# Patient Record
Sex: Female | Born: 1970 | Race: White | Hispanic: No | Smoking: Current every day smoker
Health system: Southern US, Community
[De-identification: ages and names within clinical notes are randomized; demographics above are authoritative.]

## PROBLEM LIST (undated history)

## (undated) ENCOUNTER — Inpatient Hospital Stay (HOSPITAL_COMMUNITY): Payer: Self-pay

## (undated) DIAGNOSIS — E119 Type 2 diabetes mellitus without complications: Secondary | ICD-10-CM

## (undated) DIAGNOSIS — B009 Herpesviral infection, unspecified: Secondary | ICD-10-CM

## (undated) DIAGNOSIS — I10 Essential (primary) hypertension: Secondary | ICD-10-CM

## (undated) DIAGNOSIS — E7800 Pure hypercholesterolemia, unspecified: Secondary | ICD-10-CM

## (undated) DIAGNOSIS — I219 Acute myocardial infarction, unspecified: Secondary | ICD-10-CM

## (undated) DIAGNOSIS — R768 Other specified abnormal immunological findings in serum: Secondary | ICD-10-CM

## (undated) DIAGNOSIS — F419 Anxiety disorder, unspecified: Secondary | ICD-10-CM

## (undated) DIAGNOSIS — F17200 Nicotine dependence, unspecified, uncomplicated: Secondary | ICD-10-CM

## (undated) DIAGNOSIS — F329 Major depressive disorder, single episode, unspecified: Secondary | ICD-10-CM

## (undated) HISTORY — DX: Acute myocardial infarction, unspecified: I21.9

## (undated) HISTORY — DX: Pure hypercholesterolemia, unspecified: E78.00

## (undated) HISTORY — PX: FOOT SURGERY: SHX648

## (undated) HISTORY — DX: Nicotine dependence, unspecified, uncomplicated: F17.200

## (undated) HISTORY — DX: Anxiety disorder, unspecified: F41.9

## (undated) HISTORY — PX: BLADDER SUSPENSION: SHX72

## (undated) HISTORY — DX: Major depressive disorder, single episode, unspecified: F32.9

## (undated) HISTORY — PX: CORONARY ANGIOPLASTY WITH STENT PLACEMENT: SHX49

## (undated) HISTORY — DX: Essential (primary) hypertension: I10

## (undated) HISTORY — DX: Herpesviral infection, unspecified: B00.9

## (undated) HISTORY — DX: Other specified abnormal immunological findings in serum: R76.8

## (undated) HISTORY — DX: Type 2 diabetes mellitus without complications: E11.9

## (undated) HISTORY — PX: CERVICAL FUSION: SHX112

---
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
## 2004-08-30 HISTORY — PX: CHOLECYSTECTOMY: SHX55

## 2009-12-12 ENCOUNTER — Ambulatory Visit: Payer: Self-pay | Admitting: Family Medicine

## 2009-12-12 DIAGNOSIS — I10 Essential (primary) hypertension: Secondary | ICD-10-CM

## 2009-12-12 DIAGNOSIS — F3289 Other specified depressive episodes: Secondary | ICD-10-CM

## 2009-12-12 DIAGNOSIS — F329 Major depressive disorder, single episode, unspecified: Secondary | ICD-10-CM

## 2009-12-12 DIAGNOSIS — E1159 Type 2 diabetes mellitus with other circulatory complications: Secondary | ICD-10-CM | POA: Insufficient documentation

## 2009-12-12 DIAGNOSIS — S93409A Sprain of unspecified ligament of unspecified ankle, initial encounter: Secondary | ICD-10-CM | POA: Insufficient documentation

## 2009-12-12 DIAGNOSIS — F339 Major depressive disorder, recurrent, unspecified: Secondary | ICD-10-CM | POA: Insufficient documentation

## 2009-12-12 DIAGNOSIS — F17200 Nicotine dependence, unspecified, uncomplicated: Secondary | ICD-10-CM | POA: Insufficient documentation

## 2009-12-12 HISTORY — DX: Other specified depressive episodes: F32.89

## 2009-12-12 HISTORY — DX: Nicotine dependence, unspecified, uncomplicated: F17.200

## 2009-12-12 HISTORY — DX: Major depressive disorder, single episode, unspecified: F32.9

## 2009-12-12 HISTORY — DX: Essential (primary) hypertension: I10

## 2009-12-15 ENCOUNTER — Telehealth: Payer: Self-pay | Admitting: Family Medicine

## 2010-01-12 ENCOUNTER — Ambulatory Visit: Payer: Self-pay | Admitting: Family Medicine

## 2010-01-16 ENCOUNTER — Ambulatory Visit: Payer: Self-pay | Admitting: Family Medicine

## 2010-01-16 LAB — CONVERTED CEMR LAB
ALT: 18 units/L (ref 0–35)
Albumin: 3.3 g/dL — ABNORMAL LOW (ref 3.5–5.2)
Alkaline Phosphatase: 85 units/L (ref 39–117)
BUN: 12 mg/dL (ref 6–23)
Bilirubin Urine: NEGATIVE
CO2: 27 meq/L (ref 19–32)
Direct LDL: 140.6 mg/dL
Eosinophils Absolute: 0.2 10*3/uL (ref 0.0–0.7)
Eosinophils Relative: 2.8 % (ref 0.0–5.0)
GFR calc non Af Amer: 103.29 mL/min (ref >60)
Glucose, Urine, Semiquant: NEGATIVE
HDL: 21.5 mg/dL — ABNORMAL LOW (ref >39.00)
Lymphocytes Relative: 28.6 % (ref 12.0–46.0)
Lymphs Abs: 2.1 10*3/uL (ref 0.7–4.0)
Monocytes Absolute: 0.5 10*3/uL (ref 0.1–1.0)
Neutro Abs: 4.4 10*3/uL (ref 1.4–7.7)
Neutrophils Relative %: 60.2 % (ref 43.0–77.0)
Nitrite: NEGATIVE
Potassium: 4.1 meq/L (ref 3.5–5.1)
Protein, U semiquant: NEGATIVE
RBC: 4.46 M/uL (ref 3.87–5.11)
RDW: 13.8 % (ref 11.5–14.6)
Sodium: 140 meq/L (ref 135–145)
TSH: 1.7 microintl units/mL (ref 0.35–5.50)
WBC Urine, dipstick: NEGATIVE

## 2010-02-11 ENCOUNTER — Other Ambulatory Visit: Admission: RE | Admit: 2010-02-11 | Discharge: 2010-02-11 | Payer: Self-pay | Admitting: Family Medicine

## 2010-02-11 ENCOUNTER — Ambulatory Visit: Payer: Self-pay | Admitting: Family Medicine

## 2010-02-11 DIAGNOSIS — R7309 Other abnormal glucose: Secondary | ICD-10-CM | POA: Insufficient documentation

## 2010-02-18 LAB — CONVERTED CEMR LAB: Pap Smear: NEGATIVE

## 2010-02-20 ENCOUNTER — Telehealth: Payer: Self-pay | Admitting: Family Medicine

## 2010-03-06 ENCOUNTER — Telehealth: Payer: Self-pay | Admitting: Family Medicine

## 2010-03-30 ENCOUNTER — Ambulatory Visit: Payer: Self-pay | Admitting: Family Medicine

## 2010-03-30 DIAGNOSIS — N920 Excessive and frequent menstruation with regular cycle: Secondary | ICD-10-CM | POA: Insufficient documentation

## 2010-03-30 LAB — CONVERTED CEMR LAB
Blood Glucose, Fingerstick: 121
Hemoglobin: 15.4 g/dL

## 2010-04-03 ENCOUNTER — Encounter: Admission: RE | Admit: 2010-04-03 | Discharge: 2010-04-03 | Payer: Self-pay | Admitting: Family Medicine

## 2010-04-07 ENCOUNTER — Telehealth: Payer: Self-pay | Admitting: Family Medicine

## 2010-04-08 ENCOUNTER — Ambulatory Visit: Payer: Self-pay | Admitting: Family Medicine

## 2010-04-08 DIAGNOSIS — R1084 Generalized abdominal pain: Secondary | ICD-10-CM | POA: Insufficient documentation

## 2010-04-08 LAB — CONVERTED CEMR LAB
Bilirubin Urine: NEGATIVE
Specific Gravity, Urine: 1.025
Urobilinogen, UA: 0.2

## 2010-04-09 LAB — CONVERTED CEMR LAB
Basophils Absolute: 0 10*3/uL (ref 0.0–0.1)
Lymphocytes Relative: 32 % (ref 12.0–46.0)
Lymphs Abs: 2.2 10*3/uL (ref 0.7–4.0)
MCHC: 34.4 g/dL (ref 30.0–36.0)
Monocytes Absolute: 0.5 10*3/uL (ref 0.1–1.0)
Neutro Abs: 4 10*3/uL (ref 1.4–7.7)
Neutrophils Relative %: 58.1 % (ref 43.0–77.0)
Platelets: 205 10*3/uL (ref 150.0–400.0)
RDW: 13.7 % (ref 11.5–14.6)

## 2010-04-13 ENCOUNTER — Telehealth: Payer: Self-pay | Admitting: Family Medicine

## 2010-04-13 ENCOUNTER — Ambulatory Visit: Payer: Self-pay | Admitting: Family Medicine

## 2010-05-13 ENCOUNTER — Ambulatory Visit: Payer: Self-pay | Admitting: Family Medicine

## 2010-05-15 ENCOUNTER — Ambulatory Visit: Payer: Self-pay | Admitting: Family Medicine

## 2010-05-15 DIAGNOSIS — B009 Herpesviral infection, unspecified: Secondary | ICD-10-CM

## 2010-05-15 HISTORY — DX: Herpesviral infection, unspecified: B00.9

## 2010-05-15 LAB — CONVERTED CEMR LAB: Blood Glucose, Fingerstick: 129

## 2010-08-17 ENCOUNTER — Ambulatory Visit: Payer: Self-pay | Admitting: Family Medicine

## 2010-09-29 NOTE — Progress Notes (Signed)
Summary: abd cramps  Phone Note Call from Patient   Summary of Call: Abdominal cramps continue & are worse today.  No bleeding.  Feels like sever menstrual cramps hip to hip & pelvic area.  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  Use OTC pain med & ice pack as needed.  ER or UC this pm as needed worsening symptoms.   Initial call taken by: Rudy Jew, RN,  April 07, 2010 3:08 PM

## 2010-09-29 NOTE — Progress Notes (Signed)
Summary: xray results.  Phone Note Call from Patient   Caller: Patient Call For: Evelena Peat MD Summary of Call: Pt is calling for xray results. [phone] Initial call taken by: Lynann Beaver CMA,  December 15, 2009 10:48 AM  Follow-up for Phone Call        see comments after x-ray report. Follow-up by: Evelena Peat MD,  December 15, 2009 1:13 PM  Additional Follow-up for Phone Call Additional follow up Details #1::        Pt given results and Dr. Lucie Leather recommendations. Additional Follow-up by: Lynann Beaver CMA,  December 15, 2009 1:22 PM

## 2010-09-29 NOTE — Progress Notes (Signed)
Summary: concerned about BS  Phone Note Call from Patient   Caller: Patient Call For: Lisa Peat MD Summary of Call: Discussed with pt ways to get BS under control  with diet, exercise and life style changes.  BS has run as high as 179. Initial call taken by: Lynann Beaver CMA,  March 06, 2010 10:03 AM  Follow-up for Phone Call        have pt return for repeat fasting CBG within 2-3 months. Follow-up by: Lisa Peat MD,  March 06, 2010 10:30 AM  Additional Follow-up for Phone Call Additional follow up Details #1::        Pt has appt 04/2010. Additional Follow-up by: Lynann Beaver CMA,  March 06, 2010 11:40 AM

## 2010-09-29 NOTE — Assessment & Plan Note (Signed)
Summary: FLU SHOT//ALP  Nurse Visit   Allergies: No Known Drug Allergies  Orders Added: 1)  Admin 1st Vaccine [90471] 2)  Flu Vaccine 34yrs + [45409]        Flu Vaccine Consent Questions     Do you have a history of severe allergic reactions to this vaccine? no    Any prior history of allergic reactions to egg and/or gelatin? no    Do you have a sensitivity to the preservative Thimersol? no    Do you have a past history of Guillan-Barre Syndrome? no    Do you currently have an acute febrile illness? no    Have you ever had a severe reaction to latex? no    Vaccine information given and explained to patient? yes    Are you currently pregnant? no    Lot Number:[id number]   Exp Date:02/27/2011   Site Given  Left Deltoid IM

## 2010-09-29 NOTE — Progress Notes (Signed)
Summary: LMTCB for appt.  Phone Note Call from Patient Call back at [facility] Phone [phone]   Caller: Patient Call For: Evelena Peat MD Summary of Call: Pt is calling to let Dr.Burchette know she is having abdomial pain.  Called back to schedule an appt but no answer.  Left message. [phone] Initial call taken by: Lynann Beaver CMA,  April 13, 2010 8:42 AM  Follow-up for Phone Call        Pt called back and I sch her an ov to see Dr. Caryl Never, for today 04/13/10 at 4:00pm as noted above. That was the earliest the pt could come in for appt.   Follow-up by: Lucy Antigua,  April 13, 2010 9:38 AM

## 2010-09-29 NOTE — Assessment & Plan Note (Signed)
Summary: 1 month follow up/cjr   Vital Signs:  Patient profile:   40 year old female Menstrual status:  irregular Weight:      242 pounds Temp:     98.4 degrees F oral BP sitting:   110 / 68  (left arm) Cuff size:   large  Vitals Entered By: Sid Falcon LPN (Jan 12, 2010 10:16 AM) CC: one month follow-up   History of Present Illness: Patient seen for followup. History of hypertension and we stopped her lisinopril last visit. Blood pressures have maintained good control.  Other issues recent right ankle sprain. This is improving slowly. X-rays unremarkable. Still some mild edema. No limp. Using ace wrap for stability.  Patient has not had complete physical examination in several years. She has questions today regarding lap band surgery.  Allergies (verified): No Known Drug Allergies  Past History:  Past Medical History: Last updated: 12/12/2009 Depression Hypertension  Physical Exam  General:  Well-developed,well-nourished,in no acute distress; alert,appropriate and cooperative throughout examination Mouth:  Oral mucosa and oropharynx without lesions or exudates.  Teeth in good repair. Neck:  No deformities, masses, or tenderness noted. Lungs:  Normal respiratory effort, chest expands symmetrically. Lungs are clear to auscultation, no crackles or wheezes. Heart:  Normal rate and regular rhythm. S1 and S2 normal without gallop, murmur, click, rub or other extra sounds. Extremities:  R ankle full ROM and nontender.  No edema.   Impression & Recommendations:  Problem # 1:  HYPERTENSION (ICD-401.9) stable off medication. Continue to observe.  Work on weight loss. The following medications were removed from the medication list:    Lisinopril 5 Mg Tabs (Lisinopril) ..... Once daily  Problem # 2:  ANKLE SPRAIN (ICD-845.00) Assessment: Improved she had some slow gradual improvement. This should continue to heal without complication.  Problem # 3:  Preventive Health Care  (ICD-V70.0) needs CPE and will schedule.  Complete Medication List: 1)  Chantix Starting Month Pak 0.5 Mg X 11 & 1 Mg X 42 Tabs (Varenicline tartrate) .... Use as directed. 2)  Chantix Continuing Month Pak 1 Mg Tabs (Varenicline tartrate) .... Use as directed  Patient Instructions: 1)  scheduled complete physical examination.

## 2010-09-29 NOTE — Assessment & Plan Note (Signed)
Summary: ABD CRAMPS/PS   Vital Signs:  Patient profile:   40 year old female Menstrual status:  irregular LMP:     03/09/2010 Weight:      233 pounds Temp:     98.1 degrees F oral Pulse rate:   72 / minute BP sitting:   110 / 72  (left arm) Cuff size:   large  Vitals Entered By: Romualdo Bolk, CMA (AAMA) (April 08, 2010 9:54 AM) CC: Abdominal cramps from hip to hip. Hurts to breathe. Pt has been on cycle 7/11 and stopped on 8/6. Pt slept on a heating pad last night and has red spots where the heating pad was., Abdominal Pain LMP (date): 03/09/2010     Enter LMP: 03/09/2010 Last PAP Result NEGATIVE FOR INTRAEPITHELIAL LESIONS OR MALIGNANCY.   History of Present Illness:       This is a 40 year old woman who presents with Abdominal Pain.  The patient reports anorexia, but denies nausea, vomiting, diarrhea, constipation, melena, and hematochezia.  The location of the pain is right lower quadrant, left lower quadrant, and suprapubic.  The pain is described as constant and sharp.  The patient denies the following symptoms: fever, weight loss, dysuria, dark urine, and missed menstrual period.  The pain is worse with lying down and jarring of the abdomen.    Onset Saturday.  worse with deep breathing and direct pressure.  Tylenol without relief.  No urinary symptoms.  Pain 10/10 severity.  Recent menometrorrhagia resolved at this time.  Ultrasound was basically normal.  Prob sec to anovulatory cycles.  Preventive Screening-Counseling & Management  Alcohol-Tobacco     Alcohol drinks/day: 0     Smoking Status: current     Packs/Day: 0.5     Year Started: 1986  Caffeine-Diet-Exercise     Caffeine use/day: less than 1 a day     Does Patient Exercise: yes  Current Medications (verified): 1)  Chantix Starting Month Pak 0.5 Mg X 11 & 1 Mg X 42 Tabs (Varenicline Tartrate) .... Use As Directed. 2)  Chantix Continuing Month Pak 1 Mg Tabs (Varenicline Tartrate) .... 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  Teeth in good repair. Lungs:  Normal respiratory effort, chest expands symmetrically. Lungs are clear to auscultation, no crackles or wheezes. Heart:  normal rate and regular rhythm.   Abdomen:  soft, normal bowel sounds, no distention, no masses, no rigidity, no rebound tenderness, no hepatomegaly, and no splenomegaly.  mild to moderate tender R and L lower quadrant. Skin:  no rashes.     Impression & Recommendations:  Problem # 1:  ABDOMINAL PAIN, GENERALIZED (ICD-789.07) Assessment New check labs as below.  No real localizing pain at this time.  she will watch for fever or localizing pain. Orders: UA Dipstick w/o Micro (manual) (47829) Specimen Handling (99000) Venipuncture (56213) TLB-CBC Platelet - w/Differential (85025-CBCD)  Complete Medication List: 1)  Chantix Starting Month Pak 0.5 Mg X 11 & 1 Mg X 42  Tabs (Varenicline tartrate) .... Use as directed. 2)  Chantix Continuing Month Pak 1 Mg Tabs (Varenicline tartrate) .... Use as directed  Patient Instructions: 1)  Call or follow up immediately for fever, vomiting, localizing pain.  Laboratory Results   Urine Tests    Routine Urinalysis   Color: yellow Appearance: Clear Glucose: negative   (Normal Range: Negative) Bilirubin: negative   (Normal Range: Negative) Ketone: negative   (Normal Range: Negative) Spec. Gravity: 1.025   (Normal Range: 1.003-1.035) Blood: 1+   (Normal Range: Negative) pH: 5.0   (Normal Range: 5.0-8.0) Protein: negative   (Normal Range: Negative) Urobilinogen: 0.2   (Normal Range: 0-1) Nitrite: negative   (Normal Range: Negative) Leukocyte Esterace: negative   (Normal Range: Negative)    Comments: Rita Ohara  April 08, 2010 1:44 PM

## 2010-09-29 NOTE — Assessment & Plan Note (Signed)
Summary: BRAND NEW PT/TO EST/?FRACTURE OF FOOT/PER DR Rayann Jolley/CJR   Vital Signs:  Patient profile:   40 year old female Menstrual status:  irregular LMP:     06/30/2009 Height:      62.50 inches Weight:      239 pounds BMI:     43.17 Temp:     98.2 degrees F oral Pulse rate:   80 / minute Pulse rhythm:   regular Resp:     12 per minute BP sitting:   110 / 70  (left arm) Cuff size:   large  Vitals Entered By: Sid Falcon LPN (December 12, 2009 1:20 PM)  Nutrition Counseling: Patient's BMI is greater than 25 and therefore counseled on weight management options. CC: New to establish, right anlke inj 1 month ago, Hypertension Management LMP (date): 06/30/2009     Menstrual Status irregular Enter LMP: 06/30/2009   History of Present Illness: New patient to establish care.  Patient has history of hypertension had been treated with low-dose lisinopril 5 mg daily but ran out some time ago blood pressures been stable. Denies any recent dizziness palpitation or chest pain. Occasional headaches.  Prior history of depression. Currently off medication. Depression symptoms stable.  New problem of right ankle injury approximately one and 1/2 months ago. Coming off a step with an inversion injury to ankle. Initially had some swelling and bruising. Bruising has resolved but has some tenderness mostly medial aspect of ankle now. Still has some mild leg edema. No significant instability. No prior history of ankle surgery.  Past medical history otherwise significant for cholecystectomy 2006 and adenoidectomy 1988. No known drug allergies.  Family history significant for parents with hyperlipidemia. Grandparent with stroke.  Smokes one half pack cigarettes per day. Desires to quit. Has questions regarding Chantix. Tried Wellbutrin in the past without much success. No alcohol use. 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  She notes no problems with any antihypertensive medication side effects.        Positive major cardiovascular risk factors include hypertension and current tobacco user.  Negative major cardiovascular risk factors include female age less than 30 years old.     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 Alcohol use-no Regular exercise-yes one pregnancy, ond live birth  Risk Factors: Exercise: yes (12/12/2009)  Risk Factors: Smoking Status: current (12/12/2009) Packs/Day: 1.5 (12/12/2009)  Past Medical History: Depression Hypertension  Past Surgical History: Cholecystectomy  2006 Tonsillectomy  1988  Family History: Family History High cholesterol Stroke  Social History: Occupation:  Psychologist, [employer] Married Current Smoker Alcohol use-no Regular exercise-yes one pregnancy, ond live birth Occupation:  employed Smoking Status:  current Does Patient Exercise:  yes Packs/Day:  1.5  Review of Systems  The patient denies anorexia, fever, weight loss, weight gain, chest pain, syncope, dyspnea on exertion, peripheral edema, prolonged cough, headaches, hemoptysis, abdominal pain, melena, hematochezia, severe indigestion/heartburn, incontinence, muscle weakness, and difficulty walking.    Physical  Exam  General:  Well-developed,well-nourished,in no acute distress; alert,appropriate and cooperative throughout examination Head:  Normocephalic and atraumatic without  obvious abnormalities. No apparent alopecia or balding. Eyes:  pupils equal, pupils round, and pupils reactive to light.   Ears:  External ear exam shows no significant lesions or deformities.  Otoscopic examination reveals clear canals, tympanic membranes are intact bilaterally without bulging, retraction, inflammation or discharge. Hearing is grossly normal bilaterally. Mouth:  Oral mucosa and oropharynx without lesions or exudates.  Teeth in good repair. Neck:  No deformities, masses, or tenderness noted. Lungs:  Normal respiratory effort, chest expands symmetrically. Lungs are clear to auscultation, no crackles or wheezes. Heart:  Normal rate and regular rhythm. S1 and S2 normal without gallop, murmur, click, rub or other extra sounds. Pulses:  R dorsalis pedis normal and L dorsalis pedis normal.   Extremities:  no peripheral edema noted. Right ankle and foot examined. She has some tenderness near the attachment of the posterior tibial tendon but no distal tibia or fibula tenderness. Good range of motion ankle. No other bony tenderness. No Achilles tenderness. Achilles Tendon fully intact Neurologic:  alert & oriented X3 and strength normal in all extremities.   Skin:  Intact without suspicious lesions or rashes Cervical Nodes:  No lymphadenopathy noted Psych:  normally interactive, good eye contact, not anxious appearing, and not depressed appearing.     Impression & Recommendations:  Problem # 1:  HYPERTENSION (ICD-401.9) Assessment Unchanged  Her updated medication list for this problem includes:    Lisinopril 5 Mg Tabs (Lisinopril) ..... Once daily  Problem # 2:  ANKLE SPRAIN (ICD-845.00) Assessment: New ?post tibial tendon strain.  No signs of instability.  Obtain plain films. Orders: Ace Wraps 3-5 in/yard  (Z6109) T-Ankle Comp Right (73610TC)  Problem # 3:  TOBACCO ABUSE (ICD-305.1)  discussed smoking cessation strategies. Patient interested in trial of Chantix and this is  prescribed pt knows to f/u immediately if any depressive symptoms.  Her updated medication list for this problem includes:    Chantix Starting Month Pak 0.5 Mg X 11 & 1 Mg X 42 Tabs (Varenicline tartrate) ..... Use as directed.    Chantix Continuing Month Pak 1 Mg Tabs (Varenicline tartrate) ..... Use as directed  Problem # 4:  DEPRESSION (ICD-311) Assessment: Unchanged hx of.  Currently stable.  Complete Medication List: 1)  Lisinopril 5 Mg Tabs (Lisinopril) .... Once daily 2)  Chantix Starting Month Pak 0.5 Mg X 11 & 1 Mg X 42 Tabs (Varenicline tartrate) .... Use as directed. 3)  Chantix Continuing Month Pak 1 Mg Tabs (Varenicline tartrate) .... Use as directed  Hypertension Assessment/Plan:      The patient's hypertensive risk group is category B: At least one risk factor (excluding diabetes) with no target organ damage.  Today's blood pressure is 110/70.    Patient Instructions: 1)  Please schedule a follow-up appointment in 1 month.  2)  Stop smoking tips: Choose a quit date. Cut down before the quit date. Decide what you will do as a substitute when you feel the urge to smoke(gum, toothpick, exercise).  Prescriptions: CHANTIX CONTINUING MONTH PAK 1 MG TABS (VARENICLINE TARTRATE) use as directed  #1 x 1   Entered and Authorized by:   Evelena Peat MD   Signed by:   Evelena Peat MD on 12/12/2009   Method used:   Electronically to        [facility] [phone]* (retail)       [address] [place], [place]  [address]       Ph: [phone] or [phone]       Fax: [phone]   RxID:   [id number] CHANTIX STARTING MONTH PAK 0.5 MG X 11 & 1 MG X 42 TABS (VARENICLINE TARTRATE) use as directed.  #1 x 0   Entered and Authorized by:   Evelena Peat MD   Signed by:   Evelena Peat MD on 12/12/2009   Method used:   Electronically to        [facility] [phone]* (retail)       [address] [place], [place]  [address]       Ph: [phone] or [phone]       Fax: [phone]   RxID:   [id number]

## 2010-09-29 NOTE — Assessment & Plan Note (Signed)
Summary: 3 month rov/pt will come in fasting/njr/PT RESCD//CCM   Vital Signs:  Patient profile:   40 year old female Menstrual status:  irregular Weight:      234 pounds Temp:     98.2 degrees F oral BP sitting:   140 / 80  (left arm) Cuff size:   large  Vitals Entered By: Sid Falcon LPN (May 15, 2010 10:11 AM) CBG Result 129   History of Present Illness: Here for follow up.  Abd pain resolved from last visit.  Never took Bentyl. BP borderline control at times.  Has been trying to lose weight.  Glucose in prediabetes range recently. Had been losing weight but compliance has fallen off recently. No symptoms of hyperglycemia.  Menses regular at this time.  She has hx of amenorrhea. We had discussed possible use of Provera if recurrent heavy and prolonged bleed. Recent ultrasound no signif abnormality.  Recent cold sores on lips. Would like to consider Valtrex for early treatment.  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    Physical  Exam  General:  Well-developed,well-nourished,in no acute distress; alert,appropriate and cooperative throughout examination Mouth:  Oral mucosa and oropharynx without lesions or exudates.  Teeth in good repair. Neck:  No deformities, masses, or tenderness noted. Lungs:  Normal respiratory effort, chest expands symmetrically. Lungs are clear to auscultation, no crackles or wheezes. Heart:  Normal rate and regular rhythm. S1 and S2 normal without gallop, murmur, click, rub or other extra sounds. Abdomen:  soft and non-tender.   Extremities:  no edema.   Impression & Recommendations:  Problem # 1:  PREDIABETES (ICD-790.29) pt needs to cont to lose some weight.  CBG 129.  Will plan repeat in 3 months and A1C then. Start metformin at that time if no further improvement.  Home glucose monitor is given with instructions for use.  Problem # 2:  HYPERTENSION (ICD-401.9)  Problem # 3:  COLD SORE (ICD-054.9) Valtrex for use on as needed basis.  Problem # 4:  ABDOMINAL PAIN, GENERALIZED (ICD-789.07) Assessment: Improved  Problem # 5:  MENORRHAGIA (ICD-626.2) Assessment: Improved  Complete Medication List: 1)  Chantix Starting Month Pak 0.5 Mg X 11 & 1 Mg X 42 Tabs (Varenicline tartrate) .... Use as directed. 2)  Chantix Continuing Month Pak 1 Mg Tabs (Varenicline tartrate) .... Use as directed 3)  Bentyl 20 Mg Tabs (Dicyclomine hcl) .... One by mouth two times a day as needed abdominal pain and cramping 4)  Valtrex 1 Gm Tabs (Valacyclovir hcl) .... Use as directed as needed cold sores. 5)  Onetouch Ultra System W/device Kit (Blood glucose monitoring suppl) .... Dispensed 05/15/2010 use daily as directed 6)  Onetouch Ultrasoft Lancets Misc (Lancets) .... Use daily + as needed 7)  Onetouch Ultra Blue  Strp (Glucose blood) .... Use daily + as needed  Other Orders: Capillary Blood Glucose/CBG (66440)  Patient Instructions: 1)  It is important that you exercise reguarly at least 20 minutes 5  times a week. If you develop chest pain, have severe difficulty breathing, or feel very tired, stop exercising immediately and seek medical attention.  2)  You need to lose weight. Consider a lower calorie diet and regular exercise.  3)  Take Valtrex 1gm 2 at onset of cold sore and repeat 2 in 12 hours. 4)  Please schedule a follow-up appointment in 3 months .  Prescriptions: ONETOUCH ULTRA BLUE  STRP (GLUCOSE BLOOD) use daily + as needed  #50 x 1   Entered by:   Sid Falcon LPN   Authorized by:   Evelena Peat MD   Signed by:   Sid Falcon LPN on 34/74/2595   Method used:   Electronically to        [facility] [phone]* (retail)       [address] [place], [place]  [address]       Ph: [phone] or [phone]       Fax: [phone]   RxID:   [id number] [employer] LANCETS  MISC (LANCETS) use daily as directed  #50 x 1   Entered by:   Sid Falcon LPN   Authorized by:   Evelena Peat MD   Signed by:   Sid Falcon LPN on 17/61/6073   Method used:   Electronically to        [facility] [phone]* (retail)       [address] [place], [place]  [address]       Ph: [phone] or [phone]       Fax: [phone]   RxID:   [id number] VALTREX 1 GM TABS (VALACYCLOVIR HCL) use as directed as needed cold sores.  #30 x 1   Entered and Authorized by:   Evelena Peat MD   Signed by:   Evelena Peat MD on 05/15/2010   Method used:   Print then Give to Patient   RxID:   [id number]

## 2010-09-29 NOTE — Assessment & Plan Note (Signed)
Summary: abdominal pain/cjr   Vital Signs:  Patient profile:   40 year old female Menstrual status:  irregular Temp:     98.0 degrees F oral BP sitting:   118 / 72  (left arm) Cuff size:   large  Vitals Entered By: Sid Falcon LPN (April 13, 2010 4:25 PM) CC: abd pain ongoing   History of Present Illness: Persistent lower abdominal pain which is diffuse and intermittent. Refer to previous note.  Pain is dull ache to sharp and severe at times. Recent CBC normal.  Had recent pelvic ultrasound basically normal.  No urinary symptoms and no fever.  Does have some loose stools but no bloody stools and no  diarrhea.  Pain is worse with standing. No radiation to back.  No appetite change. Losing some weight due to her efforts.  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  Teeth in good repair. Lungs:  Normal respiratory effort, chest expands symmetrically. Lungs are clear to auscultation, no crackles or wheezes. Heart:  normal rate and regular rhythm.   Abdomen:  minimal tender R and  L lower quadrants. No guarding or rebound.  no hepatomegaly.soft, normal bowel sounds, no distention, no masses, and no rigidity.   Skin:  no rashes.     Impression & Recommendations:  Problem # 1:  ABDOMINAL PAIN, GENERALIZED (ICD-789.07) no acute abdomen.  Recent CBC unremarkable.  Trial of Bentyl and consider CT of abdomen if no better in next couple of days.  Complete Medication List: 1)  Chantix Starting Month Pak 0.5 Mg X 11 & 1 Mg X 42 Tabs (Varenicline tartrate) .... Use as directed. 2)  Chantix Continuing Month Pak 1 Mg Tabs (Varenicline tartrate) .... Use as directed 3)  Bentyl 20 Mg Tabs (Dicyclomine hcl) .... One by mouth two times a day as needed abdominal pain and cramping  Patient Instructions: 1)  Follow up promptly if you have any fever, vomiting, or worsening pain Prescriptions: BENTYL 20 MG TABS (DICYCLOMINE HCL) one by mouth two times a day as needed abdominal pain and cramping  #20 x 0   Entered and Authorized by:   Evelena Peat MD   Signed by:   Evelena Peat MD on 04/13/2010   Method used:   Electronically to        [facility] [phone]* (retail)       [address] [place], [place]  [address]       Ph: [phone] or [phone]       Fax: [phone]   RxID:   [id number]

## 2010-09-29 NOTE — Assessment & Plan Note (Signed)
Summary: cpx/pap/breast exam/njr----PT Lisa Norton // RS pt rsc/njr   Vital Signs:  Patient profile:   40 year old female Menstrual status:  irregular Height:      62.5 inches Weight:      238 pounds Temp:     98.0 degrees F oral Pulse rate:   71 / minute Pulse rhythm:   regular Resp:     12 per minute BP sitting:   120 / 74  (left arm) Cuff size:   large  Vitals Entered By: Sid Falcon LPN (February 11, 2010 9:14 AM) CC: OPX   History of Present Illness: Here for CPE.  Overweight most of her life.  Interested in gastric bypass.  Has tried multiple weight loss programs without success.  Has lost 4 pounds over the past month by eliminating colas in her diet.  Last tetanus unknown.  No pap in 3 or more years.  FH signif for mother CVA in her 41s.  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 Alcohol use-no Regular exercise-yes one pregnancy, ond live birth  Risk Factors: Exercise: yes (12/12/2009)  Risk Factors: Smoking Status: current (12/12/2009) Packs/Day: 1.5 (12/12/2009) PMH-FH-SH reviewed for relevance  Family History: Family History High cholesterol Stroke  Mother 84s Biological Father unknown  Review of Systems  The patient denies anorexia, fever, weight loss, weight gain, vision loss, decreased hearing, hoarseness, chest pain, syncope, dyspnea on exertion, peripheral edema, prolonged cough, headaches, hemoptysis, abdominal pain, melena, hematochezia, severe indigestion/heartburn, hematuria, incontinence, genital sores, muscle weakness, suspicious skin lesions,  transient blindness, difficulty walking, depression, unusual weight change, abnormal bleeding, enlarged lymph nodes, and breast masses.    Physical Exam  General:  Well-developed,well-nourished,in no acute distress; alert,appropriate and cooperative throughout examination Head:  Normocephalic and atraumatic without obvious abnormalities. No apparent alopecia or balding. Eyes:  No corneal or conjunctival inflammation noted. EOMI. Perrla. Funduscopic exam benign, without hemorrhages, exudates or papilledema. Vision grossly normal. Ears:  External ear exam shows no significant lesions or deformities.  Otoscopic examination reveals clear canals, tympanic membranes are intact bilaterally without bulging, retraction, inflammation or discharge. Hearing is grossly normal bilaterally. Mouth:  Oral mucosa and oropharynx without lesions or exudates.  Teeth in good repair. Neck:  No deformities, masses, or tenderness noted. Breasts:  No mass, nodules, thickening, tenderness, bulging, retraction, inflamation, nipple discharge or skin changes noted.   Lungs:  Normal respiratory effort, chest expands symmetrically. Lungs are clear to auscultation, no crackles or wheezes. Heart:  Normal rate and regular rhythm. S1 and S2 normal without gallop, murmur, click, rub or other extra sounds. Abdomen:  Bowel sounds positive,abdomen soft and non-tender without masses, organomegaly or hernias noted. Genitalia:  Normal introitus for age, no external lesions, no vaginal discharge, mucosa pink and moist, no vaginal or cervical lesions, no vaginal atrophy, no friaility or hemorrhage, normal uterus size and position, no adnexal masses or tenderness Msk:  No deformity or scoliosis noted of thoracic or lumbar spine.   Extremities:  No clubbing, cyanosis, edema, or deformity noted with normal full range of motion of all joints.   Neurologic:  alert & oriented X3, cranial nerves II-XII intact, and strength normal in all extremities.  Skin:  no rashes and no suspicious lesions.   Cervical Nodes:  No lymphadenopathy noted Psych:  Cognition and judgment appear intact. Alert and cooperative with normal attention span and concentration. No apparent delusions, illusions, hallucinations   Impression & Recommendations:  Problem # 1:  Preventive Health Care (ICD-V70.0) Obesity, Prob metabolic syndrome (elev glucose, elev trig, low HDL,  increase waist).  Ongoing nicotine. work on weight loss and stop smoking.  May need to consider Niacin at some point.  Pap smear obtained.  Consider omega 3 supplement.  Tdap booster.  Complete Medication List: 1)  Chantix Starting Month Pak 0.5 Mg X 11 & 1 Mg X 42 Tabs (Varenicline tartrate) .... Use as directed. 2)  Chantix Continuing Month Pak 1 Mg Tabs (Varenicline tartrate) .... Use as directed  Other Orders: Pap Smear, Thin Prep ( Collection of) (W1191) Tdap => 59yrs IM (47829) Admin 1st Vaccine (56213)  Patient Instructions: 1)  Stop smoking tips: Choose a quit date. Cut down before the quit date. Decide what you will do as a substitute when you feel the urge to smoke(gum, toothpick, exercise).  2)  It is important that you exercise reguarly at least 20 minutes 5 times a week. If you develop chest pain, have severe difficulty breathing, or feel very tired, stop exercising immediately and seek medical attention.  3)  You need to lose weight. Consider a lower calorie diet and regular exercise.  4)  Reduce sugars and starches 5)  Consider Omega 3 supplement such as fish oil 2 to 3 grams/day. 6)  Please schedule a follow-up appointment in 3 months .  We will check your glucose again then so be fasting for this appt.    Immunizations Administered:  Tetanus Vaccine:    Vaccine Type: Tdap    Site: left deltoid    Mfr: GlaxoSmithKline    Dose: 0.5 ml    Route: IM    Given by: Sid Falcon LPN    Exp. Date: 11/21/2011    Lot #: [id number]

## 2010-09-29 NOTE — Letter (Signed)
Summary: Out of Work  Adult nurse at [employer]  [address]   [place], [place] [address]   Phone: [phone]  Fax: [phone]    April 13, 2010   Employee:  Raphaela AHRENS    To Whom It May Concern:   For Medical reasons, please excuse the above named employee from work for the following dates:  Start:   04/13/2010  End:   04/13/2010  If you need additional information, please feel free to contact our office.         Sincerely,       Evelena Peat, MD

## 2010-09-29 NOTE — Progress Notes (Signed)
Summary: Pt req pap results. Pls call at new number.  Phone Note Call from Patient Call back at [facility] Phone [phone]   Summary of Call: Pt called and is req pap results. Pt called and gave correct phone number.   [phone]    Initial call taken by: Lucy Antigua,  February 20, 2010 8:05 AM  Follow-up for Phone Call        Pt informed on new VM Follow-up by: Sid Falcon LPN,  February 20, 2010 10:34 AM

## 2010-09-29 NOTE — Assessment & Plan Note (Signed)
Summary: menorrhagia/dm   Vital Signs:  Patient profile:   40 year old female Menstrual status:  irregular Weight:      233 pounds Temp:     98.7 degrees F BP sitting:   130 / 80  (left arm) Cuff size:   large  Vitals Entered By: Sid Falcon LPN (March 30, 2010 9:19 AM)  History of Present Illness: Patient is seen with some prolonged menstrual spotting since 11 July. She relates that most of her adult life she has had irregular menstrual cycles. She has history of obesity. Recent thyroid functions normal. She had not had menstrual cycle for several months until 11 July. She has been using about 5-6 pads per day. She has some occasional lower abdominal cramping. Is attempting to lose weight without much success.  No aspirin use.  Recent Pap smear and pelvic exam unremarkable. She continues to smoke. Denies dizziness or orthostatic symptoms.  No hx of fibroids.  Does not use birth control.  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    Physical Exam  General:  patient is pleasant obese in no distress Mouth:  Oral mucosa and oropharynx without lesions or exudates.  Teeth in good repair. Neck:   No deformities, masses, or tenderness noted. Lungs:  Normal respiratory effort, chest expands symmetrically. Lungs are clear to auscultation, no crackles or wheezes. Heart:  Normal rate and regular rhythm. S1 and S2 normal without gallop, murmur, click, rub or other extra sounds. Abdomen:  soft and non-tender.   Genitalia:  recent pelvic with CPE. Extremities:  no edema.   Impression & Recommendations:  Problem # 1:  MENORRHAGIA (ICD-626.2) Assessment New probably related to anovulatory cycles related to obesity. Check pregnancy test. Check hemoglobin.  Order pelvic ultrasound. Patient not a candidate for oral contraceptive pills given her smoking status and age over 60.  May need to consider Gyn referral. Orders: Hgb (91478) Urine Pregnancy Test  (29562) Fingerstick (13086) Radiology Referral (Radiology)  Problem # 2:  PREDIABETES (ICD-790.29) recheck glucose.  Discussed weight loss strategies. Orders: Glucose, (CBG) (57846) Fingerstick (96295)  Complete Medication List: 1)  Chantix Starting Month Pak 0.5 Mg X 11 & 1 Mg X 42 Tabs (Varenicline tartrate) .... Use as directed. 2)  Chantix Continuing Month Pak 1 Mg Tabs (Varenicline tartrate) .... Use as directed  Patient Instructions: 1)  It is important that you exercise reguarly at least 20 minutes 5 times a week. If you develop chest pain, have severe difficulty breathing, or feel very tired, stop exercising immediately and seek medical attention.  2)  You need to lose weight. Consider a lower calorie diet and regular exercise.   Laboratory Results   Urine Tests  Date/Time Recieved: March 30, 2010 11:10 AM  Date/Time Reported: March 30, 2010 11:10 AM     Urine HCG: negative Comments: Wynona Canes, CMA  March 30, 2010 11:10 AM   Blood Tests   Date/Time Recieved: March 30, 2010 10:16 AM  Date/Time Reported: March 30, 2010 10:16 AM   CBG Random: 121  CBC HGB:  15.4 g/dL   (Normal Range: 16.1-09.6 in  Males, 12.0-15.0 in Females) Comments: Wynona Canes, CMA  March 30, 2010 10:16 AM

## 2010-10-06 ENCOUNTER — Encounter: Payer: Self-pay | Admitting: Family Medicine

## 2010-10-06 ENCOUNTER — Telehealth: Payer: Self-pay | Admitting: *Deleted

## 2010-10-06 ENCOUNTER — Ambulatory Visit (INDEPENDENT_AMBULATORY_CARE_PROVIDER_SITE_OTHER): Payer: BC Managed Care – PPO | Admitting: Family Medicine

## 2010-10-06 ENCOUNTER — Telehealth: Payer: Self-pay | Admitting: Family Medicine

## 2010-10-06 ENCOUNTER — Other Ambulatory Visit: Payer: Self-pay | Admitting: Family Medicine

## 2010-10-06 VITALS — BP 120/78 | Temp 98.3°F | Ht 63.0 in | Wt 240.0 lb

## 2010-10-06 DIAGNOSIS — E119 Type 2 diabetes mellitus without complications: Secondary | ICD-10-CM

## 2010-10-06 LAB — GLUCOSE, POCT (MANUAL RESULT ENTRY): POC Glucose: 115

## 2010-10-06 LAB — HEMOGLOBIN A1C: Hgb A1c MFr Bld: 6.9 % — ABNORMAL HIGH (ref 4.6–6.5)

## 2010-10-06 MED ORDER — METFORMIN HCL 500 MG PO TABS: 500.0000 mg | ORAL_TABLET | Freq: Two times a day (BID) | ORAL | Status: DC

## 2010-10-06 NOTE — Telephone Encounter (Signed)
Pharmacist called needing clarification on test strips. Pls call back asap.

## 2010-10-06 NOTE — Telephone Encounter (Signed)
Pt testing once a day, and additional testing as needed, pharmacy informed

## 2010-10-06 NOTE — Progress Notes (Signed)
  Subjective:    Patient ID: Lisa Norton, female    DOB: [birth year], 40 y.o.   MRN: [id number]  HPI Patient is seen in followup of elevated blood sugar. Refer to prior note. Fasting blood sugar 129. Unfortunately, no weight loss since then she's had some weight gain. Does have some symptoms of increased thirst and minimal urine  frequency. Poor dietary compliance. Drinks sometimes several sodas per day. No regular exercise. Recent fasting blood sugar yesterday 218 with several recent fastings around 135.   Review of Systems    as above. Denies any rashes. No fever, chills, nasal congestion, cough, chest pains, dizziness Objective:   Physical Exam Patient is alert in no distress Oropharynx clear Eardrums normal Neck no mass Chest clear to auscultation Heart regular rhythm and rate with no murmur Extremity no edema no lesions       Assessment & Plan:  Type 2 diabetes, new onset  Start metformin 500 mg twice a day. Check hemoglobin A1c today. Diet and exercise discussed routine followup 3 months

## 2010-10-06 NOTE — Telephone Encounter (Signed)
Message copied by Sid Falcon on Tue Oct 06, 2010  4:44 PM ------      Message from: Trinna Post      Created: Tue Oct 06, 2010  1:41 PM       Mildly elevated A1C.  Pt started on metformin      Repeat in 3 months.

## 2010-10-06 NOTE — Telephone Encounter (Signed)
Pt informed on personally identified VM 

## 2010-10-12 ENCOUNTER — Ambulatory Visit: Payer: Self-pay | Admitting: Family Medicine

## 2010-10-23 ENCOUNTER — Telehealth: Payer: Self-pay | Admitting: Family Medicine

## 2010-10-23 NOTE — Telephone Encounter (Signed)
Pt. Is having issues with dryness of skin, hair & feet. Wants the nurse or Dr. To call her.

## 2010-10-23 NOTE — Telephone Encounter (Signed)
Called pt - talked about hydrating well, lotion after bathing and at least 1 other time during the day.  Reccommended lubriderm - also discusses head/scalp - encoraged to try selsun but need to leave on for 2-3 mins before rinsing. If no improvement in 7-10 days - need to see doctor. KIK

## 2010-11-10 ENCOUNTER — Other Ambulatory Visit: Payer: Self-pay | Admitting: Family Medicine

## 2011-01-04 ENCOUNTER — Ambulatory Visit (INDEPENDENT_AMBULATORY_CARE_PROVIDER_SITE_OTHER): Payer: BC Managed Care – PPO | Admitting: Family Medicine

## 2011-01-04 ENCOUNTER — Encounter: Payer: Self-pay | Admitting: Family Medicine

## 2011-01-04 DIAGNOSIS — E119 Type 2 diabetes mellitus without complications: Secondary | ICD-10-CM | POA: Insufficient documentation

## 2011-01-04 DIAGNOSIS — E785 Hyperlipidemia, unspecified: Secondary | ICD-10-CM

## 2011-01-04 DIAGNOSIS — F17200 Nicotine dependence, unspecified, uncomplicated: Secondary | ICD-10-CM

## 2011-01-04 DIAGNOSIS — R7309 Other abnormal glucose: Secondary | ICD-10-CM

## 2011-01-04 DIAGNOSIS — E1169 Type 2 diabetes mellitus with other specified complication: Secondary | ICD-10-CM | POA: Insufficient documentation

## 2011-01-04 LAB — MICROALBUMIN / CREATININE URINE RATIO
Microalb Creat Ratio: 1.1 mg/g (ref 0.0–30.0)
Microalb, Ur: 2.2 mg/dL — ABNORMAL HIGH (ref 0.0–1.9)

## 2011-01-04 LAB — LIPID PANEL: Total CHOL/HDL Ratio: 8

## 2011-01-04 LAB — LDL CHOLESTEROL, DIRECT: Direct LDL: 97 mg/dL

## 2011-01-04 NOTE — Progress Notes (Signed)
  Subjective:    Patient ID: Lisa Norton, female    DOB: Sep 28, 1970, 40 y.o.   MRN: [id number]  HPI Followup medical problems. She has history of obesity, low HDL, elevated triglycerides, type 2 diabetes, and borderline elevated blood pressure. Has lost weight since last visit about 10 pounds. She attributes this to metformin. Fasting blood sugars run 130. No symptoms of hyperglycemia. Last lipids LDL 140 and needs repeat. She is unfortunately still smoking about half a pack of cigarettes per day. No recent chest pains. No dyspnea. No consistent exercise. No hx of CAD.  Recent eye exam about 3 months ago normal  Review of Systems  Constitutional: Positive for fatigue. Negative for fever, activity change, appetite change and unexpected weight change.  Respiratory: Negative for cough and shortness of breath.   Cardiovascular: Negative for chest pain, palpitations and leg swelling.  Gastrointestinal: Negative for abdominal pain.  Genitourinary: Negative for dysuria.  Neurological: Negative for dizziness and headaches.       Objective:   Physical Exam  Constitutional: She is oriented to person, place, and time. She appears well-developed and well-nourished. No distress.  HENT:  Right Ear: External ear normal.  Left Ear: External ear normal.  Mouth/Throat: Oropharynx is clear and moist. No oropharyngeal exudate.  Neck: Neck supple. No thyromegaly present.  Cardiovascular: Normal rate, regular rhythm and normal heart sounds.   No murmur heard. Pulmonary/Chest: She has no wheezes. She has no rales.  Musculoskeletal: She exhibits no edema.       Feet no lesions. Normal sensory function  Lymphadenopathy:    She has no cervical adenopathy.  Neurological: She is alert and oriented to person, place, and time.  Psychiatric: She has a normal mood and affect.          Assessment & Plan:  #1 type 2 diabetes. Reassess A1c today. Continue to work on weight loss efforts. #2 dyslipidemia.  Recheck lipids. If LDL greater than 100 consider statin medication #3 ongoing nicotine use. Discussed strategies for stopping.

## 2011-01-05 NOTE — Progress Notes (Signed)
Quick Note:  Pt informed and she voiced her understanding ______ 

## 2011-01-20 ENCOUNTER — Emergency Department (HOSPITAL_COMMUNITY)
Admission: EM | Admit: 2011-01-20 | Discharge: 2011-01-20 | Disposition: A | Discharge status: Home / Self Care | Payer: Self-pay | Attending: Emergency Medicine | Admitting: Emergency Medicine

## 2011-01-20 DIAGNOSIS — Z79899 Other long term (current) drug therapy: Secondary | ICD-10-CM | POA: Insufficient documentation

## 2011-01-20 DIAGNOSIS — R197 Diarrhea, unspecified: Secondary | ICD-10-CM | POA: Insufficient documentation

## 2011-01-20 DIAGNOSIS — R079 Chest pain, unspecified: Secondary | ICD-10-CM | POA: Insufficient documentation

## 2011-01-20 DIAGNOSIS — E119 Type 2 diabetes mellitus without complications: Secondary | ICD-10-CM | POA: Insufficient documentation

## 2011-01-20 DIAGNOSIS — R112 Nausea with vomiting, unspecified: Secondary | ICD-10-CM | POA: Insufficient documentation

## 2011-01-20 LAB — CBC
HCT: 41.9 % (ref 36.0–46.0)
MCV: 94.4 fL (ref 78.0–100.0)
RBC: 4.44 MIL/uL (ref 3.87–5.11)
WBC: 7.2 10*3/uL (ref 4.0–10.5)

## 2011-01-20 LAB — DIFFERENTIAL
Lymphocytes Relative: 29 % (ref 12–46)
Lymphs Abs: 2.1 10*3/uL (ref 0.7–4.0)
Neutrophils Relative %: 61 % (ref 43–77)

## 2011-01-20 LAB — COMPREHENSIVE METABOLIC PANEL
Albumin: 3 g/dL — ABNORMAL LOW (ref 3.5–5.2)
Alkaline Phosphatase: 97 U/L (ref 39–117)
BUN: 13 mg/dL (ref 6–23)
CO2: 26 mEq/L (ref 19–32)
Chloride: 101 mEq/L (ref 96–112)
Creatinine, Ser: 0.53 mg/dL (ref 0.4–1.2)
GFR calc non Af Amer: >60 mL/min (ref >60)
Glucose, Bld: 185 mg/dL — ABNORMAL HIGH (ref 70–99)
Potassium: 3.4 mEq/L — ABNORMAL LOW (ref 3.5–5.1)
Total Bilirubin: 0.3 mg/dL (ref 0.3–1.2)

## 2011-01-20 LAB — POCT CARDIAC MARKERS
CKMB, poc: <1 ng/mL — ABNORMAL LOW (ref 1.0–8.0)
Myoglobin, poc: 38.2 ng/mL (ref 12–200)

## 2011-04-15 ENCOUNTER — Ambulatory Visit (INDEPENDENT_AMBULATORY_CARE_PROVIDER_SITE_OTHER): Payer: Self-pay | Admitting: Family Medicine

## 2011-04-15 ENCOUNTER — Encounter: Payer: Self-pay | Admitting: Family Medicine

## 2011-04-15 DIAGNOSIS — E119 Type 2 diabetes mellitus without complications: Secondary | ICD-10-CM

## 2011-04-15 DIAGNOSIS — J209 Acute bronchitis, unspecified: Secondary | ICD-10-CM

## 2011-04-15 MED ORDER — HYDROCODONE-HOMATROPINE 5-1.5 MG/5ML PO SYRP: 5.0000 mL | ORAL_SOLUTION | Freq: Four times a day (QID) | ORAL | Status: AC | PRN

## 2011-04-15 NOTE — Progress Notes (Signed)
  Subjective:    Patient ID: Lisa Norton, female    DOB: 07-09-1971, 40 y.o.   MRN: [id number]  HPI Acute illness. Onset about 4 days ago. Nasal congestion and productive cough with green-yellow sputum. Increased facial pressure. Intermittent chills but no definite fever. Has tried over-the-counter medications such as NyQuil without relief.  Long history of smoking. Denies any nausea, vomiting, or diarrhea.  Type 2 diabetes. She has lost about 20 pounds this year due to her efforts. Not monitoring blood sugars regularly at home   Review of Systems  Constitutional: Positive for chills. Negative for fever and fatigue.  HENT: Positive for congestion and sinus pressure. Negative for sore throat and voice change.   Respiratory: Positive for cough. Negative for wheezing.   Cardiovascular: Negative for chest pain.       Objective:   Physical Exam  Constitutional: She appears well-developed and well-nourished. No distress.  HENT:  Right Ear: External ear normal.  Left Ear: External ear normal.  Mouth/Throat: Oropharynx is clear and moist. No oropharyngeal exudate.  Neck: Neck supple.  Cardiovascular: Normal rate, regular rhythm and normal heart sounds.   Pulmonary/Chest: Effort normal and breath sounds normal. No respiratory distress. She has no wheezes. She has no rales.  Musculoskeletal: She exhibits no edema.  Lymphadenopathy:    She has no cervical adenopathy.          Assessment & Plan:  Cough probably secondary to acute bronchitis. Hycodan for cough suppression at night. If productive cough persist Avelox 400 mg daily for 6 days. Patient is again encouraged to stop smoking Type 2 diabetes. Continue weight loss efforts. Routine followup in the next couple of months to reassess A1c

## 2011-04-15 NOTE — Patient Instructions (Signed)
Avelox 400 mg one daily. Drink lots of fluids.

## 2011-06-02 ENCOUNTER — Telehealth: Payer: Self-pay | Admitting: Family Medicine

## 2011-06-02 NOTE — Telephone Encounter (Signed)
bactroban ointment twice daily, disp 22 gm and clean ulcer daily with soap and water and needs follow up ASAP.

## 2011-06-02 NOTE — Telephone Encounter (Signed)
Pt called and said that she is diabetic and she found a blister like sore,that has come up in between her toes.The sore is white in color and has been there almost 2 wks. Pt has tried treating with antibiotic ointment and atheletes foot cream. Nothing has helped. Area is sore to touch, has not gotten any larger. Sometimes itches. Pts insurance does not become effective until 06/08/11.

## 2011-06-03 MED ORDER — MUPIROCIN 2 % EX OINT: TOPICAL_OINTMENT | CUTANEOUS | Status: AC

## 2011-06-03 NOTE — Telephone Encounter (Signed)
Gave pt Dr. Lucie Leather recommendations.  She will try to call in the am to make appt for tomorrow afternoon.

## 2011-06-03 NOTE — Telephone Encounter (Signed)
LMTCB again

## 2011-06-03 NOTE — Telephone Encounter (Signed)
LMTCB

## 2011-06-04 ENCOUNTER — Ambulatory Visit (INDEPENDENT_AMBULATORY_CARE_PROVIDER_SITE_OTHER): Payer: Self-pay | Admitting: Family Medicine

## 2011-06-04 ENCOUNTER — Encounter: Payer: Self-pay | Admitting: Family Medicine

## 2011-06-04 DIAGNOSIS — T148XXA Other injury of unspecified body region, initial encounter: Secondary | ICD-10-CM

## 2011-06-04 DIAGNOSIS — E119 Type 2 diabetes mellitus without complications: Secondary | ICD-10-CM

## 2011-06-04 NOTE — Progress Notes (Signed)
  Subjective:    Patient ID: Lisa Norton, female    DOB: 12/06/1970, 40 y.o.   MRN: [id number]  HPI Patient seen with a skin lesion between the web space fourth and fifth toes of left foot. She has type 2 diabetes which has been well controlled. Onset about 2 weeks ago. Initially she felt this was athlete's foot and tried topical anti fungal without improvement. Subsequently tried hydrocortisone cream and hydrogen peroxide without improvement. No drainage. No redness. Minimal soreness. 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 Also walks with supination which may be applying additional pressure to lateral part of foot.  She has continued to do excellent job with weight loss. Blood sugar is well controlled.   Review of Systems  Constitutional: Negative for fever and chills.  Respiratory: Negative for shortness of breath.   Cardiovascular: Negative for chest pain.       Objective:   Physical Exam  Constitutional: She appears well-developed and well-nourished.  Cardiovascular: Normal rate and regular rhythm.   Pulmonary/Chest: Effort normal and breath sounds normal. No respiratory distress. She has no wheezes. She has no rales.  Skin:       Patient has peeling skin the web space between the left fourth and fifth digit. This appears to been a vesicle that has ruptured. No purulence. No erythema. No warmth. No scaly skin and no pustules          Assessment & Plan:  Vesicle left foot between fourth and fifth digits. Doubt tinea pedis. Keep clean with soap and water. Follow up promptly if signs of cellulitis. Bactroban topically. Avoid excessive pressure on toes and foot.  Foot supination probably contributed to increased pressure in this region

## 2011-06-04 NOTE — Telephone Encounter (Signed)
Pt sch for 06-04-11  330pm

## 2011-06-04 NOTE — Patient Instructions (Signed)
Keep foot clean with soap and water. Consider bactroban antibiotic ointment twice day Follow up promptly for any redness or signs of infection.

## 2011-06-18 ENCOUNTER — Encounter: Payer: Self-pay | Admitting: Family Medicine

## 2011-06-18 ENCOUNTER — Ambulatory Visit (INDEPENDENT_AMBULATORY_CARE_PROVIDER_SITE_OTHER): Payer: 59 | Admitting: Family Medicine

## 2011-06-18 VITALS — BP 120/80 | Temp 98.3°F | Wt 209.0 lb

## 2011-06-18 DIAGNOSIS — H109 Unspecified conjunctivitis: Secondary | ICD-10-CM

## 2011-06-18 DIAGNOSIS — J209 Acute bronchitis, unspecified: Secondary | ICD-10-CM

## 2011-06-18 DIAGNOSIS — H10029 Other mucopurulent conjunctivitis, unspecified eye: Secondary | ICD-10-CM

## 2011-06-18 MED ORDER — AZITHROMYCIN 250 MG PO TABS: ORAL_TABLET | ORAL | Status: AC

## 2011-06-18 MED ORDER — POLYMYXIN B-TRIMETHOPRIM 10000-0.1 UNIT/ML-% OP SOLN: 2.0000 [drp] | OPHTHALMIC | Status: AC

## 2011-06-18 NOTE — Progress Notes (Signed)
  Subjective:    Patient ID: Lisa Norton, female    DOB: 1971-06-30, 40 y.o.   MRN: [id number]  HPI  Acute visit. 3 four-day history of cough and nasal congestion. Facial pain. Intermittent headaches. No fever. Cough productive of green sputum. Ongoing nicotine use. No significant dyspnea or wheezing. Some right eye redness and mild swelling with some yellowish drainage earlier today. No eye pain and no blurred vision. Has not taken any over-the-counter medications other than Nyquil which has not helped her symptoms.   Review of Systems  Constitutional: Positive for fatigue. Negative for fever and chills.  HENT: Positive for sinus pressure. Negative for ear pain and sore throat.   Respiratory: Positive for cough. Negative for shortness of breath.   Cardiovascular: Negative for chest pain.  Neurological: Positive for headaches.       Objective:   Physical Exam  Constitutional: She appears well-developed and well-nourished.  HENT:       Eardrums appear normal. Oropharynx minimal erythema. No exudate  Eyes:       Right conjunctiva erythematous compared to the left. No purulent secretions at this time  Neck: Neck supple.  Cardiovascular: Normal rate and regular rhythm.   Pulmonary/Chest: Effort normal and breath sounds normal. No respiratory distress. She has no wheezes. She has no rales.  Lymphadenopathy:    She has no cervical adenopathy.          Assessment & Plan:  #1 right conjunctivitis. Suspect bacterial. Polytrim ophthalmic drops as instructed #2 acute bronchitis. Probably viral. Observe for now. Start Zithromax if she has any fever or worsening cough

## 2011-07-07 ENCOUNTER — Encounter: Payer: Self-pay | Admitting: Family Medicine

## 2011-07-07 ENCOUNTER — Ambulatory Visit (INDEPENDENT_AMBULATORY_CARE_PROVIDER_SITE_OTHER): Payer: 59 | Admitting: Family Medicine

## 2011-07-07 DIAGNOSIS — R05 Cough: Secondary | ICD-10-CM

## 2011-07-07 DIAGNOSIS — E785 Hyperlipidemia, unspecified: Secondary | ICD-10-CM

## 2011-07-07 DIAGNOSIS — I10 Essential (primary) hypertension: Secondary | ICD-10-CM

## 2011-07-07 DIAGNOSIS — R059 Cough, unspecified: Secondary | ICD-10-CM

## 2011-07-07 DIAGNOSIS — E119 Type 2 diabetes mellitus without complications: Secondary | ICD-10-CM

## 2011-07-07 LAB — LIPID PANEL
LDL Cholesterol: 134 mg/dL — ABNORMAL HIGH (ref 0–99)
VLDL: 30.8 mg/dL (ref 0.0–40.0)

## 2011-07-07 LAB — HEMOGLOBIN A1C: Hgb A1c MFr Bld: 6.1 % (ref 4.6–6.5)

## 2011-07-07 MED ORDER — HYDROCODONE-HOMATROPINE 5-1.5 MG/5ML PO SYRP: 5.0000 mL | ORAL_SOLUTION | Freq: Four times a day (QID) | ORAL | Status: AC | PRN

## 2011-07-07 NOTE — Progress Notes (Signed)
  Subjective:    Patient ID: Lisa Norton, female    DOB: [birth year], 40 y.o.   MRN: [id number]  HPI  Patient seen for medical followup.  Type 2 DM. Last A1c 6.5%. Excellent job with steady weight loss. She does some walking for exercise and does make some dietary choices. Has lost about 38 pounds overall. Blood sugars fairly well controlled. Fasting blood sugars mostly run 120. Post prandials usually 130-140. No symptoms of hyperglycemia.  No side effects from metformin.  Chest dry cough intermittently past couple of weeks. No hemoptysis. No appetite change. No dyspnea. Still smoking. Delsym cough syrup without relief.  History of dyslipidemia. High triglycerides and low HDL. She has been intolerant of omega-3 supplements.   Review of Systems  Constitutional: Negative for fever, chills and unexpected weight change.  Respiratory: Positive for cough. Negative for choking, shortness of breath and wheezing.   Cardiovascular: Negative for chest pain, palpitations and leg swelling.  Neurological: Negative for dizziness.       Objective:   Physical Exam  Constitutional: She appears well-developed and well-nourished. No distress.  HENT:  Right Ear: External ear normal.  Left Ear: External ear normal.  Mouth/Throat: Oropharynx is clear and moist.  Neck: Neck supple.  Cardiovascular: Normal rate and regular rhythm.   Pulmonary/Chest: Effort normal and breath sounds normal. No respiratory distress. She has no wheezes. She has no rales.  Musculoskeletal: She exhibits no edema.          Assessment & Plan:  #1 type 2 diabetes. History of good control. Should continue to do well with weight loss. Recheck A1c #2 dry cough. Suspect related to viral bronchitis. Hycodan for nighttime cough. #3 dyslipidemia. Recheck lipids. Intolerant of omega-3 in past.

## 2011-07-08 ENCOUNTER — Other Ambulatory Visit: Payer: Self-pay | Admitting: Family Medicine

## 2011-07-08 DIAGNOSIS — E785 Hyperlipidemia, unspecified: Secondary | ICD-10-CM

## 2011-07-08 MED ORDER — ATORVASTATIN CALCIUM 10 MG PO TABS: 10.0000 mg | ORAL_TABLET | Freq: Every day | ORAL | Status: DC

## 2011-07-08 NOTE — Progress Notes (Signed)
Quick Note:  Message left for pt on VM, labs mailed to pt home with instructions highlighted, labs and meds ordered ______

## 2011-09-13 ENCOUNTER — Other Ambulatory Visit: Payer: Self-pay | Admitting: Family Medicine

## 2011-09-13 ENCOUNTER — Other Ambulatory Visit (INDEPENDENT_AMBULATORY_CARE_PROVIDER_SITE_OTHER): Payer: 59

## 2011-09-13 DIAGNOSIS — E785 Hyperlipidemia, unspecified: Secondary | ICD-10-CM

## 2011-09-14 LAB — LIPID PANEL
Cholesterol: 210 mg/dL — ABNORMAL HIGH (ref 0–200)
Triglycerides: 122 mg/dL (ref 0.0–149.0)

## 2011-09-14 LAB — HEPATIC FUNCTION PANEL
ALT: 15 U/L (ref 0–35)
AST: 17 U/L (ref 0–37)
Albumin: 3.5 g/dL (ref 3.5–5.2)
Total Protein: 6.6 g/dL (ref 6.0–8.3)

## 2011-09-14 LAB — LDL CHOLESTEROL, DIRECT: Direct LDL: 159.8 mg/dL

## 2011-09-16 ENCOUNTER — Other Ambulatory Visit: Payer: Self-pay | Admitting: Family Medicine

## 2011-09-16 DIAGNOSIS — E119 Type 2 diabetes mellitus without complications: Secondary | ICD-10-CM

## 2011-09-16 DIAGNOSIS — E785 Hyperlipidemia, unspecified: Secondary | ICD-10-CM

## 2011-09-16 MED ORDER — ATORVASTATIN CALCIUM 20 MG PO TABS: 20.0000 mg | ORAL_TABLET | Freq: Every day | ORAL | Status: DC

## 2011-09-16 NOTE — Progress Notes (Signed)
Quick Note:  Pt informed on VM labs will be mailed to pt home with instructions highlighted, question, feel free to call. Future labs ordered ______

## 2011-09-20 ENCOUNTER — Encounter: Payer: Self-pay | Admitting: Family Medicine

## 2011-09-20 ENCOUNTER — Ambulatory Visit (INDEPENDENT_AMBULATORY_CARE_PROVIDER_SITE_OTHER): Payer: 59 | Admitting: Family Medicine

## 2011-09-20 VITALS — BP 110/62 | Temp 98.6°F | Wt 202.0 lb

## 2011-09-20 DIAGNOSIS — R197 Diarrhea, unspecified: Secondary | ICD-10-CM

## 2011-09-20 MED ORDER — DIPHENOXYLATE-ATROPINE 2.5-0.025 MG PO TABS: 1.0000 | ORAL_TABLET | Freq: Four times a day (QID) | ORAL | Status: AC | PRN

## 2011-09-20 NOTE — Patient Instructions (Signed)
Diarrhea Infections caused by germs (bacterial) or a virus commonly cause diarrhea. Your caregiver has determined that with time, rest and fluids, the diarrhea should improve. In general, eat normally while drinking more water than usual. Although water may prevent dehydration, it does not contain salt and minerals (electrolytes). Broths, weak tea without caffeine and oral rehydration solutions (ORS) replace fluids and electrolytes. Small amounts of fluids should be taken frequently. Large amounts at one time may not be tolerated. Plain water may be harmful in infants and the elderly. Oral rehydrating solutions (ORS) are available at pharmacies and grocery stores. ORS replace water and important electrolytes in proper proportions. Sports drinks are not as effective as ORS and may be harmful due to sugars worsening diarrhea.  ORS is especially recommended for use in children with diarrhea. As a general guideline for children, replace any new fluid losses from diarrhea and/or vomiting with ORS as follows:   If your child weighs 22 pounds or under (10 kg or less), give 60-120 mL ( -  cup or 2 - 4 ounces) of ORS for each episode of diarrheal stool or vomiting episode.   If your child weighs more than 22 pounds (more than 10 kgs), give 120-240 mL ( - 1 cup or 4 - 8 ounces) of ORS for each diarrheal stool or episode of vomiting.   While correcting for dehydration, children should eat normally. However, foods high in sugar should be avoided because this may worsen diarrhea. Large amounts of carbonated soft drinks, juice, gelatin desserts and other highly sugared drinks should be avoided.   After correction of dehydration, other liquids that are appealing to the child may be added. Children should drink small amounts of fluids frequently and fluids should be increased as tolerated. Children should drink enough fluids to keep urine clear or pale yellow.   Adults should eat normally while drinking more fluids  than usual. Drink small amounts of fluids frequently and increase as tolerated. Drink enough fluids to keep urine clear or pale yellow. Broths, weak decaffeinated tea, lemon lime soft drinks (allowed to go flat) and ORS replace fluids and electrolytes.   Avoid:   Carbonated drinks.   Juice.   Extremely hot or cold fluids.   Caffeine drinks.   Fatty, greasy foods.   Alcohol.   Tobacco.   Too much intake of anything at one time.   Gelatin desserts.   Probiotics are active cultures of beneficial bacteria. They may lessen the amount and number of diarrheal stools in adults. Probiotics can be found in yogurt with active cultures and in supplements.   Wash hands well to avoid spreading bacteria and virus.   Anti-diarrheal medications are not recommended for infants and children.   Only take over-the-counter or prescription medicines for pain, discomfort or fever as directed by your caregiver. Do not give aspirin to children because it may cause Reye's Syndrome.   For adults, ask your caregiver if you should continue all prescribed and over-the-counter medicines.   If your caregiver has given you a follow-up appointment, it is very important to keep that appointment. Not keeping the appointment could result in a chronic or permanent injury, and disability. If there is any problem keeping the appointment, you must call back to this facility for assistance.  SEEK IMMEDIATE MEDICAL CARE IF:   You or your child is unable to keep fluids down or other symptoms or problems become worse in spite of treatment.   Vomiting or diarrhea develops and becomes persistent.     There is vomiting of blood or bile (green material).   There is blood in the stool or the stools are black and tarry.   There is no urine output in 6-8 hours or there is only a small amount of very dark urine.   Abdominal pain develops, increases or localizes.   You have a fever.   Your baby is older than 3 months with a  rectal temperature of 102 F (38.9 C) or higher.   Your baby is 11 months old or younger with a rectal temperature of 100.4 F (38 C) or higher.   You or your child develops excessive weakness, dizziness, fainting or extreme thirst.   You or your child develops a rash, stiff neck, severe headache or become irritable or sleepy and difficult to awaken.  MAKE SURE YOU:   Understand these instructions.   Will watch your condition.   Will get help right away if you are not doing well or get worse.  Document Released: 08/06/2002 Document Revised: 04/28/2011 Document Reviewed: 06/23/2009 [facility] Patient Information 2012 [place], [place].  Try over the counter Imodium as needed for diarrhea.

## 2011-09-20 NOTE — Progress Notes (Signed)
  Subjective:    Patient ID: Lisa Norton, female    DOB: 10/28/1970, 41 y.o.   MRN: [id number]  HPI  Acute visit. Patient had onset fever up to 101 last Thursday. She developed nausea, vomiting, and nonbloody diarrhea then. Her nausea and vomiting improved after one day and fever also resolved after one day. She's had some persistent diarrhea with about 3 stools per day nonbloody. Some diffuse abdominal cramps.  She is able to keep down fluids at this time. No localizing abdominal pain.   Review of Systems  Constitutional: Negative for fever and chills.  Respiratory: Negative for shortness of breath.   Gastrointestinal: Positive for abdominal pain and diarrhea. Negative for nausea, vomiting and blood in stool.       Objective:   Physical Exam  Constitutional: She appears well-developed and well-nourished.  HENT:  Mouth/Throat: Oropharynx is clear and moist.  Cardiovascular: Normal rate and regular rhythm.   Pulmonary/Chest: Breath sounds normal. No respiratory distress. She has no wheezes. She has no rales.  Abdominal: Soft. Bowel sounds are normal. She exhibits no distension and no mass. There is no tenderness. There is no rebound and no guarding.          Assessment & Plan:  Diarrhea. Likely related to recent viral gastroenteritis. Try over-the-counter Imodium. Appropriate diet discussed. Followup promptly for fever or persistent symptoms

## 2011-10-08 ENCOUNTER — Other Ambulatory Visit: Payer: Self-pay | Admitting: Family Medicine

## 2011-10-08 DIAGNOSIS — E119 Type 2 diabetes mellitus without complications: Secondary | ICD-10-CM

## 2011-10-08 MED ORDER — GLUCOSE BLOOD VI STRP: ORAL_STRIP | Status: DC

## 2011-10-08 MED ORDER — ONETOUCH DELICA LANCETS MISC: Status: DC

## 2011-10-08 MED ORDER — METFORMIN HCL 500 MG PO TABS: 500.0000 mg | ORAL_TABLET | Freq: Two times a day (BID) | ORAL | Status: DC

## 2011-10-08 NOTE — Telephone Encounter (Signed)
Pt need metformin 500mg  twice a day call into [facility] [place] [phone] and one touch test  Strip and lancets call into [facility] [phone]. Pt is out of med and supplies. Pt has 2 pharmacies.

## 2011-10-19 ENCOUNTER — Ambulatory Visit (INDEPENDENT_AMBULATORY_CARE_PROVIDER_SITE_OTHER): Payer: 59 | Admitting: Family Medicine

## 2011-10-19 ENCOUNTER — Encounter: Payer: Self-pay | Admitting: Family Medicine

## 2011-10-19 VITALS — BP 110/62 | Temp 98.6°F | Wt 202.0 lb

## 2011-10-19 DIAGNOSIS — J069 Acute upper respiratory infection, unspecified: Secondary | ICD-10-CM

## 2011-10-19 NOTE — Progress Notes (Signed)
  Subjective:    Patient ID: Lisa Norton, female    DOB: September 16, 1970, 41 y.o.   MRN: [id number]  HPI  Acute visit. One day history of malaise, nasal congestion, mild headache, mild sore throat. She had some left upper gum edema couple days ago. No purulent secretions. Chills off and on but no definite fever. Denies any cough. Daughter with similar symptoms last week.  Review of Systems  Constitutional: Positive for chills and fatigue. Negative for fever.  HENT: Positive for congestion and sore throat.   Respiratory: Negative for cough.   Neurological: Positive for headaches.       Objective:   Physical Exam  Constitutional: She appears well-developed and well-nourished.  HENT:  Right Ear: External ear normal.  Left Ear: External ear normal.  Mouth/Throat: Oropharynx is clear and moist.  Neck: Neck supple.  Cardiovascular: Normal rate and regular rhythm.   Pulmonary/Chest: Effort normal and breath sounds normal. No respiratory distress. She has no wheezes. She has no rales.  Lymphadenopathy:    She has no cervical adenopathy.          Assessment & Plan:  Viral URI. Reassurance given. No indication for antibiotics at this time. Work note written for today.

## 2011-10-19 NOTE — Patient Instructions (Signed)

## 2011-10-20 ENCOUNTER — Telehealth: Payer: Self-pay | Admitting: Family Medicine

## 2011-10-20 NOTE — Telephone Encounter (Signed)
Pt need work note from 10-19-2010 and returning to work 10-20-2010.

## 2011-10-20 NOTE — Telephone Encounter (Signed)
Note written, pt will pick up

## 2011-10-22 ENCOUNTER — Telehealth: Payer: Self-pay | Admitting: Family Medicine

## 2011-10-22 MED ORDER — AMOXICILLIN 875 MG PO TABS: 875.0000 mg | ORAL_TABLET | Freq: Two times a day (BID) | ORAL | Status: AC

## 2011-10-22 NOTE — Telephone Encounter (Signed)
Pt called req abx for URI. Pls call in to [facility] in [place].

## 2011-10-22 NOTE — Telephone Encounter (Signed)
Amoxicillin 875 mg pobid for 10 days 

## 2011-10-22 NOTE — Telephone Encounter (Signed)
Please advise 

## 2011-10-22 NOTE — Telephone Encounter (Signed)
Pt informed Rx sent. 

## 2011-10-27 ENCOUNTER — Other Ambulatory Visit: Payer: Self-pay | Admitting: *Deleted

## 2011-10-27 MED ORDER — VARENICLINE TARTRATE 1 MG PO TABS: 1.0000 mg | ORAL_TABLET | Freq: Two times a day (BID) | ORAL | Status: DC

## 2012-01-05 ENCOUNTER — Ambulatory Visit: Payer: 59 | Admitting: Family Medicine

## 2012-01-05 ENCOUNTER — Telehealth: Payer: Self-pay | Admitting: Family Medicine

## 2012-01-05 ENCOUNTER — Encounter: Payer: Self-pay | Admitting: Family Medicine

## 2012-01-05 ENCOUNTER — Ambulatory Visit (INDEPENDENT_AMBULATORY_CARE_PROVIDER_SITE_OTHER): Payer: 59 | Admitting: Family Medicine

## 2012-01-05 VITALS — BP 120/72 | Temp 98.2°F | Wt 203.0 lb

## 2012-01-05 DIAGNOSIS — E119 Type 2 diabetes mellitus without complications: Secondary | ICD-10-CM

## 2012-01-05 DIAGNOSIS — F17200 Nicotine dependence, unspecified, uncomplicated: Secondary | ICD-10-CM

## 2012-01-05 DIAGNOSIS — E785 Hyperlipidemia, unspecified: Secondary | ICD-10-CM

## 2012-01-05 LAB — HEMOGLOBIN A1C: Hgb A1c MFr Bld: 6.1 % (ref 4.6–6.5)

## 2012-01-05 LAB — HEPATIC FUNCTION PANEL
ALT: 11 U/L (ref 0–35)
AST: 17 U/L (ref 0–37)
Bilirubin, Direct: 0 mg/dL (ref 0.0–0.3)
Total Protein: 6.6 g/dL (ref 6.0–8.3)

## 2012-01-05 LAB — LIPID PANEL
Cholesterol: 139 mg/dL (ref 0–200)
HDL: 27.7 mg/dL — ABNORMAL LOW (ref >39.00)
VLDL: 14.6 mg/dL (ref 0.0–40.0)

## 2012-01-05 MED ORDER — VARENICLINE TARTRATE 0.5 MG X 11 & 1 MG X 42 PO MISC: ORAL | Status: DC

## 2012-01-05 MED ORDER — VARENICLINE TARTRATE 1 MG PO TABS: 1.0000 mg | ORAL_TABLET | Freq: Two times a day (BID) | ORAL | Status: DC

## 2012-01-05 NOTE — Progress Notes (Signed)
  Subjective:    Patient ID: Lisa Norton, female    DOB: 1970-10-27, 41 y.o.   MRN: [id number]  HPI  Patient here for medical followup. She has history of obesity, type 2 diabetes, dyslipidemia, and ongoing nicotine use. She tried Chantix only briefly but did not continue because of expense. She'll like to consider retrial. Currently smokes one pack cigarettes per day. She denies any side effects from Chantix.  Patient is not monitoring blood sugars regularly. Fasting blood sugars recently around 140. Weight is stable. Walks for exercise about 3 days per week. She's lost about 40 pounds during past year.  She has some problems chronically with insomnia. Early morning awakening. No depression. Denies stress issues. Has tried Benadryl but had side effects.  Past Medical History  Diagnosis Date  . COLD SORE 05/15/2010  . TOBACCO ABUSE 12/12/2009  . DEPRESSION 12/12/2009  . HYPERTENSION 12/12/2009  . PREDIABETES 02/11/2010   Past Surgical History  Procedure Date  . Cholecystectomy 2006  . Tonsillectomy 1988    reports that she has been smoking Cigarettes.  She has a 30 pack-year smoking history. She does not have any smokeless tobacco history on file. Her alcohol and drug histories not on file. family history is not on file. No Known Allergies    Review of Systems  Constitutional: Negative for appetite change, fatigue and unexpected weight change.  Eyes: Negative for visual disturbance.  Respiratory: Negative for cough, chest tightness, shortness of breath and wheezing.   Cardiovascular: Negative for chest pain, palpitations and leg swelling.  Neurological: Negative for dizziness, seizures, syncope, weakness, light-headedness and headaches.       Objective:   Physical Exam  Constitutional: She is oriented to person, place, and time. She appears well-developed and well-nourished.  HENT:  Mouth/Throat: Oropharynx is clear and moist.  Neck: Neck supple. No thyromegaly present.    Cardiovascular: Normal rate and regular rhythm.   Pulmonary/Chest: Effort normal and breath sounds normal. No respiratory distress. She has no wheezes. She has no rales.  Musculoskeletal: She exhibits no edema.  Lymphadenopathy:    She has no cervical adenopathy.  Neurological: She is alert and oriented to person, place, and time. No cranial nerve deficit.          Assessment & Plan:  #1 type 2 diabetes. Recheck hemoglobin A1c. Continue monitor for yearly eye exam. #2 dyslipidemia. Currently Lipitor 20 mg daily. Recheck lipid and hepatic panel  #3 nicotine use. Spent 5 minutes counseling patient. She'll try Chantix again. No previous adverse side effects. #4 chronic insomnia.  Sleep hygiene discussed.

## 2012-01-05 NOTE — Telephone Encounter (Signed)
Patient called stating that upon picking up her rx for chantix she was informed her insurance would not cover it. Patient requests to have something else called in that her insurance will cover. Please advise.

## 2012-01-05 NOTE — Patient Instructions (Signed)

## 2012-01-05 NOTE — Telephone Encounter (Signed)
I told her insurance companies are not covering Chantix at OV today?

## 2012-01-05 NOTE — Telephone Encounter (Signed)
Have her find out what they cover-?wellbutrin. I doubt they will cover that either.

## 2012-01-06 ENCOUNTER — Telehealth: Payer: Self-pay | Admitting: *Deleted

## 2012-01-06 MED ORDER — BUPROPION HCL ER (XL) 300 MG PO TB24: 300.0000 mg | ORAL_TABLET | Freq: Every day | ORAL | Status: DC

## 2012-01-06 NOTE — Telephone Encounter (Signed)
Pt informed Rx sent to [facility]

## 2012-01-06 NOTE — Telephone Encounter (Signed)
VM left on home phone asking her to check into Wellbutrin

## 2012-01-06 NOTE — Telephone Encounter (Signed)
Pt requesting Wellbutrin be sent to her pharmacy and then the pharmacist will be able to determine if her insurance will cover.

## 2012-01-06 NOTE — Progress Notes (Signed)
Quick Note:  Pt informed on cell VM ______ 

## 2012-01-06 NOTE — Telephone Encounter (Signed)
Wellbutrin xl 300 mg po q am #30 with 2 refills.

## 2012-01-07 ENCOUNTER — Telehealth: Payer: Self-pay | Admitting: Family Medicine

## 2012-01-07 NOTE — Telephone Encounter (Signed)
Pt would like blood work results °

## 2012-01-10 NOTE — Telephone Encounter (Signed)
Cell phone no longer in service, message left on home phone VM labs will be mailed to her home

## 2012-01-12 ENCOUNTER — Telehealth: Payer: Self-pay | Admitting: Family Medicine

## 2012-01-12 NOTE — Telephone Encounter (Signed)
Pt requesting results of labs. Please contact °

## 2012-01-14 NOTE — Telephone Encounter (Signed)
Tried to call back several times, will send a copy to her home

## 2012-02-07 ENCOUNTER — Telehealth: Payer: Self-pay | Admitting: Family Medicine

## 2012-02-07 NOTE — Telephone Encounter (Signed)
Please put pt on schedule tomorrow 11:45.  Pt aware of time.  Thank you

## 2012-02-07 NOTE — Telephone Encounter (Signed)
Return PC made, I had to leave a message, no answer.

## 2012-02-07 NOTE — Telephone Encounter (Signed)
Pt would like to be seen today she states her breasts have been sore X2days, sore to the point she does not want anything touching them. Please contact pt

## 2012-02-08 ENCOUNTER — Ambulatory Visit: Payer: Self-pay | Admitting: Family Medicine

## 2012-04-04 ENCOUNTER — Telehealth: Payer: Self-pay | Admitting: Family Medicine

## 2012-04-04 NOTE — Telephone Encounter (Signed)
We will sacrifice some lipid control, but let's try Pravastatin 40 mg once daily.

## 2012-04-04 NOTE — Telephone Encounter (Signed)
Pt called and no longer has health insurance. Req a cheaper med than Atorvastatin (Lipitor). Pt is out of med. Pls call in cheaper med to [facility] in [place], unless Jordan Hawks has med on $4 plan.

## 2012-04-05 MED ORDER — PRAVASTATIN SODIUM 40 MG PO TABS: 40.0000 mg | ORAL_TABLET | Freq: Every day | ORAL | Status: DC

## 2012-04-05 NOTE — Telephone Encounter (Signed)
Pt informed

## 2012-06-26 ENCOUNTER — Telehealth: Payer: Self-pay | Admitting: Family Medicine

## 2012-06-26 MED ORDER — VALACYCLOVIR HCL 1 G PO TABS: ORAL_TABLET | ORAL | Status: DC

## 2012-06-26 NOTE — Telephone Encounter (Signed)
Pt called and is need a refill of med for fever blisters on mouth. valACYclovir (VALTREX) 1000 MG tablet to [facility] in [place].  Pt req that this be called in today.

## 2012-07-07 ENCOUNTER — Telehealth: Payer: Self-pay | Admitting: *Deleted

## 2012-07-07 ENCOUNTER — Ambulatory Visit: Payer: 59 | Admitting: Family Medicine

## 2012-07-07 NOTE — Telephone Encounter (Signed)
I called pt, she forgot her 6 month follow-up.

## 2012-07-07 NOTE — Telephone Encounter (Signed)
Do not charge no show.  

## 2012-09-25 ENCOUNTER — Ambulatory Visit (INDEPENDENT_AMBULATORY_CARE_PROVIDER_SITE_OTHER): Payer: Self-pay | Admitting: Family Medicine

## 2012-09-25 ENCOUNTER — Encounter: Payer: Self-pay | Admitting: Family Medicine

## 2012-09-25 VITALS — BP 110/70 | Temp 98.5°F | Wt 215.0 lb

## 2012-09-25 DIAGNOSIS — J209 Acute bronchitis, unspecified: Secondary | ICD-10-CM

## 2012-09-25 MED ORDER — HYDROCODONE-HOMATROPINE 5-1.5 MG/5ML PO SYRP: 5.0000 mL | ORAL_SOLUTION | Freq: Four times a day (QID) | ORAL | Status: AC | PRN

## 2012-09-25 NOTE — Progress Notes (Signed)
  Subjective:    Patient ID: Lisa Norton, female    DOB: 02-18-1971, 42 y.o.   MRN: [id number]  HPI  Acute visit. Patient is a smoker seen with 2 to three-day history of cough which is mostly dry. No increased dyspnea. She's had some mild nasal congestion. Intermittent chills but no documented fever. Increased malaise and body aches. No sick contacts. Denies sore throat  Patient type 2 diabetes. History of good control. Recently ran out of insurance and not monitoring sugars  Review of Systems  Constitutional: Positive for chills. Negative for fever.  HENT: Positive for congestion and sinus pressure.   Respiratory: Positive for cough. Negative for wheezing.        Objective:   Physical Exam  Constitutional: She appears well-developed and well-nourished. No distress.  HENT:  Right Ear: External ear normal.  Left Ear: External ear normal.  Mouth/Throat: Oropharynx is clear and moist.  Neck: Neck supple.  Cardiovascular: Normal rate and regular rhythm.   Pulmonary/Chest: Effort normal and breath sounds normal. No respiratory distress. She has no wheezes. She has no rales.          Assessment & Plan:  Acute bronchitis. Suspect viral. Hycodan cough syrup for nighttime use as needed. Patient will try to schedule diabetes follow up.   She has poor compliance secondary to lack of insurance

## 2012-09-25 NOTE — Patient Instructions (Addendum)

## 2012-10-04 ENCOUNTER — Other Ambulatory Visit: Payer: Self-pay

## 2012-10-16 ENCOUNTER — Other Ambulatory Visit: Payer: Self-pay | Admitting: Family Medicine

## 2012-10-16 ENCOUNTER — Telehealth: Payer: Self-pay | Admitting: Family Medicine

## 2012-10-16 NOTE — Telephone Encounter (Signed)
Please call patient when previous request is done °

## 2012-10-16 NOTE — Telephone Encounter (Signed)
Metformin filled, pt will find out from pharmacy what meds are less costly to replace Pravastatin

## 2012-10-16 NOTE — Telephone Encounter (Signed)
Patient called stating that she is out of her metformin 500 mg 1 po bid with meal and need it sent to [facility] in [place]. Patient also states the price of her pravastatin has increased and she will need something else cheaper. Please assist.

## 2012-10-18 ENCOUNTER — Other Ambulatory Visit: Payer: Self-pay

## 2012-10-18 ENCOUNTER — Encounter: Payer: Self-pay | Admitting: Family Medicine

## 2012-10-18 ENCOUNTER — Ambulatory Visit (INDEPENDENT_AMBULATORY_CARE_PROVIDER_SITE_OTHER): Payer: Self-pay | Admitting: Family Medicine

## 2012-10-18 VITALS — BP 120/78 | Temp 98.4°F | Wt 215.0 lb

## 2012-10-18 DIAGNOSIS — S80811A Abrasion, right lower leg, initial encounter: Secondary | ICD-10-CM

## 2012-10-18 DIAGNOSIS — E785 Hyperlipidemia, unspecified: Secondary | ICD-10-CM

## 2012-10-18 DIAGNOSIS — IMO0002 Reserved for concepts with insufficient information to code with codable children: Secondary | ICD-10-CM

## 2012-10-18 DIAGNOSIS — E119 Type 2 diabetes mellitus without complications: Secondary | ICD-10-CM

## 2012-10-18 LAB — HEMOGLOBIN A1C: Hgb A1c MFr Bld: 6.4 % (ref 4.6–6.5)

## 2012-10-18 MED ORDER — LOVASTATIN 20 MG PO TABS: 20.0000 mg | ORAL_TABLET | Freq: Every day | ORAL | Status: DC

## 2012-10-18 NOTE — Patient Instructions (Addendum)
Leg Cramps Leg cramps that occur during exercise can be caused by poor circulation or dehydration. However, muscle cramps that occur at rest or during the night are usually not due to any serious medical problem. Heat cramps may cause muscle spasms during hot weather.  CAUSES There is no clear cause for muscle cramps. However, dehydration may be a factor for those who do not drink enough fluids and those who exercise in the heat. Imbalances in the level of sodium, potassium, calcium or magnesium in the muscle tissue may also be a factor. Some medications, such as water pills (diuretics), may cause loss of chemicals that the body needs (like sodium and potassium) and cause muscle cramps. TREATMENT   Make sure your diet has enough fluids and essential minerals for the muscle to work normally.  Avoid strenuous exercise for several days if you have been having frequent leg cramps.  Stretch and massage the cramped muscle for several minutes.  Some medicines may be helpful in some patients with night cramps. Only take over-the-counter or prescription medicines as directed by your caregiver. SEEK IMMEDIATE MEDICAL CARE IF:   Your leg cramps become worse.  Your foot becomes cold, numb, or blue. Document Released: 09/23/2004 Document Revised: 11/08/2011 Document Reviewed: 09/10/2008 [facility] Patient Information 2013 [place], [place].  Clean leg wounds daily with soap and water.   Follow up promptly for any redness, increased warmth, or drainage.

## 2012-10-18 NOTE — Progress Notes (Signed)
  Subjective:    Patient ID: Lisa Norton, female    DOB: February 02, 1971, 42 y.o.   MRN: [id number]  HPI  Patient seen for the following issues   Seen with questionable "bite" right lower leg. She does not recall actual bite. She had small erythematous papule few days ago which is asked reducing in size. Possibly had some mild pus draining from this 3 days ago. No cellulitis changes. No fevers or chills. No history of MRSA.  Type 2 diabetes. Needs repeat A1c. Recent loss of insurance. Hyperlipidemia. Currently takes pravastatin and requesting change to lovastatin because of cost issues with her [facility] prescriptions as pravastatin has been taken off four dollar list  Past Medical History  Diagnosis Date  . COLD SORE 05/15/2010  . TOBACCO ABUSE 12/12/2009  . DEPRESSION 12/12/2009  . HYPERTENSION 12/12/2009  . PREDIABETES 02/11/2010   Past Surgical History  Procedure Laterality Date  . Cholecystectomy  2006  . Tonsillectomy  1988    reports that she has been smoking Cigarettes.  She has a 30 pack-year smoking history. She does not have any smokeless tobacco history on file. Her alcohol and drug histories are not on file. family history is not on file. No Known Allergies    Review of Systems  Constitutional: Negative for fever and chills.  Respiratory: Negative for cough and shortness of breath.   Cardiovascular: Negative for chest pain, palpitations and leg swelling.  Skin: Positive for rash.       Objective:   Physical Exam  Constitutional: She appears well-developed and well-nourished.  Neck: Neck supple. No thyromegaly present.  Cardiovascular: Normal rate and regular rhythm.   Pulmonary/Chest: Effort normal and breath sounds normal. No respiratory distress. She has no wheezes. She has no rales.  Musculoskeletal: She exhibits no edema.  Patient has very small approximately 1-2 mm eschar right lower leg. No fluctuance. No surrounding erythema. No warmth. Nontender. No  pustules          Assessment & Plan:  #1 type 2 diabetes. History of excellent control. Recheck A1c #2 very small right leg wound. Question from shaving. No infection issues. Follow closely for signs of infection. Keep clean with soap and water #3 hyperlipidemia. Change to lovastatin 20 mg daily because of cost issues

## 2012-10-18 NOTE — Telephone Encounter (Signed)
Pt was in for OV today and this was addressed

## 2012-10-19 NOTE — Progress Notes (Signed)
Quick Note:  Pt informed on VM ______ 

## 2012-11-27 ENCOUNTER — Encounter: Payer: Self-pay | Admitting: Family Medicine

## 2012-11-27 ENCOUNTER — Ambulatory Visit (INDEPENDENT_AMBULATORY_CARE_PROVIDER_SITE_OTHER): Payer: Self-pay | Admitting: Family Medicine

## 2012-11-27 ENCOUNTER — Telehealth: Payer: Self-pay | Admitting: Family Medicine

## 2012-11-27 VITALS — BP 120/68 | Temp 98.4°F

## 2012-11-27 DIAGNOSIS — J209 Acute bronchitis, unspecified: Secondary | ICD-10-CM

## 2012-11-27 NOTE — Telephone Encounter (Signed)
Patient Information:  Caller Name: Malini  Phone: [phone]  Patient: Lisa Norton, Lisa Norton  Gender: Female  DOB: [birth year]  Age: 42 Years  PCP: Evelena Peat [facility])  Pregnant: No  Office Follow Up:  Does the office need to follow up with this patient?: No  Instructions For The Office: N/A  RN Note:  Exposed to strep by daughter. Coughing up greenish mucos. Will be caring for daughter after her surgery on 12/05/12.  Symptoms  Reason For Call & Symptoms: Chest Congestion has loose cough and yesterday productive for greenish mucos. Hx DM- takes Metformin. BG wnl. Afebrile.  Reviewed Health History In EMR: Yes  Reviewed Medications In EMR: Yes  Reviewed Allergies In EMR: Yes  Reviewed Surgeries / Procedures: Yes  Date of Onset of Symptoms: 11/25/2012  Treatments Tried: Advil Cold and Congestion  Treatments Tried Worked: Yes OB / GYN:  LMP: 11/11/2012  Guideline(s) Used:  Cough/Diabetes Respiratory Problems- Advised to see within 4 hours for "productive cough with colored sputum"  Disposition Per Guideline:   See Today or Tomorrow in Office  Reason For Disposition Reached:   Patient wants to be seen  Advice Given:  Coughing Spasms:  Drink warm fluids. Inhale warm mist (Reason: both relax the airway and loosen up the phlegm).  Suck on cough drops or hard candy to coat the irritated throat.  Reassurance  You can get a dry hacking cough after a chest cold. Sometimes this type of cough can last 1-3 weeks, and be worse at night.  Here is some care advice that should help.  Avoid Tobacco Smoke:  Smoking or being exposed to smoke makes coughs much worse.  Call Back If:  Difficulty breathing  Cough lasts more than 3 weeks  Fever lasts > 3 days  You become worse.  Patient Will Follow Care Advice:  YES  Appointment Scheduled:  11/27/2012 09:30:00 Appointment Scheduled Provider:  Evelena Peat [facility])

## 2012-11-27 NOTE — Progress Notes (Signed)
  Subjective:    Patient ID: Lisa Norton, female    DOB: 07-14-1971, 42 y.o.   MRN: [id number]  HPI Acute visit Nasal congestion and productive cough No fever.  No wheezing. No body aches.  No nausea or vomiting. No hemoptysis.    Review of Systems  Constitutional: Negative for fever and chills.  HENT: Positive for congestion, sore throat and sinus pressure.   Respiratory: Positive for cough. Negative for shortness of breath and wheezing.        Objective:   Physical Exam  Constitutional: She appears well-developed and well-nourished.  HENT:  Right Ear: External ear normal.  Left Ear: External ear normal.  Mouth/Throat: Oropharynx is clear and moist.  Neck: Neck supple.  Cardiovascular: Normal rate and regular rhythm.   Pulmonary/Chest: Effort normal and breath sounds normal. No respiratory distress. She has no wheezes. She has no rales.  Lymphadenopathy:    She has no cervical adenopathy.          Assessment & Plan:  Acute viral bronchitis. Reassurance. Continue over-the-counter medications as needed. Followup promptly for fever or worsening symptoms

## 2012-11-27 NOTE — Patient Instructions (Addendum)

## 2012-11-27 NOTE — Telephone Encounter (Signed)
Noted  

## 2013-01-01 ENCOUNTER — Telehealth: Payer: Self-pay | Admitting: Family Medicine

## 2013-01-01 NOTE — Telephone Encounter (Signed)
Patient Information:  Caller Name: Tamberly  Phone: [phone]  Patient: Lisa Norton, Lisa Norton  Gender: Female  DOB: Nov 12, 1970  Age: 42 Years  PCP: Evelena Peat [facility])  Pregnant: No  Office Follow Up:  Does the office need to follow up with this patient?: Yes  Instructions For The Office: Patient cannot afford Albuterol inhaler and medication.  Obtained ER order for medication change.  Does office have samples for inhaler to assist patient?  RN Note:  She has not contacted the ER at [facility] for medication assistance. Called ER [phone] and spoke with charge nurse Hope.  Explained patient has issues with medication prescribed by ER physician . Spoke with ER physician Dr. Kathi Simpers Md.  He substitued Zpack 250mg  two tabs today and 1 every day for 4 days, NR. He advised there is no substitiution for inhaler.  Contacted the Pharmacy [place] ,[place] [phone]. [employer] and provided order.  DOES THE OFFICE HAVE ANY ALBUTEROL INHALER SAMPLES FOR THE PATEINT.  PLEASE CONTACT FOR ASSISTANCE  Symptoms  Reason For Call & Symptoms: Patient is Emergent call.  She was in the ER [facility]) on Saturday night . She was diagnosed  with sinuitis, Bronchitis and vertigo. Given Albuterol HFA, Levaquin, meclzaine.  She was able to fill the meclazine but cannot the Albuterol and Levaquin. She is requesting medication subsitiution that she can afford- Cost $130.00  Reviewed Health History In EMR: Yes  Reviewed Medications In EMR: Yes  Reviewed Allergies In EMR: Yes  Reviewed Surgeries / Procedures: Yes  Date of Onset of Symptoms: 12/30/2012 OB / GYN:  LMP: 12/27/2012  Guideline(s) Used:  No Protocol Available - Sick Adult  Disposition Per Guideline:   Discuss with PCP and Callback by Nurse Today  Reason For Disposition Reached:   Nursing judgment  Advice Given:  Call Back If:  New symptoms develop  You become worse.  Patient Will Follow Care Advice:  YES

## 2013-01-01 NOTE — Telephone Encounter (Signed)
ProAir, Ventolin, and Proventil are all forms of albuterol and suitable if samples available.

## 2013-01-01 NOTE — Telephone Encounter (Signed)
I informed pt we do not have any samples of Albuterol.  Pt asked if I would check with Dr Caryl Never for any samples of a similar inhaler, or another Rx to recommend

## 2013-01-01 NOTE — Telephone Encounter (Signed)
We have ProAir sample, pt informed and her father Karie Schwalbe will pick-up for her.

## 2013-01-29 ENCOUNTER — Ambulatory Visit: Payer: Self-pay | Admitting: Family Medicine

## 2013-01-31 ENCOUNTER — Ambulatory Visit (INDEPENDENT_AMBULATORY_CARE_PROVIDER_SITE_OTHER): Payer: Self-pay | Admitting: Family Medicine

## 2013-01-31 ENCOUNTER — Encounter: Payer: Self-pay | Admitting: Family Medicine

## 2013-01-31 VITALS — BP 120/80 | HR 79 | Temp 98.8°F | Resp 20 | Wt 214.0 lb

## 2013-01-31 DIAGNOSIS — R5381 Other malaise: Secondary | ICD-10-CM

## 2013-01-31 DIAGNOSIS — R609 Edema, unspecified: Secondary | ICD-10-CM

## 2013-01-31 DIAGNOSIS — R5383 Other fatigue: Secondary | ICD-10-CM

## 2013-01-31 DIAGNOSIS — E119 Type 2 diabetes mellitus without complications: Secondary | ICD-10-CM

## 2013-01-31 DIAGNOSIS — R600 Localized edema: Secondary | ICD-10-CM

## 2013-01-31 LAB — BASIC METABOLIC PANEL
BUN: 16 mg/dL (ref 6–23)
CO2: 24 mEq/L (ref 19–32)
Calcium: 9.2 mg/dL (ref 8.4–10.5)
Creatinine, Ser: 0.9 mg/dL (ref 0.4–1.2)
GFR: 75.5 mL/min (ref >60.00)
Glucose, Bld: 79 mg/dL (ref 70–99)
Sodium: 139 mEq/L (ref 135–145)

## 2013-01-31 MED ORDER — TRAMADOL HCL 50 MG PO TABS: 50.0000 mg | ORAL_TABLET | Freq: Four times a day (QID) | ORAL | Status: DC | PRN

## 2013-01-31 NOTE — Progress Notes (Signed)
  Subjective:    Patient ID: Lisa Norton, female    DOB: 03/22/1971, 42 y.o.   MRN: [id number]  HPI 2 weeks hx of bilateral leg/feet edema No diet changes.  No dyspnea. No NSAIDS. Generally tries to follow low sodium diet More fatigue than usual.  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 Having frequent bilateral foot pain. No alleviating factors. Denies any significant cough. No wheezing. Still smoking  Type 2 diabetes hyperlipidemia treated with metformin and Mevacor. Blood sugars have been well controlled. Fasting blood sugar this morning 110.  Past Medical History  Diagnosis Date  . COLD SORE 05/15/2010  . TOBACCO ABUSE 12/12/2009  . DEPRESSION 12/12/2009  . HYPERTENSION 12/12/2009  . PREDIABETES 02/11/2010   Past Surgical History  Procedure Laterality Date  . Cholecystectomy  2006  . Tonsillectomy  1988    reports that she has been smoking Cigarettes.  She has a 30 pack-year smoking history. She does not have any smokeless tobacco history on file. Her alcohol and drug histories are not on file. family history is not on file. No Known Allergies    Review of Systems  Constitutional: Positive for fatigue. Negative for appetite change and unexpected weight change.  Respiratory: Negative for cough, shortness of breath and wheezing.   Cardiovascular: Positive for leg swelling. Negative for chest pain and palpitations.  Endocrine: Negative for polydipsia and polyuria.       Objective:   Physical Exam  Constitutional: She appears well-developed and well-nourished.  Neck: Neck supple. No thyromegaly present.  Cardiovascular: Normal rate and regular rhythm.   Pulmonary/Chest: Effort normal and breath sounds normal. No respiratory distress. She has no wheezes. She has no rales.  Musculoskeletal:  No pitting edema this time. 2 plus dorsalis pedis pulses bilaterally. Good capillary refill throughout          Assessment & Plan:   Peripheral edema. By exam today, no significant edema noted. Suspect related to venous stasis predominantly. Given her fatigue issues, check TSH. Also check basic metabolic panel and repeat A1c. Avoid nonsteroidals. Tramadol 50 mg every 6 hours as needed for foot pain. Elevate legs frequently. Discussed compression hose but she's not interested

## 2013-01-31 NOTE — Patient Instructions (Addendum)
Elevate legs frequently Low sodium diet.

## 2013-02-01 ENCOUNTER — Telehealth: Payer: Self-pay | Admitting: Family Medicine

## 2013-02-01 NOTE — Telephone Encounter (Signed)
Pt notified see lab result message.

## 2013-02-01 NOTE — Telephone Encounter (Signed)
Pt would like blood work results °

## 2013-03-13 ENCOUNTER — Telehealth: Payer: Self-pay | Admitting: Family Medicine

## 2013-03-13 NOTE — Telephone Encounter (Signed)
Patient Information:  Caller Name: Meila  Phone: [phone]  Patient: Lisa, Norton  Gender: Female  DOB: 10/22/1970  Age: 42 Years  PCP: Evelena Peat (Family Practice)  Pregnant: No  Office Follow Up:  Does the office need to follow up with this patient?: No  Instructions For The Office: N/A   Symptoms  Reason For Call & Symptoms: Pt is calling to say that her muscles and joints - shoulders/wrist/elbows/left hip are painful - x 3-4weeks. Pts left leg from her knee to the end of her ankle will get pins and needles. Onset several months ago.  Reviewed Health History In EMR: Yes  Reviewed Medications In EMR: Yes  Reviewed Allergies In EMR: Yes  Reviewed Surgeries / Procedures: Yes  Date of Onset of Symptoms: 01/21/2013 OB / GYN:  LMP: 02/27/2013  Guideline(s) Used:  Leg Pain  Disposition Per Guideline:   See Today or Tomorrow in Office  Reason For Disposition Reached:   Numbness in a leg or foot (i.e., loss of sensation)  Advice Given:  N/A  Patient Will Follow Care Advice:  YES  Appointment Scheduled:  03/14/2013 09:45:00 Appointment Scheduled Provider:  Evelena Peat (Family Practice)

## 2013-03-14 ENCOUNTER — Ambulatory Visit (INDEPENDENT_AMBULATORY_CARE_PROVIDER_SITE_OTHER): Payer: Self-pay | Admitting: Family Medicine

## 2013-03-14 ENCOUNTER — Encounter: Payer: Self-pay | Admitting: Family Medicine

## 2013-03-14 VITALS — BP 124/76 | HR 87 | Temp 98.3°F | Wt 217.0 lb

## 2013-03-14 DIAGNOSIS — M25519 Pain in unspecified shoulder: Secondary | ICD-10-CM

## 2013-03-14 DIAGNOSIS — M25512 Pain in left shoulder: Secondary | ICD-10-CM

## 2013-03-14 DIAGNOSIS — M25511 Pain in right shoulder: Secondary | ICD-10-CM

## 2013-03-14 MED ORDER — MELOXICAM 15 MG PO TABS: 15.0000 mg | ORAL_TABLET | Freq: Every day | ORAL | Status: DC

## 2013-03-14 NOTE — Patient Instructions (Addendum)
Touch base in 2 weeks if no better. 

## 2013-03-14 NOTE — Progress Notes (Signed)
  Subjective:    Patient ID: Lisa Norton, female    DOB: 06-27-1971, 42 y.o.   MRN: [id number]  HPI Patient seen with mostly shoulder pain left greater than right. Her job requires frequent reaching overhead. Symptoms worse especially the past few weeks. She's also had some cervical neck pain and occasional hip pains. No erythema. No visible swelling. No recent tick bites. No rash. She has not tried any medications. No cervical radiculopathy symptoms.  Also complain of occasional left thigh numbness. No weakness. No back pain. No muscle atrophy  Type 2 diabetes which has been well controlled. She stopped taking Mevacor because of diarrhea issues. She cannot afford other statins at this time.  Past Medical History  Diagnosis Date  . COLD SORE 05/15/2010  . TOBACCO ABUSE 12/12/2009  . DEPRESSION 12/12/2009  . HYPERTENSION 12/12/2009  . PREDIABETES 02/11/2010   Past Surgical History  Procedure Laterality Date  . Cholecystectomy  2006  . Tonsillectomy  1988    reports that she has been smoking Cigarettes.  She has a 30 pack-year smoking history. She does not have any smokeless tobacco history on file. Her alcohol and drug histories are not on file. family history is not on file. No Known Allergies    Review of Systems  Constitutional: Negative for fever, chills and appetite change.  Respiratory: Negative for cough.   Musculoskeletal: Positive for arthralgias. Negative for myalgias, back pain and joint swelling.  Skin: Negative for rash.       Objective:   Physical Exam  Constitutional: She appears well-developed and well-nourished.  Cardiovascular: Normal rate and regular rhythm.   Pulmonary/Chest: Effort normal and breath sounds normal. No respiratory distress. She has no wheezes. She has no rales.  Musculoskeletal:  Patient has pain with abduction of right and left shoulder greater than 90. Mild pain with internal rotation.  Neurological:  No focal weakness of upper  extremities. Symmetric reflexes          Assessment & Plan:  Bilateral shoulder pain. Suspect related to overuse with rotator cuff tendinitis. Trial of meloxicam short-term 50 mg once daily. Avoid long-term use with diabetes status.

## 2013-05-29 ENCOUNTER — Ambulatory Visit: Payer: Self-pay | Admitting: Family Medicine

## 2013-05-30 ENCOUNTER — Telehealth: Payer: Self-pay | Admitting: Family Medicine

## 2013-05-30 NOTE — Telephone Encounter (Signed)
Pt went to ed and was dx w/ planter fascitis. Needs 30 min appt in the am next week. Pt works in the afternoon. No appts e/c SD. pls avise if ok to schedule.

## 2013-05-30 NOTE — Telephone Encounter (Signed)
done

## 2013-05-30 NOTE — Telephone Encounter (Signed)
That's fine if you have to do two 15 min appts

## 2013-06-07 ENCOUNTER — Ambulatory Visit (INDEPENDENT_AMBULATORY_CARE_PROVIDER_SITE_OTHER): Payer: Self-pay | Admitting: Family Medicine

## 2013-06-07 ENCOUNTER — Encounter: Payer: Self-pay | Admitting: Family Medicine

## 2013-06-07 VITALS — BP 110/70 | HR 75 | Temp 97.6°F | Wt 213.0 lb

## 2013-06-07 DIAGNOSIS — M722 Plantar fascial fibromatosis: Secondary | ICD-10-CM

## 2013-06-07 DIAGNOSIS — Z23 Encounter for immunization: Secondary | ICD-10-CM

## 2013-06-07 MED ORDER — MELOXICAM 15 MG PO TABS: 15.0000 mg | ORAL_TABLET | Freq: Every day | ORAL | Status: DC

## 2013-06-07 NOTE — Progress Notes (Signed)
  Subjective:    Patient ID: Lisa Norton, female    DOB: August 10, 1971, 42 y.o.   MRN: [id number]  HPI Bilateral plantar fascia pain Onset about 6-8 weeks ago. She works on hard floors. 2 weeks ago she went to emergency department. Was diagnosed with plantar fasciitis. She's taken some Aleve with minimal improvement. Occasional stretches. No icing. No recent change of shoe wear. She has occasional Achilles pain but mostly plantar fascia pain. Pain worse first thing in the morning and after prolonged periods of sitting.  Past Medical History  Diagnosis Date  . COLD SORE 05/15/2010  . TOBACCO ABUSE 12/12/2009  . DEPRESSION 12/12/2009  . HYPERTENSION 12/12/2009  . PREDIABETES 02/11/2010   Past Surgical History  Procedure Laterality Date  . Cholecystectomy  2006  . Tonsillectomy  1988    reports that she has been smoking Cigarettes.  She has a 30 pack-year smoking history. She does not have any smokeless tobacco history on file. Her alcohol and drug histories are not on file. family history is not on file. No Known Allergies    Review of Systems  Constitutional: Negative for fever and chills.  Musculoskeletal: Negative for gait problem and joint swelling.  Skin: Negative for rash.       Objective:   Physical Exam  Constitutional: She appears well-developed and well-nourished.  Cardiovascular: Normal rate and regular rhythm.   No murmur heard. Musculoskeletal:  Patient has tenderness plantar fascia near attachment to calcaneus. No Achilles tenderness. Full range of motion ankle. No foot edema. No skin rash. Full range of motion with plantar flexion dorsiflexion          Assessment & Plan:  Bilateral plantar fasciitis. Refill meloxicam 15 mg once daily. Recommend regular stretches with instructions given. Recommend icing after activity and arch supports.  Discussed other things like possible corticosteroid injection at this point she wishes to observe

## 2013-06-07 NOTE — Patient Instructions (Addendum)
Plantar Fasciitis Plantar fasciitis is a common condition that causes foot pain. It is soreness (inflammation) of the band of tough fibrous tissue on the bottom of the foot that runs from the heel bone (calcaneus) to the ball of the foot. The cause of this soreness may be from excessive standing, poor fitting shoes, running on hard surfaces, being overweight, having an abnormal walk, or overuse (this is common in runners) of the painful foot or feet. It is also common in aerobic exercise dancers and ballet dancers. SYMPTOMS  Most people with plantar fasciitis complain of:  Severe pain in the morning on the bottom of their foot especially when taking the first steps out of bed. This pain recedes after a few minutes of walking.  Severe pain is experienced also during walking following a long period of inactivity.  Pain is worse when walking barefoot or up stairs DIAGNOSIS   Your caregiver will diagnose this condition by examining and feeling your foot.  Special tests such as X-rays of your foot, are usually not needed. PREVENTION   Consult a sports medicine professional before beginning a new exercise program.  Walking programs offer a good workout. With walking there is a lower chance of overuse injuries common to runners. There is less impact and less jarring of the joints.  Begin all new exercise programs slowly. If problems or pain develop, decrease the amount of time or distance until you are at a comfortable level.  Wear good shoes and replace them regularly.  Stretch your foot and the heel cords at the back of the ankle (Achilles tendon) both before and after exercise.  Run or exercise on even surfaces that are not hard. For example, asphalt is better than pavement.  Do not run barefoot on hard surfaces.  If using a treadmill, vary the incline.  Do not continue to workout if you have foot or joint problems. Seek professional help if they do not improve. HOME CARE INSTRUCTIONS     Avoid activities that cause you pain until you recover.  Use ice or cold packs on the problem or painful areas after working out.  Only take over-the-counter or prescription medicines for pain, discomfort, or fever as directed by your caregiver.  Soft shoe inserts or athletic shoes with air or gel sole cushions may be helpful.  If problems continue or become more severe, consult a sports medicine caregiver or your own health care provider. Cortisone is a potent anti-inflammatory medication that may be injected into the painful area. You can discuss this treatment with your caregiver. MAKE SURE YOU:   Understand these instructions.  Will watch your condition.  Will get help right away if you are not doing well or get worse. Document Released: 05/11/2001 Document Revised: 11/08/2011 Document Reviewed: 07/10/2008 [facility] Patient Information 2014 [place], [place].  Stretch your foot several times daily Consider heel cup or arch support.

## 2013-09-11 ENCOUNTER — Ambulatory Visit (INDEPENDENT_AMBULATORY_CARE_PROVIDER_SITE_OTHER): Payer: Self-pay | Admitting: Family Medicine

## 2013-09-11 ENCOUNTER — Encounter: Payer: Self-pay | Admitting: Family Medicine

## 2013-09-11 VITALS — BP 140/80 | Temp 98.3°F | Wt 215.0 lb

## 2013-09-11 DIAGNOSIS — S61218A Laceration without foreign body of other finger without damage to nail, initial encounter: Secondary | ICD-10-CM | POA: Insufficient documentation

## 2013-09-11 DIAGNOSIS — S61209A Unspecified open wound of unspecified finger without damage to nail, initial encounter: Secondary | ICD-10-CM

## 2013-09-11 NOTE — Progress Notes (Signed)
Pre visit review using our clinic review tool, if applicable. No additional management support is needed unless otherwise documented below in the visit note. 

## 2013-09-11 NOTE — Patient Instructions (Signed)
Keep the dressing clean and dry until Thursday night. Thursday night remove in and leave it open to the air  Return when necessary

## 2013-09-11 NOTE — Progress Notes (Signed)
   Subjective:    Patient ID: KMYA PLACIDE, female    DOB: 11/12/1970, 43 y.o.   MRN: [id number]  HPI Lisa Norton is a 43 year old female who has underlying diabetes hypertension and tobacco abuse who comes in today for a superficial laceration of her right index finger  She states this morning and it was sticking out and she cut her right index finger.  Tetanus booster 2011   Review of Systems    view of systems negative Objective:   Physical Exam  Well-developed well-nourished female no acute distress examination Kyung Rudd Your shows a superficial laceration on the palmar surface.      Assessment & Plan:  Superficial laceration right index finger,,,,, cleaned with alcohol and splinted

## 2013-09-29 ENCOUNTER — Telehealth: Payer: Self-pay | Admitting: Family Medicine

## 2013-09-29 NOTE — Telephone Encounter (Signed)
Relevant patient education mailed to patient.  

## 2013-10-04 ENCOUNTER — Telehealth: Payer: Self-pay | Admitting: Family Medicine

## 2013-10-04 DIAGNOSIS — E119 Type 2 diabetes mellitus without complications: Secondary | ICD-10-CM

## 2013-10-04 NOTE — Telephone Encounter (Signed)
Do you want pt to have any other labs?

## 2013-10-04 NOTE — Addendum Note (Signed)
Addended by: Marcina Millard on: 10/04/2013 02:41 PM   Modules accepted: Orders

## 2013-10-04 NOTE — Telephone Encounter (Signed)
Orders are placed. Can you please the pt.

## 2013-10-04 NOTE — Telephone Encounter (Signed)
I only see that he wanted her A1c to be done. Order is placed.

## 2013-10-04 NOTE — Telephone Encounter (Signed)
Pt was suppose to come back in aug 2014 for a1c. Can I sch? Also does md want and additional labs

## 2013-10-04 NOTE — Telephone Encounter (Signed)
Needs A1C, urine micoralbumin/cr ration, and lipid

## 2013-10-05 ENCOUNTER — Other Ambulatory Visit: Payer: Self-pay

## 2013-10-08 ENCOUNTER — Other Ambulatory Visit (INDEPENDENT_AMBULATORY_CARE_PROVIDER_SITE_OTHER): Payer: Self-pay

## 2013-10-08 DIAGNOSIS — E119 Type 2 diabetes mellitus without complications: Secondary | ICD-10-CM

## 2013-10-08 LAB — LDL CHOLESTEROL, DIRECT: Direct LDL: 166.1 mg/dL

## 2013-10-08 LAB — LIPID PANEL
Cholesterol: 207 mg/dL — ABNORMAL HIGH (ref 0–200)
HDL: 22.7 mg/dL — ABNORMAL LOW (ref >39.00)
Total CHOL/HDL Ratio: 9
Triglycerides: 151 mg/dL — ABNORMAL HIGH (ref 0.0–149.0)
VLDL: 30.2 mg/dL (ref 0.0–40.0)

## 2013-10-08 LAB — MICROALBUMIN / CREATININE URINE RATIO
CREATININE, U: 67.9 mg/dL
Microalb Creat Ratio: 0.3 mg/g (ref 0.0–30.0)
Microalb, Ur: 0.2 mg/dL (ref 0.0–1.9)

## 2013-10-08 LAB — HEMOGLOBIN A1C: HEMOGLOBIN A1C: 6.4 % (ref 4.6–6.5)

## 2013-10-09 ENCOUNTER — Other Ambulatory Visit: Payer: Self-pay | Admitting: Family Medicine

## 2013-10-09 ENCOUNTER — Telehealth: Payer: Self-pay

## 2013-10-09 DIAGNOSIS — E785 Hyperlipidemia, unspecified: Secondary | ICD-10-CM

## 2013-10-09 NOTE — Telephone Encounter (Signed)
I dont know based on her insurance what would cost less. Let's try Pravastatin 40 mg once daily. OK to refill chantix-starter pack and maintenance pack with one refill.

## 2013-10-09 NOTE — Telephone Encounter (Signed)
Pt stated that the Lipitor cost to much is there anything else she can take that would cost less. And she also wants a refill on her Chantix, I dont see that she is currently taking the Chantix.

## 2013-10-10 MED ORDER — VARENICLINE TARTRATE 1 MG PO TABS: 1.0000 mg | ORAL_TABLET | Freq: Two times a day (BID) | ORAL | Status: DC

## 2013-10-10 MED ORDER — VARENICLINE TARTRATE 0.5 MG X 11 & 1 MG X 42 PO MISC: ORAL | Status: DC

## 2013-10-10 MED ORDER — PRAVASTATIN SODIUM 40 MG PO TABS: 40.0000 mg | ORAL_TABLET | Freq: Every day | ORAL | Status: DC

## 2013-10-10 NOTE — Telephone Encounter (Signed)
Pt aware that RX is sent to [facility]

## 2013-10-10 NOTE — Telephone Encounter (Signed)
Left message for patient to return call.

## 2013-10-30 ENCOUNTER — Encounter: Payer: Self-pay | Admitting: Family Medicine

## 2013-10-30 ENCOUNTER — Ambulatory Visit (INDEPENDENT_AMBULATORY_CARE_PROVIDER_SITE_OTHER): Payer: Self-pay | Admitting: Family Medicine

## 2013-10-30 ENCOUNTER — Telehealth: Payer: Self-pay | Admitting: Family Medicine

## 2013-10-30 VITALS — BP 112/66 | HR 85 | Temp 97.8°F | Wt 222.0 lb

## 2013-10-30 DIAGNOSIS — R002 Palpitations: Secondary | ICD-10-CM

## 2013-10-30 MED ORDER — BUPROPION HCL ER (SR) 150 MG PO TB12: 150.0000 mg | ORAL_TABLET | Freq: Two times a day (BID) | ORAL | Status: DC

## 2013-10-30 NOTE — Patient Instructions (Addendum)
Palpitations  A palpitation is the feeling that your heartbeat is irregular or is faster than normal. It may feel like your heart is fluttering or skipping a beat. Palpitations are usually not a serious problem. However, in some cases, you may need further medical evaluation. CAUSES  Palpitations can be caused by:  Smoking.  Caffeine or other stimulants, such as diet pills or energy drinks.  Alcohol.  Stress and anxiety.  Strenuous physical activity.  Fatigue.  Certain medicines.  Heart disease, especially if you have a history of arrhythmias. This includes atrial fibrillation, atrial flutter, or supraventricular tachycardia.  An improperly working pacemaker or defibrillator. DIAGNOSIS  To find the cause of your palpitations, your caregiver will take your history and perform a physical exam. Tests may also be done, including:  Electrocardiography (ECG). This test records the heart's electrical activity.  Cardiac monitoring. This allows your caregiver to monitor your heart rate and rhythm in real time.  Holter monitor. This is a portable device that records your heartbeat and can help diagnose heart arrhythmias. It allows your caregiver to track your heart activity for several days, if needed.  Stress tests by exercise or by giving medicine that makes the heart beat faster. TREATMENT  Treatment of palpitations depends on the cause of your symptoms and can vary greatly. Most cases of palpitations do not require any treatment other than time, relaxation, and monitoring your symptoms. Other causes, such as atrial fibrillation, atrial flutter, or supraventricular tachycardia, usually require further treatment. HOME CARE INSTRUCTIONS   Avoid:  Caffeinated coffee, tea, soft drinks, diet pills, and energy drinks.  Chocolate.  Alcohol.  Stop smoking if you smoke.  Reduce your stress and anxiety. Things that can help you relax include:  A method that measures bodily functions so  you can learn to control them (biofeedback).  Yoga.  Meditation.  Physical activity such as swimming, jogging, or walking.  Get plenty of rest and sleep. SEEK MEDICAL CARE IF:   You continue to have a fast or irregular heartbeat beyond 24 hours.  Your palpitations occur more often. SEEK IMMEDIATE MEDICAL CARE IF:  You develop chest pain or shortness of breath.  You have a severe headache.  You feel dizzy, or you faint. MAKE SURE YOU:  Understand these instructions.  Will watch your condition.  Will get help right away if you are not doing well or get worse. Document Released: 08/13/2000 Document Revised: 12/11/2012 Document Reviewed: 10/15/2011 [facility] Patient Information 2014 [place]. Insomnia Insomnia is frequent trouble falling and/or staying asleep. Insomnia can be a long term problem or a short term problem. Both are common. Insomnia can be a short term problem when the wakefulness is related to a certain stress or worry. Long term insomnia is often related to ongoing stress during waking hours and/or poor sleeping habits. Overtime, sleep deprivation itself can make the problem worse. Every little thing feels more severe because you are overtired and your ability to cope is decreased. CAUSES   Stress, anxiety, and depression.  Poor sleeping habits.  Distractions such as TV in the bedroom.  Naps close to bedtime.  Engaging in emotionally charged conversations before bed.  Technical reading before sleep.  Alcohol and other sedatives. They may make the problem worse. They can hurt normal sleep patterns and normal dream activity.  Stimulants such as caffeine for several hours prior to bedtime.  Pain syndromes and shortness of breath can cause insomnia.  Exercise late at night.  Changing time zones may cause  sleeping problems (jet lag). It is sometimes helpful to have someone observe your sleeping patterns. They should look for periods of not breathing  during the night (sleep apnea). They should also look to see how long those periods last. If you live alone or observers are uncertain, you can also be observed at a sleep clinic where your sleep patterns will be professionally monitored. Sleep apnea requires a checkup and treatment. Give your caregivers your medical history. Give your caregivers observations your family has made about your sleep.  SYMPTOMS   Not feeling rested in the morning.  Anxiety and restlessness at bedtime.  Difficulty falling and staying asleep. TREATMENT   Your caregiver may prescribe treatment for an underlying medical disorders. Your caregiver can give advice or help if you are using alcohol or other drugs for self-medication. Treatment of underlying problems will usually eliminate insomnia problems.  Medications can be prescribed for short time use. They are generally not recommended for lengthy use.  Over-the-counter sleep medicines are not recommended for lengthy use. They can be habit forming.  You can promote easier sleeping by making lifestyle changes such as:  Using relaxation techniques that help with breathing and reduce muscle tension.  Exercising earlier in the day.  Changing your diet and the time of your last meal. No night time snacks.  Establish a regular time to go to bed.  Counseling can help with stressful problems and worry.  Soothing music and white noise may be helpful if there are background noises you cannot remove.  Stop tedious detailed work at least one hour before bedtime. HOME CARE INSTRUCTIONS   Keep a diary. Inform your caregiver about your progress. This includes any medication side effects. See your caregiver regularly. Take note of:  Times when you are asleep.  Times when you are awake during the night.  The quality of your sleep.  How you feel the next day. This information will help your caregiver care for you.  Get out of bed if you are still awake after 15  minutes. Read or do some quiet activity. Keep the lights down. Wait until you feel sleepy and go back to bed.  Keep regular sleeping and waking hours. Avoid naps.  Exercise regularly.  Avoid distractions at bedtime. Distractions include watching television or engaging in any intense or detailed activity like attempting to balance the household checkbook.  Develop a bedtime ritual. Keep a familiar routine of bathing, brushing your teeth, climbing into bed at the same time each night, listening to soothing music. Routines increase the success of falling to sleep faster.  Use relaxation techniques. This can be using breathing and muscle tension release routines. It can also include visualizing peaceful scenes. You can also help control troubling or intruding thoughts by keeping your mind occupied with boring or repetitive thoughts like the old concept of counting sheep. You can make it more creative like imagining planting one beautiful flower after another in your backyard garden.  During your day, work to eliminate stress. When this is not possible use some of the previous suggestions to help reduce the anxiety that accompanies stressful situations. MAKE SURE YOU:   Understand these instructions.  Will watch your condition.  Will get help right away if you are not doing well or get worse. Document Released: 08/13/2000 Document Revised: 11/08/2011 Document Reviewed: 09/13/2007 [facility] Patient Information 2014 [place].

## 2013-10-30 NOTE — Telephone Encounter (Signed)
Relevant patient education mailed to patient.  

## 2013-10-30 NOTE — Progress Notes (Signed)
   Subjective:    Patient ID: Lisa Norton, female    DOB: 1971-05-31, 43 y.o.   MRN: [id number]  Palpitations  Pertinent negatives include no chest pain, coughing, dizziness, shortness of breath or weakness.   Patient seen with chief complaint of palpitations. Visiting occurring for several months. Possibly increased in frequency over past couple of weeks. She has several episodes per day where she has sensation of possible skipped beats along with palpitation and irregular beats. She sometimes has associated fleeting sharp chest pain which only last a few seconds. She's never had any syncope. No significant dizziness. No clear triggers. She does drink caffeine in the form of colas. No exertional symptoms. She does not have a history of CAD. Her major risk factors are type 2 diabetes, hyperlipidemia, and ongoing nicotine use.  Past Medical History  Diagnosis Date  . COLD SORE 05/15/2010  . TOBACCO ABUSE 12/12/2009  . DEPRESSION 12/12/2009  . HYPERTENSION 12/12/2009  . PREDIABETES 02/11/2010   Past Surgical History  Procedure Laterality Date  . Cholecystectomy  2006  . Tonsillectomy  1988    reports that she has been smoking Cigarettes.  She has a 30 pack-year smoking history. She does not have any smokeless tobacco history on file. Her alcohol and drug histories are not on file. family history is not on file. No Known Allergies    Review of Systems  Constitutional: Negative for fatigue.  Eyes: Negative for visual disturbance.  Respiratory: Negative for cough, chest tightness, shortness of breath and wheezing.   Cardiovascular: Positive for palpitations. Negative for chest pain and leg swelling.  Neurological: Negative for dizziness, seizures, syncope, weakness, light-headedness and headaches.       Objective:   Physical Exam  Constitutional: She appears well-developed and well-nourished.  Neck: Neck supple. No thyromegaly present.  Cardiovascular: Normal rate and regular  rhythm.   Pulmonary/Chest: Effort normal and breath sounds normal. No respiratory distress. She has no wheezes. She has no rales.  Musculoskeletal: She exhibits no edema.          Assessment & Plan:  Palpitations. Patient has normal exam at this time. Check EKG. Consider event monitor. Reduce caffeine use. We discussed possible use of low-dose beta blocker but would prefer that she reduce caffeine intake first  EKG reveals normal sinus rhythm with no acute findings. No visible PVCs or PACs

## 2013-10-30 NOTE — Progress Notes (Signed)
Pre visit review using our clinic review tool, if applicable. No additional management support is needed unless otherwise documented below in the visit note. 

## 2013-11-19 ENCOUNTER — Telehealth: Payer: Self-pay | Admitting: Family Medicine

## 2013-11-19 MED ORDER — DIPHENHYDRAMINE HCL 50 MG PO TABS: 50.0000 mg | ORAL_TABLET | Freq: Every evening | ORAL | Status: DC | PRN

## 2013-11-19 NOTE — Telephone Encounter (Signed)
Pt would like to talk with md .Pt decline to elaborate the reason

## 2013-11-19 NOTE — Telephone Encounter (Signed)
Pt stated that she cannot sleep or eat. Her husband Gypsy Balsam just up and left and she hasnt been able to get in touch with him. She does know that he is in a neighborhood that is known for jobs. Pt wants to know is there anything that you can prescribe for sleep.

## 2013-11-19 NOTE — Telephone Encounter (Signed)
If she has not tried, I would start with benadryl 50 mg po qhs prn.

## 2013-11-19 NOTE — Telephone Encounter (Signed)
Pt informed and RX sent to pharmacy  

## 2013-11-22 ENCOUNTER — Telehealth: Payer: Self-pay

## 2013-11-22 NOTE — Telephone Encounter (Signed)
Pt states that she "end my life." She says that she and her boyfriend are both pt's of Dr. Elease Hashimoto and that she wanted them to come into the office together to talk to him but the boyfriend up and left last Thursday. She says that she has been crying all day and night since he left and she can't eat or sleep. Pt is currently crying on the phone and says she now has a cold because she has been crying so much. I advised pt the she needs to go to the ER for a psych evaluation immediately. 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 She says she has to be to work at 2 but really doesn't want to go and doesn't care because all she does is cry. Pt says that she will go in the morning. I strongly advised pt to go to the ER right now instead of work. She says she will go in the morning follow up with, "If I didn't want help, I wouldn't have called." I advised pt that I would let Dr. Elease Hashimoto know that I spoke with her.   Pt's PCP is aware

## 2013-11-26 ENCOUNTER — Telehealth: Payer: Self-pay | Admitting: Family Medicine

## 2013-11-26 NOTE — Telephone Encounter (Signed)
Pt is requesting to speak directly with dr. Elease Hashimoto asap, states its in regards to her depression. Pt declined triage/appt.

## 2013-11-26 NOTE — Telephone Encounter (Signed)
Pt wants to talk to you. Pt want really say exactly but she does get emotionally when talking.

## 2013-11-26 NOTE — Telephone Encounter (Signed)
Long talk with patient.  Her boyfriend of 16 years just left.  She is very upset.  No suicidal ideation.  I have recommended that she be assessed and she will consider.

## 2014-01-14 ENCOUNTER — Emergency Department (HOSPITAL_COMMUNITY)
Admission: EM | Admit: 2014-01-14 | Discharge: 2014-01-14 | Disposition: A | Discharge status: Home / Self Care | Payer: Self-pay | Attending: Emergency Medicine | Admitting: Emergency Medicine

## 2014-01-14 ENCOUNTER — Telehealth: Payer: Self-pay | Admitting: Family Medicine

## 2014-01-14 ENCOUNTER — Encounter (HOSPITAL_COMMUNITY): Payer: Self-pay | Admitting: Emergency Medicine

## 2014-01-14 ENCOUNTER — Emergency Department (HOSPITAL_COMMUNITY): Payer: Self-pay

## 2014-01-14 DIAGNOSIS — R079 Chest pain, unspecified: Secondary | ICD-10-CM | POA: Insufficient documentation

## 2014-01-14 DIAGNOSIS — F329 Major depressive disorder, single episode, unspecified: Secondary | ICD-10-CM | POA: Insufficient documentation

## 2014-01-14 DIAGNOSIS — Z79899 Other long term (current) drug therapy: Secondary | ICD-10-CM | POA: Insufficient documentation

## 2014-01-14 DIAGNOSIS — Z8619 Personal history of other infectious and parasitic diseases: Secondary | ICD-10-CM | POA: Insufficient documentation

## 2014-01-14 DIAGNOSIS — R110 Nausea: Secondary | ICD-10-CM | POA: Insufficient documentation

## 2014-01-14 DIAGNOSIS — F3289 Other specified depressive episodes: Secondary | ICD-10-CM | POA: Insufficient documentation

## 2014-01-14 DIAGNOSIS — F17200 Nicotine dependence, unspecified, uncomplicated: Secondary | ICD-10-CM | POA: Insufficient documentation

## 2014-01-14 DIAGNOSIS — N390 Urinary tract infection, site not specified: Secondary | ICD-10-CM | POA: Insufficient documentation

## 2014-01-14 DIAGNOSIS — I10 Essential (primary) hypertension: Secondary | ICD-10-CM | POA: Insufficient documentation

## 2014-01-14 DIAGNOSIS — R002 Palpitations: Secondary | ICD-10-CM | POA: Insufficient documentation

## 2014-01-14 LAB — BASIC METABOLIC PANEL
BUN: 14 mg/dL (ref 6–23)
CO2: 27 meq/L (ref 19–32)
Calcium: 9.1 mg/dL (ref 8.4–10.5)
Chloride: 101 mEq/L (ref 96–112)
Creatinine, Ser: 0.76 mg/dL (ref 0.50–1.10)
GFR calc Af Amer: >90 mL/min (ref >90)
Glucose, Bld: 118 mg/dL — ABNORMAL HIGH (ref 70–99)
POTASSIUM: 4 meq/L (ref 3.7–5.3)
Sodium: 138 mEq/L (ref 137–147)

## 2014-01-14 LAB — CBC WITH DIFFERENTIAL/PLATELET
Basophils Absolute: 0 10*3/uL (ref 0.0–0.1)
Basophils Relative: 0 % (ref 0–1)
EOS ABS: 0.1 10*3/uL (ref 0.0–0.7)
Eosinophils Relative: 2 % (ref 0–5)
HCT: 44.1 % (ref 36.0–46.0)
Hemoglobin: 14.8 g/dL (ref 12.0–15.0)
LYMPHS PCT: 21 % (ref 12–46)
Lymphs Abs: 1.9 10*3/uL (ref 0.7–4.0)
MCH: 31.8 pg (ref 26.0–34.0)
MCHC: 33.6 g/dL (ref 30.0–36.0)
MCV: 94.8 fL (ref 78.0–100.0)
MONOS PCT: 6 % (ref 3–12)
Monocytes Absolute: 0.6 10*3/uL (ref 0.1–1.0)
Neutro Abs: 6.4 10*3/uL (ref 1.7–7.7)
Neutrophils Relative %: 71 % (ref 43–77)
Platelets: 206 10*3/uL (ref 150–400)
RBC: 4.65 MIL/uL (ref 3.87–5.11)
RDW: 14.4 % (ref 11.5–15.5)
WBC: 9 10*3/uL (ref 4.0–10.5)

## 2014-01-14 LAB — URINALYSIS, ROUTINE W REFLEX MICROSCOPIC
BILIRUBIN URINE: NEGATIVE
Glucose, UA: NEGATIVE mg/dL
HGB URINE DIPSTICK: NEGATIVE
Ketones, ur: NEGATIVE mg/dL
Nitrite: NEGATIVE
PH: 7 (ref 5.0–8.0)
Protein, ur: NEGATIVE mg/dL
SPECIFIC GRAVITY, URINE: 1.015 (ref 1.005–1.030)
Urobilinogen, UA: 1 mg/dL (ref 0.0–1.0)

## 2014-01-14 LAB — URINE MICROSCOPIC-ADD ON

## 2014-01-14 LAB — LIPASE, BLOOD: Lipase: 33 U/L (ref 11–59)

## 2014-01-14 LAB — TROPONIN I: Troponin I: <0.3 ng/mL (ref <0.30)

## 2014-01-14 MED ORDER — CEPHALEXIN 500 MG PO CAPS: 500.0000 mg | ORAL_CAPSULE | Freq: Four times a day (QID) | ORAL | Status: DC

## 2014-01-14 MED ORDER — ASPIRIN 81 MG PO CHEW
324.0000 mg | CHEWABLE_TABLET | Freq: Once | ORAL | Status: AC
  Administered 2014-01-14: 324 mg via ORAL
  Filled 2014-01-14: qty 4

## 2014-01-14 MED ORDER — HYDROCODONE-ACETAMINOPHEN 5-325 MG PO TABS: 1.0000 | ORAL_TABLET | ORAL | Status: DC | PRN

## 2014-01-14 MED ORDER — PROMETHAZINE HCL 12.5 MG PO TABS: 25.0000 mg | ORAL_TABLET | Freq: Four times a day (QID) | ORAL | Status: DC | PRN

## 2014-01-14 MED ORDER — PROMETHAZINE HCL 12.5 MG PO TABS
12.5000 mg | ORAL_TABLET | Freq: Once | ORAL | Status: AC
  Administered 2014-01-14: 12.5 mg via ORAL
  Filled 2014-01-14: qty 1

## 2014-01-14 MED ORDER — HYDROCODONE-ACETAMINOPHEN 5-325 MG PO TABS
2.0000 | ORAL_TABLET | Freq: Once | ORAL | Status: AC
  Administered 2014-01-14: 2 via ORAL
  Filled 2014-01-14: qty 2

## 2014-01-14 NOTE — ED Notes (Signed)
MD at bedside. 

## 2014-01-14 NOTE — Discharge Instructions (Signed)
Your electrocardiogram and cardiac enzymes are negative for acute event. Your lab work shows that you have a urinary tract infection. Please use Keflex 4 times daily until all taken. Please have your urine rechecked in 7-10 days. Please see your primary physician for additional cardiac workup. Please return to the emergency department if any emergent changes or concerns. Chest Pain (Nonspecific) Chest pain has many causes. Your pain could be caused by something serious, such as a heart attack or a blood clot in the lungs. It could also be caused by something less serious, such as a chest bruise or a virus. Follow up with your doctor. More lab tests or other studies may be needed to find the cause of your pain. Most of the time, nonspecific chest pain will improve within 2 to 3 days of rest and mild pain medicine. HOME CARE  For chest bruises, you may put ice on the sore area for 15-20 minutes, 03-04 times a day. Do this only if it makes you feel better.  Put ice in a plastic bag.  Place a towel between the skin and the bag.  Rest for the next 2 to 3 days.  Go back to work if the pain improves.  See your doctor if the pain lasts longer than 1 to 2 weeks.  Only take medicine as told by your doctor.  Quit smoking if you smoke. GET HELP RIGHT AWAY IF:   There is more pain or pain that spreads to the arm, neck, jaw, back, or belly (abdomen).  You have shortness of breath.  You cough more than usual or cough up blood.  You have very bad back or belly pain, feel sick to your stomach (nauseous), or throw up (vomit).  You have very bad weakness.  You pass out (faint).  You have a fever. Any of these problems may be serious and may be an emergency. Do not wait to see if the problems will go away. Get medical help right away. Call your local emergency services 911 in [place] Do not drive yourself to the hospital. MAKE SURE YOU:   Understand these instructions.  Will watch this  condition.  Will get help right away if you or your child is not doing well or gets worse. Document Released: 02/02/2008 Document Revised: 11/08/2011 Document Reviewed: 02/02/2008 [facility] Patient Information 2014 [place], [place].

## 2014-01-14 NOTE — Telephone Encounter (Signed)
[facility] [place] - [address] is requesting re-fill on metFORMIN (GLUCOPHAGE) 500 MG tablet °

## 2014-01-14 NOTE — ED Provider Notes (Signed)
CSN: [id number]     Arrival date & time 01/14/14  0902 History   First MD Initiated Contact with Patient 01/14/14 1014     Chief Complaint  Patient presents with  . Chest Pain     (Consider location/radiation/quality/duration/timing/severity/associated sxs/prior Treatment) Patient is a 43 y.o. female presenting with chest pain. The history is provided by the patient.  Chest Pain Pain quality: sharp   Pain radiates to:  L shoulder Pain radiates to the back: yes   Pain severity:  Moderate Duration:  2 months Timing:  Intermittent Progression:  Worsening Chronicity:  Recurrent Context: movement and stress   Relieved by:  Nothing Worsened by:  Nothing tried Associated symptoms: anxiety, nausea and palpitations   Associated symptoms: no numbness, no orthopnea, no shortness of breath and no syncope   Associated symptoms comment:  Not sleeping well Risk factors: diabetes mellitus, hypertension and smoking   Risk factors: no birth control     Past Medical History  Diagnosis Date  . COLD SORE 05/15/2010  . TOBACCO ABUSE 12/12/2009  . DEPRESSION 12/12/2009  . HYPERTENSION 12/12/2009  . PREDIABETES 02/11/2010   Past Surgical History  Procedure Laterality Date  . Cholecystectomy  2006  . Tonsillectomy  1988   No family history on file. History  Substance Use Topics  . Smoking status: Current Every Day Smoker -- 1.50 packs/day for 20 years    Types: Cigarettes  . Smokeless tobacco: Not on file  . Alcohol Use: Yes   OB History   Grav Para Term Preterm Abortions TAB SAB Ect Mult Living                 Review of Systems  Respiratory: Negative for shortness of breath.   Cardiovascular: Positive for chest pain and palpitations. Negative for orthopnea and syncope.  Gastrointestinal: Positive for nausea.  Neurological: Negative for numbness.      Allergies  Review of patient's allergies indicates no known allergies.  Home Medications   Prior to Admission medications    Medication Sig Start Date End Date Taking? Authorizing Provider  buPROPion (WELLBUTRIN SR) 150 MG 12 hr tablet Take 1 tablet (150 mg total) by mouth 2 (two) times daily. 10/30/13   Eulas Post, MD  diphenhydrAMINE (BENADRYL) 50 MG tablet Take 1 tablet (50 mg total) by mouth at bedtime as needed. 11/19/13   Eulas Post, MD  glucose blood (ONE TOUCH ULTRA TEST) test strip Use as instructed daily 10/08/11   Eulas Post, MD  metFORMIN (GLUCOPHAGE) 500 MG tablet TAKE ONE TABLET BY MOUTH TWICE DAILY WITH  A  MEAL 10/16/12   Eulas Post, MD  [facility] DELICA LANCETS MISC Use as instructed daily 10/08/11   Eulas Post, MD  pravastatin (PRAVACHOL) 40 MG tablet Take 1 tablet (40 mg total) by mouth daily. 10/10/13   Eulas Post, MD  valACYclovir (VALTREX) 1000 MG tablet Use as directed for cold sores 06/26/12   Eulas Post, MD  varenicline (CHANTIX PAK) 0.5 MG X 11 & 1 MG X 42 tablet Take one 0.5 mg tablet by mouth once daily for 3 days, then increase to one 0.5 mg tablet twice daily for 4 days, then increase to one 1 mg tablet twice daily. 10/10/13   Eulas Post, MD  varenicline (CHANTIX) 1 MG tablet Take 1 tablet (1 mg total) by mouth 2 (two) times daily. 10/10/13   Eulas Post, MD   BP 145/81  Pulse 80  Temp(Src)  98.1 F (36.7 C)  Resp 20  Ht 5\' 2"  (1.575 m)  Wt 214 lb (97.07 kg)  BMI 39.13 kg/m2  SpO2 98%  LMP 12/17/2013 Physical Exam  Nursing note and vitals reviewed. Constitutional: She is oriented to person, place, and time. She appears well-developed and well-nourished.  Non-toxic appearance.  HENT:  Head: Normocephalic.  Right Ear: Tympanic membrane and external ear normal.  Left Ear: Tympanic membrane and external ear normal.  Eyes: EOM and lids are normal. Pupils are equal, round, and reactive to light.  Neck: Normal range of motion. Neck supple. Carotid bruit is not present.  Cardiovascular: Normal rate, regular rhythm, normal heart sounds,  intact distal pulses and normal pulses.   Pulmonary/Chest: Breath sounds normal. No respiratory distress.  Chaperone present during examination. No chest wall or sternal area tenderness.  Abdominal: Soft. Bowel sounds are normal. There is no tenderness. There is no guarding.  Musculoskeletal: Normal range of motion.  Lymphadenopathy:       Head (right side): No submandibular adenopathy present.       Head (left side): No submandibular adenopathy present.    She has no cervical adenopathy.  Neurological: She is alert and oriented to person, place, and time. She has normal strength. No cranial nerve deficit or sensory deficit.  Skin: Skin is warm and dry.  Psychiatric: Her speech is normal. Her mood appears anxious.    ED Course  Procedures (including critical care time) Labs Review Labs Reviewed - No data to display  Imaging Review No results found.   EKG Interpretation None      MDM Electrocardiogram shows a normal sinus rhythm. Cardiac monitor shows an occasional premature beat. Troponin was normal at less than 0.30. Complete blood count was normal. Basic metabolic panel was well within normal limits. Lipase was normal at 33. Urinalysis shows a hazy yellow specimen with a specific gravity 1.015, there were moderate leukocytes present 21-50 white blood cells present and a few bacteria. A culture of the urine was sent to the lab. Chest x-ray reveal no edema or consolidation present. This problem has been going on for more than a month. Patient states that it seemed to get worse recently. She's been seen by her primary physician and no emergent event was noted. Additional testing was suggested, but the patient declined at this time for financial reasons. The examination and testing today is also negative for acute event. Patient very anxious, and states that she is under a great deal of pressure distress. Examination of labs is consistent with urinary tract infection. Patient will be  treated with Keflex, Norco, and promethazine. Patient is to see her primary physician or return to the emergency department immediately if any changes, shortness of breath, chest pain, uncontrolled nausea vomiting, or deterioration in condition.     Final diagnoses:  None    *I have reviewed nursing notes, vital signs, and all appropriate lab and imaging results for this patient.Lenox Ahr, PA-C 01/14/14 1925

## 2014-01-14 NOTE — ED Notes (Signed)
Pt states she has had chest pain for two weeks. States she has been to her doctor but he didn't find anything wrong with her. States he wanted her to wear a machine that cost  $ 600.00 and she can't afford that

## 2014-01-15 LAB — URINE CULTURE

## 2014-01-15 MED ORDER — METFORMIN HCL 500 MG PO TABS: ORAL_TABLET | ORAL | Status: DC

## 2014-01-15 NOTE — Telephone Encounter (Signed)
Rx sent to pharmacy   

## 2014-01-16 NOTE — ED Provider Notes (Signed)
Medical screening examination/treatment/procedure(s) were performed by non-physician practitioner and as supervising physician I was immediately available for consultation/collaboration.   EKG Interpretation None        Kitiara Hintze L Durelle Zepeda, MD 01/16/14 1310 

## 2014-01-20 ENCOUNTER — Encounter (HOSPITAL_BASED_OUTPATIENT_CLINIC_OR_DEPARTMENT_OTHER): Payer: Self-pay | Admitting: Emergency Medicine

## 2014-01-20 ENCOUNTER — Emergency Department (HOSPITAL_BASED_OUTPATIENT_CLINIC_OR_DEPARTMENT_OTHER)
Admission: EM | Admit: 2014-01-20 | Discharge: 2014-01-20 | Disposition: A | Discharge status: Home / Self Care | Payer: Self-pay | Attending: Emergency Medicine | Admitting: Emergency Medicine

## 2014-01-20 DIAGNOSIS — I10 Essential (primary) hypertension: Secondary | ICD-10-CM | POA: Insufficient documentation

## 2014-01-20 DIAGNOSIS — R209 Unspecified disturbances of skin sensation: Secondary | ICD-10-CM | POA: Insufficient documentation

## 2014-01-20 DIAGNOSIS — M79609 Pain in unspecified limb: Secondary | ICD-10-CM | POA: Insufficient documentation

## 2014-01-20 DIAGNOSIS — Z79899 Other long term (current) drug therapy: Secondary | ICD-10-CM | POA: Insufficient documentation

## 2014-01-20 DIAGNOSIS — M79602 Pain in left arm: Secondary | ICD-10-CM

## 2014-01-20 DIAGNOSIS — Z791 Long term (current) use of non-steroidal anti-inflammatories (NSAID): Secondary | ICD-10-CM | POA: Insufficient documentation

## 2014-01-20 DIAGNOSIS — F3289 Other specified depressive episodes: Secondary | ICD-10-CM | POA: Insufficient documentation

## 2014-01-20 DIAGNOSIS — Z8619 Personal history of other infectious and parasitic diseases: Secondary | ICD-10-CM | POA: Insufficient documentation

## 2014-01-20 DIAGNOSIS — F17200 Nicotine dependence, unspecified, uncomplicated: Secondary | ICD-10-CM | POA: Insufficient documentation

## 2014-01-20 DIAGNOSIS — F329 Major depressive disorder, single episode, unspecified: Secondary | ICD-10-CM | POA: Insufficient documentation

## 2014-01-20 DIAGNOSIS — IMO0001 Reserved for inherently not codable concepts without codable children: Secondary | ICD-10-CM | POA: Insufficient documentation

## 2014-01-20 DIAGNOSIS — Z792 Long term (current) use of antibiotics: Secondary | ICD-10-CM | POA: Insufficient documentation

## 2014-01-20 MED ORDER — HYDROCODONE-ACETAMINOPHEN 5-325 MG PO TABS: 1.0000 | ORAL_TABLET | ORAL | Status: DC | PRN

## 2014-01-20 MED ORDER — HYDROCODONE-ACETAMINOPHEN 5-325 MG PO TABS
2.0000 | ORAL_TABLET | Freq: Once | ORAL | Status: AC
Start: 2014-01-20 — End: 2014-01-20
  Administered 2014-01-20: 2 via ORAL
  Filled 2014-01-20: qty 2

## 2014-01-20 NOTE — Discharge Instructions (Signed)
Take Tylenol for mild pain or the pain medicine prescribed for bad pain. Keep your scheduled appointment with Dr.Burchette in 2 days

## 2014-01-20 NOTE — ED Notes (Signed)
Patient reports that she was seen at [facility] on Monday for CP and "kept telling them the BP cuff was too tight". Now complains that she thinks she has an injury from same. Concerned that her fingers are numb as well. No obvious trauma noted, no swelling or bruising, extremity pink and warm, positive distal pulses

## 2014-01-20 NOTE — ED Provider Notes (Signed)
CSN: [id number]     Arrival date & time 01/20/14  8469 History   First MD Initiated Contact with Patient 01/20/14 1043     Chief Complaint  Patient presents with  . Arm Pain     (Consider location/radiation/quality/duration/timing/severity/associated sxs/prior Treatment) HPI Complains of left arm pain at mid bicep area with tingling in left thumb, left index finger, and left middle finger onset 01/14/2014 after she was evaluated at [facility] emergency department for chest pain. Chest pain has since resolved. Pain in left arm is worse with movement or change of position her arm. Improved with remaining still Pain began after she fell blood pressure cuff was "way too tight". No other associated symptoms. No fever. No shortness of breath. No trauma Past Medical History  Diagnosis Date  . COLD SORE 05/15/2010  . TOBACCO ABUSE 12/12/2009  . DEPRESSION 12/12/2009  . HYPERTENSION 12/12/2009  . PREDIABETES 02/11/2010   Past Surgical History  Procedure Laterality Date  . Cholecystectomy  2006  . Tonsillectomy  1988   No family history on file. History  Substance Use Topics  . Smoking status: Current Every Day Smoker -- 1.50 packs/day for 20 years    Types: Cigarettes  . Smokeless tobacco: Not on file  . Alcohol Use: Yes   OB History   Grav Para Term Preterm Abortions TAB SAB Ect Mult Living                 Review of Systems  Musculoskeletal: Positive for myalgias.       Left arm pain  Neurological: Positive for numbness.       Tingling of fingers of left hand      Allergies  Review of patient's allergies indicates no known allergies.  Home Medications   Prior to Admission medications   Medication Sig Start Date End Date Taking? Authorizing Provider  buPROPion (WELLBUTRIN SR) 150 MG 12 hr tablet Take 1 tablet (150 mg total) by mouth 2 (two) times daily. 10/30/13   Eulas Post, MD  cephALEXin (KEFLEX) 500 MG capsule Take 1 capsule (500 mg total) by mouth 4 (four) times  daily. 01/14/14   Lenox Ahr, PA-C  glucose blood (ONE TOUCH ULTRA TEST) test strip Use as instructed daily 10/08/11   Eulas Post, MD  HYDROcodone-acetaminophen (NORCO/VICODIN) 5-325 MG per tablet Take 1 tablet by mouth every 4 (four) hours as needed for moderate pain. 01/14/14   Lenox Ahr, PA-C  ibuprofen (ADVIL,MOTRIN) 200 MG tablet Take 400 mg by mouth daily as needed for headache.    Historical Provider, MD  metFORMIN (GLUCOPHAGE) 500 MG tablet TAKE ONE TABLET BY MOUTH TWICE DAILY WITH  A  MEAL 01/15/14   Eulas Post, MD  [facility] LANCETS MISC Use as instructed daily 10/08/11   Eulas Post, MD  promethazine (PHENERGAN) 12.5 MG tablet Take 2 tablets (25 mg total) by mouth every 6 (six) hours as needed for nausea or vomiting. 01/14/14   Lenox Ahr, PA-C   BP 150/80  Pulse 81  Temp(Src) 98.4 F (36.9 C) (Oral)  Resp 16  Ht 5\' 2"  (1.575 m)  Wt 195 lb (88.451 kg)  BMI 35.66 kg/m2  SpO2 98%  LMP 12/17/2013 Physical Exam  Nursing note and vitals reviewed. Constitutional: She appears well-developed and well-nourished.  HENT:  Head: Normocephalic and atraumatic.  Eyes: Conjunctivae are normal. Pupils are equal, round, and reactive to light.  Neck: Neck supple. No tracheal deviation present. No thyromegaly present.  Cardiovascular: Normal rate and regular rhythm.   No murmur heard. Pulmonary/Chest: Effort normal and breath sounds normal.  Abdominal: Soft. Bowel sounds are normal. She exhibits no distension. There is no tenderness.  OBESE  Musculoskeletal: Normal range of motion. She exhibits no edema and no tenderness.  Left upper extremity no redness no swelling no deformity radial pulse 2+ all digits with good capillary refill full range of motion motor strength 5 over 5  Neurological: She is alert. Coordination normal.  Skin: Skin is warm and dry. No rash noted.  Psychiatric: She has a normal mood and affect.    ED Course  Procedures (including  critical care time) Labs Review Labs Reviewed - No data to display  Imaging Review No results found.   EKG Interpretation None      MDM  Patient reports she has run out of hydrocodone prescribed her on 01/14/2014. We will prescribe Norco. Final diagnoses:  None   no signs of clot, infection or vascular compromise or compartment syndrome. She's instructed to keep her scheduled appointment with [facility] 2 DAYS  Diagnosis left arm pain     Orlie Dakin, MD 01/20/14 1126

## 2014-01-22 ENCOUNTER — Ambulatory Visit (INDEPENDENT_AMBULATORY_CARE_PROVIDER_SITE_OTHER): Payer: Self-pay | Admitting: Family Medicine

## 2014-01-22 ENCOUNTER — Encounter: Payer: Self-pay | Admitting: Family Medicine

## 2014-01-22 VITALS — BP 128/82 | HR 82 | Temp 97.9°F | Wt 210.0 lb

## 2014-01-22 DIAGNOSIS — M79609 Pain in unspecified limb: Secondary | ICD-10-CM

## 2014-01-22 DIAGNOSIS — M79602 Pain in left arm: Secondary | ICD-10-CM

## 2014-01-22 DIAGNOSIS — F5102 Adjustment insomnia: Secondary | ICD-10-CM

## 2014-01-22 MED ORDER — PREDNISONE 10 MG PO TABS: ORAL_TABLET | ORAL | Status: DC

## 2014-01-22 MED ORDER — CLONAZEPAM 0.5 MG PO TABS: 0.5000 mg | ORAL_TABLET | Freq: Two times a day (BID) | ORAL | Status: DC | PRN

## 2014-01-22 NOTE — Progress Notes (Signed)
   Subjective:    Patient ID: Lisa Norton, female    DOB: October 16, 1970, 43 y.o.   MRN: [id number]  HPI Patient seen for ER followup. She's had 2 visits recently. First visit was on 5-18 for chest pain. EKG was unremarkable. Cardiac enzymes negative. Patient was diagnosed with UTI and prescription for antibiotics which she never filled. Those symptoms have resolved at this time. Week ago she developed some left upper extremity pain. No chest pain. She describes sharp constant left upper extremity pain. Went to ER on 5-24. She was prescribed hydrocodone. She does thinks she's had some diffuse weakness. No cervical neck pain. She describes tingling sensation first through third digits. No history of carpal tunnel syndrome. She is right-hand dominant. Pain radiates from her hand all went to the shoulder. No exacerbating features. No alleviating features.  She has type 2 diabetes and has not been taking her metformin in the past week. Not monitoring blood sugars recently. Boyfriend of 16 years recently left and she's had tremendous difficulties coping with that. Poor sleep. Depressed mood.  No suicidal ideation. Past Medical History  Diagnosis Date  . COLD SORE 05/15/2010  . TOBACCO ABUSE 12/12/2009  . DEPRESSION 12/12/2009  . HYPERTENSION 12/12/2009  . PREDIABETES 02/11/2010   Past Surgical History  Procedure Laterality Date  . Cholecystectomy  2006  . Tonsillectomy  1988    reports that she has been smoking Cigarettes.  She has a 30 pack-year smoking history. She does not have any smokeless tobacco history on file. She reports that she drinks alcohol. She reports that she does not use illicit drugs. family history is not on file. No Known Allergies    Review of Systems  Constitutional: Negative for fever and chills.  Respiratory: Negative for cough and shortness of breath.   Cardiovascular: Negative for chest pain.  Neurological: Positive for weakness and numbness. Negative for dizziness.    Psychiatric/Behavioral: Positive for dysphoric mood.       Objective:   Physical Exam  Constitutional: She is oriented to person, place, and time. She appears well-developed and well-nourished.  Neck: Neck supple. No thyromegaly present.  Cardiovascular: Normal rate and regular rhythm.   Pulmonary/Chest: Effort normal and breath sounds normal. No respiratory distress. She has no wheezes. She has no rales.  Musculoskeletal: She exhibits no edema.  Good distal pulses.  Neurological: She is alert and oriented to person, place, and time. She has normal reflexes. No cranial nerve deficit.  Full-strength upper extremity on the right.  left reveals diffuse weakness versus decreased effort. No muscle atrophy. Deep tendon reflexes are 2+ left upper extremity and right upper extremity. Normal sensory function to touch          Assessment & Plan:  #1 left upper extremity pain. Suspect cervical radiculopathy but she's not having neck pain. Doubt carpal tunnel syndrome. No evidence for vascular etiology. Cautious trial of prednisone and watch blood sugars closely. Continue hydrocodone as needed for pain relief. May need MRI to further assess. #2 insomnia. Transient. We have suggested counseling but at this point she has no insurance coverage is not willing to go.. Short-term use only clonazepam 0.5 mg each bedtime. Sleep hygiene discussed.

## 2014-01-22 NOTE — Patient Instructions (Signed)
Follow up for any progressive pain or weakness.

## 2014-01-22 NOTE — Progress Notes (Signed)
Pre visit review using our clinic review tool, if applicable. No additional management support is needed unless otherwise documented below in the visit note. 

## 2014-01-28 ENCOUNTER — Telehealth: Payer: Self-pay | Admitting: Family Medicine

## 2014-01-28 NOTE — Telephone Encounter (Signed)
Pt is aware that Dr. Elease Hashimoto is out of the office today 01/28/14

## 2014-01-28 NOTE — Telephone Encounter (Signed)
May do limited Vicodin 5/325 mg 1-2 po q 6 hours prn severe pain #30 with no refill.  This is a short term use only and if pain persists will have to look at possible MRI.

## 2014-01-28 NOTE — Telephone Encounter (Signed)
Patient Information:  Caller Name: [place]  Phone: [phone]  Patient: Lisa Norton, Lisa Norton  Gender: Female  DOB: [birth year]  Age: 43 Years  PCP: Carolann Littler [facility])  Pregnant: No  Office Follow Up:  Does the office need to follow up with this patient?: Yes  Instructions For The Office: Requesting pain medication  RN Note:  Left arm pain chronic.  Diagnosed with pinched nerve and currently taking steroids.  No relief.  Requesting consideration of medication to treat pain.  Symptoms  Reason For Call & Symptoms: follow up left arm pain  Reviewed Health History In EMR: Yes  Reviewed Medications In EMR: Yes  Reviewed Allergies In EMR: Yes  Reviewed Surgeries / Procedures: Yes  Date of Onset of Symptoms: 01/14/2014 OB / GYN:  LMP: 02/20/2014  Guideline(s) Used:  Arm Pain  Disposition Per Guideline:   See Within 3 Days in Office  Reason For Disposition Reached:   Moderate pain (e.g. interferes with normal activities) and present > 3 days  Advice Given:  Call Back If:  You become worse.  Patient Refused Recommendation:  Patient Requests Prescription  Requesting pain medication

## 2014-01-29 ENCOUNTER — Telehealth: Payer: Self-pay | Admitting: Family Medicine

## 2014-01-29 DIAGNOSIS — M79602 Pain in left arm: Secondary | ICD-10-CM

## 2014-01-29 MED ORDER — HYDROCODONE-ACETAMINOPHEN 5-325 MG PO TABS: 1.0000 | ORAL_TABLET | Freq: Four times a day (QID) | ORAL | Status: DC | PRN

## 2014-01-29 NOTE — Telephone Encounter (Signed)
Pt is aware that Rx is ready for pickup  

## 2014-01-30 NOTE — Telephone Encounter (Signed)
Pt would to proceed with mri on arm. Pt is still having arm pain and tingling in fingers. Pt requesting morning MRI. Pt was seen on 01-22-14

## 2014-01-30 NOTE — Telephone Encounter (Signed)
Pt following up on request for mri. Pt states pain is consistent. Pt would like to proceed asap.

## 2014-02-01 NOTE — Telephone Encounter (Signed)
Pt informed

## 2014-02-01 NOTE — Telephone Encounter (Signed)
I will set up MRI cervical spine.  Let pt know.

## 2014-02-08 ENCOUNTER — Other Ambulatory Visit: Payer: Self-pay | Admitting: Family Medicine

## 2014-02-08 ENCOUNTER — Telehealth: Payer: Self-pay

## 2014-02-08 ENCOUNTER — Ambulatory Visit
Admission: RE | Admit: 2014-02-08 | Discharge: 2014-02-08 | Disposition: A | Discharge status: Home / Self Care | Payer: No Typology Code available for payment source | Source: Ambulatory Visit | Attending: Family Medicine | Admitting: Family Medicine

## 2014-02-08 DIAGNOSIS — M79602 Pain in left arm: Secondary | ICD-10-CM

## 2014-02-08 NOTE — Telephone Encounter (Signed)
Pt is wanting to know is there anything that can help the numbness in her hand.

## 2014-02-10 NOTE — Telephone Encounter (Signed)
No..  She needs to see neurosurgeon.

## 2014-02-11 ENCOUNTER — Encounter: Payer: Self-pay | Admitting: Internal Medicine

## 2014-02-11 ENCOUNTER — Telehealth: Payer: Self-pay | Admitting: Family Medicine

## 2014-02-11 ENCOUNTER — Ambulatory Visit (INDEPENDENT_AMBULATORY_CARE_PROVIDER_SITE_OTHER): Payer: Self-pay | Admitting: Internal Medicine

## 2014-02-11 VITALS — BP 134/80 | Temp 98.2°F | Ht 62.0 in | Wt 212.0 lb

## 2014-02-11 DIAGNOSIS — I10 Essential (primary) hypertension: Secondary | ICD-10-CM

## 2014-02-11 DIAGNOSIS — M5412 Radiculopathy, cervical region: Secondary | ICD-10-CM

## 2014-02-11 DIAGNOSIS — R609 Edema, unspecified: Secondary | ICD-10-CM

## 2014-02-11 DIAGNOSIS — R600 Localized edema: Secondary | ICD-10-CM

## 2014-02-11 DIAGNOSIS — E119 Type 2 diabetes mellitus without complications: Secondary | ICD-10-CM

## 2014-02-11 NOTE — Patient Instructions (Signed)
Limit your sodium (Salt) intake  Keep her legs elevated as much as possible  Call or return to clinic prn if these symptoms worsen or fail to improve as anticipated.

## 2014-02-11 NOTE — Telephone Encounter (Signed)
Pt would like a referral to see another neurosurgery. [facility] is requesting fee of 200.00 upfront and pt does not have ins and they will not make payment arrangement for 200.00. Pt would like to go to [place]

## 2014-02-11 NOTE — Telephone Encounter (Signed)
Pt will have to have a $150.00 up front fee and addition fee after the visit informed pt of this she states that she can not do this doesn't have the money and she decline appt  With  [facility] [address] # [place], [place] [phone] Also called other Neurosurgery office they require a fee as well

## 2014-02-11 NOTE — Progress Notes (Signed)
Subjective:    Patient ID: Lisa Norton, female    DOB: Jan 13, 1971, 43 y.o.   MRN: [id number]  HPI  43 year old patient who is seen today with a chief complaint of lower extremity swelling.  This has affected the her feet and ankles, but over the past 24 hours much improved on the right.  She was seen approximately 2 weeks ago for a left C7 radiculopathy, which was confirmed with cervical MRI.  She was treated with a prednisone taper.  Denies any cardiopulmonary complaints She continues to have paresthesias along her left thumb, second and third fingers  Past Medical History  Diagnosis Date  . COLD SORE 05/15/2010  . TOBACCO ABUSE 12/12/2009  . DEPRESSION 12/12/2009  . HYPERTENSION 12/12/2009  . PREDIABETES 02/11/2010    History   Social History  . Marital Status: Married    Spouse Name: N/A    Number of Children: N/A  . Years of Education: N/A   Occupational History  . Not on file.   Social History Main Topics  . Smoking status: Current Every Day Smoker -- 1.50 packs/day for 20 years    Types: Cigarettes  . Smokeless tobacco: Not on file  . Alcohol Use: Yes  . Drug Use: No  . Sexual Activity: Not on file   Other Topics Concern  . Not on file   Social History Narrative  . No narrative on file    Past Surgical History  Procedure Laterality Date  . Cholecystectomy  2006  . Tonsillectomy  1988    No family history on file.  No Known Allergies  Current Outpatient Prescriptions on File Prior to Visit  Medication Sig Dispense Refill  . clonazePAM (KLONOPIN) 0.5 MG tablet Take 1 tablet (0.5 mg total) by mouth 2 (two) times daily as needed for anxiety.  20 tablet  1  . glucose blood (ONE TOUCH ULTRA TEST) test strip Use as instructed daily  100 each  3  . metFORMIN (GLUCOPHAGE) 500 MG tablet TAKE ONE TABLET BY MOUTH TWICE DAILY WITH  A  MEAL  60 tablet  5  . ONETOUCH DELICA LANCETS MISC Use as instructed daily  100 each  3  . buPROPion (WELLBUTRIN SR) 150 MG 12 hr  tablet Take 1 tablet (150 mg total) by mouth 2 (two) times daily.  60 tablet  2  . HYDROcodone-acetaminophen (NORCO/VICODIN) 5-325 MG per tablet Take 1-2 tablets by mouth every 6 (six) hours as needed for severe pain.  30 tablet  0  . ibuprofen (ADVIL,MOTRIN) 200 MG tablet Take 400 mg by mouth daily as needed for headache.       No current facility-administered medications on file prior to visit.    BP 134/80  Temp(Src) 98.2 F (36.8 C) (Oral)  Ht 5\' 2"  (1.575 m)  Wt 212 lb (96.163 kg)  BMI 38.77 kg/m2  LMP 12/17/2013       Review of Systems  Cardiovascular: Positive for leg swelling.  Neurological: Positive for numbness.       Objective:   Physical Exam  Constitutional: She appears well-developed and well-nourished. No distress.  Neck: Normal range of motion. No JVD present.  Cardiovascular: Normal rate, regular rhythm and normal heart sounds.   Pulmonary/Chest: Effort normal and breath sounds normal. No respiratory distress. She has no wheezes. She has no rales.  Musculoskeletal: She exhibits edema.  Plus 1 left pedal and ankle edema Trace edema right foot          Assessment &  Plan:   Mild pedal edema.  Secondary to recent steroid use.  Low salt diet elevation discussed.  Will report any clinical worsening Left C7 radiculopathy.  Paresthesias, unchanged.  Neurosurgical referral in progress.

## 2014-02-11 NOTE — Telephone Encounter (Signed)
Pt informed

## 2014-02-11 NOTE — Progress Notes (Signed)
Pre visit review using our clinic review tool, if applicable. No additional management support is needed unless otherwise documented below in the visit note. 

## 2014-02-13 NOTE — Telephone Encounter (Signed)
Pt stated dr Elease Hashimoto mention for her to go to [facility] and they have a program that helps patients with out ins. Pt can not remember info

## 2014-02-13 NOTE — Telephone Encounter (Signed)
I do not know that [facility] has program for indigent patients.  She will need to check.

## 2014-02-14 NOTE — Telephone Encounter (Signed)
Pt called back and I informed her of the information that dr. Elease Hashimoto provided. Pt verbalized understanding.

## 2014-02-21 ENCOUNTER — Telehealth: Payer: Self-pay

## 2014-02-21 NOTE — Telephone Encounter (Signed)
Pt walked into clinic today and states she has a cut on her left ankle.  This happened on Tuesday while pt was moving items into her new place.  Pt has a concern about the area because she is a diabetic.  The cut on her ankle is red, swollen and tender to touch.  Pt has been keeping the area clean and using neosporin on it.  Pt was seen last week by Dr. Elease Hashimoto for swelling of her ankles. Advised pt that due to the redness of her ankle she should be seen.  Offered pt an appointment today, Friday (6/26) and Saturday (6/27) but pt states she cannot be seen due to her work schedule. Advised pt that she needed to watch the area for increased redness, swelling, and fever.  Advised if any of these symptoms arise, pt needed to be seen immediately.  Pt verabalized understanding.  Pt states she will make an appointment with Dr. Elease Hashimoto on 02/25/14.

## 2014-02-25 ENCOUNTER — Ambulatory Visit: Payer: Self-pay | Admitting: Family Medicine

## 2014-03-04 ENCOUNTER — Telehealth: Payer: Self-pay | Admitting: Family Medicine

## 2014-03-04 NOTE — Telephone Encounter (Addendum)
Pt would like something stronger than hydrocodone for numbness/tingling in hand/arms . Pt is ready to proceed with going to [facility]. The referral has been sent to deb. Please advise

## 2014-03-04 NOTE — Telephone Encounter (Addendum)
Pt would like to go ahead and get an appt with [facility] [phone]. Pt will bring 150.00 upfront cost. Deb please sch dx numbness/tingling  In arms/ hands

## 2014-03-04 NOTE — Telephone Encounter (Signed)
I refaxed  Referral  And ov notes to  [facility]-  I informed their office that the patient  will have to have a $150.00 up front fee as well .Their office will review and contact pt to schedule / and I  inform the patient of this as well . Once their office review they will inform the patient of her schedule appointment and our office regarding pt appointment  Sent to  [facility] [address] # [place], [place] [phone]

## 2014-03-05 NOTE — Telephone Encounter (Signed)
Pt informed

## 2014-03-05 NOTE — Telephone Encounter (Signed)
NO pain med will help with the numbness and tingling. Proceed with neurosurgical referral.  Would only use pain med for severe pain.

## 2014-03-22 ENCOUNTER — Other Ambulatory Visit: Payer: Self-pay | Admitting: Family Medicine

## 2014-03-22 ENCOUNTER — Telehealth: Payer: Self-pay | Admitting: Family Medicine

## 2014-03-22 NOTE — Telephone Encounter (Signed)
Last visit 02/11/14 Last refill 01/22/14 #20 1 refill

## 2014-03-22 NOTE — Telephone Encounter (Signed)
Last visit 02/11/14 Last refill 01/22/14  #20 1 refill

## 2014-03-22 NOTE — Telephone Encounter (Signed)
RX called to pharmacy.

## 2014-03-22 NOTE — Telephone Encounter (Signed)
Pt is out of clonazepam 0.5 mg # ? 20 sent to [facility]

## 2014-03-22 NOTE — Telephone Encounter (Signed)
Refill once.  Avoid regular use. 

## 2014-03-22 NOTE — Telephone Encounter (Signed)
Refill once 

## 2014-04-09 ENCOUNTER — Telehealth: Payer: Self-pay | Admitting: Family Medicine

## 2014-04-09 NOTE — Telephone Encounter (Signed)
[id number] - [place], [place] - [phone] is requesting re-fill on clonazePAM (KLONOPIN) 0.5 MG tablet

## 2014-04-09 NOTE — Telephone Encounter (Signed)
Last visit 02/11/14 Last refill 03/22/14 #20 0 refill

## 2014-04-10 MED ORDER — CLONAZEPAM 0.5 MG PO TABS: ORAL_TABLET | ORAL | Status: DC

## 2014-04-10 NOTE — Telephone Encounter (Signed)
Faxed Rx to pharmacy  

## 2014-04-10 NOTE — Telephone Encounter (Signed)
Recommend against regular use.  Try to use only at night PRN for severe insomnia.  Refill once.

## 2014-04-22 ENCOUNTER — Telehealth: Payer: Self-pay | Admitting: Family Medicine

## 2014-04-22 ENCOUNTER — Other Ambulatory Visit: Payer: Self-pay | Admitting: Family Medicine

## 2014-04-22 DIAGNOSIS — E119 Type 2 diabetes mellitus without complications: Secondary | ICD-10-CM

## 2014-04-22 NOTE — Telephone Encounter (Signed)
yes

## 2014-04-22 NOTE — Telephone Encounter (Signed)
Lab is ordered.  

## 2014-04-22 NOTE — Telephone Encounter (Signed)
Pt stated its time for her A1C  Can I sch?

## 2014-04-22 NOTE — Telephone Encounter (Signed)
Is it okay to order? 

## 2014-04-23 NOTE — Telephone Encounter (Signed)
Pt has been sch

## 2014-04-25 ENCOUNTER — Other Ambulatory Visit (INDEPENDENT_AMBULATORY_CARE_PROVIDER_SITE_OTHER): Payer: Self-pay

## 2014-04-25 DIAGNOSIS — E119 Type 2 diabetes mellitus without complications: Secondary | ICD-10-CM

## 2014-04-25 DIAGNOSIS — E785 Hyperlipidemia, unspecified: Secondary | ICD-10-CM

## 2014-04-25 DIAGNOSIS — R7989 Other specified abnormal findings of blood chemistry: Secondary | ICD-10-CM

## 2014-04-25 LAB — LIPID PANEL
Cholesterol: 220 mg/dL — ABNORMAL HIGH (ref 0–200)
HDL: 20.9 mg/dL — ABNORMAL LOW
NonHDL: 199.1
Total CHOL/HDL Ratio: 11
Triglycerides: 220 mg/dL — ABNORMAL HIGH (ref 0.0–149.0)
VLDL: 44 mg/dL — ABNORMAL HIGH (ref 0.0–40.0)

## 2014-04-25 LAB — HEPATIC FUNCTION PANEL
ALT: 12 U/L (ref 0–35)
AST: 26 U/L (ref 0–37)
Albumin: 3.2 g/dL — ABNORMAL LOW (ref 3.5–5.2)
Alkaline Phosphatase: 83 U/L (ref 39–117)
Bilirubin, Direct: 0.1 mg/dL (ref 0.0–0.3)
Total Bilirubin: 0.7 mg/dL (ref 0.2–1.2)
Total Protein: 6.5 g/dL (ref 6.0–8.3)

## 2014-04-25 LAB — LDL CHOLESTEROL, DIRECT: Direct LDL: 184.2 mg/dL

## 2014-04-25 LAB — HEMOGLOBIN A1C: Hgb A1c MFr Bld: 6.5 % (ref 4.6–6.5)

## 2014-05-09 ENCOUNTER — Telehealth: Payer: Self-pay | Admitting: Family Medicine

## 2014-05-09 MED ORDER — CLONAZEPAM 0.5 MG PO TABS: ORAL_TABLET | ORAL | Status: DC

## 2014-05-09 NOTE — Telephone Encounter (Signed)
Refill once 

## 2014-05-09 NOTE — Telephone Encounter (Signed)
Pt has new doctor that will except her medicaid and her appt is 05/24/14 . She would like to know if Dr Elease Hashimoto will refill her rx until she see the new doctor.    clonazePAM (KLONOPIN) 0.5 MG tablet    Pharmacy [facility]

## 2014-05-09 NOTE — Telephone Encounter (Signed)
Called in RX 

## 2014-05-09 NOTE — Telephone Encounter (Signed)
Last visit 02/11/14 Last refill 04/10/14 #20 0 refill

## 2014-07-30 LAB — POCT URINE PREGNANCY: Preg Test, Ur: POSITIVE

## 2014-08-08 ENCOUNTER — Other Ambulatory Visit: Payer: Self-pay | Admitting: Obstetrics and Gynecology

## 2014-08-08 DIAGNOSIS — O09529 Supervision of elderly multigravida, unspecified trimester: Secondary | ICD-10-CM

## 2014-08-09 ENCOUNTER — Ambulatory Visit (INDEPENDENT_AMBULATORY_CARE_PROVIDER_SITE_OTHER): Payer: Medicaid Other

## 2014-08-09 ENCOUNTER — Other Ambulatory Visit: Payer: Self-pay | Admitting: Obstetrics and Gynecology

## 2014-08-09 DIAGNOSIS — O24911 Unspecified diabetes mellitus in pregnancy, first trimester: Secondary | ICD-10-CM

## 2014-08-09 DIAGNOSIS — O09529 Supervision of elderly multigravida, unspecified trimester: Secondary | ICD-10-CM

## 2014-08-09 NOTE — Progress Notes (Signed)
U/S-single IUP with +FCA noted, FHR-161 bpm, CRL c/w 11+4wks EDD 02/24/2015, cx appears closed, bilateral adnexa appears WNL pt desires NT/IT will be scheduled with New OB appt

## 2014-08-12 ENCOUNTER — Encounter: Payer: Self-pay | Admitting: Women's Health

## 2014-08-12 ENCOUNTER — Ambulatory Visit (INDEPENDENT_AMBULATORY_CARE_PROVIDER_SITE_OTHER): Payer: Medicaid Other | Admitting: Women's Health

## 2014-08-12 VITALS — BP 112/60 | Wt 207.0 lb

## 2014-08-12 DIAGNOSIS — Z0283 Encounter for blood-alcohol and blood-drug test: Secondary | ICD-10-CM

## 2014-08-12 DIAGNOSIS — O24319 Unspecified pre-existing diabetes mellitus in pregnancy, unspecified trimester: Secondary | ICD-10-CM

## 2014-08-12 DIAGNOSIS — O09891 Supervision of other high risk pregnancies, first trimester: Secondary | ICD-10-CM

## 2014-08-12 DIAGNOSIS — Z3401 Encounter for supervision of normal first pregnancy, first trimester: Secondary | ICD-10-CM

## 2014-08-12 DIAGNOSIS — Z331 Pregnant state, incidental: Secondary | ICD-10-CM

## 2014-08-12 DIAGNOSIS — Z1159 Encounter for screening for other viral diseases: Secondary | ICD-10-CM

## 2014-08-12 DIAGNOSIS — O09899 Supervision of other high risk pregnancies, unspecified trimester: Secondary | ICD-10-CM | POA: Insufficient documentation

## 2014-08-12 DIAGNOSIS — Z8679 Personal history of other diseases of the circulatory system: Secondary | ICD-10-CM

## 2014-08-12 DIAGNOSIS — Z1389 Encounter for screening for other disorder: Secondary | ICD-10-CM

## 2014-08-12 DIAGNOSIS — Z114 Encounter for screening for human immunodeficiency virus [HIV]: Secondary | ICD-10-CM

## 2014-08-12 DIAGNOSIS — Z720 Tobacco use: Secondary | ICD-10-CM

## 2014-08-12 DIAGNOSIS — Z0184 Encounter for antibody response examination: Secondary | ICD-10-CM

## 2014-08-12 DIAGNOSIS — Z118 Encounter for screening for other infectious and parasitic diseases: Secondary | ICD-10-CM

## 2014-08-12 DIAGNOSIS — F17200 Nicotine dependence, unspecified, uncomplicated: Secondary | ICD-10-CM

## 2014-08-12 DIAGNOSIS — O09521 Supervision of elderly multigravida, first trimester: Secondary | ICD-10-CM

## 2014-08-12 DIAGNOSIS — Z113 Encounter for screening for infections with a predominantly sexual mode of transmission: Secondary | ICD-10-CM

## 2014-08-12 DIAGNOSIS — E669 Obesity, unspecified: Secondary | ICD-10-CM

## 2014-08-12 DIAGNOSIS — Z3682 Encounter for antenatal screening for nuchal translucency: Secondary | ICD-10-CM

## 2014-08-12 DIAGNOSIS — O09529 Supervision of elderly multigravida, unspecified trimester: Secondary | ICD-10-CM | POA: Insufficient documentation

## 2014-08-12 MED ORDER — CONCEPT DHA 53.5-38-1 MG PO CAPS: 1.0000 | ORAL_CAPSULE | Freq: Every day | ORAL | Status: DC

## 2014-08-12 NOTE — Patient Instructions (Addendum)
Nausea & Vomiting  Have saltine crackers or pretzels by your bed and eat a few bites before you raise your head out of bed in the morning  Eat small frequent meals throughout the day instead of large meals  Drink plenty of fluids throughout the day to stay hydrated, just don't drink a lot of fluids with your meals.  This can make your stomach fill up faster making you feel sick  Do not brush your teeth right after you eat  Products with real ginger are good for nausea, like ginger ale and ginger hard candy Make sure it says made with real ginger!  Sucking on sour candy like lemon heads is also good for nausea  If your prenatal vitamins make you nauseated, take them at night so you will sleep through the nausea  Sea Bands  If you feel like you need medicine for the nausea & vomiting please let us know  If you are unable to keep any fluids or food down please let us know  Check blood sugars 4 times a day: in the morning before eating/drinking anything (<90) and 2 hours after eating breakfast, lunch, and supper (<120).    Tips to help lower cholesterol:  . Weight loss, exercise, no concentrated sugars (sweets, candy, cake) . Mediterranean style diet:  o High in fruits, vegs, whole grains, beans, nuts, seeds, olive oil o Low-Mod fish, poultry, dairy o Little red meat   First Trimester of Pregnancy The first trimester of pregnancy is from week 1 until the end of week 12 (months 1 through 3). A week after a sperm fertilizes an egg, the egg will implant on the wall of the uterus. This embryo will begin to develop into a baby. Genes from you and your partner are forming the baby. The female genes determine whether the baby is a boy or a girl. At 6-8 weeks, the eyes and face are formed, and the heartbeat can be seen on ultrasound. At the end of 12 weeks, all the baby's organs are formed.  Now that you are pregnant, you will want to do everything you can to have a healthy baby. Two of the most  important things are to get good prenatal care and to follow your health care provider's instructions. Prenatal care is all the medical care you receive before the baby's birth. This care will help prevent, find, and treat any problems during the pregnancy and childbirth. BODY CHANGES Your body goes through many changes during pregnancy. The changes vary from woman to woman.   You may gain or lose a couple of pounds at first.  You may feel sick to your stomach (nauseous) and throw up (vomit). If the vomiting is uncontrollable, call your health care provider.  You may tire easily.  You may develop headaches that can be relieved by medicines approved by your health care provider.  You may urinate more often. Painful urination may mean you have a bladder infection.  You may develop heartburn as a result of your pregnancy.  You may develop constipation because certain hormones are causing the muscles that push waste through your intestines to slow down.  You may develop hemorrhoids or swollen, bulging veins (varicose veins).  Your breasts may begin to grow larger and become tender. Your nipples may stick out more, and the tissue that surrounds them (areola) may become darker.  Your gums may bleed and may be sensitive to brushing and flossing.  Dark spots or blotches (chloasma, mask of pregnancy)  may develop on your face. This will likely fade after the baby is born.  Your menstrual periods will stop.  You may have a loss of appetite.  You may develop cravings for certain kinds of food.  You may have changes in your emotions from day to day, such as being excited to be pregnant or being concerned that something may go wrong with the pregnancy and baby.  You may have more vivid and strange dreams.  You may have changes in your hair. These can include thickening of your hair, rapid growth, and changes in texture. Some women also have hair loss during or after pregnancy, or hair that  feels dry or thin. Your hair will most likely return to normal after your baby is born. WHAT TO EXPECT AT YOUR PRENATAL VISITS During a routine prenatal visit:  You will be weighed to make sure you and the baby are growing normally.  Your blood pressure will be taken.  Your abdomen will be measured to track your baby's growth.  The fetal heartbeat will be listened to starting around week 10 or 12 of your pregnancy.  Test results from any previous visits will be discussed. Your health care provider may ask you:  How you are feeling.  If you are feeling the baby move.  If you have had any abnormal symptoms, such as leaking fluid, bleeding, severe headaches, or abdominal cramping.  If you have any questions. Other tests that may be performed during your first trimester include:  Blood tests to find your blood type and to check for the presence of any previous infections. They will also be used to check for low iron levels (anemia) and Rh antibodies. Later in the pregnancy, blood tests for diabetes will be done along with other tests if problems develop.  Urine tests to check for infections, diabetes, or protein in the urine.  An ultrasound to confirm the proper growth and development of the baby.  An amniocentesis to check for possible genetic problems.  Fetal screens for spina bifida and Down syndrome.  You may need other tests to make sure you and the baby are doing well. HOME CARE INSTRUCTIONS  Medicines  Follow your health care provider's instructions regarding medicine use. Specific medicines may be either safe or unsafe to take during pregnancy.  Take your prenatal vitamins as directed.  If you develop constipation, try taking a stool softener if your health care provider approves. Diet  Eat regular, well-balanced meals. Choose a variety of foods, such as meat or vegetable-based protein, fish, milk and low-fat dairy products, vegetables, fruits, and whole grain breads  and cereals. Your health care provider will help you determine the amount of weight gain that is right for you.  Avoid raw meat and uncooked cheese. These carry germs that can cause birth defects in the baby.  Eating four or five small meals rather than three large meals a day may help relieve nausea and vomiting. If you start to feel nauseous, eating a few soda crackers can be helpful. Drinking liquids between meals instead of during meals also seems to help nausea and vomiting.  If you develop constipation, eat more high-fiber foods, such as fresh vegetables or fruit and whole grains. Drink enough fluids to keep your urine clear or pale yellow. Activity and Exercise  Exercise only as directed by your health care provider. Exercising will help you:  Control your weight.  Stay in shape.  Be prepared for labor and delivery.  Experiencing pain  or cramping in the lower abdomen or low back is a good sign that you should stop exercising. Check with your health care provider before continuing normal exercises.  Try to avoid standing for long periods of time. Move your legs often if you must stand in one place for a long time.  Avoid heavy lifting.  Wear low-heeled shoes, and practice good posture.  You may continue to have sex unless your health care provider directs you otherwise. Relief of Pain or Discomfort  Wear a good support bra for breast tenderness.   Take warm sitz baths to soothe any pain or discomfort caused by hemorrhoids. Use hemorrhoid cream if your health care provider approves.   Rest with your legs elevated if you have leg cramps or low back pain.  If you develop varicose veins in your legs, wear support hose. Elevate your feet for 15 minutes, 3-4 times a day. Limit salt in your diet. Prenatal Care  Schedule your prenatal visits by the twelfth week of pregnancy. They are usually scheduled monthly at first, then more often in the last 2 months before  delivery.  Write down your questions. Take them to your prenatal visits.  Keep all your prenatal visits as directed by your health care provider. Safety  Wear your seat belt at all times when driving.  Make a list of emergency phone numbers, including numbers for family, friends, the hospital, and police and fire departments. General Tips  Ask your health care provider for a referral to a local prenatal education class. Begin classes no later than at the beginning of month 6 of your pregnancy.  Ask for help if you have counseling or nutritional needs during pregnancy. Your health care provider can offer advice or refer you to specialists for help with various needs.  Do not use hot tubs, steam rooms, or saunas.  Do not douche or use tampons or scented sanitary pads.  Do not cross your legs for long periods of time.  Avoid cat litter boxes and soil used by cats. These carry germs that can cause birth defects in the baby and possibly loss of the fetus by miscarriage or stillbirth.  Avoid all smoking, herbs, alcohol, and medicines not prescribed by your health care provider. Chemicals in these affect the formation and growth of the baby.  Schedule a dentist appointment. At home, brush your teeth with a soft toothbrush and be gentle when you floss. SEEK MEDICAL CARE IF:   You have dizziness.  You have mild pelvic cramps, pelvic pressure, or nagging pain in the abdominal area.  You have persistent nausea, vomiting, or diarrhea.  You have a bad smelling vaginal discharge.  You have pain with urination.  You notice increased swelling in your face, hands, legs, or ankles. SEEK IMMEDIATE MEDICAL CARE IF:   You have a fever.  You are leaking fluid from your vagina.  You have spotting or bleeding from your vagina.  You have severe abdominal cramping or pain.  You have rapid weight gain or loss.  You vomit blood or material that looks like coffee grounds.  You are exposed to  [place] measles and have never had them.  You are exposed to fifth disease or chickenpox.  You develop a severe headache.  You have shortness of breath.  You have any kind of trauma, such as from a fall or a car accident. Document Released: 08/10/2001 Document Revised: 12/31/2013 Document Reviewed: 06/26/2013 [facility] Patient Information 2015 [place], [place]. This information is not intended  to replace advice given to you by your health care provider. Make sure you discuss any questions you have with your health care provider.  
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
## 2014-08-12 NOTE — Addendum Note (Signed)
Addended by: Roma Schanz on: 08/12/2014 03:29 PM   Modules accepted: Orders

## 2014-08-13 DIAGNOSIS — E669 Obesity, unspecified: Secondary | ICD-10-CM

## 2014-08-13 LAB — POCT URINALYSIS DIPSTICK
Glucose, UA: NEGATIVE
Protein, UA: NEGATIVE

## 2014-08-13 LAB — URINALYSIS, ROUTINE W REFLEX MICROSCOPIC
Bilirubin Urine: NEGATIVE
Glucose, UA: NEGATIVE mg/dL
Hgb urine dipstick: NEGATIVE
KETONES UR: NEGATIVE mg/dL
Nitrite: NEGATIVE
PH: 6 (ref 5.0–8.0)
Protein, ur: NEGATIVE mg/dL
SPECIFIC GRAVITY, URINE: 1.012 (ref 1.005–1.030)
UROBILINOGEN UA: 0.2 mg/dL (ref 0.0–1.0)

## 2014-08-13 LAB — URINALYSIS, MICROSCOPIC ONLY
CRYSTALS: NONE SEEN
Casts: NONE SEEN

## 2014-08-13 LAB — CBC
HCT: 39.9 % (ref 36.0–46.0)
Hemoglobin: 13.9 g/dL (ref 12.0–15.0)
MCH: 32.2 pg (ref 26.0–34.0)
MCHC: 34.8 g/dL (ref 30.0–36.0)
MCV: 92.4 fL (ref 78.0–100.0)
MPV: 11.4 fL (ref 9.4–12.4)
PLATELETS: 215 10*3/uL (ref 150–400)
RBC: 4.32 MIL/uL (ref 3.87–5.11)
RDW: 13.6 % (ref 11.5–15.5)
WBC: 8.7 10*3/uL (ref 4.0–10.5)

## 2014-08-13 LAB — ABO AND RH: Rh Type: POSITIVE

## 2014-08-13 LAB — GC/CHLAMYDIA PROBE AMP
CT PROBE, AMP APTIMA: NEGATIVE
GC Probe RNA: NEGATIVE

## 2014-08-13 LAB — RUBELLA SCREEN: Rubella: 2.53 Index — ABNORMAL HIGH (ref <0.90)

## 2014-08-13 LAB — HEMOGLOBIN A1C
HEMOGLOBIN A1C: 5.7 % — AB (ref <5.7)
Mean Plasma Glucose: 117 mg/dL — ABNORMAL HIGH (ref <117)

## 2014-08-13 LAB — OXYCODONE SCREEN, UA, RFLX CONFIRM: Oxycodone Screen, Ur: NEGATIVE ng/mL

## 2014-08-13 LAB — DRUG SCREEN, URINE, NO CONFIRMATION
AMPHETAMINE SCRN UR: NEGATIVE
BARBITURATE QUANT UR: NEGATIVE
Benzodiazepines.: NEGATIVE
COCAINE METABOLITES: NEGATIVE
Creatinine,U: 113.3 mg/dL
Marijuana Metabolite: NEGATIVE
Methadone: NEGATIVE
OPIATE SCREEN, URINE: NEGATIVE
PHENCYCLIDINE (PCP): NEGATIVE
Propoxyphene: NEGATIVE

## 2014-08-13 LAB — ANTIBODY SCREEN: Antibody Screen: NEGATIVE

## 2014-08-13 LAB — HEPATITIS B SURFACE ANTIGEN: Hepatitis B Surface Ag: NEGATIVE

## 2014-08-13 LAB — VARICELLA ZOSTER ANTIBODY, IGG: Varicella IgG: 3650 Index — ABNORMAL HIGH (ref <135.00)

## 2014-08-13 LAB — RPR

## 2014-08-13 LAB — HIV ANTIBODY (ROUTINE TESTING W REFLEX): HIV: NONREACTIVE

## 2014-08-14 LAB — URINE CULTURE
COLONY COUNT: NO GROWTH
ORGANISM ID, BACTERIA: NO GROWTH

## 2014-08-20 ENCOUNTER — Ambulatory Visit (INDEPENDENT_AMBULATORY_CARE_PROVIDER_SITE_OTHER): Payer: Medicaid Other

## 2014-08-20 ENCOUNTER — Ambulatory Visit (INDEPENDENT_AMBULATORY_CARE_PROVIDER_SITE_OTHER): Payer: Medicaid Other | Admitting: Obstetrics & Gynecology

## 2014-08-20 ENCOUNTER — Other Ambulatory Visit: Payer: Self-pay | Admitting: Women's Health

## 2014-08-20 ENCOUNTER — Encounter: Payer: Self-pay | Admitting: Obstetrics & Gynecology

## 2014-08-20 VITALS — BP 100/60 | Wt 211.0 lb

## 2014-08-20 DIAGNOSIS — Z3682 Encounter for antenatal screening for nuchal translucency: Secondary | ICD-10-CM

## 2014-08-20 DIAGNOSIS — O09522 Supervision of elderly multigravida, second trimester: Secondary | ICD-10-CM

## 2014-08-20 DIAGNOSIS — O24319 Unspecified pre-existing diabetes mellitus in pregnancy, unspecified trimester: Secondary | ICD-10-CM

## 2014-08-20 DIAGNOSIS — O09521 Supervision of elderly multigravida, first trimester: Secondary | ICD-10-CM

## 2014-08-20 DIAGNOSIS — Z1389 Encounter for screening for other disorder: Secondary | ICD-10-CM

## 2014-08-20 DIAGNOSIS — Z36 Encounter for antenatal screening of mother: Secondary | ICD-10-CM

## 2014-08-20 DIAGNOSIS — Z331 Pregnant state, incidental: Secondary | ICD-10-CM

## 2014-08-20 DIAGNOSIS — Z3481 Encounter for supervision of other normal pregnancy, first trimester: Secondary | ICD-10-CM

## 2014-08-20 DIAGNOSIS — O0993 Supervision of high risk pregnancy, unspecified, third trimester: Secondary | ICD-10-CM

## 2014-08-20 LAB — POCT URINALYSIS DIPSTICK
Blood, UA: NEGATIVE
Glucose, UA: NEGATIVE
Ketones, UA: NEGATIVE
LEUKOCYTES UA: NEGATIVE
NITRITE UA: NEGATIVE
Protein, UA: NEGATIVE

## 2014-08-20 NOTE — Progress Notes (Signed)
High Risk Pregnancy Diagnosis(es):   Class B DM, chronic hypertension  G2P1001 [redacted]w[redacted]d Estimated Date of Delivery: 02/24/15  Blood pressure 100/60, weight 211 lb (95.709 kg).  Urinalysis: Negative   HPI: Pt states her blood sugars are good, she did not bring them  And she is informed of my demand of bringing them every time   BP weight and urine results all reviewed and noted. Patient reports good fetal movement, denies any bleeding and no rupture of membranes symptoms or regular contractions.  Fundal Height:  na Fetal Heart rate:  159 Edema:  none  Patient is without complaints. All questions were answered.  Assessment:  [redacted]w[redacted]d,   Class B DM chronic hypertension  Medication(s) Plans:  No changes  Treatment Plan:  No changes  Follow up in 4 weeks for OB appt, 2nd IT

## 2014-08-20 NOTE — Progress Notes (Signed)
U/S(13+1wks)-active fetus, CRL c/w dates, NB present,NT-1.1mm, anterior Gr 0 placenta, cx appears closed, bilateral adnexa appears WNL, FHR- 159 bpm

## 2014-08-21 ENCOUNTER — Other Ambulatory Visit: Payer: Self-pay | Admitting: *Deleted

## 2014-08-21 MED ORDER — ACCU-CHEK SOFTCLIX LANCETS MISC: Status: DC

## 2014-08-21 MED ORDER — GLUCOSE BLOOD VI STRP: ORAL_STRIP | Status: DC

## 2014-08-27 LAB — MATERNAL SCREEN, INTEGRATED #1

## 2014-09-17 ENCOUNTER — Encounter: Payer: Self-pay | Admitting: *Deleted

## 2014-09-17 ENCOUNTER — Encounter: Payer: Medicaid Other | Admitting: Obstetrics & Gynecology

## 2014-09-18 ENCOUNTER — Ambulatory Visit (INDEPENDENT_AMBULATORY_CARE_PROVIDER_SITE_OTHER): Payer: Medicaid Other | Admitting: Advanced Practice Midwife

## 2014-09-18 VITALS — BP 120/60 | Wt 208.0 lb

## 2014-09-18 DIAGNOSIS — Z3682 Encounter for antenatal screening for nuchal translucency: Secondary | ICD-10-CM

## 2014-09-18 DIAGNOSIS — O09521 Supervision of elderly multigravida, first trimester: Secondary | ICD-10-CM

## 2014-09-18 DIAGNOSIS — Z363 Encounter for antenatal screening for malformations: Secondary | ICD-10-CM

## 2014-09-18 DIAGNOSIS — Z331 Pregnant state, incidental: Secondary | ICD-10-CM

## 2014-09-18 DIAGNOSIS — O24319 Unspecified pre-existing diabetes mellitus in pregnancy, unspecified trimester: Secondary | ICD-10-CM

## 2014-09-18 DIAGNOSIS — Z1389 Encounter for screening for other disorder: Secondary | ICD-10-CM

## 2014-09-18 DIAGNOSIS — O09892 Supervision of other high risk pregnancies, second trimester: Secondary | ICD-10-CM

## 2014-09-18 LAB — POCT URINALYSIS DIPSTICK
Blood, UA: NEGATIVE
Glucose, UA: NEGATIVE
KETONES UA: NEGATIVE
Leukocytes, UA: NEGATIVE
NITRITE UA: NEGATIVE

## 2014-09-18 MED ORDER — GLYBURIDE 2.5 MG PO TABS: 2.5000 mg | ORAL_TABLET | Freq: Every day | ORAL | Status: DC

## 2014-09-18 NOTE — Progress Notes (Signed)
High Risk Pregnancy Diagnosis(es):   Class BDM, AM   G2P1001 [redacted]w[redacted]d Estimated Date of Delivery: 02/24/15  Blood pressure 120/60, weight 208 lb (94.348 kg).  Urinalysis: trace protein, negative glucose   HPI: Not on any blood sugar meds.  Hates metformin d/t GI SE.  All PP within range, very few FBS within range.  Most in the 100-110 range      BP weight and urine results all reviewed and noted. Patient reports good fetal movement, denies any bleeding and no rupture of membranes symptoms or regular contractions.  Fetal Heart rate:  145 Edema:  no  Patient is without complaints. All questions were answered.  Assessment:  [redacted]w[redacted]d,   Class B DM, high FBS  Medication(s) Plans:  Start Glyburide 2.5mg  q hs  Treatment Plan:  Anatomy scan 2 weeks, then weekly growth [place].  Plan echo ~ 28 weeks  Follow up in 2 weeks for OB appt, anatomy scan

## 2014-09-23 LAB — MATERNAL SCREEN, INTEGRATED #2
AFP MoM: 1.04
AFP, SERUM MAT SCREEN: 33.2 ng/mL
Age risk Down Syndrome: 1:43 {titer}
CROWN RUMP LENGTH MAT SCREEN 2: 73.6 mm
Calculated Gestational Age: 17.4
Estriol Mom: 0.78
Estriol, Free: 0.82 ng/mL
Inhibin A Dimeric: 355 pg/mL
Inhibin A MoM: 2.5
MSS Down Syndrome: 1:130 {titer}
MSS Trisomy 18 Risk: <1:5000 {titer}
NT MOM MAT SCREEN: 1.22
NUCHAL TRANSLUCENCY MAT SCREEN 2: 1.97 mm
Number of fetuses: 1
PAPP-A MAT SCREEN: 769 ng/mL
PAPP-A MoM: 1.38
Rish for ONTD: 1:630 {titer}
hCG MoM: 1.21
hCG, Serum: 26.9 IU/mL

## 2014-09-24 ENCOUNTER — Encounter: Payer: Self-pay | Admitting: Advanced Practice Midwife

## 2014-09-24 ENCOUNTER — Telehealth: Payer: Self-pay | Admitting: *Deleted

## 2014-09-24 DIAGNOSIS — O280 Abnormal hematological finding on antenatal screening of mother: Secondary | ICD-10-CM | POA: Insufficient documentation

## 2014-09-24 NOTE — Telephone Encounter (Signed)
Kim from [facility] called stating pt Maternal Screen positive for Down Syndrome, Negative open NTD, Negative for Trisomy 18, NT MOM 1.22. Results are in [place].

## 2014-09-24 NOTE — Telephone Encounter (Signed)
Discussed NT/IT results with incr DSR. Pt plans to come tomorrow for CFDNA (use whatever test [facility] offers per [facility]).

## 2014-09-24 NOTE — Telephone Encounter (Signed)
10:28 AM Left message to call FT to go over test results.  CRESENZO-DISHMAN,Bernard Slayden

## 2014-09-25 ENCOUNTER — Other Ambulatory Visit: Payer: Medicaid Other

## 2014-09-25 DIAGNOSIS — O289 Unspecified abnormal findings on antenatal screening of mother: Secondary | ICD-10-CM

## 2014-10-02 ENCOUNTER — Encounter: Payer: Self-pay | Admitting: Women's Health

## 2014-10-02 ENCOUNTER — Ambulatory Visit (INDEPENDENT_AMBULATORY_CARE_PROVIDER_SITE_OTHER): Payer: Medicaid Other | Admitting: Women's Health

## 2014-10-02 ENCOUNTER — Other Ambulatory Visit: Payer: Medicaid Other

## 2014-10-02 VITALS — BP 102/58 | Wt 210.0 lb

## 2014-10-02 DIAGNOSIS — O09522 Supervision of elderly multigravida, second trimester: Secondary | ICD-10-CM

## 2014-10-02 DIAGNOSIS — Z331 Pregnant state, incidental: Secondary | ICD-10-CM

## 2014-10-02 DIAGNOSIS — O09892 Supervision of other high risk pregnancies, second trimester: Secondary | ICD-10-CM

## 2014-10-02 DIAGNOSIS — Z1389 Encounter for screening for other disorder: Secondary | ICD-10-CM

## 2014-10-02 DIAGNOSIS — O24319 Unspecified pre-existing diabetes mellitus in pregnancy, unspecified trimester: Secondary | ICD-10-CM

## 2014-10-02 LAB — POCT URINALYSIS DIPSTICK
Blood, UA: NEGATIVE
GLUCOSE UA: NEGATIVE
Ketones, UA: NEGATIVE
Leukocytes, UA: NEGATIVE
NITRITE UA: NEGATIVE
Protein, UA: NEGATIVE

## 2014-10-02 NOTE — Addendum Note (Signed)
Addended by: Wells Guiles R on: 10/02/2014 11:24 AM   Modules accepted: Level of Service

## 2014-10-02 NOTE — Patient Instructions (Signed)
Increase Glyburide to 5mg  every night  Second Trimester of Pregnancy The second trimester is from week 13 through week 28, months 4 through 6. The second trimester is often a time when you feel your best. Your body has also adjusted to being pregnant, and you begin to feel better physically. Usually, morning sickness has lessened or quit completely, you may have more energy, and you may have an increase in appetite. The second trimester is also a time when the fetus is growing rapidly. At the end of the sixth month, the fetus is about 9 inches long and weighs about 1 pounds. You will likely begin to feel the baby move (quickening) between 18 and 20 weeks of the pregnancy. BODY CHANGES Your body goes through many changes during pregnancy. The changes vary from woman to woman.   Your weight will continue to increase. You will notice your lower abdomen bulging out.  You may begin to get stretch marks on your hips, abdomen, and breasts.  You may develop headaches that can be relieved by medicines approved by your health care provider.  You may urinate more often because the fetus is pressing on your bladder.  You may develop or continue to have heartburn as a result of your pregnancy.  You may develop constipation because certain hormones are causing the muscles that push waste through your intestines to slow down.  You may develop hemorrhoids or swollen, bulging veins (varicose veins).  You may have back pain because of the weight gain and pregnancy hormones relaxing your joints between the bones in your pelvis and as a result of a shift in weight and the muscles that support your balance.  Your breasts will continue to grow and be tender.  Your gums may bleed and may be sensitive to brushing and flossing.  Dark spots or blotches (chloasma, mask of pregnancy) may develop on your face. This will likely fade after the baby is born.  A dark line from your belly button to the pubic area (linea  nigra) may appear. This will likely fade after the baby is born.  You may have changes in your hair. These can include thickening of your hair, rapid growth, and changes in texture. Some women also have hair loss during or after pregnancy, or hair that feels dry or thin. Your hair will most likely return to normal after your baby is born. WHAT TO EXPECT AT YOUR PRENATAL VISITS During a routine prenatal visit:  You will be weighed to make sure you and the fetus are growing normally.  Your blood pressure will be taken.  Your abdomen will be measured to track your baby's growth.  The fetal heartbeat will be listened to.  Any test results from the previous visit will be discussed. Your health care provider may ask you:  How you are feeling.  If you are feeling the baby move.  If you have had any abnormal symptoms, such as leaking fluid, bleeding, severe headaches, or abdominal cramping.  If you have any questions. Other tests that may be performed during your second trimester include:  Blood tests that check for:  Low iron levels (anemia).  Gestational diabetes (between 24 and 28 weeks).  Rh antibodies.  Urine tests to check for infections, diabetes, or protein in the urine.  An ultrasound to confirm the proper growth and development of the baby.  An amniocentesis to check for possible genetic problems.  Fetal screens for spina bifida and Down syndrome. HOME CARE INSTRUCTIONS  Avoid all smoking, herbs, alcohol, and unprescribed drugs. These chemicals affect the formation and growth of the baby.  Follow your health care provider's instructions regarding medicine use. There are medicines that are either safe or unsafe to take during pregnancy.  Exercise only as directed by your health care provider. Experiencing uterine cramps is a good sign to stop exercising.  Continue to eat regular, healthy meals.  Wear a good support bra for breast tenderness.  Do not use hot  tubs, steam rooms, or saunas.  Wear your seat belt at all times when driving.  Avoid raw meat, uncooked cheese, cat litter boxes, and soil used by cats. These carry germs that can cause birth defects in the baby.  Take your prenatal vitamins.  Try taking a stool softener (if your health care provider approves) if you develop constipation. Eat more high-fiber foods, such as fresh vegetables or fruit and whole grains. Drink plenty of fluids to keep your urine clear or pale yellow.  Take warm sitz baths to soothe any pain or discomfort caused by hemorrhoids. Use hemorrhoid cream if your health care provider approves.  If you develop varicose veins, wear support hose. Elevate your feet for 15 minutes, 3-4 times a day. Limit salt in your diet.  Avoid heavy lifting, wear low heel shoes, and practice good posture.  Rest with your legs elevated if you have leg cramps or low back pain.  Visit your dentist if you have not gone yet during your pregnancy. Use a soft toothbrush to brush your teeth and be gentle when you floss.  A sexual relationship may be continued unless your health care provider directs you otherwise.  Continue to go to all your prenatal visits as directed by your health care provider. SEEK MEDICAL CARE IF:   You have dizziness.  You have mild pelvic cramps, pelvic pressure, or nagging pain in the abdominal area.  You have persistent nausea, vomiting, or diarrhea.  You have a bad smelling vaginal discharge.  You have pain with urination. SEEK IMMEDIATE MEDICAL CARE IF:   You have a fever.  You are leaking fluid from your vagina.  You have spotting or bleeding from your vagina.  You have severe abdominal cramping or pain.  You have rapid weight gain or loss.  You have shortness of breath with chest pain.  You notice sudden or extreme swelling of your face, hands, ankles, feet, or legs.  You have not felt your baby move in over an hour.  You have severe  headaches that do not go away with medicine.  You have vision changes. Document Released: 08/10/2001 Document Revised: 08/21/2013 Document Reviewed: 10/17/2012 [facility] Patient Information 2015 [place], [place]. This information is not intended to replace advice given to you by your health care provider. Make sure you discuss any questions you have with your health care provider.

## 2014-10-02 NOTE — Progress Notes (Signed)
High Risk Pregnancy Diagnosis(es): Class B DM, AMA, Increased r/f DS on IT, cfDNA pending G2P1001 [redacted]w[redacted]d Estimated Date of Delivery: 02/24/15 BP 102/58 mmHg  Wt 210 lb (95.255 kg)  LMP  (LMP Unknown)  Urinalysis: Negative HPI:  Doing good checking sugars, all fastings >90 (in 90s-110s), nearly all 2hr pp normal BP, weight, and urine reviewed.  Reports good fm. Denies regular uc's, lof, vb, uti s/s. No complaints. Is down to 1/2ppd smoking. Was scheduled for anatomy u/s today, but tasha had to leave, rescheduled for next week.   Fundal Height:  19wks Fetal Heart rate:  132 Edema:  none  Reviewed ptl s/s, fm.  All questions were answered Assessment: [redacted]w[redacted]d Class B DM, AMA, Increased r/f DS, cfDNA pending Medication(s) Plans:  Discussed w/ JVF, will increase glyburide to 5mg  qhs Treatment Plan:  Continue current plan Follow up in 2wks for high-risk OB appt to see how fasting sugars are doing, will call her w/ cfDNA results

## 2014-10-03 ENCOUNTER — Telehealth: Payer: Self-pay | Admitting: Advanced Practice Midwife

## 2014-10-03 ENCOUNTER — Telehealth: Payer: Self-pay | Admitting: *Deleted

## 2014-10-03 DIAGNOSIS — O09892 Supervision of other high risk pregnancies, second trimester: Secondary | ICD-10-CM

## 2014-10-03 DIAGNOSIS — O280 Abnormal hematological finding on antenatal screening of mother: Secondary | ICD-10-CM

## 2014-10-03 DIAGNOSIS — O24319 Unspecified pre-existing diabetes mellitus in pregnancy, unspecified trimester: Secondary | ICD-10-CM

## 2014-10-03 DIAGNOSIS — O09522 Supervision of elderly multigravida, second trimester: Secondary | ICD-10-CM

## 2014-10-03 LAB — INFORMASEQ(SM) WITH XY ANALYSIS
FETAL NUMBER: 1
Fetal Fraction (%):: 10.1
Gestational Age at Collection: 18.3 weeks
Weight: 207 [lb_av]

## 2014-10-03 NOTE — Telephone Encounter (Signed)
Pt states that Lisa Norton saw her yesterday and increased glyburide to 5 mg every night and this morning her sugar was 75 and she is concerned. Dr. Glo Herring spoke to pt about above and explained everything to the pt. Pt verbalized understanding.

## 2014-10-03 NOTE — Telephone Encounter (Signed)
Normal risk CFDNA

## 2014-10-08 ENCOUNTER — Ambulatory Visit (INDEPENDENT_AMBULATORY_CARE_PROVIDER_SITE_OTHER): Payer: Medicaid Other

## 2014-10-08 ENCOUNTER — Other Ambulatory Visit: Payer: Self-pay | Admitting: Advanced Practice Midwife

## 2014-10-08 DIAGNOSIS — O289 Unspecified abnormal findings on antenatal screening of mother: Secondary | ICD-10-CM

## 2014-10-08 DIAGNOSIS — Z363 Encounter for antenatal screening for malformations: Secondary | ICD-10-CM

## 2014-10-08 DIAGNOSIS — Z36 Encounter for antenatal screening of mother: Secondary | ICD-10-CM

## 2014-10-08 DIAGNOSIS — Z3689 Encounter for other specified antenatal screening: Secondary | ICD-10-CM

## 2014-10-08 DIAGNOSIS — O09522 Supervision of elderly multigravida, second trimester: Secondary | ICD-10-CM

## 2014-10-08 DIAGNOSIS — O24912 Unspecified diabetes mellitus in pregnancy, second trimester: Secondary | ICD-10-CM

## 2014-10-08 NOTE — Progress Notes (Signed)
U/S(20+1wks)-vtx active fetus, meas c/w dates, fluid WNL, anterior gr 0 placenta, cx appears closed (3.1cm), bilateral adnexa appears WNl, FHR- 136 bpm, female fetus, no obvious abnl noted although unable to view cardiac OFT's on today's exam, would like to reck anatomy ~28 weeks

## 2014-10-08 NOTE — Progress Notes (Signed)
U/S(20+1wks)-

## 2014-10-15 ENCOUNTER — Ambulatory Visit (INDEPENDENT_AMBULATORY_CARE_PROVIDER_SITE_OTHER): Payer: Medicaid Other | Admitting: Obstetrics & Gynecology

## 2014-10-15 ENCOUNTER — Encounter: Payer: Self-pay | Admitting: Obstetrics & Gynecology

## 2014-10-15 VITALS — BP 120/60 | Wt 214.0 lb

## 2014-10-15 DIAGNOSIS — Z331 Pregnant state, incidental: Secondary | ICD-10-CM

## 2014-10-15 DIAGNOSIS — O24319 Unspecified pre-existing diabetes mellitus in pregnancy, unspecified trimester: Secondary | ICD-10-CM

## 2014-10-15 DIAGNOSIS — Z1389 Encounter for screening for other disorder: Secondary | ICD-10-CM

## 2014-10-15 DIAGNOSIS — O09521 Supervision of elderly multigravida, first trimester: Secondary | ICD-10-CM

## 2014-10-15 DIAGNOSIS — O09892 Supervision of other high risk pregnancies, second trimester: Secondary | ICD-10-CM

## 2014-10-15 LAB — POCT URINALYSIS DIPSTICK
Glucose, UA: 2
Ketones, UA: NEGATIVE
Leukocytes, UA: NEGATIVE
Nitrite, UA: NEGATIVE
Protein, UA: NEGATIVE
RBC UA: NEGATIVE

## 2014-10-15 MED ORDER — GLYBURIDE 5 MG PO TABS: 2.5000 mg | ORAL_TABLET | Freq: Every day | ORAL | Status: DC

## 2014-10-15 NOTE — Progress Notes (Signed)
High Risk Pregnancy Diagnosis(es):   Class B DM, chronic hypertension  G2P1001 [redacted]w[redacted]d Estimated Date of Delivery: 02/24/15  Blood pressure 120/60, weight 214 lb (97.07 kg).  Urinalysis: Negative   HPI: Blood sugars look good, continue 5 mg glyburide daily     BP weight and urine results all reviewed and noted. Patient reports good fetal movement, denies any bleeding and no rupture of membranes symptoms or regular contractions.  Fundal Height:  22 Fetal Heart rate:  145 Edema:  npone  Patient is without complaints. All questions were answered.  Assessment:  [redacted]w[redacted]d,   Class B DM, history remote of chronic hypertension  Medication(s) Plans:  Glyburide 5 mg nightly  Treatment Plan:  As above  Follow up in 2 weeks for appointment for high risk OB care,

## 2014-10-28 ENCOUNTER — Telehealth: Payer: Self-pay | Admitting: *Deleted

## 2014-10-28 NOTE — Telephone Encounter (Signed)
Pt states having a rash with itching off and on for 2 weeks, no change in soap or detergent. Pt informed to take Benadryl 25 mg OTC if no improvement to call our office back to be seen Pt verbalized understanding.

## 2014-10-31 ENCOUNTER — Ambulatory Visit (INDEPENDENT_AMBULATORY_CARE_PROVIDER_SITE_OTHER): Payer: Medicaid Other | Admitting: Obstetrics & Gynecology

## 2014-10-31 ENCOUNTER — Encounter: Payer: Self-pay | Admitting: Obstetrics & Gynecology

## 2014-10-31 VITALS — BP 110/60 | HR 72 | Wt 215.0 lb

## 2014-10-31 DIAGNOSIS — O24419 Gestational diabetes mellitus in pregnancy, unspecified control: Secondary | ICD-10-CM | POA: Diagnosis not present

## 2014-10-31 DIAGNOSIS — Z331 Pregnant state, incidental: Secondary | ICD-10-CM

## 2014-10-31 DIAGNOSIS — Z1389 Encounter for screening for other disorder: Secondary | ICD-10-CM | POA: Diagnosis not present

## 2014-10-31 DIAGNOSIS — IMO0001 Reserved for inherently not codable concepts without codable children: Secondary | ICD-10-CM

## 2014-10-31 DIAGNOSIS — O09892 Supervision of other high risk pregnancies, second trimester: Secondary | ICD-10-CM

## 2014-10-31 LAB — POCT URINALYSIS DIPSTICK
Glucose, UA: 2
Ketones, UA: NEGATIVE
Leukocytes, UA: NEGATIVE
Nitrite, UA: NEGATIVE
PROTEIN UA: NEGATIVE
RBC UA: NEGATIVE

## 2014-10-31 MED ORDER — RANITIDINE HCL 150 MG PO TABS: 150.0000 mg | ORAL_TABLET | Freq: Two times a day (BID) | ORAL | Status: DC

## 2014-10-31 NOTE — Addendum Note (Signed)
Addended by: Doyne Keel on: 10/31/2014 12:18 PM   Modules accepted: Orders

## 2014-10-31 NOTE — Progress Notes (Signed)
Fetal Surveillance Testing today:  None today   High Risk Pregnancy Diagnosis(es):   Class B diabetes   G2P1001 [redacted]w[redacted]d Estimated Date of Delivery: 02/24/15  Blood pressure 110/60, pulse 72, weight 215 lb (97.523 kg).  Urinalysis: Positive for 2+ glucose   HPI: The patient is being seen today for ongoing management of Class B DM. Today she reports CBG are pretty good.   BP weight and urine results all reviewed and noted. Patient reports good fetal movement, denies any bleeding and no rupture of membranes symptoms or regular contractions.  Fundal Height:  24 Fetal Heart rate:  145 Edema:  none  Patient is without complaints other than noted in her HPI. All questions were answered.  All lab and sonogram results have been reviewed. Comments: normal   Assessment:  1.  Pregnancy at [redacted]w[redacted]d,  Estimated Date of Delivery: 02/24/15 :                          2.  Class B diabetes: CBG ok continue glyburide 5 mg qhs                        3.  C HTN: BP good now  Medication(s) Plans:  Continue glyburide 5 mg qhs  Treatment Plan:  2 weeks  Follow up in 2 weeks for appointment for high risk OB care,

## 2014-11-05 ENCOUNTER — Telehealth: Payer: Self-pay | Admitting: Obstetrics & Gynecology

## 2014-11-05 NOTE — Telephone Encounter (Signed)
Pt informed Glyburide 5 mg po hs clarified with pharmacy.

## 2014-11-12 ENCOUNTER — Telehealth: Payer: Self-pay | Admitting: *Deleted

## 2014-11-12 NOTE — Telephone Encounter (Signed)
Pt c/o a lot of pressure, + FM, swelling lower extremities, no gush of fluids or vaginal bleeding. Pt states she had a fall in the shower x 1 day ago and hit head first went to [facility] and u/s was done and FHR WNL. Pt informed informed can take tylenol for pressure, push fluids, rest, elevate extremities, decrease salt intake. Pt informed to go to [facility] if pain or pressure increase, change in FM, gush of fluids, vaginal bleeding. Pt verbalized understanding. Call transferred to front staff for an appt to be scheduled for tomorrow am. Dr. Glo Herring informed and agreed with recommendations.

## 2014-11-13 ENCOUNTER — Ambulatory Visit (INDEPENDENT_AMBULATORY_CARE_PROVIDER_SITE_OTHER): Payer: Medicaid Other | Admitting: Advanced Practice Midwife

## 2014-11-13 VITALS — BP 114/58 | HR 76 | Wt 218.0 lb

## 2014-11-13 DIAGNOSIS — O09892 Supervision of other high risk pregnancies, second trimester: Secondary | ICD-10-CM

## 2014-11-13 DIAGNOSIS — Z1389 Encounter for screening for other disorder: Secondary | ICD-10-CM

## 2014-11-13 DIAGNOSIS — Z331 Pregnant state, incidental: Secondary | ICD-10-CM

## 2014-11-13 DIAGNOSIS — O24319 Unspecified pre-existing diabetes mellitus in pregnancy, unspecified trimester: Secondary | ICD-10-CM

## 2014-11-13 LAB — POCT URINALYSIS DIPSTICK
Glucose, UA: NEGATIVE
Ketones, UA: NEGATIVE
NITRITE UA: NEGATIVE
PROTEIN UA: NEGATIVE

## 2014-11-13 NOTE — Patient Instructions (Addendum)
Take 2.5mg  Glyburide in the morning and 5mg  in the evening  Echo scheduled for 12/05/14 @ [place] ([facility]) Suite [phone]  .

## 2014-11-13 NOTE — Progress Notes (Signed)
Fetal Surveillance Testing today:  none   High Risk Pregnancy Diagnosis(es):   Class B DM  G2P1001 [redacted]w[redacted]d Estimated Date of Delivery: 02/24/15  Blood pressure 114/58, pulse 76, weight 218 lb (98.884 kg).  Urinalysis: Negative   HPI: The patient is being seen today for ongoing management of Class B DM.  Marland Kitchen Today she reports having fallen in the bathtub on Friday.  She went to [facility] and had an [place]; everything checked out OK. No bleeding.  C/O less FM than usual, but still feels movement many times throughout the day.  Has been feeling pelvic pressure off and on for over a week.    BP weight and urine results all reviewed and noted. Did not bring BS log.  Reports FBS all <95, has been having "most" pp >140 Patient denies any bleeding and no rupture of membranes symptoms or regular contractions.  Fundal Height:  25 Fetal Heart rate:  139 Edema:  trace  Patient is without complaints other than noted in her HPI. All questions were answered.  All lab and sonogram results have been reviewed. Comments: CX firm, LTC, no presenting part felt in pelvis  Assessment:  1.  Pregnancy at [redacted]w[redacted]d,  Estimated Date of Delivery: 02/24/15 :  Class B DM, suboptimal control pp                        2.  5 days S/P fall, no apparent sequelae                        3.  Pelvic pressure, no cx change, not concerning for PTL  Medication(s) Plans:  Add am dose of Glyburide 2.5mg ; continue 5mg  qhs  Treatment Plan:  Growth [place] 3 weeks  Echo cardiogram scheduled for 4/7 @ 11am  Follow up in 3 weeks for appointment for high risk OB care, Growth [place]

## 2014-11-14 ENCOUNTER — Encounter: Payer: Medicaid Other | Admitting: Obstetrics & Gynecology

## 2014-11-21 ENCOUNTER — Telehealth: Payer: Self-pay | Admitting: Advanced Practice Midwife

## 2014-11-21 NOTE — Telephone Encounter (Signed)
Pt states had an episode of diarrhea last night and a couple of times today with cramping, no fever. Pt "thinks she has a stomach bug." Pt informed try imodium OTC, push fluids. If no improvement call our office back. Pt verbalized understanding.

## 2014-12-04 ENCOUNTER — Encounter: Payer: Self-pay | Admitting: Advanced Practice Midwife

## 2014-12-04 ENCOUNTER — Ambulatory Visit (INDEPENDENT_AMBULATORY_CARE_PROVIDER_SITE_OTHER): Payer: Medicaid Other

## 2014-12-04 ENCOUNTER — Other Ambulatory Visit: Payer: Self-pay | Admitting: Advanced Practice Midwife

## 2014-12-04 ENCOUNTER — Telehealth: Payer: Self-pay | Admitting: Advanced Practice Midwife

## 2014-12-04 ENCOUNTER — Ambulatory Visit (INDEPENDENT_AMBULATORY_CARE_PROVIDER_SITE_OTHER): Payer: Medicaid Other | Admitting: Advanced Practice Midwife

## 2014-12-04 VITALS — BP 118/60 | HR 72 | Wt 220.0 lb

## 2014-12-04 DIAGNOSIS — O24319 Unspecified pre-existing diabetes mellitus in pregnancy, unspecified trimester: Secondary | ICD-10-CM

## 2014-12-04 DIAGNOSIS — Z1389 Encounter for screening for other disorder: Secondary | ICD-10-CM | POA: Diagnosis not present

## 2014-12-04 DIAGNOSIS — Z114 Encounter for screening for human immunodeficiency virus [HIV]: Secondary | ICD-10-CM

## 2014-12-04 DIAGNOSIS — Z331 Pregnant state, incidental: Secondary | ICD-10-CM

## 2014-12-04 DIAGNOSIS — O09523 Supervision of elderly multigravida, third trimester: Secondary | ICD-10-CM

## 2014-12-04 DIAGNOSIS — O09893 Supervision of other high risk pregnancies, third trimester: Secondary | ICD-10-CM

## 2014-12-04 DIAGNOSIS — O403XX0 Polyhydramnios, third trimester, not applicable or unspecified: Secondary | ICD-10-CM

## 2014-12-04 DIAGNOSIS — O403XX1 Polyhydramnios, third trimester, fetus 1: Secondary | ICD-10-CM

## 2014-12-04 DIAGNOSIS — Z0184 Encounter for antibody response examination: Secondary | ICD-10-CM

## 2014-12-04 DIAGNOSIS — R768 Other specified abnormal immunological findings in serum: Secondary | ICD-10-CM

## 2014-12-04 DIAGNOSIS — O289 Unspecified abnormal findings on antenatal screening of mother: Secondary | ICD-10-CM | POA: Diagnosis not present

## 2014-12-04 DIAGNOSIS — O280 Abnormal hematological finding on antenatal screening of mother: Secondary | ICD-10-CM

## 2014-12-04 DIAGNOSIS — Z113 Encounter for screening for infections with a predominantly sexual mode of transmission: Secondary | ICD-10-CM

## 2014-12-04 HISTORY — DX: Other specified abnormal immunological findings in serum: R76.8

## 2014-12-04 LAB — POCT URINALYSIS DIPSTICK
Blood, UA: NEGATIVE
Glucose, UA: NEGATIVE
Ketones, UA: NEGATIVE
Leukocytes, UA: NEGATIVE
Nitrite, UA: NEGATIVE
Protein, UA: NEGATIVE

## 2014-12-04 NOTE — Patient Instructions (Signed)
[facility] [place], [place]

## 2014-12-04 NOTE — Telephone Encounter (Signed)
Pt states she needs refill for the Glyburide 2.5 mg and the directions changed due to she is now taking Glyburide 2.5 mg am and 5 mg hs.

## 2014-12-04 NOTE — Progress Notes (Addendum)
Fetal Surveillance Testing today: Follow up US today for growth and recheck anatomy at 28+[redacted] weeks GA. Single, active female fetus in a cephalic presentation. Anterior Gr 1 placenta. Bilateral ovaries not well visualized. AFI: 28 cm with a SVP of 8.6cm (polyhydramnios). FHR 164 bpm. Outflow tracts and arches visualized today and appear normal. Patient is for Fetal ECHO next week. EFW today of 1698 g (86% overall with head and abdominal measurements > 97%. BPP completed due to polyhydramnios, 8/8.    High Risk Pregnancy Diagnosis(es):   Class B DM  G2P1001 [redacted]w[redacted]d Estimated Date of Delivery: 02/24/15  There were no vitals taken for this visit.  Urinalysis: Normal   HPI: The patient is being seen today for ongoing management of pregnancy and Class B DM. Today she reports no complaints other than some swelling, varicose veins Blood Sugars:  Fasting: <99   2 hours PP:  Only a few >120, highest 152   BP weight and urine results all reviewed and noted. Patient reports good fetal movement, denies any bleeding and no rupture of membranes symptoms or regular contractions.  Fundal Height:  34, but has a panus that makes measuring difficult and probably inaccurate Fetal Heart rate:  164 Edema:  trace  Patient is without complaints other than noted in her HPI. All questions were answered.  All lab and sonogram results have been reviewed. Comments: New dx of polyhydramnios  Assessment:  1.  Pregnancy at [redacted]w[redacted]d,  Estimated Date of Delivery: 02/24/15 :                          2.  Class B DM:                          3.  polyhydramnios  Medication(s) Plans:  No change  Treatment Plan:  Has fetal echo scheduled 4/14.  Weekly BPP/AFI (polyhydramnios) with growth [place] 32/35/38 weeks; Add NSt weekly >32 weeks.  PN2 labs today  Follow up in 1week for appointment for high risk OB care, [place]: AFI/BPP

## 2014-12-04 NOTE — Progress Notes (Signed)
Follow up US today for growth and recheck anatomy at 28+[redacted] weeks GA.  Single, active female fetus in a cephalic presentation.  Anterior Gr 1 placenta.  Bilateral ovaries not well visualized. AFI: 28 cm with a SVP of 8.6cm (polyhydramnios).  FHR 164 bpm. Outflow tracts and arches visualized today and appear normal.  Patient is for Fetal ECHO next week.  EFW today of 1698 g (86% overall with head and abdominal measurements > 97%. BPP completed due to polyhydramnios, 8/8.

## 2014-12-05 ENCOUNTER — Encounter: Payer: Self-pay | Admitting: Advanced Practice Midwife

## 2014-12-05 DIAGNOSIS — R768 Other specified abnormal immunological findings in serum: Secondary | ICD-10-CM | POA: Insufficient documentation

## 2014-12-05 LAB — HSV 2 ANTIBODY, IGG: HSV 2 GLYCOPROTEIN G AB, IGG: 6.07 {index} — AB (ref 0.00–0.90)

## 2014-12-05 LAB — CBC
HCT: 32.7 % — ABNORMAL LOW (ref 34.0–46.6)
Hemoglobin: 11.3 g/dL (ref 11.1–15.9)
MCH: 33.5 pg — ABNORMAL HIGH (ref 26.6–33.0)
MCHC: 34.6 g/dL (ref 31.5–35.7)
MCV: 97 fL (ref 79–97)
Platelets: 251 10*3/uL (ref 150–379)
RBC: 3.37 x10E6/uL — ABNORMAL LOW (ref 3.77–5.28)
RDW: 13.4 % (ref 12.3–15.4)
WBC: 13.4 10*3/uL — ABNORMAL HIGH (ref 3.4–10.8)

## 2014-12-05 LAB — ANTIBODY SCREEN: Antibody Screen: NEGATIVE

## 2014-12-05 LAB — HIV ANTIBODY (ROUTINE TESTING W REFLEX): HIV SCREEN 4TH GENERATION: NONREACTIVE

## 2014-12-05 LAB — RPR: RPR Ser Ql: NONREACTIVE

## 2014-12-05 MED ORDER — GLYBURIDE 2.5 MG PO TABS: 2.5000 mg | ORAL_TABLET | Freq: Every morning | ORAL | Status: DC

## 2014-12-05 NOTE — Telephone Encounter (Signed)
Reordered glyburide

## 2014-12-11 ENCOUNTER — Encounter: Payer: Self-pay | Admitting: Obstetrics & Gynecology

## 2014-12-11 ENCOUNTER — Ambulatory Visit (INDEPENDENT_AMBULATORY_CARE_PROVIDER_SITE_OTHER): Payer: Medicaid Other | Admitting: Obstetrics & Gynecology

## 2014-12-11 ENCOUNTER — Other Ambulatory Visit: Payer: Medicaid Other

## 2014-12-11 ENCOUNTER — Ambulatory Visit (INDEPENDENT_AMBULATORY_CARE_PROVIDER_SITE_OTHER): Payer: Medicaid Other

## 2014-12-11 VITALS — BP 118/60 | HR 72 | Wt 227.4 lb

## 2014-12-11 DIAGNOSIS — O403XX0 Polyhydramnios, third trimester, not applicable or unspecified: Secondary | ICD-10-CM

## 2014-12-11 DIAGNOSIS — O24419 Gestational diabetes mellitus in pregnancy, unspecified control: Secondary | ICD-10-CM | POA: Diagnosis not present

## 2014-12-11 DIAGNOSIS — O403XX1 Polyhydramnios, third trimester, fetus 1: Secondary | ICD-10-CM | POA: Diagnosis not present

## 2014-12-11 DIAGNOSIS — IMO0001 Reserved for inherently not codable concepts without codable children: Secondary | ICD-10-CM

## 2014-12-11 DIAGNOSIS — O09893 Supervision of other high risk pregnancies, third trimester: Secondary | ICD-10-CM | POA: Diagnosis not present

## 2014-12-11 DIAGNOSIS — O280 Abnormal hematological finding on antenatal screening of mother: Secondary | ICD-10-CM

## 2014-12-11 DIAGNOSIS — Z331 Pregnant state, incidental: Secondary | ICD-10-CM | POA: Diagnosis not present

## 2014-12-11 DIAGNOSIS — Z1389 Encounter for screening for other disorder: Secondary | ICD-10-CM

## 2014-12-11 DIAGNOSIS — O24319 Unspecified pre-existing diabetes mellitus in pregnancy, unspecified trimester: Secondary | ICD-10-CM

## 2014-12-11 DIAGNOSIS — O09523 Supervision of elderly multigravida, third trimester: Secondary | ICD-10-CM

## 2014-12-11 DIAGNOSIS — R768 Other specified abnormal immunological findings in serum: Secondary | ICD-10-CM

## 2014-12-11 LAB — POCT URINALYSIS DIPSTICK
Blood, UA: NEGATIVE
GLUCOSE UA: NEGATIVE
Ketones, UA: NEGATIVE
Leukocytes, UA: NEGATIVE
NITRITE UA: NEGATIVE

## 2014-12-11 NOTE — Progress Notes (Signed)
[place] BPP 0/8,YEM 33KP poly,cephalic,ant pl grade 1,normal ov's,limited view of cx,rt renal pelvis .39mm n/c

## 2014-12-11 NOTE — Progress Notes (Signed)
Fetal Surveillance Testing today:  Sonogram is performed and reveals polyhydramnios(Class B Diabetes)   High Risk Pregnancy Diagnosis(es):   Class B DM with polyhydramnios  G2P1001 [redacted]w[redacted]d Estimated Date of Delivery: 02/24/15  Blood pressure 118/60, pulse 72, weight 227 lb 6.4 oz (103.148 kg).  Urinalysis: Negative   HPI: The patient is being seen today for ongoing management of Class B DM. Today she reports CBG are very good   BP weight and urine results all reviewed and noted. Patient reports good fetal movement, denies any bleeding and no rupture of membranes symptoms or regular contractions.  Fundal Height:  36 Fetal Heart rate:  145 Edema:  1+  Patient is without complaints other than noted in her HPI. All questions were answered.  All lab and sonogram results have been reviewed. Comments: abnormal: polyhydramnios   Assessment:  1.  Pregnancy at [redacted]w[redacted]d,  Estimated Date of Delivery: 02/24/15 :                          2.  Class B DM                        3.  polyhydramnios  Medication(s) Plans:  Glyburide 5 mg hs and 2.5 mg qAM  Treatment Plan:  Twice weekly at 32 weeks  Follow up in 2 weeks for appointment for high risk OB care,

## 2014-12-11 NOTE — Addendum Note (Signed)
Addended by: Doyne Keel on: 12/11/2014 04:45 PM   Modules accepted: Orders

## 2014-12-18 ENCOUNTER — Encounter (HOSPITAL_COMMUNITY): Payer: Self-pay

## 2014-12-18 ENCOUNTER — Inpatient Hospital Stay (HOSPITAL_COMMUNITY)
Admission: AD | Admit: 2014-12-18 | Discharge: 2014-12-18 | Disposition: A | Discharge status: Home / Self Care | Payer: Medicaid Other | Source: Ambulatory Visit | Attending: Obstetrics & Gynecology | Admitting: Obstetrics & Gynecology

## 2014-12-18 DIAGNOSIS — O4703 False labor before 37 completed weeks of gestation, third trimester: Secondary | ICD-10-CM

## 2014-12-18 DIAGNOSIS — Z3A30 30 weeks gestation of pregnancy: Secondary | ICD-10-CM | POA: Diagnosis not present

## 2014-12-18 DIAGNOSIS — O09523 Supervision of elderly multigravida, third trimester: Secondary | ICD-10-CM | POA: Diagnosis not present

## 2014-12-18 LAB — URINALYSIS, ROUTINE W REFLEX MICROSCOPIC
BILIRUBIN URINE: NEGATIVE
Glucose, UA: NEGATIVE mg/dL
HGB URINE DIPSTICK: NEGATIVE
KETONES UR: NEGATIVE mg/dL
NITRITE: NEGATIVE
PH: 6 (ref 5.0–8.0)
Protein, ur: NEGATIVE mg/dL
Specific Gravity, Urine: 1.025 (ref 1.005–1.030)
Urobilinogen, UA: 1 mg/dL (ref 0.0–1.0)

## 2014-12-18 LAB — URINE MICROSCOPIC-ADD ON

## 2014-12-18 NOTE — Discharge Instructions (Signed)
Braxton Hicks Contractions °Contractions of the uterus can occur throughout pregnancy. Contractions are not always a sign that you are in labor.  °WHAT ARE BRAXTON HICKS CONTRACTIONS?  °Contractions that occur before labor are called Braxton Hicks contractions, or false labor. Toward the end of pregnancy (32-34 weeks), these contractions can develop more often and may become more forceful. This is not true labor because these contractions do not result in opening (dilatation) and thinning of the cervix. They are sometimes difficult to tell apart from true labor because these contractions can be forceful and people have different pain tolerances. You should not feel embarrassed if you go to the hospital with false labor. Sometimes, the only way to tell if you are in true labor is for your health care provider to look for changes in the cervix. °If there are no prenatal problems or other health problems associated with the pregnancy, it is completely safe to be sent home with false labor and await the onset of true labor. °HOW CAN YOU TELL THE DIFFERENCE BETWEEN TRUE AND FALSE LABOR? °False Labor °· The contractions of false labor are usually shorter and not as hard as those of true labor.   °· The contractions are usually irregular.   °· The contractions are often felt in the front of the lower abdomen and in the groin.   °· The contractions may go away when you walk around or change positions while lying down.   °· The contractions get weaker and are shorter lasting as time goes on.   °· The contractions do not usually become progressively stronger, regular, and closer together as with true labor.   °True Labor °· Contractions in true labor last 30-70 seconds, become very regular, usually become more intense, and increase in frequency.   °· The contractions do not go away with walking.   °· The discomfort is usually felt in the top of the uterus and spreads to the lower abdomen and low back.   °· True labor can be  determined by your health care provider with an exam. This will show that the cervix is dilating and getting thinner.   °WHAT TO REMEMBER °· Keep up with your usual exercises and follow other instructions given by your health care provider.   °· Take medicines as directed by your health care provider.   °· Keep your regular prenatal appointments.   °· Eat and drink lightly if you think you are going into labor.   °· If Braxton Hicks contractions are making you uncomfortable:   °¨ Change your position from lying down or resting to walking, or from walking to resting.   °¨ Sit and rest in a tub of warm water.   °¨ Drink 2-3 glasses of water. Dehydration may cause these contractions.   °¨ Do slow and deep breathing several times an hour.   °WHEN SHOULD I SEEK IMMEDIATE MEDICAL CARE? °Seek immediate medical care if: °· Your contractions become stronger, more regular, and closer together.   °· You have fluid leaking or gushing from your vagina.   °· You have a fever.   °· You pass blood-tinged mucus.   °· You have vaginal bleeding.   °· You have continuous abdominal pain.   °· You have low back pain that you never had before.   °· You feel your baby's head pushing down and causing pelvic pressure.   °· Your baby is not moving as much as it used to.   °Document Released: 08/16/2005 Document Revised: 08/21/2013 Document Reviewed: 05/28/2013 °Exit[employer]® Patient Information ©2015 [employer]. This information is not intended to replace advice given to you by your health care   provider. Make sure you discuss any questions you have with your health care provider. ° °

## 2014-12-18 NOTE — MAU Note (Signed)
Contractions since about 5:00 am today, denies vaginal bleeding, leaking or discharge, positive FM, patient denies any prior problems during pregnancy.

## 2014-12-18 NOTE — MAU Provider Note (Signed)
History     CSN: [id number]  Arrival date and time: 12/18/14 7209   First Provider Initiated Contact with Patient 12/18/14 [phone]      Chief Complaint  Patient presents with  . Contractions   HPI   Lisa Norton is a 44 y.o. female G2P1001 at [redacted]w[redacted]d who presents with contractions that started this morning around 0500. She was feeling the contractions every 10 minuet and they were bad enough that she had to leave work. Currently she is not feeling contractions, she feels that they have "gone away".  She rates her pain currently 0/10. She denies vaginal bleeding, or leaking of fluid. She is feeling good fetal movements. She has not had problems with preterm labor or preterm contractions with this pregnancy. She is currently receiving her care at [facility].   OB history High Risk Pregnancy Diagnosis(es):Class B DM with polyhydramnios  OB History    Gravida Para Term Preterm AB TAB SAB Ectopic Multiple Living   2 1 1  0 0 0 0 0 0 1      Past Medical History  Diagnosis Date  . COLD SORE 05/15/2010  . TOBACCO ABUSE 12/12/2009  . DEPRESSION 12/12/2009  . HYPERTENSION 12/12/2009  . PREDIABETES 02/11/2010  . Diabetes mellitus without complication   . 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 Cholecystectomy  2006  . Tonsillectomy  1988  . Cervical fusion      Family History  Problem Relation Age of Onset  . Heart disease Mother   . Narcolepsy Mother     History  Substance Use Topics  . Smoking status: Current Every Day Smoker -- 0.50 packs/day for 20 years    Types: Cigarettes  . Smokeless tobacco: Not on file  . Alcohol Use: No    Allergies: No Known Allergies  Prescriptions prior to admission  Medication Sig Dispense Refill Last Dose  . clonazePAM (KLONOPIN) 0.5 MG tablet TAKE ONE TABLET BY MOUTH TWICE DAILY AS NEEDED FOR ANXIETY 20 tablet 0 12/17/2014 at Unknown time  . FLUoxetine (PROZAC) 40 MG capsule Take 40 mg by  mouth daily.   12/18/2014 at Unknown time  . glyBURIDE (DIABETA) 2.5 MG tablet Take 1 tablet (2.5 mg total) by mouth every morning. And take 2 tablets (5mg ) qhs 90 tablet 3 12/18/2014 at Unknown time  . Prenat-FeFum-FePo-FA-Omega 3 (CONCEPT DHA) 53.5-38-1 MG CAPS Take 1 capsule by mouth daily. 30 capsule 11 12/18/2014 at Unknown time  . ranitidine (ZANTAC) 150 MG tablet Take 1 tablet (150 mg total) by mouth 2 (two) times daily. 60 tablet 2 12/18/2014 at Unknown time  . ACCU-CHEK SOFTCLIX LANCETS lancets Use as instructed 100 each 12 Taking  . buPROPion (WELLBUTRIN SR) 150 MG 12 hr tablet Take 1 tablet (150 mg total) by mouth 2 (two) times daily. (Patient not taking: Reported on 12/11/2014) 60 tablet 2 Not Taking  . glucose blood (ONE TOUCH ULTRA TEST) test strip Use as instructed daily 100 each 3 Taking  . glucose blood test strip Use as instructed 100 each 12 Taking  . HYDROcodone-acetaminophen (NORCO/VICODIN) 5-325 MG per tablet Take 1-2 tablets by mouth every 6 (six) hours as needed for severe pain. (Patient not taking: Reported on 08/12/2014) 30 tablet 0 Not Taking  . metFORMIN (GLUCOPHAGE) 500 MG tablet TAKE ONE TABLET BY MOUTH TWICE DAILY WITH  A  MEAL (Patient not taking: Reported on 08/12/2014) 60 tablet 5 Not Taking  . ONETOUCH DELICA LANCETS  MISC Use as instructed daily 100 each 3 Taking   Results for orders placed or performed during the hospital encounter of 12/18/14 (from the past 48 hour(s))  Urinalysis, Routine w reflex microscopic     Status: Abnormal   Collection Time: 12/18/14  7:48 AM  Result Value Ref Range   Color, Urine YELLOW YELLOW   APPearance HAZY (A) CLEAR   Specific Gravity, Urine 1.025 1.005 - 1.030   pH 6.0 5.0 - 8.0   Glucose, UA NEGATIVE NEGATIVE mg/dL   Hgb urine dipstick NEGATIVE NEGATIVE   Bilirubin Urine NEGATIVE NEGATIVE   Ketones, ur NEGATIVE NEGATIVE mg/dL   Protein, ur NEGATIVE NEGATIVE mg/dL   Urobilinogen, UA 1.0 0.0 - 1.0 mg/dL   Nitrite NEGATIVE  NEGATIVE   Leukocytes, UA TRACE (A) NEGATIVE  Urine microscopic-add on     Status: Abnormal   Collection Time: 12/18/14  7:48 AM  Result Value Ref Range   Squamous Epithelial / LPF FEW (A) RARE   WBC, UA 0-2 <3 WBC/hpf   Bacteria, UA RARE RARE    Review of Systems  Constitutional: Negative for fever and chills.  Gastrointestinal: Negative for nausea and vomiting.   Physical Exam   Blood pressure 118/64, pulse 80, temperature 98.3 F (36.8 C), temperature source Oral, resp. rate 16, height 5' 2.5" (1.588 m), weight 101.334 kg (223 lb 6.4 oz).  Physical Exam  Constitutional: She is oriented to person, place, and time. She appears well-developed and well-nourished. No distress.  GI: Soft.  Genitourinary:  Dilation: Closed Effacement (%): Thick Cervical Position: Posterior Exam by:: Rebekah Chesterfield NP  Musculoskeletal: Normal range of motion.  Neurological: She is alert and oriented to person, place, and time.  Skin: Skin is warm. She is not diaphoretic.  Psychiatric: Her behavior is normal.   Fetal Tracing: Baseline: 115 bpm  Variability: moderate  Accelerations: 15x15 Decelerations: none Toco: one contraction noted.   MAU Course  Procedures  None  MDM   Assessment and Plan   A:  1. Preterm contractions, third trimester     P:  Discharge home in stable condition Preterm contractions Return to MAU if symptoms worsen [facility]

## 2014-12-25 ENCOUNTER — Encounter: Payer: Self-pay | Admitting: Obstetrics & Gynecology

## 2014-12-25 ENCOUNTER — Ambulatory Visit (INDEPENDENT_AMBULATORY_CARE_PROVIDER_SITE_OTHER): Payer: Medicaid Other | Admitting: Obstetrics & Gynecology

## 2014-12-25 VITALS — BP 112/50 | HR 92 | Wt 225.0 lb

## 2014-12-25 DIAGNOSIS — IMO0001 Reserved for inherently not codable concepts without codable children: Secondary | ICD-10-CM

## 2014-12-25 DIAGNOSIS — Z331 Pregnant state, incidental: Secondary | ICD-10-CM | POA: Diagnosis not present

## 2014-12-25 DIAGNOSIS — O24419 Gestational diabetes mellitus in pregnancy, unspecified control: Secondary | ICD-10-CM

## 2014-12-25 DIAGNOSIS — Z1389 Encounter for screening for other disorder: Secondary | ICD-10-CM | POA: Diagnosis not present

## 2014-12-25 DIAGNOSIS — O09893 Supervision of other high risk pregnancies, third trimester: Secondary | ICD-10-CM | POA: Diagnosis not present

## 2014-12-25 LAB — POCT URINALYSIS DIPSTICK
Blood, UA: NEGATIVE
Glucose, UA: NEGATIVE
KETONES UA: NEGATIVE
Leukocytes, UA: NEGATIVE
NITRITE UA: NEGATIVE
Protein, UA: NEGATIVE

## 2014-12-25 NOTE — Progress Notes (Signed)
Fetal Surveillance Testing today:  none   High Risk Pregnancy Diagnosis(es):   Class B DM, polyhydramnios  G2P1001 [redacted]w[redacted]d Estimated Date of Delivery: 02/24/15  Blood pressure 112/50, pulse 92, weight 225 lb (102.059 kg).  Urinalysis: Negative   HPI: The patient is being seen today for ongoing management of Class B Dm. Today she reports CBG are good, did not bring them with her(had an excuse which I did not accept)   BP weight and urine results all reviewed and noted. Patient reports good fetal movement, denies any bleeding and no rupture of membranes symptoms or regular contractions.  Fundal Height:  U+22 Fetal Heart rate:  135 Edema:    Patient is without complaints other than noted in her HPI. All questions were answered.  All lab and sonogram results have been reviewed. Comments: abnormal polyhydramnios  Assessment:  1.  Pregnancy at [redacted]w[redacted]d,  Estimated Date of Delivery: 02/24/15 :                          2.  Class B DM                        3.  Polyhydramnios  Medication(s) Plans:  2.5 mg am, 5 mg pm   Treatment Plan:  No changes  Follow up in 1 weeks for appointment for high risk OB care, NST

## 2015-01-08 ENCOUNTER — Other Ambulatory Visit: Payer: Medicaid Other | Admitting: Women's Health

## 2015-01-08 ENCOUNTER — Other Ambulatory Visit: Payer: Medicaid Other | Admitting: Obstetrics & Gynecology

## 2015-01-09 ENCOUNTER — Other Ambulatory Visit: Payer: Medicaid Other | Admitting: Obstetrics & Gynecology

## 2015-01-09 ENCOUNTER — Other Ambulatory Visit: Payer: Medicaid Other

## 2015-01-11 LAB — US OB FOLLOW UP

## 2015-01-13 ENCOUNTER — Encounter: Payer: Self-pay | Admitting: Obstetrics & Gynecology

## 2015-01-13 ENCOUNTER — Ambulatory Visit (INDEPENDENT_AMBULATORY_CARE_PROVIDER_SITE_OTHER): Payer: Medicaid Other | Admitting: Obstetrics & Gynecology

## 2015-01-13 VITALS — BP 130/60 | HR 80 | Wt 227.0 lb

## 2015-01-13 DIAGNOSIS — O24419 Gestational diabetes mellitus in pregnancy, unspecified control: Secondary | ICD-10-CM | POA: Diagnosis not present

## 2015-01-13 DIAGNOSIS — Z1389 Encounter for screening for other disorder: Secondary | ICD-10-CM

## 2015-01-13 DIAGNOSIS — Z331 Pregnant state, incidental: Secondary | ICD-10-CM

## 2015-01-13 DIAGNOSIS — O403XX9 Polyhydramnios, third trimester, other fetus: Secondary | ICD-10-CM | POA: Diagnosis not present

## 2015-01-13 DIAGNOSIS — O403XX0 Polyhydramnios, third trimester, not applicable or unspecified: Secondary | ICD-10-CM | POA: Insufficient documentation

## 2015-01-13 DIAGNOSIS — O09893 Supervision of other high risk pregnancies, third trimester: Secondary | ICD-10-CM

## 2015-01-13 DIAGNOSIS — IMO0001 Reserved for inherently not codable concepts without codable children: Secondary | ICD-10-CM | POA: Insufficient documentation

## 2015-01-13 LAB — POCT URINALYSIS DIPSTICK
Blood, UA: NEGATIVE
GLUCOSE UA: NEGATIVE
KETONES UA: NEGATIVE
LEUKOCYTES UA: NEGATIVE
NITRITE UA: NEGATIVE
Protein, UA: 1

## 2015-01-13 MED ORDER — GLYBURIDE 5 MG PO TABS: ORAL_TABLET | ORAL | Status: DC

## 2015-01-13 NOTE — Progress Notes (Signed)
Fetal Surveillance Testing today:  Reactive NST   High Risk Pregnancy Diagnosis(es):   Class B Diabetes, polyhydramnios and macrosomia  G2P1001 [redacted]w[redacted]d Estimated Date of Delivery: 02/24/15  Blood pressure 130/60, pulse 80, weight 227 lb (102.967 kg).  Urinalysis: Positive for 1+ protein   HPI: The patient is being seen today for ongoing management of Class B DM. Today she reports CBG are sub optimal, will increase glyburide to 5 mg AM and 10 mg PM   BP weight and urine results all reviewed and noted. Patient reports good fetal movement, denies any bleeding and no rupture of membranes symptoms or regular contractions.  Fundal Height:  38 Fetal Heart rate:  140 Edema:  none  Patient is without complaints other than noted in her HPI. All questions were answered.  All lab and sonogram results have been reviewed. Comments:    Assessment:  1.  Pregnancy at [redacted]w[redacted]d,  Estimated Date of Delivery: 02/24/15 :                          2.  Class B DM                        3.  Macrosomia/polyhydramnios as a result of #2  Medication(s) Plans:  Glyburide 5 AM/ 10 PM  Treatment Plan:  As above, twice weekly assessments  Follow up in Thursday weeks for appointment for high risk OB care, sonogram

## 2015-01-16 ENCOUNTER — Ambulatory Visit (INDEPENDENT_AMBULATORY_CARE_PROVIDER_SITE_OTHER): Payer: Medicaid Other

## 2015-01-16 ENCOUNTER — Ambulatory Visit (INDEPENDENT_AMBULATORY_CARE_PROVIDER_SITE_OTHER): Payer: Medicaid Other | Admitting: Obstetrics & Gynecology

## 2015-01-16 ENCOUNTER — Encounter: Payer: Self-pay | Admitting: Obstetrics & Gynecology

## 2015-01-16 VITALS — BP 130/80 | HR 72 | Wt 228.0 lb

## 2015-01-16 DIAGNOSIS — Z1389 Encounter for screening for other disorder: Secondary | ICD-10-CM

## 2015-01-16 DIAGNOSIS — O24419 Gestational diabetes mellitus in pregnancy, unspecified control: Secondary | ICD-10-CM | POA: Diagnosis not present

## 2015-01-16 DIAGNOSIS — O403XX1 Polyhydramnios, third trimester, fetus 1: Secondary | ICD-10-CM

## 2015-01-16 DIAGNOSIS — O403XX9 Polyhydramnios, third trimester, other fetus: Secondary | ICD-10-CM

## 2015-01-16 DIAGNOSIS — O09523 Supervision of elderly multigravida, third trimester: Secondary | ICD-10-CM

## 2015-01-16 DIAGNOSIS — O09893 Supervision of other high risk pregnancies, third trimester: Secondary | ICD-10-CM | POA: Diagnosis not present

## 2015-01-16 DIAGNOSIS — IMO0001 Reserved for inherently not codable concepts without codable children: Secondary | ICD-10-CM

## 2015-01-16 DIAGNOSIS — O24319 Unspecified pre-existing diabetes mellitus in pregnancy, unspecified trimester: Secondary | ICD-10-CM

## 2015-01-16 DIAGNOSIS — Z331 Pregnant state, incidental: Secondary | ICD-10-CM | POA: Diagnosis not present

## 2015-01-16 DIAGNOSIS — O280 Abnormal hematological finding on antenatal screening of mother: Secondary | ICD-10-CM

## 2015-01-16 DIAGNOSIS — R768 Other specified abnormal immunological findings in serum: Secondary | ICD-10-CM

## 2015-01-16 LAB — POCT URINALYSIS DIPSTICK
Blood, UA: NEGATIVE
GLUCOSE UA: NEGATIVE
Ketones, UA: NEGATIVE
Leukocytes, UA: NEGATIVE
Nitrite, UA: NEGATIVE

## 2015-01-16 LAB — US OB FOLLOW UP

## 2015-01-16 MED ORDER — INSULIN GLARGINE 100 UNIT/ML SOLOSTAR PEN: 20.0000 [IU] | PEN_INJECTOR | Freq: Every day | SUBCUTANEOUS | Status: DC

## 2015-01-16 NOTE — Progress Notes (Signed)
[place] 34+3d, large for dates, efw 3653g 90%,polyhydramnios afi 29cm, dilated renal pelvis rt kid .98cm,lt kid .63cm,ant pl gr 2,BPP 8/8,RI .68,.71,.73

## 2015-01-16 NOTE — Addendum Note (Signed)
Addended by: Doyne Keel on: 01/16/2015 03:22 PM   Modules accepted: Orders

## 2015-01-16 NOTE — Progress Notes (Signed)
Fetal Surveillance Testing today:  Sonogram reveals mildly elevated Dopplers with good diastolic flow, macrosomia/polyhydramnios   High Risk Pregnancy Diagnosis(es):   Class B DM  G2P1001 [redacted]w[redacted]d Estimated Date of Delivery: 02/24/15  Blood pressure 130/80, pulse 72, weight 228 lb (103.42 kg).  Urinalysis: Negative   HPI: The patient is being seen today for ongoing management of Class B DM. Today she reports CBG are elevated change to 5 am/5 pm glyburide and add 20 units Lantus   BP weight and urine results all reviewed and noted. Patient reports good fetal movement, denies any bleeding and no rupture of membranes symptoms or regular contractions.  Fundal Height:  38 Fetal Heart rate:  145 Edema:  none  Patient is without complaints other than noted in her HPI. All questions were answered.  All lab and sonogram results have been reviewed. Comments: abnormal: see report   Assessment:  1.  Pregnancy at [redacted]w[redacted]d,  Estimated Date of Delivery: 02/24/15 :                          2.  Class B DM                        3.  Mild Doppler ratio elevation                        4.  Macrosomia/polyhydramnios  Medication(s) Plans:  As above  Treatment Plan:  sono weekly NST weekly alt  Follow up in Monday weeks for appointment for high risk OB care, NST

## 2015-01-17 ENCOUNTER — Encounter: Payer: Medicaid Other | Admitting: Obstetrics & Gynecology

## 2015-01-21 ENCOUNTER — Ambulatory Visit (INDEPENDENT_AMBULATORY_CARE_PROVIDER_SITE_OTHER): Payer: Medicaid Other | Admitting: Obstetrics & Gynecology

## 2015-01-21 ENCOUNTER — Encounter: Payer: Self-pay | Admitting: Obstetrics & Gynecology

## 2015-01-21 VITALS — BP 122/70 | HR 76 | Wt 228.0 lb

## 2015-01-21 DIAGNOSIS — Z1389 Encounter for screening for other disorder: Secondary | ICD-10-CM

## 2015-01-21 DIAGNOSIS — O403XX1 Polyhydramnios, third trimester, fetus 1: Secondary | ICD-10-CM

## 2015-01-21 DIAGNOSIS — Z331 Pregnant state, incidental: Secondary | ICD-10-CM | POA: Diagnosis not present

## 2015-01-21 DIAGNOSIS — O24419 Gestational diabetes mellitus in pregnancy, unspecified control: Secondary | ICD-10-CM | POA: Diagnosis not present

## 2015-01-21 DIAGNOSIS — IMO0001 Reserved for inherently not codable concepts without codable children: Secondary | ICD-10-CM

## 2015-01-21 DIAGNOSIS — O09893 Supervision of other high risk pregnancies, third trimester: Secondary | ICD-10-CM

## 2015-01-21 LAB — POCT URINALYSIS DIPSTICK
Blood, UA: NEGATIVE
GLUCOSE UA: NEGATIVE
KETONES UA: NEGATIVE
LEUKOCYTES UA: NEGATIVE
Nitrite, UA: NEGATIVE

## 2015-01-21 NOTE — Progress Notes (Signed)
Pt denies any problems or concerns at this time.  

## 2015-01-21 NOTE — Progress Notes (Signed)
Fetal Surveillance Testing today:  Reactive NST   High Risk Pregnancy Diagnosis(es):   Class A2 DM  G2P1001 [redacted]w[redacted]d Estimated Date of Delivery: 02/24/15  Blood pressure 122/70, pulse 76, weight 228 lb (103.42 kg).  Urinalysis: Negative   HPI: The patient is being seen today for ongoing management of Class B DM. Today she reports excellent BS control on cbg review   BP weight and urine results all reviewed and noted. Patient reports good fetal movement, denies any bleeding and no rupture of membranes symptoms or regular contractions.  Fundal Height:  40 Fetal Heart rate:  135 Edema:  none  Patient is without complaints other than noted in her HPI. All questions were answered.  All lab and sonogram results have been reviewed. Comments: abnormal: fetal macrosomia   Assessment:  1.  Pregnancy at [redacted]w[redacted]d,  Estimated Date of Delivery: 02/24/15 :                          2.  Class B DM                        3.  Fetal Macrosomia  Medication(s) Plans:  2.5 glyburide am +5 mg pm+ lantus 20 qhs  Treatment Plan:  Twice weekly assessments  Follow up in friday weeks for appointment for high risk OB care, nst

## 2015-01-24 ENCOUNTER — Encounter: Payer: Self-pay | Admitting: Obstetrics & Gynecology

## 2015-01-24 ENCOUNTER — Other Ambulatory Visit: Payer: Medicaid Other

## 2015-01-24 ENCOUNTER — Ambulatory Visit (INDEPENDENT_AMBULATORY_CARE_PROVIDER_SITE_OTHER): Payer: Medicaid Other | Admitting: Obstetrics & Gynecology

## 2015-01-24 VITALS — BP 130/60 | HR 76 | Wt 232.0 lb

## 2015-01-24 DIAGNOSIS — O09893 Supervision of other high risk pregnancies, third trimester: Secondary | ICD-10-CM | POA: Diagnosis not present

## 2015-01-24 DIAGNOSIS — IMO0001 Reserved for inherently not codable concepts without codable children: Secondary | ICD-10-CM

## 2015-01-24 DIAGNOSIS — Z1389 Encounter for screening for other disorder: Secondary | ICD-10-CM | POA: Diagnosis not present

## 2015-01-24 DIAGNOSIS — O24419 Gestational diabetes mellitus in pregnancy, unspecified control: Secondary | ICD-10-CM | POA: Diagnosis not present

## 2015-01-24 DIAGNOSIS — O403XX0 Polyhydramnios, third trimester, not applicable or unspecified: Secondary | ICD-10-CM

## 2015-01-24 DIAGNOSIS — Z331 Pregnant state, incidental: Secondary | ICD-10-CM | POA: Diagnosis not present

## 2015-01-24 LAB — POCT URINALYSIS DIPSTICK
Blood, UA: NEGATIVE
Glucose, UA: NEGATIVE
Ketones, UA: NEGATIVE
Leukocytes, UA: NEGATIVE
NITRITE UA: NEGATIVE

## 2015-01-24 NOTE — Progress Notes (Signed)
Fetal Surveillance Testing today:  Reactive NST   High Risk Pregnancy Diagnosis(es):   Class B diabetes, fetal macrosomia, advanced maternal age  G10P1001 [redacted]w[redacted]d Estimated Date of Delivery: 02/24/15  Blood pressure 130/60, pulse 76, weight 232 lb (105.235 kg).  Urinalysis: Negative   HPI: The patient is being seen today for ongoing management of class B diabetes. Today she reports swelling backache pelvic pressure is ready to come out of work CBGs are a little high I will increase her morning glyburide from 2.5 mg to 5 mg and keep her p.m. at 5 mg and keep her Lantus at 20 units   BP weight and urine results all reviewed and noted. Patient reports good fetal movement, denies any bleeding and no rupture of membranes symptoms or regular contractions.  Fundal Height:  42 Fetal Heart rate:  145 Edema:  2+  Patient is without complaints other than noted in her HPI. All questions were answered.  All lab and sonogram results have been reviewed. Comments: abnormal: Fetal macrosomia   Assessment:  1.  Pregnancy at [redacted]w[redacted]d,  Estimated Date of Delivery: 02/24/15 :                           2.  Class B diabetes                        3.  Fetal macrosomia  Medication(s) Plans:  As stated above  Treatment Plan:  Twice weekly assessments, repeat estimated fetal weight at 37 weeks  Follow up in tuesday weeks for appointment for high risk OB care, NST

## 2015-01-28 ENCOUNTER — Encounter: Payer: Self-pay | Admitting: Obstetrics & Gynecology

## 2015-01-28 ENCOUNTER — Other Ambulatory Visit: Payer: Self-pay | Admitting: *Deleted

## 2015-01-28 ENCOUNTER — Ambulatory Visit (INDEPENDENT_AMBULATORY_CARE_PROVIDER_SITE_OTHER): Payer: Medicaid Other | Admitting: Obstetrics & Gynecology

## 2015-01-28 VITALS — BP 130/70 | HR 84 | Wt 231.0 lb

## 2015-01-28 DIAGNOSIS — O24319 Unspecified pre-existing diabetes mellitus in pregnancy, unspecified trimester: Secondary | ICD-10-CM | POA: Diagnosis not present

## 2015-01-28 DIAGNOSIS — Z1389 Encounter for screening for other disorder: Secondary | ICD-10-CM

## 2015-01-28 DIAGNOSIS — Z331 Pregnant state, incidental: Secondary | ICD-10-CM | POA: Diagnosis not present

## 2015-01-28 DIAGNOSIS — O09893 Supervision of other high risk pregnancies, third trimester: Secondary | ICD-10-CM | POA: Diagnosis not present

## 2015-01-28 DIAGNOSIS — IMO0001 Reserved for inherently not codable concepts without codable children: Secondary | ICD-10-CM

## 2015-01-28 LAB — POCT URINALYSIS DIPSTICK
Blood, UA: NEGATIVE
Glucose, UA: NEGATIVE
KETONES UA: NEGATIVE
Leukocytes, UA: NEGATIVE
Nitrite, UA: NEGATIVE

## 2015-01-28 MED ORDER — RANITIDINE HCL 150 MG PO TABS: 150.0000 mg | ORAL_TABLET | Freq: Two times a day (BID) | ORAL | Status: DC

## 2015-01-28 NOTE — Progress Notes (Signed)
Fetal Surveillance Testing today:  Reactive NST   High Risk Pregnancy Diagnosis(es):   Class B DM, polyhydramnios/macrosomia as a result of diabetes  G2P1001 [redacted]w[redacted]d Estimated Date of Delivery: 02/24/15  Blood pressure 130/70, pulse 84, weight 231 lb (104.781 kg).  Urinalysis: Negative   HPI: The patient is being seen today for ongoing management of Class B DM. Today she reports some contractions, CBG are good   BP weight and urine results all reviewed and noted. Patient reports good fetal movement, denies any bleeding and no rupture of membranes symptoms or regular contractions.  Fundal Height:  42 Fetal Heart rate:  140 Edema:  none  Patient is without complaints other than noted in her HPI. All questions were answered.  All lab and sonogram results have been reviewed. Comments: abnormal:    Assessment:  1.  Pregnancy at [redacted]w[redacted]d,  Estimated Date of Delivery: 02/24/15 :                          2.  Class B DM                        3.  Poly/macrosomia  Medication(s) Plans:  Glyburide 5 am/pm, lantus 20 hs  Treatment Plan:  No changes  Follow up in friday weeks for appointment for high risk OB care, NST

## 2015-01-31 ENCOUNTER — Ambulatory Visit (INDEPENDENT_AMBULATORY_CARE_PROVIDER_SITE_OTHER): Payer: Medicaid Other | Admitting: Obstetrics & Gynecology

## 2015-01-31 ENCOUNTER — Encounter: Payer: Self-pay | Admitting: Obstetrics & Gynecology

## 2015-01-31 VITALS — BP 128/60 | HR 76 | Wt 231.0 lb

## 2015-01-31 DIAGNOSIS — IMO0001 Reserved for inherently not codable concepts without codable children: Secondary | ICD-10-CM

## 2015-01-31 DIAGNOSIS — Z331 Pregnant state, incidental: Secondary | ICD-10-CM | POA: Diagnosis not present

## 2015-01-31 DIAGNOSIS — O24419 Gestational diabetes mellitus in pregnancy, unspecified control: Secondary | ICD-10-CM

## 2015-01-31 DIAGNOSIS — O09893 Supervision of other high risk pregnancies, third trimester: Secondary | ICD-10-CM

## 2015-01-31 DIAGNOSIS — Z1389 Encounter for screening for other disorder: Secondary | ICD-10-CM | POA: Diagnosis not present

## 2015-01-31 DIAGNOSIS — Z369 Encounter for antenatal screening, unspecified: Secondary | ICD-10-CM

## 2015-01-31 DIAGNOSIS — Z3685 Encounter for antenatal screening for Streptococcus B: Secondary | ICD-10-CM

## 2015-01-31 LAB — POCT URINALYSIS DIPSTICK
Glucose, UA: NEGATIVE
Ketones, UA: NEGATIVE
Leukocytes, UA: NEGATIVE
Nitrite, UA: NEGATIVE
RBC UA: NEGATIVE

## 2015-01-31 NOTE — Addendum Note (Signed)
Addended by: Doyne Keel on: 01/31/2015 11:40 AM   Modules accepted: Orders

## 2015-01-31 NOTE — Progress Notes (Signed)
Fetal Surveillance Testing today:  Reactive NST   High Risk Pregnancy Diagnosis(es):   Class B DM  G2P1001 [redacted]w[redacted]d Estimated Date of Delivery: 02/24/15  Blood pressure 128/60, pulse 76, weight 231 lb (104.781 kg).  Urinalysis: Negative   HPI: The patient is being seen today for ongoing management of Class B DM. Today she reports CBG are excellent will cut back to 15 lantus at hs   BP weight and urine results all reviewed and noted. Patient reports good fetal movement, denies any bleeding and no rupture of membranes symptoms or regular contractions.  Fundal Height:  42 Fetal Heart rate:  140 Edema:  2+  Patient is without complaints other than noted in her HPI. All questions were answered.  All lab and sonogram results have been reviewed. Comments: abnormal:  Fetal macrosomia  Assessment:  1.  Pregnancy at [redacted]w[redacted]d,  Estimated Date of Delivery: 02/24/15 :                          2.  Class B DM                        3.  Fetal macrosomia  Medication(s) Plans:  Cut back lantus to 15 units  Treatment Plan:  Twice weekly assessment  Follow up in Monday weeks for appointment for high risk OB care, NST

## 2015-02-02 LAB — GC/CHLAMYDIA PROBE AMP
Chlamydia trachomatis, NAA: NEGATIVE
Neisseria gonorrhoeae by PCR: NEGATIVE

## 2015-02-03 ENCOUNTER — Ambulatory Visit (INDEPENDENT_AMBULATORY_CARE_PROVIDER_SITE_OTHER): Payer: Medicaid Other | Admitting: Obstetrics & Gynecology

## 2015-02-03 VITALS — BP 130/70 | HR 76 | Wt 233.0 lb

## 2015-02-03 DIAGNOSIS — O24419 Gestational diabetes mellitus in pregnancy, unspecified control: Secondary | ICD-10-CM | POA: Diagnosis not present

## 2015-02-03 DIAGNOSIS — Z1389 Encounter for screening for other disorder: Secondary | ICD-10-CM | POA: Diagnosis not present

## 2015-02-03 DIAGNOSIS — O09893 Supervision of other high risk pregnancies, third trimester: Secondary | ICD-10-CM

## 2015-02-03 DIAGNOSIS — Z331 Pregnant state, incidental: Secondary | ICD-10-CM

## 2015-02-03 DIAGNOSIS — IMO0001 Reserved for inherently not codable concepts without codable children: Secondary | ICD-10-CM

## 2015-02-03 DIAGNOSIS — O403XX0 Polyhydramnios, third trimester, not applicable or unspecified: Secondary | ICD-10-CM

## 2015-02-03 LAB — POCT URINALYSIS DIPSTICK
Blood, UA: NEGATIVE
GLUCOSE UA: NEGATIVE
Ketones, UA: NEGATIVE
LEUKOCYTES UA: NEGATIVE
Nitrite, UA: NEGATIVE
PROTEIN UA: NEGATIVE

## 2015-02-03 NOTE — Progress Notes (Signed)
Fetal Surveillance Testing today:  Reactive NST   High Risk Pregnancy Diagnosis(es):   Class B DM, macrosomia with polyhydramnios  G2P1001 [redacted]w[redacted]d Estimated Date of Delivery: 02/24/15  Blood pressure 130/70, pulse 76, weight 233 lb (105.688 kg).  Urinalysis: Negative   HPI: The patient is being seen today for ongoing management of Class B DM, macrosomia with poly . Today she reports pelvic pressure back ache   BP weight and urine results all reviewed and noted. Patient reports good fetal movement, denies any bleeding and no rupture of membranes symptoms or regular contractions. cbg are good  Fundal Height:  42 Fetal Heart rate:  140 Edema:  1+  Patient is without complaints other than noted in her HPI. All questions were answered.  All lab and sonogram results have been reviewed. Comments: abnormal: as above  Assessment:  1.  Pregnancy at [redacted]w[redacted]d,  Estimated Date of Delivery: 02/24/15 :                          2.  Class B DM                        3.  Macrosomia with polyhydramnios  Medication(s) Plans:  No changes  Treatment Plan:  Sonogram in 3 days  Follow up in thursday weeks for appointment for high risk OB care, sonogram

## 2015-02-04 LAB — CULTURE, BETA STREP (GROUP B ONLY): Strep Gp B Culture: NEGATIVE

## 2015-02-06 ENCOUNTER — Other Ambulatory Visit: Payer: Medicaid Other | Admitting: Obstetrics & Gynecology

## 2015-02-07 ENCOUNTER — Encounter: Payer: Self-pay | Admitting: Obstetrics & Gynecology

## 2015-02-07 ENCOUNTER — Ambulatory Visit (INDEPENDENT_AMBULATORY_CARE_PROVIDER_SITE_OTHER): Payer: Medicaid Other

## 2015-02-07 ENCOUNTER — Ambulatory Visit (INDEPENDENT_AMBULATORY_CARE_PROVIDER_SITE_OTHER): Payer: Medicaid Other | Admitting: Obstetrics & Gynecology

## 2015-02-07 VITALS — BP 128/80 | HR 80 | Wt 231.0 lb

## 2015-02-07 DIAGNOSIS — O24419 Gestational diabetes mellitus in pregnancy, unspecified control: Secondary | ICD-10-CM | POA: Diagnosis not present

## 2015-02-07 DIAGNOSIS — O403XX1 Polyhydramnios, third trimester, fetus 1: Secondary | ICD-10-CM | POA: Diagnosis not present

## 2015-02-07 DIAGNOSIS — IMO0001 Reserved for inherently not codable concepts without codable children: Secondary | ICD-10-CM

## 2015-02-07 DIAGNOSIS — Z331 Pregnant state, incidental: Secondary | ICD-10-CM | POA: Diagnosis not present

## 2015-02-07 DIAGNOSIS — Z1389 Encounter for screening for other disorder: Secondary | ICD-10-CM | POA: Diagnosis not present

## 2015-02-07 DIAGNOSIS — O09523 Supervision of elderly multigravida, third trimester: Secondary | ICD-10-CM

## 2015-02-07 DIAGNOSIS — O24319 Unspecified pre-existing diabetes mellitus in pregnancy, unspecified trimester: Secondary | ICD-10-CM

## 2015-02-07 DIAGNOSIS — O280 Abnormal hematological finding on antenatal screening of mother: Secondary | ICD-10-CM

## 2015-02-07 DIAGNOSIS — O09893 Supervision of other high risk pregnancies, third trimester: Secondary | ICD-10-CM

## 2015-02-07 DIAGNOSIS — Z3A37 37 weeks gestation of pregnancy: Secondary | ICD-10-CM

## 2015-02-07 DIAGNOSIS — O403XX0 Polyhydramnios, third trimester, not applicable or unspecified: Secondary | ICD-10-CM

## 2015-02-07 DIAGNOSIS — O3663X1 Maternal care for excessive fetal growth, third trimester, fetus 1: Secondary | ICD-10-CM | POA: Diagnosis not present

## 2015-02-07 DIAGNOSIS — R768 Other specified abnormal immunological findings in serum: Secondary | ICD-10-CM

## 2015-02-07 LAB — POCT URINALYSIS DIPSTICK
Glucose, UA: 2
Ketones, UA: NEGATIVE
LEUKOCYTES UA: NEGATIVE
Nitrite, UA: NEGATIVE

## 2015-02-07 NOTE — Progress Notes (Signed)
[place] 37+4WKS,EFW 4614G ([id number]) 90%,AC 97.7%,BPD 87.9%,HC 33.8%,FL 48.2%, polyhydramnios 27cm,RI .76,bilat adnexa wnl,cephalic,fht 371GGY,IRS pl gr 3,BPP 8/8

## 2015-02-07 NOTE — Progress Notes (Signed)
Fetal Surveillance Testing today:  Sonogram EFW 4614 grams,  BPP 8/8   High Risk Pregnancy Diagnosis(es):   Class B DM, fetal macrosomia  G2P1001 [redacted]w[redacted]d Estimated Date of Delivery: 02/24/15  Blood pressure 128/80, pulse 80, weight 231 lb (104.781 kg).  Urinalysis: Negative   HPI: The patient is being seen today for ongoing management of Class B DM. Today she reports CBG have been excellent control, 5 BID + lantus 15(dwon from 20)   BP weight and urine results all reviewed and noted. Patient reports good fetal movement, denies any bleeding and no rupture of membranes symptoms or regular contractions.  Fundal Height:  46 Fetal Heart rate:  148 Edema:  trace  Patient is without complaints other than noted in her HPI. All questions were answered.  All lab and sonogram results have been reviewed. Comments: abnormal: Macrosomia with normal fetal surveillance   Assessment:  1.  Pregnancy at [redacted]w[redacted]d,  Estimated Date of Delivery: 02/24/15 :                          2.  Class B DM, well controlled                        3.  Fetal macrosomia with polyhydramnios: C section planned at 39 weeks, 6.22.2016 with BTL  Medication(s) Plans:  Continue 5 glyburide BID with Lantus 15(20 if blood sugars trend up)  Treatment Plan:  twice weekly surveillance  Follow up in Tuesday weeks for appointment for high risk OB care, NST

## 2015-02-11 ENCOUNTER — Other Ambulatory Visit: Payer: Self-pay | Admitting: Obstetrics & Gynecology

## 2015-02-11 ENCOUNTER — Other Ambulatory Visit (INDEPENDENT_AMBULATORY_CARE_PROVIDER_SITE_OTHER): Payer: Medicaid Other

## 2015-02-11 ENCOUNTER — Ambulatory Visit (INDEPENDENT_AMBULATORY_CARE_PROVIDER_SITE_OTHER): Payer: Medicaid Other | Admitting: Obstetrics & Gynecology

## 2015-02-11 ENCOUNTER — Encounter: Payer: Self-pay | Admitting: Obstetrics & Gynecology

## 2015-02-11 VITALS — BP 140/80 | HR 84 | Wt 226.0 lb

## 2015-02-11 DIAGNOSIS — Z1389 Encounter for screening for other disorder: Secondary | ICD-10-CM

## 2015-02-11 DIAGNOSIS — O09893 Supervision of other high risk pregnancies, third trimester: Secondary | ICD-10-CM

## 2015-02-11 DIAGNOSIS — O09523 Supervision of elderly multigravida, third trimester: Secondary | ICD-10-CM

## 2015-02-11 DIAGNOSIS — Z3A38 38 weeks gestation of pregnancy: Secondary | ICD-10-CM

## 2015-02-11 DIAGNOSIS — O3663X1 Maternal care for excessive fetal growth, third trimester, fetus 1: Secondary | ICD-10-CM | POA: Diagnosis not present

## 2015-02-11 DIAGNOSIS — Z331 Pregnant state, incidental: Secondary | ICD-10-CM

## 2015-02-11 DIAGNOSIS — O364XX1 Maternal care for intrauterine death, fetus 1: Secondary | ICD-10-CM

## 2015-02-11 DIAGNOSIS — O403XX1 Polyhydramnios, third trimester, fetus 1: Secondary | ICD-10-CM

## 2015-02-11 DIAGNOSIS — R768 Other specified abnormal immunological findings in serum: Secondary | ICD-10-CM

## 2015-02-11 DIAGNOSIS — O24419 Gestational diabetes mellitus in pregnancy, unspecified control: Secondary | ICD-10-CM | POA: Diagnosis not present

## 2015-02-11 DIAGNOSIS — IMO0002 Reserved for concepts with insufficient information to code with codable children: Secondary | ICD-10-CM

## 2015-02-11 DIAGNOSIS — O280 Abnormal hematological finding on antenatal screening of mother: Secondary | ICD-10-CM

## 2015-02-11 DIAGNOSIS — O24319 Unspecified pre-existing diabetes mellitus in pregnancy, unspecified trimester: Secondary | ICD-10-CM

## 2015-02-11 LAB — POCT URINALYSIS DIPSTICK
Blood, UA: NEGATIVE
GLUCOSE UA: NEGATIVE
Ketones, UA: NEGATIVE
Leukocytes, UA: NEGATIVE
NITRITE UA: NEGATIVE
Protein, UA: NEGATIVE

## 2015-02-11 LAB — US OB FOLLOW UP

## 2015-02-11 MED ORDER — DEXTROSE 5 % IV SOLN
2.0000 g | INTRAVENOUS | Status: AC
  Administered 2015-02-12: 2 g via INTRAVENOUS
  Filled 2015-02-11: qty 2

## 2015-02-11 NOTE — Progress Notes (Signed)
[place] 38+1WK, NO cardiac activity seen,ant pl gr 3, sdp of fluid 9cm,pt was scanned by Dr.Eure to confirm no fht.

## 2015-02-12 ENCOUNTER — Inpatient Hospital Stay (HOSPITAL_COMMUNITY)
Admission: RE | Admit: 2015-02-12 | Discharge: 2015-02-14 | DRG: 765 | Disposition: A | Discharge status: Home / Self Care | Payer: Medicaid Other | Source: Ambulatory Visit | Attending: Obstetrics & Gynecology | Admitting: Obstetrics & Gynecology

## 2015-02-12 ENCOUNTER — Encounter (HOSPITAL_COMMUNITY)
Admission: RE | Disposition: A | Discharge status: Home / Self Care | Payer: Self-pay | Source: Ambulatory Visit | Attending: Obstetrics & Gynecology

## 2015-02-12 ENCOUNTER — Inpatient Hospital Stay (HOSPITAL_COMMUNITY): Payer: Medicaid Other

## 2015-02-12 ENCOUNTER — Inpatient Hospital Stay (HOSPITAL_COMMUNITY): Payer: Medicaid Other | Admitting: Anesthesiology

## 2015-02-12 ENCOUNTER — Encounter (HOSPITAL_COMMUNITY): Payer: Self-pay | Admitting: Registered Nurse

## 2015-02-12 DIAGNOSIS — O364XX0 Maternal care for intrauterine death, not applicable or unspecified: Secondary | ICD-10-CM | POA: Diagnosis present

## 2015-02-12 DIAGNOSIS — O403XX0 Polyhydramnios, third trimester, not applicable or unspecified: Secondary | ICD-10-CM | POA: Diagnosis present

## 2015-02-12 DIAGNOSIS — E119 Type 2 diabetes mellitus without complications: Secondary | ICD-10-CM | POA: Diagnosis present

## 2015-02-12 DIAGNOSIS — Z9049 Acquired absence of other specified parts of digestive tract: Secondary | ICD-10-CM | POA: Diagnosis present

## 2015-02-12 DIAGNOSIS — O99344 Other mental disorders complicating childbirth: Secondary | ICD-10-CM | POA: Diagnosis present

## 2015-02-12 DIAGNOSIS — O2412 Pre-existing diabetes mellitus, type 2, in childbirth: Secondary | ICD-10-CM | POA: Diagnosis present

## 2015-02-12 DIAGNOSIS — O1092 Unspecified pre-existing hypertension complicating childbirth: Secondary | ICD-10-CM | POA: Diagnosis present

## 2015-02-12 DIAGNOSIS — O4593 Premature separation of placenta, unspecified, third trimester: Secondary | ICD-10-CM | POA: Diagnosis present

## 2015-02-12 DIAGNOSIS — O403XX1 Polyhydramnios, third trimester, fetus 1: Secondary | ICD-10-CM

## 2015-02-12 DIAGNOSIS — Z3A38 38 weeks gestation of pregnancy: Secondary | ICD-10-CM | POA: Diagnosis present

## 2015-02-12 DIAGNOSIS — O280 Abnormal hematological finding on antenatal screening of mother: Secondary | ICD-10-CM

## 2015-02-12 DIAGNOSIS — E669 Obesity, unspecified: Secondary | ICD-10-CM | POA: Diagnosis present

## 2015-02-12 DIAGNOSIS — A6000 Herpesviral infection of urogenital system, unspecified: Secondary | ICD-10-CM | POA: Diagnosis present

## 2015-02-12 DIAGNOSIS — Z98891 History of uterine scar from previous surgery: Secondary | ICD-10-CM

## 2015-02-12 DIAGNOSIS — O9832 Other infections with a predominantly sexual mode of transmission complicating childbirth: Secondary | ICD-10-CM | POA: Diagnosis present

## 2015-02-12 DIAGNOSIS — Z981 Arthrodesis status: Secondary | ICD-10-CM

## 2015-02-12 DIAGNOSIS — O09523 Supervision of elderly multigravida, third trimester: Secondary | ICD-10-CM

## 2015-02-12 DIAGNOSIS — O3663X1 Maternal care for excessive fetal growth, third trimester, fetus 1: Secondary | ICD-10-CM

## 2015-02-12 DIAGNOSIS — O99214 Obesity complicating childbirth: Secondary | ICD-10-CM | POA: Diagnosis present

## 2015-02-12 DIAGNOSIS — O99284 Endocrine, nutritional and metabolic diseases complicating childbirth: Secondary | ICD-10-CM | POA: Diagnosis present

## 2015-02-12 DIAGNOSIS — O364XX1 Maternal care for intrauterine death, fetus 1: Secondary | ICD-10-CM

## 2015-02-12 DIAGNOSIS — O364XX9 Maternal care for intrauterine death, other fetus: Secondary | ICD-10-CM

## 2015-02-12 DIAGNOSIS — E785 Hyperlipidemia, unspecified: Secondary | ICD-10-CM | POA: Diagnosis present

## 2015-02-12 DIAGNOSIS — O24319 Unspecified pre-existing diabetes mellitus in pregnancy, unspecified trimester: Secondary | ICD-10-CM

## 2015-02-12 DIAGNOSIS — Z23 Encounter for immunization: Secondary | ICD-10-CM

## 2015-02-12 DIAGNOSIS — F17210 Nicotine dependence, cigarettes, uncomplicated: Secondary | ICD-10-CM | POA: Diagnosis present

## 2015-02-12 DIAGNOSIS — O3663X0 Maternal care for excessive fetal growth, third trimester, not applicable or unspecified: Secondary | ICD-10-CM | POA: Diagnosis present

## 2015-02-12 DIAGNOSIS — R768 Other specified abnormal immunological findings in serum: Secondary | ICD-10-CM

## 2015-02-12 DIAGNOSIS — O24419 Gestational diabetes mellitus in pregnancy, unspecified control: Secondary | ICD-10-CM

## 2015-02-12 DIAGNOSIS — O99334 Smoking (tobacco) complicating childbirth: Secondary | ICD-10-CM | POA: Diagnosis present

## 2015-02-12 DIAGNOSIS — F329 Major depressive disorder, single episode, unspecified: Secondary | ICD-10-CM | POA: Diagnosis present

## 2015-02-12 DIAGNOSIS — Z6841 Body Mass Index (BMI) 40.0 and over, adult: Secondary | ICD-10-CM | POA: Diagnosis not present

## 2015-02-12 LAB — CBC
HCT: 39.9 % (ref 36.0–46.0)
HEMOGLOBIN: 14.2 g/dL (ref 12.0–15.0)
MCH: 34.2 pg — ABNORMAL HIGH (ref 26.0–34.0)
MCHC: 35.6 g/dL (ref 30.0–36.0)
MCV: 96.1 fL (ref 78.0–100.0)
Platelets: 187 10*3/uL (ref 150–400)
RBC: 4.15 MIL/uL (ref 3.87–5.11)
RDW: 14.2 % (ref 11.5–15.5)
WBC: 18.6 10*3/uL — ABNORMAL HIGH (ref 4.0–10.5)

## 2015-02-12 LAB — COMPREHENSIVE METABOLIC PANEL
ALT: 17 U/L (ref 14–54)
ANION GAP: 6 (ref 5–15)
AST: 21 U/L (ref 15–41)
Albumin: 2.7 g/dL — ABNORMAL LOW (ref 3.5–5.0)
Alkaline Phosphatase: 222 U/L — ABNORMAL HIGH (ref 38–126)
BUN: 12 mg/dL (ref 6–20)
CALCIUM: 8.8 mg/dL — AB (ref 8.9–10.3)
CHLORIDE: 107 mmol/L (ref 101–111)
CO2: 22 mmol/L (ref 22–32)
CREATININE: 0.72 mg/dL (ref 0.44–1.00)
GFR calc non Af Amer: >60 mL/min (ref >60)
GLUCOSE: 159 mg/dL — AB (ref 65–99)
Potassium: 4.1 mmol/L (ref 3.5–5.1)
Sodium: 135 mmol/L (ref 135–145)
Total Bilirubin: 0.4 mg/dL (ref 0.3–1.2)
Total Protein: 6.6 g/dL (ref 6.5–8.1)

## 2015-02-12 LAB — TYPE AND SCREEN
ABO/RH(D): O POS
ANTIBODY SCREEN: NEGATIVE

## 2015-02-12 LAB — ABO/RH: ABO/RH(D): O POS

## 2015-02-12 LAB — GLUCOSE, CAPILLARY: Glucose-Capillary: 78 mg/dL (ref 65–99)

## 2015-02-12 SURGERY — Surgical Case
Anesthesia: Spinal | Site: Abdomen | Laterality: Bilateral

## 2015-02-12 MED ORDER — IBUPROFEN 600 MG PO TABS
600.0000 mg | ORAL_TABLET | Freq: Four times a day (QID) | ORAL | Status: DC
  Administered 2015-02-12 – 2015-02-14 (×6): 600 mg via ORAL
  Filled 2015-02-12 (×6): qty 1

## 2015-02-12 MED ORDER — METHYLERGONOVINE MALEATE 0.2 MG/ML IJ SOLN: 0.2000 mg | INTRAMUSCULAR | Status: DC | PRN

## 2015-02-12 MED ORDER — NALBUPHINE HCL 10 MG/ML IJ SOLN: 2.5000 mg | INTRAMUSCULAR | Status: DC | PRN

## 2015-02-12 MED ORDER — MORPHINE SULFATE (PF) 0.5 MG/ML IJ SOLN
INTRAMUSCULAR | Status: DC | PRN
  Administered 2015-02-12: .1 mg via INTRATHECAL

## 2015-02-12 MED ORDER — WITCH HAZEL-GLYCERIN EX PADS: 1.0000 "application " | MEDICATED_PAD | CUTANEOUS | Status: DC | PRN

## 2015-02-12 MED ORDER — FAMOTIDINE 20 MG PO TABS
10.0000 mg | ORAL_TABLET | Freq: Every day | ORAL | Status: DC
  Administered 2015-02-12 – 2015-02-14 (×3): 10 mg via ORAL
  Filled 2015-02-12 (×3): qty 1

## 2015-02-12 MED ORDER — LACTATED RINGERS IV SOLN
INTRAVENOUS | Status: DC
  Administered 2015-02-12: 23:00:00 via INTRAVENOUS

## 2015-02-12 MED ORDER — LACTATED RINGERS IV SOLN
INTRAVENOUS | Status: DC | PRN
  Administered 2015-02-12 (×3): via INTRAVENOUS

## 2015-02-12 MED ORDER — SIMETHICONE 80 MG PO CHEW
80.0000 mg | CHEWABLE_TABLET | Freq: Three times a day (TID) | ORAL | Status: DC
  Administered 2015-02-12 – 2015-02-14 (×4): 80 mg via ORAL
  Filled 2015-02-12 (×5): qty 1

## 2015-02-12 MED ORDER — METHYLERGONOVINE MALEATE 0.2 MG PO TABS: 0.2000 mg | ORAL_TABLET | ORAL | Status: DC | PRN

## 2015-02-12 MED ORDER — SCOPOLAMINE 1 MG/3DAYS TD PT72
MEDICATED_PATCH | TRANSDERMAL | Status: AC
  Administered 2015-02-12: 1.5 mg via TRANSDERMAL
  Filled 2015-02-12: qty 1

## 2015-02-12 MED ORDER — ZOLPIDEM TARTRATE 5 MG PO TABS
5.0000 mg | ORAL_TABLET | Freq: Every evening | ORAL | Status: DC | PRN
  Administered 2015-02-12 – 2015-02-13 (×2): 5 mg via ORAL
  Filled 2015-02-12 (×2): qty 1

## 2015-02-12 MED ORDER — PHENYLEPHRINE 8 MG IN D5W 100 ML (0.08MG/ML) PREMIX OPTIME
INJECTION | INTRAVENOUS | Status: DC | PRN
  Administered 2015-02-12: 60 ug/min via INTRAVENOUS

## 2015-02-12 MED ORDER — SODIUM CHLORIDE 0.9 % IJ SOLN
INTRAMUSCULAR | Status: DC | PRN
  Administered 2015-02-12: 40 mL

## 2015-02-12 MED ORDER — KETOROLAC TROMETHAMINE 30 MG/ML IJ SOLN
30.0000 mg | Freq: Once | INTRAMUSCULAR | Status: AC
  Administered 2015-02-12: 30 mg via INTRAVENOUS

## 2015-02-12 MED ORDER — FLUOXETINE HCL 20 MG PO CAPS
40.0000 mg | ORAL_CAPSULE | Freq: Every day | ORAL | Status: DC
  Administered 2015-02-13 – 2015-02-14 (×2): 40 mg via ORAL
  Filled 2015-02-12 (×3): qty 2

## 2015-02-12 MED ORDER — OXYTOCIN 10 UNIT/ML IJ SOLN
INTRAMUSCULAR | Status: AC
  Filled 2015-02-12: qty 4

## 2015-02-12 MED ORDER — KETOROLAC TROMETHAMINE 30 MG/ML IJ SOLN
INTRAMUSCULAR | Status: DC | PRN
  Administered 2015-02-12: 30 mg via INTRAVENOUS

## 2015-02-12 MED ORDER — FENTANYL CITRATE (PF) 100 MCG/2ML IJ SOLN
INTRAMUSCULAR | Status: DC | PRN
Start: 2015-02-12 — End: 2015-02-12
  Administered 2015-02-12: 25 ug via INTRATHECAL

## 2015-02-12 MED ORDER — NALBUPHINE HCL 10 MG/ML IJ SOLN
2.5000 mg | INTRAMUSCULAR | Status: DC | PRN
  Administered 2015-02-12 (×2): 2.5 mg via INTRAVENOUS

## 2015-02-12 MED ORDER — NALBUPHINE HCL 10 MG/ML IJ SOLN
INTRAMUSCULAR | Status: AC
  Administered 2015-02-12: 2.5 mg via INTRAVENOUS
  Filled 2015-02-12: qty 1

## 2015-02-12 MED ORDER — LACTATED RINGERS IV SOLN
Freq: Once | INTRAVENOUS | Status: AC
  Administered 2015-02-12: 11:00:00 via INTRAVENOUS

## 2015-02-12 MED ORDER — DIBUCAINE 1 % RE OINT: 1.0000 "application " | TOPICAL_OINTMENT | RECTAL | Status: DC | PRN

## 2015-02-12 MED ORDER — SIMETHICONE 80 MG PO CHEW: 80.0000 mg | CHEWABLE_TABLET | ORAL | Status: DC | PRN

## 2015-02-12 MED ORDER — MORPHINE SULFATE 0.5 MG/ML IJ SOLN
INTRAMUSCULAR | Status: AC
  Filled 2015-02-12: qty 10

## 2015-02-12 MED ORDER — OXYTOCIN 40 UNITS IN LACTATED RINGERS INFUSION - SIMPLE MED: 62.5000 mL/h | INTRAVENOUS | Status: AC

## 2015-02-12 MED ORDER — KETOROLAC TROMETHAMINE 30 MG/ML IJ SOLN
INTRAMUSCULAR | Status: AC
  Filled 2015-02-12: qty 1

## 2015-02-12 MED ORDER — LACTATED RINGERS IV SOLN
40.0000 [IU] | INTRAVENOUS | Status: DC | PRN
  Administered 2015-02-12: 40 [IU] via INTRAVENOUS

## 2015-02-12 MED ORDER — OXYCODONE-ACETAMINOPHEN 5-325 MG PO TABS
2.0000 | ORAL_TABLET | ORAL | Status: DC | PRN
  Administered 2015-02-12 – 2015-02-14 (×5): 2 via ORAL
  Filled 2015-02-12 (×5): qty 2

## 2015-02-12 MED ORDER — BUPIVACAINE LIPOSOME 1.3 % IJ SUSP
INTRAMUSCULAR | Status: DC | PRN
  Administered 2015-02-12: 20 mL

## 2015-02-12 MED ORDER — OXYCODONE-ACETAMINOPHEN 5-325 MG PO TABS
1.0000 | ORAL_TABLET | ORAL | Status: DC | PRN
  Administered 2015-02-13: 1 via ORAL
  Filled 2015-02-12: qty 1

## 2015-02-12 MED ORDER — LANOLIN HYDROUS EX OINT: 1.0000 "application " | TOPICAL_OINTMENT | CUTANEOUS | Status: DC | PRN

## 2015-02-12 MED ORDER — SCOPOLAMINE 1 MG/3DAYS TD PT72
1.0000 | MEDICATED_PATCH | Freq: Once | TRANSDERMAL | Status: DC
  Administered 2015-02-12: 1.5 mg via TRANSDERMAL

## 2015-02-12 MED ORDER — PHENYLEPHRINE 8 MG IN D5W 100 ML (0.08MG/ML) PREMIX OPTIME
INJECTION | INTRAVENOUS | Status: AC
Start: 2015-02-12 — End: 2015-02-12
  Filled 2015-02-12: qty 100

## 2015-02-12 MED ORDER — SENNOSIDES-DOCUSATE SODIUM 8.6-50 MG PO TABS
2.0000 | ORAL_TABLET | ORAL | Status: DC
  Administered 2015-02-12 – 2015-02-14 (×2): 2 via ORAL
  Filled 2015-02-12 (×2): qty 2

## 2015-02-12 MED ORDER — ZOLPIDEM TARTRATE 5 MG PO TABS: 10.0000 mg | ORAL_TABLET | Freq: Every evening | ORAL | Status: DC | PRN

## 2015-02-12 MED ORDER — TETANUS-DIPHTH-ACELL PERTUSSIS 5-2.5-18.5 LF-MCG/0.5 IM SUSP
0.5000 mL | Freq: Once | INTRAMUSCULAR | Status: AC
  Administered 2015-02-14: 0.5 mL via INTRAMUSCULAR
  Filled 2015-02-12: qty 0.5

## 2015-02-12 MED ORDER — PRENATAL MULTIVITAMIN CH
1.0000 | ORAL_TABLET | Freq: Every day | ORAL | Status: DC
  Administered 2015-02-13: 1 via ORAL
  Filled 2015-02-12: qty 1

## 2015-02-12 MED ORDER — ACETAMINOPHEN 325 MG PO TABS
650.0000 mg | ORAL_TABLET | ORAL | Status: DC | PRN
  Administered 2015-02-12: 650 mg via ORAL
  Filled 2015-02-12: qty 2

## 2015-02-12 MED ORDER — ONDANSETRON HCL 4 MG/2ML IJ SOLN
INTRAMUSCULAR | Status: AC
  Filled 2015-02-12: qty 2

## 2015-02-12 MED ORDER — DIPHENHYDRAMINE HCL 25 MG PO CAPS: 25.0000 mg | ORAL_CAPSULE | Freq: Four times a day (QID) | ORAL | Status: DC | PRN

## 2015-02-12 MED ORDER — SIMETHICONE 80 MG PO CHEW
80.0000 mg | CHEWABLE_TABLET | ORAL | Status: DC
  Administered 2015-02-12 – 2015-02-14 (×2): 80 mg via ORAL
  Filled 2015-02-12 (×2): qty 1

## 2015-02-12 MED ORDER — CLONAZEPAM 0.5 MG PO TABS: 0.5000 mg | ORAL_TABLET | Freq: Two times a day (BID) | ORAL | Status: DC | PRN

## 2015-02-12 MED ORDER — BUPIVACAINE LIPOSOME 1.3 % IJ SUSP
20.0000 mL | Freq: Once | INTRAMUSCULAR | Status: DC
  Filled 2015-02-12: qty 20

## 2015-02-12 MED ORDER — MENTHOL 3 MG MT LOZG: 1.0000 | LOZENGE | OROMUCOSAL | Status: DC | PRN

## 2015-02-12 MED ORDER — SODIUM CHLORIDE 0.9 % IJ SOLN
INTRAMUSCULAR | Status: AC
  Filled 2015-02-12: qty 50

## 2015-02-12 MED ORDER — FENTANYL CITRATE (PF) 100 MCG/2ML IJ SOLN
INTRAMUSCULAR | Status: AC
  Filled 2015-02-12: qty 2

## 2015-02-12 MED ORDER — KETOROLAC TROMETHAMINE 30 MG/ML IJ SOLN
INTRAMUSCULAR | Status: AC
  Administered 2015-02-12: 30 mg via INTRAVENOUS
  Filled 2015-02-12: qty 1

## 2015-02-12 MED ORDER — ONDANSETRON HCL 4 MG/2ML IJ SOLN
INTRAMUSCULAR | Status: DC | PRN
  Administered 2015-02-12: 4 mg via INTRAVENOUS

## 2015-02-12 SURGICAL SUPPLY — 35 items
CLAMP CORD UMBIL (MISCELLANEOUS) IMPLANT
CLOTH BEACON ORANGE TIMEOUT ST (SAFETY) ×2 IMPLANT
DRAPE SHEET LG 3/4 BI-LAMINATE (DRAPES) IMPLANT
DRSG OPSITE POSTOP 4X10 (GAUZE/BANDAGES/DRESSINGS) ×2 IMPLANT
DURAPREP 26ML APPLICATOR (WOUND CARE) ×4 IMPLANT
ELECT REM PT RETURN 9FT ADLT (ELECTROSURGICAL) ×2
ELECTRODE REM PT RTRN 9FT ADLT (ELECTROSURGICAL) ×1 IMPLANT
EXTRACTOR VACUUM BELL STYLE (SUCTIONS) IMPLANT
GLOVE BIOGEL PI IND STRL 8 (GLOVE) ×1 IMPLANT
GLOVE BIOGEL PI INDICATOR 8 (GLOVE) ×1
GLOVE ECLIPSE 8.0 STRL XLNG CF (GLOVE) ×2 IMPLANT
GOWN STRL NON-REIN LRG LVL3 (GOWN DISPOSABLE) ×2 IMPLANT
GOWN STRL REUS W/TWL LRG LVL3 (GOWN DISPOSABLE) ×4 IMPLANT
KIT ABG SYR 3ML LUER SLIP (SYRINGE) ×2 IMPLANT
LIQUID BAND (GAUZE/BANDAGES/DRESSINGS) ×2 IMPLANT
NEEDLE HYPO 18GX1.5 BLUNT FILL (NEEDLE) ×2 IMPLANT
NEEDLE HYPO 22GX1.5 SAFETY (NEEDLE) ×2 IMPLANT
NEEDLE HYPO 25X5/8 SAFETYGLIDE (NEEDLE) ×2 IMPLANT
NS IRRIG 1000ML POUR BTL (IV SOLUTION) ×2 IMPLANT
PACK C SECTION WH (CUSTOM PROCEDURE TRAY) ×2 IMPLANT
PAD OB MATERNITY 4.3X12.25 (PERSONAL CARE ITEMS) ×2 IMPLANT
RTRCTR C-SECT PINK 25CM LRG (MISCELLANEOUS) IMPLANT
STAPLER VISISTAT 35W (STAPLE) IMPLANT
SUT CHROMIC 0 CT 1 (SUTURE) ×4 IMPLANT
SUT MNCRL 0 VIOLET CTX 36 (SUTURE) ×3 IMPLANT
SUT MONOCRYL 0 CTX 36 (SUTURE) ×3
SUT PLAIN 2 0 (SUTURE) ×2
SUT PLAIN 2 0 XLH (SUTURE) IMPLANT
SUT PLAIN ABS 2-0 CT1 27XMFL (SUTURE) ×2 IMPLANT
SUT VIC AB 0 CTX 36 (SUTURE) ×1
SUT VIC AB 0 CTX36XBRD ANBCTRL (SUTURE) ×1 IMPLANT
SUT VIC AB 4-0 KS 27 (SUTURE) IMPLANT
SYR 20CC LL (SYRINGE) ×4 IMPLANT
TOWEL OR 17X24 6PK STRL BLUE (TOWEL DISPOSABLE) ×2 IMPLANT
TRAY FOLEY CATH SILVER 14FR (SET/KITS/TRAYS/PACK) IMPLANT

## 2015-02-12 NOTE — H&P (Signed)
Preoperative History and Physical  Lisa Norton is a 44 y.o. G2P1001 with Estimated Date of Delivery: 02/24/15 [redacted]w[redacted]d  admitted for a caesarean delivery of a intrauterine fetal demise, etiology unclear at present.  Class B DM under good control on glyburide 5 BID + lantus 15 units at hs, twice weekly testing has been reassuring throughout with BPP 8/8 last friday Fetal ECHO normal Macrosomia and polyhydramnios due to her Class B DM, was scheduled for Caesarean delivery next week Advanced maternal age with cfDNA studies normal  See yesterday's note below:   Pt presented for her scheduled NST this am(02/11/2015). Stated she felt the baby move between 6-7 am, was definitely moving last night according to her. Unfortunately when she was being hooked up to the external doppler for her NST could not find the heartbeat.  Sonogram was performed to locate FHR and no fetal cardiac activity was noted. I was called in by Chrissie Noa to confirm and indeed did confirm no cardiac activity. Additionally fluid was still good and there was no evidence of retroplacental clot. Pt denied any bleeding or ROM or contractions, really she has felt good and no problems at all. Her BS was a bit high for her last night but she took it early otherwise all of her BS have been outstanding and well controlled  Understandably the patient was very upset and I spent quite a bit of time going over everything with the patient. We discussed timing and mode of delivery in detail. We were proceeding with caesareans section next week due to EFW projected to that time of 5000 grams in a Class B diabetic. She still very much wants to proceed with that mode of delivery. I instructed her the most "right" management would be induction with vaginal delivery but she, despite the increased risks of operative delivery, wants a Caesarean section. She is considering BTL still and that will be a time of surgery decision  As a result I  scheduled Amy for delivery 6/15 as an add on.  See sonogram note          PMH:    Past Medical History  Diagnosis Date  . COLD SORE 05/15/2010  . TOBACCO ABUSE 12/12/2009  . DEPRESSION 12/12/2009  . HYPERTENSION 12/12/2009  . PREDIABETES 02/11/2010  . Diabetes mellitus without complication   . 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 Cholecystectomy  2006  . Tonsillectomy  1988  . Cervical fusion      POb/GynH:      OB History    Gravida Para Term Preterm AB TAB SAB Ectopic Multiple Living   2 1 1  0 0 0 0 0 0 1      SH:   History  Substance Use Topics  . Smoking status: Current Every Day Smoker -- 0.50 packs/day for 20 years    Types: Cigarettes  . Smokeless tobacco: Not on file  . Alcohol Use: No    FH:    Family History  Problem Relation Age of Onset  . Heart disease Mother   . Narcolepsy Mother      Allergies: No Known Allergies  Medications:       Current facility-administered medications:  .  bupivacaine liposome (EXPAREL) 1.3 % injection 266 mg, 20 mL, Infiltration, Once, Florian Buff, MD .  cefoTEtan (CEFOTAN) 2 g in dextrose 5 % 50 mL IVPB, 2 g, Intravenous, On Call to OR,  Florian Buff, MD .  ketorolac (TORADOL) 30 MG/ML injection 30 mg, 30 mg, Intravenous, Once, Florian Buff, MD .  ketorolac (TORADOL) 30 MG/ML injection, , , ,  .  lactated ringers infusion, , Intravenous, Once, Lauretta Grill, MD .  scopolamine (TRANSDERM-SCOP) 1 MG/3DAYS 1.5 mg, 1 patch, Transdermal, Once, Lauretta Grill, MD  Review of Systems:   Review of Systems  Constitutional: Negative for fever, chills, weight loss, malaise/fatigue and diaphoresis.  HENT: Negative for hearing loss, ear pain, nosebleeds, congestion, sore throat, neck pain, tinnitus and ear discharge.   Eyes: Negative for blurred vision, double vision, photophobia, pain, discharge and redness.  Respiratory: Negative for cough, hemoptysis, sputum production,  shortness of breath, wheezing and stridor.   Cardiovascular: Negative for chest pain, palpitations, orthopnea, claudication, leg swelling and PND.  Gastrointestinal: Positive for abdominal pain. Negative for heartburn, nausea, vomiting, diarrhea, constipation, blood in stool and melena.  Genitourinary: Negative for dysuria, urgency, frequency, hematuria and flank pain.  Musculoskeletal: Negative for myalgias, back pain, joint pain and falls.  Skin: Negative for itching and rash.  Neurological: Negative for dizziness, tingling, tremors, sensory change, speech change, focal weakness, seizures, loss of consciousness, weakness and headaches.  Endo/Heme/Allergies: Negative for environmental allergies and polydipsia. Does not bruise/bleed easily.  Psychiatric/Behavioral: Negative for depression, suicidal ideas, hallucinations, memory loss and substance abuse. The patient is not nervous/anxious and does not have insomnia.      PHYSICAL EXAM:  Blood pressure 143/73, pulse 100, temperature 98.4 F (36.9 C), temperature source Oral, resp. rate 20, height 5\' 3"  (1.6 m), weight 226 lb (102.513 kg), SpO2 99 %.    Vitals reviewed. Constitutional: She is oriented to person, place, and time. She appears well-developed and well-nourished.  HENT:  Head: Normocephalic and atraumatic.  Right Ear: External ear normal.  Left Ear: External ear normal.  Nose: Nose normal.  Mouth/Throat: Oropharynx is clear and moist.  Eyes: Conjunctivae and EOM are normal. Pupils are equal, round, and reactive to light. Right eye exhibits no discharge. Left eye exhibits no discharge. No scleral icterus.  Neck: Normal range of motion. Neck supple. No tracheal deviation present. No thyromegaly present.  Cardiovascular: Normal rate, regular rhythm, normal heart sounds and intact distal pulses.  Exam reveals no gallop and no friction rub.   No murmur heard. Respiratory: Effort normal and breath sounds normal. No respiratory  distress. She has no wheezes. She has no rales. She exhibits no tenderness.  GI: Soft. Bowel sounds are normal. She exhibits no distension and no mass. There is tenderness. There is no rebound and no guarding.  Genitourinary:       Vulva is normal without lesions Vagina is pink moist without discharge Cervix normal in appearance and pap is normal Uterus is term size, FH 46 cm Adnexa is negative with normal sized ovaries by sonogram  Musculoskeletal: Normal range of motion. She exhibits no edema and no tenderness.  Neurological: She is alert and oriented to person, place, and time. She has normal reflexes. She displays normal reflexes. No cranial nerve deficit. She exhibits normal muscle tone. Coordination normal.  Skin: Skin is warm and dry. No rash noted. No erythema. No pallor.  Psychiatric: She has a normal mood and affect. Her behavior is normal. Judgment and thought content normal.    Labs: Results for orders placed or performed during the hospital encounter of 02/12/15 (from the past 336 hour(s))  CBC   Collection Time: 02/12/15  9:52 AM  Result Value Ref Range  WBC 18.6 (H) 4.0 - 10.5 K/uL   RBC 4.15 3.87 - 5.11 MIL/uL   Hemoglobin 14.2 12.0 - 15.0 g/dL   HCT 39.9 36.0 - 46.0 %   MCV 96.1 78.0 - 100.0 fL   MCH 34.2 (H) 26.0 - 34.0 pg   MCHC 35.6 30.0 - 36.0 g/dL   RDW 14.2 11.5 - 15.5 %   Platelets 187 150 - 400 K/uL  Results for orders placed or performed in visit on 02/11/15 (from the past 336 hour(s))  POCT urinalysis dipstick   Collection Time: 02/11/15 11:12 AM  Result Value Ref Range   Color, UA     Clarity, UA     Glucose, UA neg    Bilirubin, UA     Ketones, UA neg    Spec Grav, UA     Blood, UA neg    pH, UA     Protein, UA neg    Urobilinogen, UA     Nitrite, UA neg    Leukocytes, UA Negative Negative  Results for orders placed or performed in visit on 02/07/15 (from the past 336 hour(s))  POCT urinalysis dipstick   Collection Time: 02/07/15 11:39 AM   Result Value Ref Range   Color, UA     Clarity, UA     Glucose, UA 2    Bilirubin, UA     Ketones, UA neg    Spec Grav, UA     Blood, UA trace    pH, UA     Protein, UA trace    Urobilinogen, UA     Nitrite, UA neg    Leukocytes, UA Negative   Results for orders placed or performed in visit on 02/07/15 (from the past 336 hour(s))  US OB Follow Up   Collection Time: 02/07/15 12:03 PM  Result Value Ref Range   Biparietal Diameter  cm   Abdominal Circumference  cm   Femoral Diameter  cm   Head Circumference  cm   HC/AC     Estimated Fetal Weight  grams  Results for orders placed or performed in visit on 02/03/15 (from the past 336 hour(s))  POCT urinalysis dipstick   Collection Time: 02/03/15  9:36 AM  Result Value Ref Range   Color, UA     Clarity, UA     Glucose, UA neg    Bilirubin, UA     Ketones, UA neg    Spec Grav, UA     Blood, UA neg    pH, UA     Protein, UA neg    Urobilinogen, UA     Nitrite, UA neg    Leukocytes, UA Negative   Results for orders placed or performed in visit on 01/31/15 (from the past 336 hour(s))  Culture, beta strep (group b only)   Collection Time: 01/31/15 10:00 AM  Result Value Ref Range   Strep Gp B Culture Negative Negative  GC/Chlamydia Probe Amp   Collection Time: 01/31/15 10:00 AM  Result Value Ref Range   Chlamydia trachomatis, NAA Negative Negative   Neisseria gonorrhoeae by PCR Negative Negative   PLEASE NOTE: Comment   POCT urinalysis dipstick   Collection Time: 01/31/15 10:34 AM  Result Value Ref Range   Color, UA     Clarity, UA     Glucose, UA neg    Bilirubin, UA     Ketones, UA neg    Spec Grav, UA     Blood, UA neg    pH,  UA     Protein, UA trace    Urobilinogen, UA     Nitrite, UA neg    Leukocytes, UA Negative     EKG: Orders placed or performed during the hospital encounter of 01/14/14  . EKG 12-Lead  . EKG 12-Lead    Imaging Studies: US Ob Limited  02/11/2015   FOLLOW UP SONOGRAM   CLAY MENSER is in the office for a follow up sonogram to check for fht.  She is a 44 y.o. year old G2P1001 with Estimated Date of Delivery: 02/24/15  by early ultrasound now at  [redacted]w[redacted]d weeks gestation. Thus far the pregnancy  has been complicated by AMA,DM,and poly..  GESTATION: SINGLETON  PRESENTATION: cephalic  FETAL ACTIVITY:          Heart rate         No cardiac activity          The fetus is inactive.  AMNIOTIC FLUID: The amniotic fluid volume is  abnormal - SDP of fluid 9cm.polyhydramnios  PLACENTA LOCALIZATION:  anterior GRADE 3  CERVIX: Limited view  GESTATIONAL AGE AND  BIOMETRICS:  Gestational criteria: Estimated Date of Delivery: 02/24/15 by early  ultrasound now at [redacted]w[redacted]d  Previous Scans:8   SUSPECTED ABNORMALITIES:  yes  QUALITY OF SCAN: limited  TECHNICIAN COMMENTS:  [place] 38+1WK, NO cardiac activity seen,ant pl gr 3, sdp of fluid 9cm,pt was  scanned by Dr.Lucile Didonato to confirm no fht.        A copy of this report including all images has been saved and backed up to  a second source for retrieval if needed. All measures and details of the  anatomical scan, placentation, fluid volume and pelvic anatomy are  contained in that report.  Silver Huguenin 02/11/2015 12:09 PM      US Ob Follow Up  02/11/2015   FOLLOW UP SONOGRAM   [name] is in the office for a follow up sonogram for BPP,EFW,cord  dopplers.  She is a 44 y.o. year old G2P1001 with Estimated Date of Delivery: 02/24/15  by early ultrasound now at  [redacted]w[redacted]d weeks gestation. Thus far the pregnancy  has been complicated by AMA,DM,poly,biochemical screening (1:43 DSR low  risk NIPS).  GESTATION: SINGLETON  PRESENTATION: cephalic  FETAL ACTIVITY:          Heart rate         148bpm          The fetus is active.  AMNIOTIC FLUID: The amniotic fluid volume is  abnormal - POLYHYDRAMNIOUS, 27 cm.  PLACENTA LOCALIZATION:  anterior GRADE 3  CERVIX: Unable to see  ADNEXA:  wnl GESTATIONAL AGE AND  BIOMETRICS:  Gestational criteria: Estimated Date of Delivery: 02/24/15 by early   ultrasound now at [redacted]w[redacted]d  Previous Scans:7  BIOPHYSCIAL PROFILE:                                                                                                       [place]  2   TONE                2   RESPIRATIONS                2   AMNIOTIC FLUID                2                                                            SCORE:  8/8 (Note: NST was not performed as part of this antepartum testing)  DOPPLER FLOW STUDIES: UMBILICAL ARTERY RI RATIOS:   0.76, 0.76               BIPARIETAL DIAMETER           9.44 cm         38+3 weeks  HEAD CIRCUMFERENCE           33.20 cm         37+6 weeks  ABDOMINAL CIRCUMFERENCE           40.01 cm         97.7%  Out of range  FEMUR LENGTH           7.32 cm         37+3 weeks                                                           AVERAGE EGA(BY THIS SCAN):   38 weeks                                                 ESTIMATED FETAL WEIGHT:        4614  grams, 90 % ANATOMICAL SURVEY                                                                             COMMENTS CEREBRAL VENTRICLES     CHOROID PLEXUS     CEREBELLUM     CISTERNA MAGNA     NUCHAL REGION     ORBITS     NASAL BONE     NOSE/LIP     FACIAL PROFILE     4 CHAMBERED HEART     OUTFLOW TRACTS     DIAPHRAGM yes normal   STOMACH yes normal   RENAL REGION yes normal Rt .6cm lt .4cm dilated renal pelvis wnl    BLADDER yes normal   CORD INSERTION     3 VESSEL CORD     SPINE     ARMS/HANDS     LEGS/FEET     GENITALIA  SUSPECTED ABNORMALITIES:  yes,polyhydramnios,elevated RI,AC measures 97.7%  QUALITY OF SCAN: satisfactory  TECHNICIAN COMMENTS:  [place] 37+4WKS,EFW 4614G (10LBS3OZ) 90%,AC 97.7%,BPD 87.9%,HC 33.8%,FL 48.2%,  polyhydramnios 27cm,RI .76,bilat adnexa wnl,cephalic,fht 474QVZ,DGL pl gr  3,BPP 8/8       A copy of this report including all images has been saved and backed up to  a second source for retrieval if needed. All measures and details of the  anatomical scan,  placentation, fluid volume and pelvic anatomy are  contained in that report.  Amber Heide Guile 02/07/2015 2:31 PM  Clinical Impression and recommendations:  I have reviewed the sonogram results above, combined with the patient's  current clinical course, below are my impressions and any appropriate  recommendations for management based on the sonographic findings.   1.  G2P1001 Estimated Date of Delivery: 02/24/15 by serial sonographic  evaluations   2.  Fetal sonographic surveillance findings:   a). Polyhydramnios which has been consistent since 32 weeks, due to  maternal diabetes b). Normal antepartum fetal assessment with BPP 8/8  c). Normal fetal Doppler ratios with consistent diastolic flow, RI noted  but still <90percentile(no IUGR associated or hypertensive disorder) d). Fetal macrosomia which has been consistent since 32 weeks  3.  Normal general sonographic findings  Recommend continued prenatal evaluations and care based on this sonogram  and as clinically indicated from the patient's clinical course.  Rhonda Linan H      US Ob Follow Up  01/16/2015   FOLLOW UP SONOGRAM   CHARNICE ZWILLING is in the office for a follow up sonogram for  BPP,EFW,doppler.  She is a 44 y.o. year old G2P1001 with Estimated Date of Delivery: 02/24/15  by early ultrasound now at  [redacted]w[redacted]d weeks gestation. Thus far the pregnancy  has been complicated by AMA,DM,Abn biochemical screening.Marland Kitchen  GESTATION: SINGLETON  PRESENTATION: cephalic  FETAL ACTIVITY:          Heart rate         127          The fetus is active.  AMNIOTIC FLUID: The amniotic fluid volume is  abnormal - polyhydramnios, 29 cm.  PLACENTA LOCALIZATION:  anterior GRADE 2  CERVIX: Unable to see  GESTATIONAL AGE AND  BIOMETRICS:  Gestational criteria: Estimated Date of Delivery: 02/24/15 by early  ultrasound now at [redacted]w[redacted]d  Previous Scans:6  BIOPHYSCIAL PROFILE:                                                                                                       COMMENTS GROSS BODY  MOVEMENT                 2   TONE                2   RESPIRATIONS                2   AMNIOTIC FLUID                2  SCORE:  8/8 (Note: NST was not performed as part of this antepartum testing)   DOPPLER FLOW STUDIES: UMBILICAL ARTERY RI RATIOS:   0.68, 0.71,.73               BIPARIETAL DIAMETER           9.06 cm         36+5 weeks  HEAD CIRCUMFERENCE           32.48 cm         36+5 weeks  ABDOMINAL CIRCUMFERENCE           36.28 cm         40+1 weeks  FEMUR LENGTH           7.04 cm         36+1 weeks                                                           AVERAGE EGA(BY THIS SCAN):   37 weeks                                                 ESTIMATED FETAL WEIGHT:        3653  grams, 90 % ANATOMICAL SURVEY                                                                             COMMENTS CEREBRAL VENTRICLES     CHOROID PLEXUS     CEREBELLUM     CISTERNA MAGNA     NUCHAL REGION     ORBITS     NASAL BONE     NOSE/LIP     FACIAL PROFILE     4 CHAMBERED HEART     OUTFLOW TRACTS     DIAPHRAGM yes normal   STOMACH yes normal   RENAL REGION yes abnormal     Dilated renal pelvis  Rt .98,lt .63cm  BLADDER yes normal   CORD INSERTION     3 VESSEL CORD     SPINE     ARMS/HANDS     LEGS/FEET     GENITALIA           SUSPECTED ABNORMALITIES:  yes   QUALITY OF SCAN:  TECHNICIAN COMMENTS:   [place] 34+3d, large for dates, efw 3653g 90%,polyhydramnios afi 29cm, dilated  renal pelvis rt kid .98cm,lt kid .63cm,ant pl gr 2,BPP 8/8,RI .68,.71,.73   S/D 3.43 97% BPP 8/8,HC 74%,AC> 97%,FL 82%  A copy of this report including all images has been saved and backed up to  a second source for retrieval if needed. All measures and details of the  anatomical scan, placentation, fluid volume and pelvic anatomy are  contained in that report.  Amber Heide Guile 01/16/2015 1:23 PM  Clinical Impression and recommendations:  I have reviewed the sonogram results above, combined with the patient's  current clinical course, below are my impressions and any appropriate  recommendations for management based on the sonographic findings.   1.  G2P1001 Estimated Date of Delivery: 02/24/15 by serial sonographic  evaluations   2.  Fetal sonographic surveillance findings:   a). Polyhydramnios secondary to Class B DM b). Normal antepartum fetal assessment with BPP 8/8  c). Mildly elevated(statistically) fetal Doppler ratios with consistent  diastolic flow  d). Macrosomic fetal growth  Mild prominence of renal pelvis  3.  Normal general sonographic finding  Recommend continued prenatal evaluations and care based on this sonogram  and as clinically indicated from the patient's clinical course.  EWith today's findings will switch to weekly Dopplers with surveillance  and alt NST  Briani Maul H 01/16/2015 2:17 PM       US Fetal Bpp W/o Non Stress  02/11/2015   FOLLOW UP SONOGRAM   MELLONIE GUESS is in the office for a follow up sonogram for BPP,EFW,cord  dopplers.  She is a 44 y.o. year old G2P1001 with Estimated Date of Delivery: 02/24/15  by early ultrasound now at  [redacted]w[redacted]d weeks gestation. Thus far the pregnancy  has been complicated by AMA,DM,poly,biochemical screening (1:43 DSR low  risk NIPS).  GESTATION: SINGLETON  PRESENTATION: cephalic  FETAL ACTIVITY:          Heart rate         148bpm          The fetus is active.  AMNIOTIC FLUID: The amniotic fluid volume is  abnormal - POLYHYDRAMNIOUS, 27 cm.  PLACENTA LOCALIZATION:  anterior GRADE 3  CERVIX: Unable to see  ADNEXA:  wnl GESTATIONAL AGE AND  BIOMETRICS:  Gestational criteria: Estimated Date of Delivery: 02/24/15 by early  ultrasound now at [redacted]w[redacted]d  Previous Scans:7  BIOPHYSCIAL PROFILE:                                                                                                       COMMENTS GROSS BODY MOVEMENT                 2   TONE                2   RESPIRATIONS                2   AMNIOTIC FLUID                2                                                             SCORE:  8/8 (Note: NST was not performed as part of this antepartum testing)  DOPPLER FLOW STUDIES: UMBILICAL ARTERY RI RATIOS:   0.76, 0.76               BIPARIETAL DIAMETER  9.44 cm         38+3 weeks  HEAD CIRCUMFERENCE           33.20 cm         37+6 weeks  ABDOMINAL CIRCUMFERENCE           40.01 cm         97.7%  Out of range  FEMUR LENGTH           7.32 cm         37+3 weeks                                                           AVERAGE EGA(BY THIS SCAN):   38 weeks                                                 ESTIMATED FETAL WEIGHT:        4614  grams, 90 % ANATOMICAL SURVEY                                                                             COMMENTS CEREBRAL VENTRICLES     CHOROID PLEXUS     CEREBELLUM     CISTERNA MAGNA     NUCHAL REGION     ORBITS     NASAL BONE     NOSE/LIP     FACIAL PROFILE     4 CHAMBERED HEART     OUTFLOW TRACTS     DIAPHRAGM yes normal   STOMACH yes normal   RENAL REGION yes normal Rt .6cm lt .4cm dilated renal pelvis wnl    BLADDER yes normal   CORD INSERTION     3 VESSEL CORD     SPINE     ARMS/HANDS     LEGS/FEET     GENITALIA           SUSPECTED ABNORMALITIES:  yes,polyhydramnios,elevated RI,AC measures 97.7%  QUALITY OF SCAN: satisfactory  TECHNICIAN COMMENTS:  [place] 37+4WKS,EFW 4614G (10LBS3OZ) 90%,AC 97.7%,BPD 87.9%,HC 33.8%,FL 48.2%,  polyhydramnios 27cm,RI .76,bilat adnexa wnl,cephalic,fht 425ZDG,LOV pl gr  3,BPP 8/8       A copy of this report including all images has been saved and backed up to  a second source for retrieval if needed. All measures and details of the  anatomical scan, placentation, fluid volume and pelvic anatomy are  contained in that report.  Amber Heide Guile 02/07/2015 2:31 PM  Clinical Impression and recommendations:  I have reviewed the sonogram results above, combined with the patient's  current clinical course, below are my impressions and any appropriate  recommendations for management based on the sonographic findings.    1.  G2P1001 Estimated Date of Delivery: 02/24/15 by serial sonographic  evaluations   2.  Fetal sonographic surveillance findings:   a). Polyhydramnios which has been consistent since 32 weeks, due to  maternal diabetes b). Normal antepartum fetal assessment with BPP 8/8  c). Normal fetal Doppler ratios with consistent diastolic flow, RI noted  but still <90percentile(no IUGR associated or hypertensive disorder) d). Fetal macrosomia which has been consistent since 32 weeks  3.  Normal general sonographic findings  Recommend continued prenatal evaluations and care based on this sonogram  and as clinically indicated from the patient's clinical course.  Kalya Troeger H      US Fetal Bpp W/o Non Stress  01/16/2015   FOLLOW UP SONOGRAM   PRITIKA ALVAREZ is in the office for a follow up sonogram for  BPP,EFW,doppler.  She is a 44 y.o. year old G2P1001 with Estimated Date of Delivery: 02/24/15  by early ultrasound now at  [redacted]w[redacted]d weeks gestation. Thus far the pregnancy  has been complicated by AMA,DM,Abn biochemical screening.Marland Kitchen  GESTATION: SINGLETON  PRESENTATION: cephalic  FETAL ACTIVITY:          Heart rate         127          The fetus is active.  AMNIOTIC FLUID: The amniotic fluid volume is  abnormal - polyhydramnios, 29 cm.  PLACENTA LOCALIZATION:  anterior GRADE 2  CERVIX: Unable to see  GESTATIONAL AGE AND  BIOMETRICS:  Gestational criteria: Estimated Date of Delivery: 02/24/15 by early  ultrasound now at [redacted]w[redacted]d  Previous Scans:6  BIOPHYSCIAL PROFILE:                                                                                                       COMMENTS GROSS BODY MOVEMENT                 2   TONE                2   RESPIRATIONS                2   AMNIOTIC FLUID                2                                                            SCORE:  8/8 (Note: NST was not performed as part of this antepartum testing)   DOPPLER FLOW STUDIES: UMBILICAL ARTERY RI RATIOS:   0.68, 0.71,.73               BIPARIETAL DIAMETER            9.06 cm         36+5 weeks  HEAD CIRCUMFERENCE           32.48 cm         36+5 weeks  ABDOMINAL CIRCUMFERENCE           36.28 cm         40+1 weeks  FEMUR LENGTH  7.04 cm         36+1 weeks                                                           AVERAGE EGA(BY THIS SCAN):   37 weeks                                                 ESTIMATED FETAL WEIGHT:        3653  grams, 90 % ANATOMICAL SURVEY                                                                             COMMENTS CEREBRAL VENTRICLES     CHOROID PLEXUS     CEREBELLUM     CISTERNA MAGNA     NUCHAL REGION     ORBITS     NASAL BONE     NOSE/LIP     FACIAL PROFILE     4 CHAMBERED HEART     OUTFLOW TRACTS     DIAPHRAGM yes normal   STOMACH yes normal   RENAL REGION yes abnormal     Dilated renal pelvis  Rt .98,lt .63cm  BLADDER yes normal   CORD INSERTION     3 VESSEL CORD     SPINE     ARMS/HANDS     LEGS/FEET     GENITALIA           SUSPECTED ABNORMALITIES:  yes   QUALITY OF SCAN:  TECHNICIAN COMMENTS:   [place] 34+3d, large for dates, efw 3653g 90%,polyhydramnios afi 29cm, dilated  renal pelvis rt kid .98cm,lt kid .63cm,ant pl gr 2,BPP 8/8,RI .68,.71,.73   S/D 3.43 97% BPP 8/8,HC 74%,AC> 97%,FL 82%  A copy of this report including all images has been saved and backed up to  a second source for retrieval if needed. All measures and details of the  anatomical scan, placentation, fluid volume and pelvic anatomy are  contained in that report.  Amber Heide Guile 01/16/2015 1:23 PM  Clinical Impression and recommendations:  I have reviewed the sonogram results above, combined with the patient's  current clinical course, below are my impressions and any appropriate  recommendations for management based on the sonographic findings.   1.  G2P1001 Estimated Date of Delivery: 02/24/15 by serial sonographic  evaluations   2.  Fetal sonographic surveillance findings:   a). Polyhydramnios secondary to Class B DM b). Normal antepartum fetal assessment with  BPP 8/8  c). Mildly elevated(statistically) fetal Doppler ratios with consistent  diastolic flow  d). Macrosomic fetal growth  Mild prominence of renal pelvis  3.  Normal general sonographic finding  Recommend continued prenatal evaluations and care based on this sonogram  and as clinically indicated from the patient's clinical course.  EWith today's findings will switch to weekly Dopplers with surveillance  and alt NST  Trystian Crisanto  H 01/16/2015 2:17 PM       [place] Ua Cord Doppler  02/11/2015   FOLLOW UP SONOGRAM   KAMALI SAKATA is in the office for a follow up sonogram for BPP,EFW,cord  dopplers.  She is a 44 y.o. year old G2P1001 with Estimated Date of Delivery: 02/24/15  by early ultrasound now at  [redacted]w[redacted]d weeks gestation. Thus far the pregnancy  has been complicated by AMA,DM,poly,biochemical screening (1:43 DSR low  risk NIPS).  GESTATION: SINGLETON  PRESENTATION: cephalic  FETAL ACTIVITY:          Heart rate         148bpm          The fetus is active.  AMNIOTIC FLUID: The amniotic fluid volume is  abnormal - POLYHYDRAMNIOUS, 27 cm.  PLACENTA LOCALIZATION:  anterior GRADE 3  CERVIX: Unable to see  ADNEXA:  wnl GESTATIONAL AGE AND  BIOMETRICS:  Gestational criteria: Estimated Date of Delivery: 02/24/15 by early  ultrasound now at [redacted]w[redacted]d  Previous Scans:7  BIOPHYSCIAL PROFILE:                                                                                                       COMMENTS GROSS BODY MOVEMENT                 2   TONE                2   RESPIRATIONS                2   AMNIOTIC FLUID                2                                                            SCORE:  8/8 (Note: NST was not performed as part of this antepartum testing)  DOPPLER FLOW STUDIES: UMBILICAL ARTERY RI RATIOS:   0.76, 0.76               BIPARIETAL DIAMETER           9.44 cm         38+3 weeks  HEAD CIRCUMFERENCE           33.20 cm         37+6 weeks  ABDOMINAL CIRCUMFERENCE           40.01 cm         97.7%  Out of range  FEMUR  LENGTH           7.32 cm         37+3 weeks  AVERAGE EGA(BY THIS SCAN):   38 weeks                                                 ESTIMATED FETAL WEIGHT:        4614  grams, 90 % ANATOMICAL SURVEY                                                                             COMMENTS CEREBRAL VENTRICLES     CHOROID PLEXUS     CEREBELLUM     CISTERNA MAGNA     NUCHAL REGION     ORBITS     NASAL BONE     NOSE/LIP     FACIAL PROFILE     4 CHAMBERED HEART     OUTFLOW TRACTS     DIAPHRAGM yes normal   STOMACH yes normal   RENAL REGION yes normal Rt .6cm lt .4cm dilated renal pelvis wnl    BLADDER yes normal   CORD INSERTION     3 VESSEL CORD     SPINE     ARMS/HANDS     LEGS/FEET     GENITALIA           SUSPECTED ABNORMALITIES:  yes,polyhydramnios,elevated RI,AC measures 97.7%  QUALITY OF SCAN: satisfactory  TECHNICIAN COMMENTS:  [place] 37+4WKS,EFW 4614G (10LBS3OZ) 90%,AC 97.7%,BPD 87.9%,HC 33.8%,FL 48.2%,  polyhydramnios 27cm,RI .76,bilat adnexa wnl,cephalic,fht 712WPY,KDX pl gr  3,BPP 8/8       A copy of this report including all images has been saved and backed up to  a second source for retrieval if needed. All measures and details of the  anatomical scan, placentation, fluid volume and pelvic anatomy are  contained in that report.  Amber Heide Guile 02/07/2015 2:31 PM  Clinical Impression and recommendations:  I have reviewed the sonogram results above, combined with the patient's  current clinical course, below are my impressions and any appropriate  recommendations for management based on the sonographic findings.   1.  G2P1001 Estimated Date of Delivery: 02/24/15 by serial sonographic  evaluations   2.  Fetal sonographic surveillance findings:   a). Polyhydramnios which has been consistent since 32 weeks, due to  maternal diabetes b). Normal antepartum fetal assessment with BPP 8/8  c). Normal fetal Doppler ratios with consistent diastolic flow, RI noted  but still  <90percentile(no IUGR associated or hypertensive disorder) d). Fetal macrosomia which has been consistent since 32 weeks  3.  Normal general sonographic findings  Recommend continued prenatal evaluations and care based on this sonogram  and as clinically indicated from the patient's clinical course.  Senta Kantor H      [place] Ua Cord Doppler  01/16/2015   FOLLOW UP SONOGRAM   KOREE STAHELI is in the office for a follow up sonogram for  BPP,EFW,doppler.  She is a 44 y.o. year old G2P1001 with Estimated Date of Delivery: 02/24/15  by early ultrasound now at  [redacted]w[redacted]d weeks gestation. Thus far the pregnancy  has been complicated by AMA,DM,Abn biochemical screening.Marland Kitchen  GESTATION: SINGLETON  PRESENTATION: cephalic  FETAL ACTIVITY:          Heart rate         127          The fetus is active.  AMNIOTIC FLUID: The amniotic fluid volume is  abnormal - polyhydramnios, 29 cm.  PLACENTA LOCALIZATION:  anterior GRADE 2  CERVIX: Unable to see  GESTATIONAL AGE AND  BIOMETRICS:  Gestational criteria: Estimated Date of Delivery: 02/24/15 by early  ultrasound now at [redacted]w[redacted]d  Previous Scans:6  BIOPHYSCIAL PROFILE:                                                                                                       COMMENTS GROSS BODY MOVEMENT                 2   TONE                2   RESPIRATIONS                2   AMNIOTIC FLUID                2                                                            SCORE:  8/8 (Note: NST was not performed as part of this antepartum testing)   DOPPLER FLOW STUDIES: UMBILICAL ARTERY RI RATIOS:   0.68, 0.71,.73               BIPARIETAL DIAMETER           9.06 cm         36+5 weeks  HEAD CIRCUMFERENCE           32.48 cm         36+5 weeks  ABDOMINAL CIRCUMFERENCE           36.28 cm         40+1 weeks  FEMUR LENGTH           7.04 cm         36+1 weeks                                                           AVERAGE EGA(BY THIS SCAN):   37 weeks                                                 ESTIMATED  FETAL WEIGHT:        3653  grams, 90 % ANATOMICAL SURVEY  COMMENTS CEREBRAL VENTRICLES     CHOROID PLEXUS     CEREBELLUM     CISTERNA MAGNA     NUCHAL REGION     ORBITS     NASAL BONE     NOSE/LIP     FACIAL PROFILE     4 CHAMBERED HEART     OUTFLOW TRACTS     DIAPHRAGM yes normal   STOMACH yes normal   RENAL REGION yes abnormal     Dilated renal pelvis  Rt .98,lt .63cm  BLADDER yes normal   CORD INSERTION     3 VESSEL CORD     SPINE     ARMS/HANDS     LEGS/FEET     GENITALIA           SUSPECTED ABNORMALITIES:  yes   QUALITY OF SCAN:  TECHNICIAN COMMENTS:   [place] 34+3d, large for dates, efw 3653g 90%,polyhydramnios afi 29cm, dilated  renal pelvis rt kid .98cm,lt kid .63cm,ant pl gr 2,BPP 8/8,RI .68,.71,.73   S/D 3.43 97% BPP 8/8,HC 74%,AC> 97%,FL 82%  A copy of this report including all images has been saved and backed up to  a second source for retrieval if needed. All measures and details of the  anatomical scan, placentation, fluid volume and pelvic anatomy are  contained in that report.  Amber Heide Guile 01/16/2015 1:23 PM  Clinical Impression and recommendations:  I have reviewed the sonogram results above, combined with the patient's  current clinical course, below are my impressions and any appropriate  recommendations for management based on the sonographic findings.   1.  G2P1001 Estimated Date of Delivery: 02/24/15 by serial sonographic  evaluations   2.  Fetal sonographic surveillance findings:   a). Polyhydramnios secondary to Class B DM b). Normal antepartum fetal assessment with BPP 8/8  c). Mildly elevated(statistically) fetal Doppler ratios with consistent  diastolic flow  d). Macrosomic fetal growth  Mild prominence of renal pelvis  3.  Normal general sonographic finding  Recommend continued prenatal evaluations and care based on this sonogram  and as clinically indicated from the patient's clinical course.  EWith today's findings  will switch to weekly Dopplers with surveillance  and alt NST  Jenia Klepper H 01/16/2015 2:17 PM          Assessment: IUFD [redacted]w[redacted]d Estimated Date of Delivery: 02/24/15  Class B DM. Good control AMA, normal cfDNA   Patient Active Problem List   Diagnosis Date Noted  . Gestational diabetes mellitus, class B1 01/13/2015  . Polyhydramnios in third trimester 01/13/2015  . Macrosomia 01/13/2015  . 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 Polyhydramnios in third trimester, antepartum 12/04/2014  . Abnormal antenatal AFP screen 09/24/2014  . Obesity 08/13/2014  . Supervision of other high-risk pregnancy 08/12/2014  . AMA (advanced maternal age) multigravida 35+ 08/12/2014  . Maternal pregestational diabetes classes B through R, antepartum 08/12/2014  . History of hypertension 08/12/2014  . C7 radiculopathy 02/11/2014  . Dyslipidemia 01/04/2011  . Type 2 diabetes mellitus 01/04/2011  . TOBACCO ABUSE 12/12/2009  . DEPRESSION 12/12/2009  . HYPERTENSION 12/12/2009    Plan: [place] delivery.  i talked extensively with patient yesterday regarding mode of delivery.  I instructed her the "most right" approach would be cervical ripening and labor induction anticipating a vaginal delivery.  The patient, being mindful of previous discussion regarding delivery mode last week, definitively wants a Caesarean delivery being aware of the increased risk to her, infection, organ damage transfusion etc  She has decided not  to do a tubal ligation   Glorious Flicker H 02/12/2015 11:02 AM

## 2015-02-12 NOTE — Anesthesia Procedure Notes (Signed)
Spinal Patient location during procedure: OR Preanesthetic Checklist Completed: patient identified, site marked, surgical consent, pre-op evaluation, timeout performed, IV checked, risks and benefits discussed and monitors and equipment checked Spinal Block Patient position: sitting Prep: DuraPrep Patient monitoring: heart rate, cardiac monitor, continuous pulse ox and blood pressure Approach: midline Location: L3-4 Injection technique: single-shot Needle Needle type: Sprotte  Needle gauge: 24 G Needle length: 9 cm Assessment Sensory level: T4 Additional Notes Spinal Dosage in OR  Bupivicaine ml       1.3 PFMS04   mcg        100 Fentanyl mcg            25

## 2015-02-12 NOTE — Anesthesia Postprocedure Evaluation (Signed)
  Anesthesia Post-op Note  Patient: Lisa Norton  Procedure(s) Performed: Procedure(s) with comments: CESAREAN SECTION (Bilateral) - Fetal Demise  Patient is awake, responsive, moving her legs, and has signs of resolution of her numbness. Pain and nausea are reasonably well controlled. Vital signs are stable and clinically acceptable. Oxygen saturation is clinically acceptable. There are no apparent anesthetic complications at this time. Patient is ready for discharge.

## 2015-02-12 NOTE — Op Note (Signed)
Preoperative diagnosis:  1.  Intrauterine pregnancy at [redacted]w[redacted]d  weeks gestation                                         2.  Intrauterine Fetal Demise, unknown etiology                                         3.  Class B DM                                         4.  Fetal macrosomia with polyhydramnios   Postoperative diagnosis:  Same as above plus thrombus of umbilical cord in the first 1 foot or so of cord(unknown if a pre- or post mortem event)  Procedure:  Primary cesarean section  Surgeon:  Florian Buff MD  Assistant:    Anesthesia: Spinal  Findings:  .    Over a low transverse incision was delivered a non viable female with Apgars of 0 and 0 weighing 10 lbs. 3 oz. There was evidence of abruption, there was, as known pre op copious amount of light meconium stained fluid, no evidence of intra amniotic infection.  There was obvious thrombus of the first 7-67 inches of umbilical cord, although it could have resulted as a post mortem event. There was no nuchal cord or any other observation of cord compression or wrapping. The fetus had some minor skin peeling, consistent with demise of known duration.   Uterus, tubes and ovaries were all normal.  There were no other significant findings  Description of operation:  Patient was taken to the operating room and placed in the sitting position where she underwent a spinal anesthetic. She was then placed in the supine position with tilt to the left side. When adequate anesthetic level was obtained she was prepped and draped in usual sterile fashion and a Foley catheter was placed. A Pfannenstiel skin incision was made and carried down sharply to the rectus fascia which was scored in the midline extended laterally. The fascia was taken off the muscles both superiorly and without difficulty. The muscles were divided.  The peritoneal cavity was entered.  Bladder blade was placed, no bladder flap was created.  A low transverse hysterotomy incision was  made and delivered a non viable female  infant at 35 with Apgars of 0 and 0 weighing10 lbs 3 oz.   The uterus was exteriorized. It was closed in 2 layers, the first being a running interlocking layer and the second being an imbricating layer using 0 monocryl on a CTX needle. There was good resulting hemostasis. The uterus tubes and ovaries were all normal. Peritoneal cavity was irrigated vigorously. The muscles and peritoneum were reapproximated loosely. The fascia was closed using 0 Vicryl in running fashion. Subcutaneous tissue was made hemostatic and irrigated. The skin was closed using 4-0 Vicryl on a Keith needle in a subcuticular fashion.  liquiban was placed for additional wound integrity and to serve as a barrier. Blood loss for the procedure was 650 cc. The patient received cefotan prophylactically. The patient was taken to the recovery room in good stable condition with all counts being correct x3.  EBL 650 cc  Florian Buff 02/12/2015 12:53 PM

## 2015-02-12 NOTE — Anesthesia Preprocedure Evaluation (Addendum)
Anesthesia Evaluation  Patient identified by MRN, date of birth, ID band Patient awake    Reviewed: Allergy & Precautions, H&P , Patient's Chart, lab work & pertinent test results  Airway Mallampati: III  TM Distance: >3 FB Neck ROM: full   Comment: Poor dentition Dental no notable dental hx.    Pulmonary Current Smoker,  breath sounds clear to auscultation  Pulmonary exam normal       Cardiovascular Exercise Tolerance: Good hypertension, Rhythm:regular Rate:Normal     Neuro/Psych    GI/Hepatic   Endo/Other  diabetes, Type 2Morbid obesity  Renal/GU      Musculoskeletal   Abdominal   Peds  Hematology   Anesthesia Other Findings   Reproductive/Obstetrics                            Anesthesia Physical Anesthesia Plan  ASA: III  Anesthesia Plan: Spinal   Post-op Pain Management:    Induction:   Airway Management Planned:   Additional Equipment:   Intra-op Plan:   Post-operative Plan:   Informed Consent: I have reviewed the patients History and Physical, chart, labs and discussed the procedure including the risks, benefits and alternatives for the proposed anesthesia with the patient or authorized representative who has indicated his/her understanding and acceptance.   Dental Advisory Given  Plan Discussed with: CRNA  Anesthesia Plan Comments: (Lab work confirmed with CRNA in room. Platelets okay. Discussed spinal anesthetic, and patient consents to the procedure:  included risk of possible headache,backache, failed block, allergic reaction, and nerve injury. This patient was asked if she had any questions or concerns before the procedure started. )        Anesthesia Quick Evaluation

## 2015-02-12 NOTE — Progress Notes (Signed)
I spent time over a period of several hours giving support to pt, Lisa Norton, as well as individual family members.  I worked alongside Engineer, civil (consulting) to provide comfort care as well as providing emotional and spiritual support.  I provided prayer at family's request and helped to assist family in making funeral decisions.  Please page as needs continue to arise over the next days here in the hospital.  Pager [place], bcc 4:05 PM    02/12/15 1600  Clinical Encounter Type  Visited With Patient;Family;Patient and family together  Visit Type Spiritual support  Referral From Nurse  Spiritual Encounters  Spiritual Needs Emotional;Grief support;Prayer  Stress Factors  Patient Stress Factors Loss  Family Stress Factors Loss

## 2015-02-12 NOTE — Transfer of Care (Signed)
Immediate Anesthesia Transfer of Care Note  Patient: Lisa Norton  Procedure(s) Performed: Procedure(s) with comments: CESAREAN SECTION (Bilateral) - Fetal Demise  Patient Location: PACU  Anesthesia Type:Spinal  Level of Consciousness: awake, alert  and oriented  Airway & Oxygen Therapy: Patient Spontanous Breathing  Post-op Assessment: Report given to RN  Post vital signs: Reviewed  Last Vitals:  Filed Vitals:   02/12/15 1041  BP: 143/73  Pulse: 100  Temp: 36.9 C  Resp: 20    Complications: No apparent anesthesia complications

## 2015-02-12 NOTE — Progress Notes (Signed)
Pt presented for her scheduled NST this am(02/11/2015).  Stated she felt the baby move between 6-7 am, was definitely moving last night according to her.  Unfortunately when she was being hooked up to the external doppler for her NST could not find the heartbeat.  Sonogram was performed to locate FHR and no fetal cardiac activity was noted.  I was called in by Chrissie Noa to confirm and indeed did confirm no cardiac activity. Additionally fluid was still good and there was no evidence of retroplacental clot.  Pt denied any bleeding or ROM or contractions, really she has felt good and no problems at all.  Her BS was a bit high for her last night but she took it early otherwise all of her BS have been outstanding and well controlled  Understandably the patient was very upset and I spent quite a bit of time going over everything with the patient.  We discussed timing and mode of delivery in detail.  We were proceeding with caesareans section next week due to EFW projected to that time of 5000 grams in a Class B diabetic.  She still very much wants to proceed with that mode of delivery.  I instructed her the most "right" management would be induction with vaginal delivery but she, despite the increased risks of operative delivery, wants a Caesarean section.  She is considering BTL still and that will be a time of surgery decision  As a result I scheduled Amy for delivery 6/15 as an add on.  See sonogram note

## 2015-02-13 ENCOUNTER — Encounter (HOSPITAL_COMMUNITY): Payer: Self-pay | Admitting: Obstetrics & Gynecology

## 2015-02-13 LAB — CBC
HEMATOCRIT: 33.5 % — AB (ref 36.0–46.0)
HEMOGLOBIN: 11.3 g/dL — AB (ref 12.0–15.0)
MCH: 33 pg (ref 26.0–34.0)
MCHC: 33.7 g/dL (ref 30.0–36.0)
MCV: 98 fL (ref 78.0–100.0)
Platelets: 173 10*3/uL (ref 150–400)
RBC: 3.42 MIL/uL — ABNORMAL LOW (ref 3.87–5.11)
RDW: 14.2 % (ref 11.5–15.5)
WBC: 13.6 10*3/uL — AB (ref 4.0–10.5)

## 2015-02-13 LAB — GLUCOSE, CAPILLARY: Glucose-Capillary: 92 mg/dL (ref 65–99)

## 2015-02-13 LAB — RPR: RPR Ser Ql: NONREACTIVE

## 2015-02-13 NOTE — Progress Notes (Signed)
Subjective: Postpartum Day 1: Cesarean Delivery for fetal macrosomia, in class B DM with Fetal demise at 38w2.  Patient reports difficulty coping. She has kept the baby in room now x 24 hours, and has agreed to sending baby to morgue by 10 am to avoid body tissue changes. Funeral home has been chosen, family member is arranging. No contact from funeral home to hospital to date..   Pt has been seen by social work and pastoral care Objective: Vital signs in last 24 hours: Temp:  [97.6 F (36.4 C)-98.7 F (37.1 C)] 97.8 F (36.6 C) (06/16 0503) Pulse Rate:  [63-100] 63 (06/16 0503) Resp:  [14-20] 18 (06/16 0503) BP: (90-155)/(43-88) 101/43 mmHg (06/16 0503) SpO2:  [96 %-100 %] 97 % (06/16 0503) Weight:  [226 lb (102.513 kg)] 226 lb (102.513 kg) (06/15 1041)  Physical Exam:  General: alert, appears stated age and greiving appropriately Lochia: appropriate Uterine Fundus: firm Incision: healing well, no significant drainage DVT Evaluation: No evidence of DVT seen on physical exam.   Recent Labs  02/12/15 0952 02/13/15 0515  HGB 14.2 11.3*  HCT 39.9 33.5*    Assessment/Plan: Status post Cesarean section. Postoperative course complicated by serial cbg's that are normal  Continue current care with probable d/c tomorrow.  Lisa Norton V 02/13/2015, 8:34 AM

## 2015-02-13 NOTE — Progress Notes (Signed)
Follow up, referral from [facility]. Patient, patient's daughter, patient's mom and dad also in the room. They are all processing their grief and trying to understand in different ways. Offered support; comfort; grief support. Brought them grief materials and prayer shawl for patient.   Epifania Gore, PhD, [place]

## 2015-02-13 NOTE — Anesthesia Postprocedure Evaluation (Signed)
Anesthesia Post Note  Patient: Lisa Norton  Procedure(s) Performed: Procedure(s) (LRB): CESAREAN SECTION (Bilateral)  Anesthesia type: SAB  Patient location: Mother/Baby  Post pain: Pain level controlled  Post assessment: Post-op Vital signs reviewed  Last Vitals:  Filed Vitals:   02/13/15 0503  BP: 101/43  Pulse: 63  Temp: 36.6 C  Resp: 18    Post vital signs: Reviewed  Level of consciousness: awake  Complications: No apparent anesthesia complications

## 2015-02-13 NOTE — Discharge Summary (Signed)
Obstetric Discharge Summary Reason for Admission: cesarean section for fetal macrosomia/class B DM, IUFD unknown etiology Prenatal Procedures: NST and ultrasound Intrapartum Procedures: cesarean: low cervical, transverse Postpartum Procedures: none Complications-Operative and Postpartum: none HEMOGLOBIN  Date Value Ref Range Status  02/13/2015 11.3* 12.0 - 15.0 g/dL Final    Comment:    REPEATED TO VERIFY DELTA CHECK NOTED    HCT  Date Value Ref Range Status  02/13/2015 33.5* 36.0 - 46.0 % Final   Hospital Course:  After presenting to routine PNV for NST, IUFD of unknow etiology was discovered.  She had normal testing, as recently as 3 days prior with an 8/8 BPP.  Pt is a class B diabetic, good control on meds.  She underwent an uncomplicated PLTCS for fetal macrosomia.  Postop course has been normal.  Pt is passing flatus, pain is well controlled,fasting  blood sugars are normal right now.(no meds)  Physical Exam:  General: alert, cooperative, no distress and appropriately grieving Lochia: appropriate Uterine Fundus: firm Incision: healing well, no significant drainage, no dehiscence, no significant erythema DVT Evaluation: No evidence of DVT seen on physical exam.  Discharge Diagnoses: Term IUFD, unknown etiology, delivered  Class B DM  Discharge Information: Date: 02/14/2015 Activity: pelvic rest Diet: routine Medications: Ibuprofen and Percocet.  Will adjust diabetes meds per blood sugars Condition: stable Instructions: refer to practice specific booklet Discharge to: home Follow-up Information    Follow up with [facility] In 10 days.   Why:  for incision check.     Contact information:   [place] [id number] [phone]      Newborn Data: stillborn female  Birth Weight: 10 lb 3 oz (4621 g) APGAR: 0/0,   .  CRESENZO-DISHMAN,Saryn Cherry 02/14/2015, 8:08 AM

## 2015-02-13 NOTE — Addendum Note (Signed)
Addendum  created 02/13/15 0737 by Talbot Grumbling, CRNA   Modules edited: Notes Section   Notes Section:  File: [id number]

## 2015-02-13 NOTE — Progress Notes (Signed)
Night Chaplain paged to come and speak with daughter prior to pt discharge. Daughter is in deep grief over the loss of another expected sibling. Counsel and comfort given outside the presence mother/patient and grandmother.  Sallee Lange. Jayme Mednick, [place]

## 2015-02-14 ENCOUNTER — Other Ambulatory Visit: Payer: Medicaid Other | Admitting: Obstetrics & Gynecology

## 2015-02-14 MED ORDER — IBUPROFEN 600 MG PO TABS: 600.0000 mg | ORAL_TABLET | Freq: Four times a day (QID) | ORAL | Status: DC

## 2015-02-14 MED ORDER — OXYCODONE-ACETAMINOPHEN 5-325 MG PO TABS: 1.0000 | ORAL_TABLET | ORAL | Status: DC | PRN

## 2015-02-14 MED ORDER — ZOLPIDEM TARTRATE 5 MG PO TABS: 5.0000 mg | ORAL_TABLET | Freq: Every evening | ORAL | Status: DC | PRN

## 2015-02-14 NOTE — Progress Notes (Addendum)
Pt saw baby for last time family with pt   Did not sign d/c papers  Due to emotional status   Just wanted to leave  Family with pt  instrutions reviewed earlier

## 2015-02-14 NOTE — Progress Notes (Signed)
Called pt at home and reviewed breast  Care and meds   No questions  Answered

## 2015-02-14 NOTE — Progress Notes (Signed)
I worked with family to provide additional emotional and spiritual support through their grief.  They requested to see burial cradle, and asked for additional hand mold which which we were able to accommodate.    They are aware of ongoing support including heartstrings, [facility] and our comfort program here.    [place] Pager, [phone] 4:02 PM    02/14/15 1500  Clinical Encounter Type  Visited With Patient  Visit Type Spiritual support;Follow-up  Referral From Nurse  Spiritual Encounters  Spiritual Needs Emotional;Grief support  Stress Factors  Patient Stress Factors Loss

## 2015-02-14 NOTE — Discharge Instructions (Signed)
Begin taking blood sugars fasting and 2 hours after 2 of your meals for a few days.  Bring the results to your appointment so that your blood sugar medication can be adjusted.    Cesarean Delivery, Care After Refer to this sheet in the next few weeks. These instructions provide you with information on caring for yourself after your procedure. Your health care provider may also give you specific instructions. Your treatment has been planned according to current medical practices, but problems sometimes occur. Call your health care provider if you have any problems or questions after you go home. HOME CARE INSTRUCTIONS  Only take over-the-counter or prescription medications as directed by your health care provider.  Continue to use good perineal care. Good perineal care includes:  Wiping your perineum from front to back.  Keeping your perineum clean.  Check your surgical cut (incision) daily for increased redness, drainage, swelling, or separation of skin.  Clean your incision gently with soap and water every day, and then pat it dry. If your health care provider says it is okay, leave the incision uncovered. Use a bandage (dressing) if the incision is draining fluid or appears irritated. If the adhesive strips across the incision do not fall off within 7 days, carefully peel them off.  Hug a pillow when coughing or sneezing until your incision is healed. This helps to relieve pain.  Do not use tampons or douche until your health care provider says it is okay.  Shower, wash your hair, and take tub baths as directed by your health care provider.  Wear a well-fitting bra that provides breast support.  Limit wearing support panties or control-top hose.  Drink enough fluids to keep your urine clear or pale yellow.  Eat high-fiber foods such as whole grain cereals and breads, brown rice, beans, and fresh fruits and vegetables every day. These foods may help prevent or relieve  constipation.  Resume activities such as climbing stairs, driving, lifting, exercising, or traveling as directed by your health care provider.  Talk to your health care provider about resuming sexual activities. This is dependent upon your risk of infection, your rate of healing, and your comfort and desire to resume sexual activity.  Try to have someone help you with your household activities and your newborn for at least a few days after you leave the hospital.  Rest as much as possible. Try to rest or take a nap when your newborn is sleeping.  Increase your activities gradually.  Keep all of your scheduled postpartum appointments. It is very important to keep your scheduled follow-up appointments. At these appointments, your health care provider will be checking to make sure that you are healing physically and emotionally. SEEK MEDICAL CARE IF:   You are passing large clots from your vagina. Save any clots to show your health care provider.  You have a foul smelling discharge from your vagina.  You have trouble urinating.  You are urinating frequently.  You have pain when you urinate.  You have a change in your bowel movements.  You have increasing redness, pain, or swelling near your incision.  You have pus draining from your incision.  Your incision is separating.  You have painful, hard, or reddened breasts.  You have a severe headache.  You have blurred vision or see spots.  You feel sad or depressed.  You have thoughts of hurting yourself   You have questions about your care or medications.  You are dizzy or light-headed.  You have a rash.  You have pain, redness, or swelling at the site of the removed intravenous access (IV) tube.  You have nausea or vomiting.  You have not had a menstrual period within 12 weeks of stopping.  You have a fever. SEEK IMMEDIATE MEDICAL CARE IF:  You have persistent pain.  You have chest pain.  You have shortness of  breath.  You faint.  You have leg pain.  You have stomach pain.  Your vaginal bleeding saturates 2 or more sanitary pads in 1 hour. MAKE SURE YOU:   Understand these instructions.  Will watch your condition.  Will get help right away if you are not doing well or get worse. Document Released: 05/08/2002 Document Revised: 12/31/2013 Document Reviewed: 04/12/2012 [facility] Patient Information 2015 [place], [place]. This information is not intended to replace advice given to you by your health care provider. Make sure you discuss any questions you have with your health care provider.

## 2015-02-17 ENCOUNTER — Other Ambulatory Visit (HOSPITAL_COMMUNITY): Payer: Medicaid Other

## 2015-02-18 ENCOUNTER — Other Ambulatory Visit: Payer: Self-pay | Admitting: Obstetrics & Gynecology

## 2015-02-20 ENCOUNTER — Encounter: Payer: Self-pay | Admitting: Obstetrics & Gynecology

## 2015-02-20 ENCOUNTER — Ambulatory Visit (INDEPENDENT_AMBULATORY_CARE_PROVIDER_SITE_OTHER): Payer: Medicaid Other | Admitting: Obstetrics & Gynecology

## 2015-02-20 VITALS — BP 130/80 | HR 72 | Wt 202.4 lb

## 2015-02-20 DIAGNOSIS — Z9889 Other specified postprocedural states: Secondary | ICD-10-CM

## 2015-02-20 MED ORDER — OXYCODONE-ACETAMINOPHEN 5-325 MG PO TABS: 1.0000 | ORAL_TABLET | ORAL | Status: DC | PRN

## 2015-02-25 ENCOUNTER — Encounter (HOSPITAL_COMMUNITY): Payer: Self-pay | Admitting: *Deleted

## 2015-03-06 ENCOUNTER — Encounter: Payer: Medicaid Other | Admitting: Obstetrics & Gynecology

## 2015-03-10 ENCOUNTER — Ambulatory Visit (INDEPENDENT_AMBULATORY_CARE_PROVIDER_SITE_OTHER): Payer: Medicaid Other | Admitting: Obstetrics & Gynecology

## 2015-03-10 VITALS — BP 140/86 | Ht 62.5 in | Wt 196.0 lb

## 2015-03-10 DIAGNOSIS — Z9889 Other specified postprocedural states: Secondary | ICD-10-CM

## 2015-03-19 ENCOUNTER — Encounter: Payer: Self-pay | Admitting: Obstetrics & Gynecology

## 2015-03-19 NOTE — Progress Notes (Signed)
Patient ID: Lisa Norton, female   DOB: 01-14-1971, 44 y.o.   MRN: [id number]  HPI: Patient returns for routine postoperative follow-up having undergone primary Caesarean section on 02/12/2015.  The patient's immediate postoperative recovery has been unremarkable. Since hospital discharge the patient reports no medical problems.  She certainly is seems to be mourning appropriately Spent quite a bit of time just talking answering questions.  sHe has no plan to hurt herself and while she still has certainly low point.  She does seem to be doing appropriately.  Given this circumstance  Did discuss future pregnancy and I'll try to recommend she wait at least 6 months preferably a year but in her words her time is running and I do agree I'll certainly be supportive of her decision   Current Outpatient Prescriptions: clonazePAM (KLONOPIN) 1 MG tablet, Take 1 mg by mouth 2 (two) times daily., Disp: , Rfl:  FLUoxetine (PROZAC) 40 MG capsule, Take 40 mg by mouth 2 (two) times daily., Disp: , Rfl:  ibuprofen (ADVIL,MOTRIN) 600 MG tablet, Take 1 tablet (600 mg total) by mouth every 6 (six) hours., Disp: 30 tablet, Rfl: 0 oxyCODONE-acetaminophen (PERCOCET/ROXICET) 5-325 MG per tablet, Take 1 tablet by mouth every 4 (four) hours as needed (for pain scale 4-7)., Disp: 30 tablet, Rfl: 0 zolpidem (AMBIEN) 5 MG tablet, Take 1 tablet (5 mg total) by mouth at bedtime as needed for sleep., Disp: 20 tablet, Rfl: 0  No current facility-administered medications for this visit.    Blood pressure 140/86, height 5' 2.5" (1.588 m), weight 196 lb (88.905 kg).  Physical Exam: incsion clean dry intact, GV placed for moisture and yeast control  Diagnostic Tests: none  Pathology: negative  Impression: S/P primary cesarean section for fetal macrosomia with a late third trimester fetal demise  Plan: PP exam 4 weeks  Follow up: 4  weeks  Florian Buff, MD

## 2015-03-25 ENCOUNTER — Ambulatory Visit (INDEPENDENT_AMBULATORY_CARE_PROVIDER_SITE_OTHER): Payer: Medicaid Other | Admitting: Obstetrics & Gynecology

## 2015-03-25 ENCOUNTER — Encounter: Payer: Self-pay | Admitting: Obstetrics & Gynecology

## 2015-03-25 ENCOUNTER — Ambulatory Visit: Payer: Medicaid Other | Admitting: Obstetrics & Gynecology

## 2015-03-25 MED ORDER — METRONIDAZOLE 500 MG PO TABS: 500.0000 mg | ORAL_TABLET | Freq: Two times a day (BID) | ORAL | Status: DC

## 2015-03-25 NOTE — Progress Notes (Signed)
Patient ID: Lisa Norton, female   DOB: Jan 01, 1971, 44 y.o.   MRN: [id number] Subjective:     Lisa Norton is a 44 y.o. female who presents for a postpartum visit. She is 6 weeks postpartum following a low cervical transverse Cesarean section. I have fully reviewed the prenatal and intrapartum course. The delivery was at 34 gestational weeks due to a fetal loss, previously scheduled. Outcome: primary cesarean section, low transverse incision. Anesthesia: spinal. Postpartum course has been unremarkable. Baby is feeding by na. Bleeding thin lochia. Bowel function is normal. Bladder function is abnormal: DI. Patient is not sexually active. Contraception method is none. Postpartum depression screening: negative.    Review of Systems Pertinent items are noted in HPI.   Objective:    BP 130/60 mmHg  Pulse 84  Wt 194 lb (87.998 kg)  General:  alert, cooperative and no distress   Breasts:    Lungs:   Heart:    Abdomen:    Vulva:  normal  Vagina: normal vagina, no discharge, exudate, lesion, or erythema  Cervix:  normal  Corpus: normal size, contour, position, consistency, mobility, non-tender  Adnexa:  normal adnexa  Rectal Exam:         Assessment:     normal postpartum exam. Pap smear not done at today's visit.   Plan:    1. Contraception:  2. Recommend wait 6 months at least before next pregnancy 3. Follow up in: 3 months or as needed.

## 2015-03-26 NOTE — Progress Notes (Signed)
Patient ID: Lisa Norton, female   DOB: Jun 15, 1971, 44 y.o.   MRN: [id number] Chief Complaint  Patient presents with  . Routine Post Op   Blood pressure 130/80, pulse 72, weight 202 lb 6.4 oz (91.808 kg).  1 week postop from a primary cesarean section Patient was artery scheduled for primary cesarean section due to fetal macrosomia but unfortunately experienced a fetal demise Intraoperatively there were no direct findings for the reason for the demise pathology reviewed as well Physically she's not have any complaints her incision is clean dry and intact gentian violet was placed Abdominal exam is benign  We talked some time regarding her emotional state she has no suicidal thoughts or ideation she is going through a normal grieving a morning process We talked for about 15-20 minutes She no she can reach out if she needs to she's artery and I anxiety and depression medication  Follow-up 2 weeks or if needed

## 2015-03-31 ENCOUNTER — Telehealth: Payer: Self-pay | Admitting: Obstetrics and Gynecology

## 2015-03-31 NOTE — Telephone Encounter (Signed)
Pt states having very heavy bleeding, has lightened up some. Pt states she thinks this is her 1 st period since she delivered in 01/2015. Pt states having to wear a diaper, no c/o dizziness, fatigue. Pt informed since vaginal bleeding has lightened some to continue to monitor, if symptoms (dizziness, fatigue) occur or bleeding becomes heavier  to call our office back. Pt verbalized understanding.

## 2015-07-29 ENCOUNTER — Ambulatory Visit (INDEPENDENT_AMBULATORY_CARE_PROVIDER_SITE_OTHER): Payer: Medicaid Other | Admitting: Obstetrics & Gynecology

## 2015-07-29 ENCOUNTER — Encounter: Payer: Self-pay | Admitting: Obstetrics & Gynecology

## 2015-07-29 VITALS — BP 120/80 | HR 82 | Wt 218.4 lb

## 2015-07-29 DIAGNOSIS — M797 Fibromyalgia: Secondary | ICD-10-CM | POA: Diagnosis not present

## 2015-07-29 DIAGNOSIS — Z3202 Encounter for pregnancy test, result negative: Secondary | ICD-10-CM

## 2015-07-29 DIAGNOSIS — M7918 Myalgia, other site: Secondary | ICD-10-CM

## 2015-07-29 DIAGNOSIS — R1032 Left lower quadrant pain: Secondary | ICD-10-CM | POA: Diagnosis not present

## 2015-07-29 LAB — POCT URINE PREGNANCY: Preg Test, Ur: NEGATIVE

## 2015-07-29 MED ORDER — NORETHIN-ETH ESTRAD-FE BIPHAS 1 MG-10 MCG / 10 MCG PO TABS: 1.0000 | ORAL_TABLET | Freq: Every day | ORAL | Status: DC

## 2015-07-29 MED ORDER — METRONIDAZOLE 500 MG PO TABS: 500.0000 mg | ORAL_TABLET | Freq: Two times a day (BID) | ORAL | Status: DC

## 2015-07-29 NOTE — Progress Notes (Signed)
Patient ID: Lisa Norton, female   DOB: February 20, 1971, 44 y.o.   MRN: [id number]      Chief Complaint  Patient presents with  . gyn visit    hurting on left side.2rd period this month.    Blood pressure 120/80, pulse 82, weight 218 lb 6.4 oz (99.066 kg), last menstrual period 07/16/2015.  44 y.o. G2P2001 Patient's last menstrual period was 07/16/2015. The current method of family planning is none.  Subjective LLQ pain 2nd month  Objective +LLQ trigger point  Pertinent ROS No burning with urination, frequency or urgency No nausea, vomiting or diarrhea Nor fever chills or other constitutional symptoms   Labs or studies     Impression Diagnoses this Encounter::   ICD-9-CM ICD-10-CM   1. Myofascial muscle pain 729.1 M79.7   2. Negative pregnancy test V72.41 Z32.02 POCT urine pregnancy    Established relevant diagnosis(es):   Plan/Recommendations: No orders of the defined types were placed in this encounter.    Labs or Scans Ordered: Orders Placed This Encounter  Procedures  . POCT urine pregnancy      Follow up        All questions were answered.   Trigger Point Injection   Pre-operative diagnosis: myofascial pain  Post-operative diagnosis: myofascial pain  After risks and benefits were explained including bleeding, infection, worsening of the pain, damage to the area being injected, weakness, allergic reaction to medications, vascular injection, and nerve damage, signed consent was obtained.  All questions were answered.    The area of the trigger point was identified and the skin prepped three times with alcohol and the alcohol allowed to dry.  Next, a 25 gauge 0.5 inch needle was placed in the area of the trigger point.  Once reproduction of the pain was elicited and negative aspiration confirmed, the trigger point was injected and the needle removed.    The patient did tolerate the procedure well and there were not complications.    Medication  used: 10 cc 0.5% marcaine  Trigger points injected: 1    Trigger point(s) location(s):  left

## 2015-11-11 ENCOUNTER — Other Ambulatory Visit (HOSPITAL_COMMUNITY)
Admission: RE | Admit: 2015-11-11 | Discharge: 2015-11-11 | Disposition: A | Discharge status: Home / Self Care | Payer: Medicaid Other | Source: Ambulatory Visit | Attending: Obstetrics & Gynecology | Admitting: Obstetrics & Gynecology

## 2015-11-11 ENCOUNTER — Ambulatory Visit (INDEPENDENT_AMBULATORY_CARE_PROVIDER_SITE_OTHER): Payer: Medicaid Other | Admitting: Obstetrics & Gynecology

## 2015-11-11 ENCOUNTER — Encounter: Payer: Self-pay | Admitting: Obstetrics & Gynecology

## 2015-11-11 VITALS — BP 120/80 | HR 72 | Ht 62.0 in | Wt 232.0 lb

## 2015-11-11 DIAGNOSIS — Z01419 Encounter for gynecological examination (general) (routine) without abnormal findings: Secondary | ICD-10-CM

## 2015-11-11 DIAGNOSIS — Z1151 Encounter for screening for human papillomavirus (HPV): Secondary | ICD-10-CM | POA: Insufficient documentation

## 2015-11-11 DIAGNOSIS — Z0000 Encounter for general adult medical examination without abnormal findings: Secondary | ICD-10-CM

## 2015-11-11 MED ORDER — SOLIFENACIN SUCCINATE 10 MG PO TABS: 10.0000 mg | ORAL_TABLET | Freq: Every day | ORAL | Status: DC

## 2015-11-11 MED ORDER — NORETHIN-ETH ESTRAD-FE BIPHAS 1 MG-10 MCG / 10 MCG PO TABS: 1.0000 | ORAL_TABLET | Freq: Every day | ORAL | Status: DC

## 2015-11-11 NOTE — Progress Notes (Signed)
Patient ID: Lisa Norton, female   DOB: [birth year], 45 y.o.   MRN: [id number] Subjective:     Lisa Norton is a 45 y.o. female here for a routine exam.  Patient's last menstrual period was 10/28/2015. [id number] Birth Control Method:  OCP Menstrual Calendar(currently): regular  Current complaints: mixed urinary incontinence, depression/anxiety.   Current acute medical issues:  none   Recent Gynecologic History Patient's last menstrual period was 10/28/2015. Last Pap: 2016,  normal Last mammogram: ,    Past Medical History  Diagnosis Date  . COLD SORE 05/15/2010  . TOBACCO ABUSE 12/12/2009  . DEPRESSION 12/12/2009  . HYPERTENSION 12/12/2009  . PREDIABETES 02/11/2010  . Diabetes mellitus without complication ([place])   . 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 Cholecystectomy  2006  . Tonsillectomy  1988  . Cervical fusion    . Cesarean section with bilateral tubal ligation Bilateral 02/12/2015    Procedure: CESAREAN SECTION;  Surgeon: Florian Buff, MD;  Location: [facility];  Service: Obstetrics;  Laterality: Bilateral;  Fetal Demise    OB History    Gravida Para Term Preterm AB TAB SAB Ectopic Multiple Living   2 2 2  0 0 0 0 0 0 1      Social History   Social History  . Marital Status: Married    Spouse Name: N/A  . Number of Children: N/A  . Years of Education: N/A   Social History Main Topics  . Smoking status: Current Every Day Smoker -- 0.50 packs/day for 20 years    Types: Cigarettes  . Smokeless tobacco: None  . Alcohol Use: No  . Drug Use: No  . Sexual Activity: Not Currently   Other Topics Concern  . None   Social History Narrative    Family History  Problem Relation Age of Onset  . Heart disease Mother   . Narcolepsy Mother      Current outpatient prescriptions:  .  clonazePAM (KLONOPIN) 1 MG tablet, Take 1 mg by mouth 2 (two) times daily., Disp: , Rfl:  .  FLUoxetine (PROZAC) 40 MG capsule, Take 40 mg by  mouth 2 (two) times daily., Disp: , Rfl:  .  ibuprofen (ADVIL,MOTRIN) 600 MG tablet, Take 1 tablet (600 mg total) by mouth every 6 (six) hours., Disp: 30 tablet, Rfl: 0 .  Norethindrone-Ethinyl Estradiol-Fe Biphas (LO LOESTRIN FE) 1 MG-10 MCG / 10 MCG tablet, Take 1 tablet by mouth daily. Take 1 daily, Disp: 1 Package, Rfl: 11 .  solifenacin (VESICARE) 10 MG tablet, Take 1 tablet (10 mg total) by mouth daily., Disp: 30 tablet, Rfl: 11  Review of Systems  Review of Systems  Constitutional: Negative for fever, chills, weight loss, malaise/fatigue and diaphoresis.  HENT: Negative for hearing loss, ear pain, nosebleeds, congestion, sore throat, neck pain, tinnitus and ear discharge.   Eyes: Negative for blurred vision, double vision, photophobia, pain, discharge and redness.  Respiratory: Negative for cough, hemoptysis, sputum production, shortness of breath, wheezing and stridor.   Cardiovascular: Negative for chest pain, palpitations, orthopnea, claudication, leg swelling and PND.  Gastrointestinal: negative for abdominal pain. Negative for heartburn, nausea, vomiting, diarrhea, constipation, blood in stool and melena.  Genitourinary: Negative for dysuria, urgency, frequency, hematuria and flank pain.  Musculoskeletal: Negative for myalgias, back pain, joint pain and falls.  Skin: Negative for itching and rash.  Neurological: Negative for dizziness, tingling, tremors, sensory change, speech change, focal weakness, seizures, loss of consciousness,  weakness and headaches.  Endo/Heme/Allergies: Negative for environmental allergies and polydipsia. Does not bruise/bleed easily.  Psychiatric/Behavioral: Negative for depression, suicidal ideas, hallucinations, memory loss and substance abuse. The patient is not nervous/anxious and does not have insomnia.        Objective:  Blood pressure 120/80, pulse 72, height 5\' 2"  (1.575 m), weight 232 lb (105.235 kg), last menstrual period 10/28/2015.   Physical  Exam  Vitals reviewed. Constitutional: She is oriented to person, place, and time. She appears well-developed and well-nourished.  HENT:  Head: Normocephalic and atraumatic.        Right Ear: External ear normal.  Left Ear: External ear normal.  Nose: Nose normal.  Mouth/Throat: Oropharynx is clear and moist.  Eyes: Conjunctivae and EOM are normal. Pupils are equal, round, and reactive to light. Right eye exhibits no discharge. Left eye exhibits no discharge. No scleral icterus.  Neck: Normal range of motion. Neck supple. No tracheal deviation present. No thyromegaly present.  Cardiovascular: Normal rate, regular rhythm, normal heart sounds and intact distal pulses.  Exam reveals no gallop and no friction rub.   No murmur heard. Respiratory: Effort normal and breath sounds normal. No respiratory distress. She has no wheezes. She has no rales. She exhibits no tenderness.  GI: Soft. Bowel sounds are normal. She exhibits no distension and no mass. There is no tenderness. There is no rebound and no guarding.  Genitourinary:  Breasts no masses skin changes or nipple changes bilaterally      Vulva is normal without lesions Vagina is pink moist without discharge Cervix normal in appearance and pap is done Uterus is normal size shape and contour Adnexa is negative with normal sized ovaries   Musculoskeletal: Normal range of motion. She exhibits no edema and no tenderness.  Neurological: She is alert and oriented to person, place, and time. She has normal reflexes. She displays normal reflexes. No cranial nerve deficit. She exhibits normal muscle tone. Coordination normal.  Skin: Skin is warm and dry. No rash noted. No erythema. No pallor.  Psychiatric: She has a normal mood and affect. Her behavior is normal. Judgment and thought content normal.       Assessment:    Healthy female exam.    Plan:    Contraception: OCP (estrogen/progesterone). Mammogram ordered. Follow up in: 1 month.     Meds ordered this encounter  Medications  . solifenacin (VESICARE) 10 MG tablet    Sig: Take 1 tablet (10 mg total) by mouth daily.    Dispense:  30 tablet    Refill:  11  . Norethindrone-Ethinyl Estradiol-Fe Biphas (LO LOESTRIN FE) 1 MG-10 MCG / 10 MCG tablet    Sig: Take 1 tablet by mouth daily. Take 1 daily    Dispense:  1 Package    Refill:  11    No orders of the defined types were placed in this encounter.

## 2015-11-13 LAB — CYTOLOGY - PAP

## 2016-05-05 ENCOUNTER — Ambulatory Visit (INDEPENDENT_AMBULATORY_CARE_PROVIDER_SITE_OTHER): Payer: Medicaid Other | Admitting: Family Medicine

## 2016-05-05 ENCOUNTER — Encounter: Payer: Self-pay | Admitting: Family Medicine

## 2016-05-05 VITALS — BP 116/77 | HR 77 | Temp 97.9°F | Ht 62.0 in | Wt 239.0 lb

## 2016-05-05 DIAGNOSIS — W57XXXA Bitten or stung by nonvenomous insect and other nonvenomous arthropods, initial encounter: Secondary | ICD-10-CM

## 2016-05-05 DIAGNOSIS — S3091XA Unspecified superficial injury of lower back and pelvis, initial encounter: Secondary | ICD-10-CM

## 2016-05-05 MED ORDER — GLIMEPIRIDE 2 MG PO TABS: 2.0000 mg | ORAL_TABLET | Freq: Every day | ORAL | 11 refills | Status: DC

## 2016-05-05 MED ORDER — DOXYCYCLINE HYCLATE 100 MG PO CAPS: 100.0000 mg | ORAL_CAPSULE | Freq: Two times a day (BID) | ORAL | 0 refills | Status: DC

## 2016-05-05 MED ORDER — TRIAMCINOLONE ACETONIDE 0.1 % EX CREA: 1.0000 "application " | TOPICAL_CREAM | Freq: Three times a day (TID) | CUTANEOUS | 0 refills | Status: DC

## 2016-05-05 NOTE — Progress Notes (Signed)
Subjective:  Patient ID: Lisa Norton, female    DOB: 1971-02-11  Age: 45 y.o. MRN: [id number]  CC: Insect Bite (pt was bitten by an insect on left upper back and it itches)   HPI Lisa Norton presents for Concern about a tick bite that occurred about 8 days ago. It has been itching. She reached behind her and felt it on her back. She removed it. When she looked at it it appeared to be a tick but she only had a quick moment to take a look because the wind blew it away. She says it was quite small. It was not engorged. She can't be sure enough about the size to determine if it was a dog tick or dear tick. On the other hand she was visiting in [place] when this occurred. She has had no systemic symptoms such as fever chills or sweats. The rash has been local only. History Lisa Norton has a past medical history of COLD SORE (05/15/2010); DEPRESSION (12/12/2009); Diabetes mellitus without complication ([place]); HSV-2 seropositive (12/04/14); HYPERTENSION (12/12/2009); PREDIABETES (02/11/2010); and TOBACCO ABUSE (12/12/2009).   She has a past surgical history that includes Cholecystectomy (2006); Tonsillectomy (1988); Cervical fusion; and Cesarean section with bilateral tubal ligation (Bilateral, 02/12/2015).   Her family history includes Heart disease in her mother; Narcolepsy in her mother.She reports that she has been smoking Cigarettes.  She has a 10.00 pack-year smoking history. She quit smokeless tobacco use about 30 years ago. Her smokeless tobacco use included Snuff. She reports that she does not drink alcohol or use drugs.    ROS Review of Systems  Constitutional: Negative for activity change, appetite change and fever.  HENT: Negative for congestion, rhinorrhea and sore throat.   Eyes: Negative for visual disturbance.  Respiratory: Negative for cough and shortness of breath.   Cardiovascular: Negative for chest pain and palpitations.  Gastrointestinal: Negative for abdominal pain, diarrhea and  nausea.  Genitourinary: Negative for dysuria.  Musculoskeletal: Negative for arthralgias and myalgias.    Objective:  BP 116/77   Pulse 77   Temp 97.9 F (36.6 C) (Oral)   Ht 5\' 2"  (1.575 m)   Wt 239 lb (108.4 kg)   LMP 04/04/2016 (Approximate)   BMI 43.71 kg/m   BP Readings from Last 3 Encounters:  05/05/16 116/77  11/11/15 120/80  07/29/15 120/80    Wt Readings from Last 3 Encounters:  05/05/16 239 lb (108.4 kg)  11/11/15 232 lb (105.2 kg)  07/29/15 218 lb 6.4 oz (99.1 kg)     Physical Exam  Constitutional: She is oriented to person, place, and time. She appears well-developed and well-nourished. No distress.  HENT:  Head: Normocephalic and atraumatic.  Eyes: Conjunctivae are normal. Pupils are equal, round, and reactive to light.  Neck: Normal range of motion. Neck supple. No thyromegaly present.  Cardiovascular: Normal rate, regular rhythm and normal heart sounds.   No murmur heard. Pulmonary/Chest: Effort normal and breath sounds normal. No respiratory distress. She has no wheezes. She has no rales.  Abdominal: Soft. Bowel sounds are normal. She exhibits no distension. There is no tenderness.  Musculoskeletal: Normal range of motion.  Lymphadenopathy:    She has no cervical adenopathy.  Neurological: She is alert and oriented to person, place, and time.  Skin: Skin is warm and dry. Rash (liver lesion in question is 1 x 2 cm it is at the left posterior back at approximately the costal margin. It has mild to moderate erythema and is slightly  raised. There are no other lesions. There is a central nidus.) noted.  Psychiatric: She has a normal mood and affect. Her behavior is normal. Judgment and thought content normal.     Lab Results  Component Value Date   WBC 13.6 (H) 02/13/2015   HGB 11.3 (L) 02/13/2015   HCT 33.5 (L) 02/13/2015   PLT 173 02/13/2015   GLUCOSE 159 (H) 02/12/2015   CHOL 220 (H) 04/25/2014   TRIG 220.0 (H) 04/25/2014   HDL 20.90 (L)  04/25/2014   LDLDIRECT 184.2 04/25/2014   LDLCALC 97 01/05/2012   ALT 17 02/12/2015   AST 21 02/12/2015   NA 135 02/12/2015   K 4.1 02/12/2015   CL 107 02/12/2015   CREATININE 0.72 02/12/2015   BUN 12 02/12/2015   CO2 22 02/12/2015   TSH 1.32 01/31/2013   HGBA1C 5.7 (H) 08/12/2014   MICROALBUR 0.2 10/08/2013    No results found.  Assessment & Plan:   Lisa Norton was seen today for insect bite.  Diagnoses and all orders for this visit:  Tick bite  Other orders -     doxycycline (VIBRAMYCIN) 100 MG capsule; Take 1 capsule (100 mg total) by mouth 2 (two) times daily. -     triamcinolone cream (KENALOG) 0.1 %; Apply 1 application topically 3 (three) times daily. Avoid face and genitalia -     glimepiride (AMARYL) 2 MG tablet; Take 1 tablet (2 mg total) by mouth daily with breakfast.      I have discontinued Lisa Norton's solifenacin, Norethindrone-Ethinyl Estradiol-Fe Biphas, and glimepiride. I am also having her start on doxycycline, triamcinolone cream, and glimepiride. Additionally, I am having her maintain her ibuprofen, clonazePAM, and FLUoxetine.  Meds ordered this encounter  Medications  . DISCONTD: glimepiride (AMARYL) 2 MG tablet    Sig: Take 1 mg by mouth daily with breakfast.  . doxycycline (VIBRAMYCIN) 100 MG capsule    Sig: Take 1 capsule (100 mg total) by mouth 2 (two) times daily.    Dispense:  20 capsule    Refill:  0  . triamcinolone cream (KENALOG) 0.1 %    Sig: Apply 1 application topically 3 (three) times daily. Avoid face and genitalia    Dispense:  45 g    Refill:  0  . glimepiride (AMARYL) 2 MG tablet    Sig: Take 1 tablet (2 mg total) by mouth daily with breakfast.    Dispense:  30 tablet    Refill:  11     Follow-up: Return if symptoms worsen or fail to improve.  Lisa Norton, M.D.

## 2016-06-14 ENCOUNTER — Ambulatory Visit (INDEPENDENT_AMBULATORY_CARE_PROVIDER_SITE_OTHER): Payer: Self-pay | Admitting: Nurse Practitioner

## 2016-06-14 ENCOUNTER — Encounter: Payer: Self-pay | Admitting: Nurse Practitioner

## 2016-06-14 VITALS — BP 119/76 | HR 75 | Temp 97.0°F | Ht 62.0 in | Wt 236.0 lb

## 2016-06-14 DIAGNOSIS — K645 Perianal venous thrombosis: Secondary | ICD-10-CM

## 2016-06-14 MED ORDER — HYDROCORTISONE ACETATE 25 MG RE SUPP: 25.0000 mg | Freq: Two times a day (BID) | RECTAL | 0 refills | Status: DC

## 2016-06-14 MED ORDER — STARCH 51 % RE SUPP: 1.0000 | RECTAL | 0 refills | Status: DC | PRN

## 2016-06-14 NOTE — Patient Instructions (Signed)

## 2016-06-14 NOTE — Progress Notes (Signed)
   Subjective:    Patient ID: Lisa Norton, female    DOB: December 05, 1970, 45 y.o.   MRN: [id number]  HPI Patient here today with a complain of rectal pain. Patient thinks the pain coming from from her hemorrhoids. She has a history of hemorrhoids, but has never been bothered by it until now. The pain started last week, it was mild at first but has progressively gotten worse. The pain is more when she sits or when she walks. The pain is relieved when she lays on her side. She has tried preparation H cream and pads, she has also ran warm water over the hemorrhoids but has not gotten any significant relief. Nothing precipitated the hemorrhoid pain. 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  Review of Systems  Constitutional: Negative.  Negative for appetite change, fatigue and unexpected weight change.  Respiratory: Negative.   Cardiovascular: Negative.   Gastrointestinal: Positive for rectal pain. Negative for anal bleeding, blood in stool, nausea and vomiting. Diarrhea: diarrhea in the last two days.  Psychiatric/Behavioral: Negative.        Objective:   Physical Exam  Constitutional: She appears well-developed and well-nourished.  .  Cardiovascular: Normal rate and regular rhythm.   Abdominal: Soft. Bowel sounds are normal.  Genitourinary:             Assessment & Plan:  1. Hemorrhoids, external, thrombosed Sitz bath Limit time on commode Use stool softeners as needed, avoid straining - hydrocortisone (ANUSOL-HC) 25 MG suppository; Place 1 suppository (25 mg total) rectally 2 (two) times daily.  Dispense: 12 suppository; Refill: 0  RTO if symptom worsens or does not improve in 3 days  Jari Favre, RN, NP Student Mary-Margaret Hassell Done, FNP

## 2016-06-17 ENCOUNTER — Telehealth: Payer: Self-pay

## 2016-06-18 NOTE — Telephone Encounter (Signed)
Will have to use preparation H OTC then

## 2016-06-18 NOTE — Telephone Encounter (Signed)
Pt aware of recommendation. Her prescription went to [place]. They recommended other medication in place of the Anusal.

## 2016-09-06 ENCOUNTER — Encounter: Payer: Self-pay | Admitting: Family

## 2016-09-06 ENCOUNTER — Ambulatory Visit (INDEPENDENT_AMBULATORY_CARE_PROVIDER_SITE_OTHER): Payer: Self-pay | Admitting: Family

## 2016-09-06 VITALS — BP 115/72 | HR 82 | Temp 97.5°F | Ht 62.0 in | Wt 245.6 lb

## 2016-09-06 DIAGNOSIS — E669 Obesity, unspecified: Secondary | ICD-10-CM

## 2016-09-06 DIAGNOSIS — L0291 Cutaneous abscess, unspecified: Secondary | ICD-10-CM

## 2016-09-06 DIAGNOSIS — IMO0001 Reserved for inherently not codable concepts without codable children: Secondary | ICD-10-CM

## 2016-09-06 DIAGNOSIS — F17200 Nicotine dependence, unspecified, uncomplicated: Secondary | ICD-10-CM

## 2016-09-06 DIAGNOSIS — M722 Plantar fascial fibromatosis: Secondary | ICD-10-CM

## 2016-09-06 DIAGNOSIS — Z6841 Body Mass Index (BMI) 40.0 and over, adult: Secondary | ICD-10-CM

## 2016-09-06 DIAGNOSIS — E1165 Type 2 diabetes mellitus with hyperglycemia: Secondary | ICD-10-CM

## 2016-09-06 LAB — BAYER DCA HB A1C WAIVED: HB A1C (BAYER DCA - WAIVED): 6.5 % (ref <7.0)

## 2016-09-06 MED ORDER — CEPHALEXIN 500 MG PO CAPS: 500.0000 mg | ORAL_CAPSULE | Freq: Three times a day (TID) | ORAL | 0 refills | Status: DC

## 2016-09-06 MED ORDER — NAPROXEN 500 MG PO TABS: 500.0000 mg | ORAL_TABLET | Freq: Two times a day (BID) | ORAL | 1 refills | Status: DC

## 2016-09-06 NOTE — Patient Instructions (Signed)
Diabetes Mellitus and Food It is important for you to manage your blood sugar (glucose) level. Your blood glucose level can be greatly affected by what you eat. Eating healthier foods in the appropriate amounts throughout the day at about the same time each day will help you control your blood glucose level. It can also help slow or prevent worsening of your diabetes mellitus. Healthy eating may even help you improve the level of your blood pressure and reach or maintain a healthy weight. General recommendations for healthful eating and cooking habits include:  Eating meals and snacks regularly. Avoid going long periods of time without eating to lose weight.  Eating a diet that consists mainly of plant-based foods, such as fruits, vegetables, nuts, legumes, and whole grains.  Using low-heat cooking methods, such as baking, instead of high-heat cooking methods, such as deep frying.  Work with your dietitian to make sure you understand how to use the Nutrition Facts information on food labels. How can food affect me? Carbohydrates Carbohydrates affect your blood glucose level more than any other type of food. Your dietitian will help you determine how many carbohydrates to eat at each meal and teach you how to count carbohydrates. Counting carbohydrates is important to keep your blood glucose at a healthy level, especially if you are using insulin or taking certain medicines for diabetes mellitus. Alcohol Alcohol can cause sudden decreases in blood glucose (hypoglycemia), especially if you use insulin or take certain medicines for diabetes mellitus. Hypoglycemia can be a life-threatening condition. Symptoms of hypoglycemia (sleepiness, dizziness, and disorientation) are similar to symptoms of having too much alcohol. If your health care provider has given you approval to drink alcohol, do so in moderation and use the following guidelines:  Women should not have more than one drink per day, and men  should not have more than two drinks per day. One drink is equal to: ? 12 oz of beer. ? 5 oz of wine. ? 1 oz of hard liquor.  Do not drink on an empty stomach.  Keep yourself hydrated. Have water, diet soda, or unsweetened iced tea.  Regular soda, juice, and other mixers might contain a lot of carbohydrates and should be counted.  What foods are not recommended? As you make food choices, it is important to remember that all foods are not the same. Some foods have fewer nutrients per serving than other foods, even though they might have the same number of calories or carbohydrates. It is difficult to get your body what it needs when you eat foods with fewer nutrients. Examples of foods that you should avoid that are high in calories and carbohydrates but low in nutrients include:  Trans fats (most processed foods list trans fats on the Nutrition Facts label).  Regular soda.  Juice.  Candy.  Sweets, such as cake, pie, doughnuts, and cookies.  Fried foods.  What foods can I eat? Eat nutrient-rich foods, which will nourish your body and keep you healthy. The food you should eat also will depend on several factors, including:  The calories you need.  The medicines you take.  Your weight.  Your blood glucose level.  Your blood pressure level.  Your cholesterol level.  You should eat a variety of foods, including:  Protein. ? Lean cuts of meat. ? Proteins low in saturated fats, such as fish, egg whites, and beans. Avoid processed meats.  Fruits and vegetables. ? Fruits and vegetables that may help control blood glucose levels, such as apples,   mangoes, and yams.  Dairy products. ? Choose fat-free or low-fat dairy products, such as milk, yogurt, and cheese.  Grains, bread, pasta, and rice. ? Choose whole grain products, such as multigrain bread, whole oats, and brown rice. These foods may help control blood pressure.  Fats. ? Foods containing healthful fats, such as  nuts, avocado, olive oil, canola oil, and fish.  Does everyone with diabetes mellitus have the same meal plan? Because every person with diabetes mellitus is different, there is not one meal plan that works for everyone. It is very important that you meet with a dietitian who will help you create a meal plan that is just right for you. This information is not intended to replace advice given to you by your health care provider. Make sure you discuss any questions you have with your health care provider. Document Released: 05/13/2005 Document Revised: 01/22/2016 Document Reviewed: 07/13/2013 Elsevier Interactive Patient Education  2017 Elsevier Inc.  

## 2016-09-06 NOTE — Progress Notes (Signed)
Subjective:    Patient ID: Lisa Norton, female    DOB: 1970-09-14, 46 y.o.   MRN: [id number]  Pt presents to the office today with left nose swelling and tenderness. PT states this started three days ago and has become worse. Pt has tried abreva with no relief. Pt states she is having constant pain of 3-4 out 10, but a 10 out 10 if she touches or hits her nose.  Foot Injury   The incident occurred more than 1 week ago. Injury mechanism: plantar fasciitis  The pain is present in the left foot and right foot. The quality of the pain is described as aching. The pain is at a severity of 7/10. The pain is moderate. The pain has been intermittent since onset. She reports no foreign bodies present. She has tried acetaminophen and NSAIDs for the symptoms. The treatment provided mild relief.  Diabetes  She presents for her follow-up diabetic visit. She has type 2 diabetes mellitus. There are no hypoglycemic associated symptoms. Associated symptoms include foot paresthesias. Pertinent negatives for diabetes include no foot ulcerations. There are no hypoglycemic complications. Symptoms are worsening. Pertinent negatives for diabetic complications include no CVA, heart disease, nephropathy or peripheral neuropathy. Risk factors for coronary artery disease include diabetes mellitus, dyslipidemia, obesity, post-menopausal, sedentary lifestyle and family history. Current diabetic treatment includes oral agent (monotherapy). She is compliant with treatment all of the time. She is following a diabetic diet. Her breakfast blood glucose range is generally >200 mg/dl. An ACE inhibitor/angiotensin II receptor blocker is not being taken.      Review of Systems  HENT: Positive for rhinorrhea.        Left nostril swelling  All other systems reviewed and are negative.      Objective:   Physical Exam  Constitutional: She is oriented to person, place, and time. She appears well-developed and well-nourished. No  distress.  HENT:  Head: Normocephalic.  Nose: Mucosal edema (left) and sinus tenderness (left) present.  Eyes: Pupils are equal, round, and reactive to light.  Neck: Normal range of motion. Neck supple. No thyromegaly present.  Cardiovascular: Normal rate, regular rhythm, normal heart sounds and intact distal pulses.   No murmur heard. Pulmonary/Chest: Effort normal and breath sounds normal. No respiratory distress. She has no wheezes.  Abdominal: Soft. Bowel sounds are normal. She exhibits no distension. There is no tenderness.  Musculoskeletal: Normal range of motion. She exhibits no edema or tenderness.  Neurological: She is alert and oriented to person, place, and time. She has normal reflexes. No cranial nerve deficit.  Skin: Skin is warm and dry.  Psychiatric: She has a normal mood and affect. Her behavior is normal. Judgment and thought content normal.  Vitals reviewed.     BP 115/72   Pulse 82   Temp 97.5 F (36.4 C) (Oral)   Ht '5\' 2"'  (1.575 m)   Wt 245 lb 9.6 oz (111.4 kg)   BMI 44.92 kg/m      Assessment & Plan:  1. Type 2 diabetes mellitus with hyperglycemia, unspecified long term insulin use status (HCC) - CMP14+EGFR - Bayer DCA Hb A1c Waived - Microalbumin / creatinine urine ratio  2. Class 3 obesity with serious comorbidity and body mass index (BMI) of 40.0 to 44.9 in adult, unspecified obesity type ([place]) - CMP14+EGFR  3. TOBACCO ABUSE -Smoking cessation discussed - CMP14+EGFR  4. Abscess -Warm compresses  -Do not squezze - CMP14+EGFR - cephALEXin (KEFLEX) 500 MG capsule; Take 1 capsule (  500 mg total) by mouth 3 (three) times daily.  Dispense: 21 capsule; Refill: 0  5. Plantar fasciitis -Ice -Rest -ROM exercises discussed - CMP14+EGFR - naproxen (NAPROSYN) 500 MG tablet; Take 1 tablet (500 mg total) by mouth 2 (two) times daily with a meal.  Dispense: 60 tablet; Refill: 1   Continue all meds Labs pending Health Maintenance reviewed Diet and  exercise encouraged RTO 2 months   Evelina Dun, FNP

## 2016-09-07 LAB — CMP14+EGFR
ALBUMIN: 3.8 g/dL (ref 3.5–5.5)
ALK PHOS: 103 IU/L (ref 39–117)
ALT: 13 IU/L (ref 0–32)
AST: 12 IU/L (ref 0–40)
Albumin/Globulin Ratio: 1.4 (ref 1.2–2.2)
BUN / CREAT RATIO: 22 (ref 9–23)
BUN: 16 mg/dL (ref 6–24)
CHLORIDE: 100 mmol/L (ref 96–106)
CO2: 24 mmol/L (ref 18–29)
Calcium: 9 mg/dL (ref 8.7–10.2)
Creatinine, Ser: 0.73 mg/dL (ref 0.57–1.00)
GFR calc non Af Amer: 101 mL/min/{1.73_m2} (ref >59)
GFR, EST AFRICAN AMERICAN: 117 mL/min/{1.73_m2} (ref >59)
GLOBULIN, TOTAL: 2.7 g/dL (ref 1.5–4.5)
GLUCOSE: 159 mg/dL — AB (ref 65–99)
Potassium: 4 mmol/L (ref 3.5–5.2)
SODIUM: 141 mmol/L (ref 134–144)
TOTAL PROTEIN: 6.5 g/dL (ref 6.0–8.5)

## 2016-09-07 LAB — MICROALBUMIN / CREATININE URINE RATIO
CREATININE, UR: 89.8 mg/dL
Microalb/Creat Ratio: 4.5 mg/g creat (ref 0.0–30.0)
Microalbumin, Urine: 4 ug/mL

## 2016-11-10 ENCOUNTER — Encounter: Payer: Self-pay | Admitting: Family Medicine

## 2016-11-10 ENCOUNTER — Ambulatory Visit (INDEPENDENT_AMBULATORY_CARE_PROVIDER_SITE_OTHER): Payer: Self-pay | Admitting: Family Medicine

## 2016-11-10 VITALS — BP 139/85 | HR 98 | Temp 98.0°F | Ht 62.0 in | Wt 241.5 lb

## 2016-11-10 DIAGNOSIS — J209 Acute bronchitis, unspecified: Secondary | ICD-10-CM

## 2016-11-10 LAB — VERITOR FLU A/B WAIVED
Influenza A: NEGATIVE
Influenza B: NEGATIVE

## 2016-11-10 MED ORDER — CEPHALEXIN 500 MG PO CAPS: 500.0000 mg | ORAL_CAPSULE | Freq: Four times a day (QID) | ORAL | 0 refills | Status: DC

## 2016-11-10 NOTE — Progress Notes (Signed)
BP 139/85   Pulse 98   Temp 98 F (36.7 C) (Oral)   Ht 5\' 2"  (1.575 m)   Wt 241 lb 8 oz (109.5 kg)   BMI 44.17 kg/m    Subjective:    Patient ID: Lisa Norton, female    DOB: December 22, 1970, 46 y.o.   MRN: [id number]  HPI: Lisa Norton is a 46 y.o. female presenting on 11/10/2016 for Generalized Body Aches; Chest congestion, cough; Headache; and Sore Throat   HPI Cough and congestion and headache and sore throat Patient has been having cough and congestion and headache and sore throat this been going on for the past 4 days. She feels generalized body aches as well. She feels like she's been feverish and hot and cold but has not had any elevated temperatures that she knows of. She does feel like she is having a little bit of wheezing but denies any shortness of breath. She is still smoking and is thinking about quitting but is not ready yet. She has a lot of sinus headache and congestion as well with this. The cough has been keeping her up at night especially.  Relevant past medical, surgical, family and social history reviewed and updated as indicated. Interim medical history since our last visit reviewed. Allergies and medications reviewed and updated.  Review of Systems  Constitutional: Positive for chills. Negative for fever.  HENT: Positive for congestion, postnasal drip, rhinorrhea, sinus pressure and sore throat. Negative for ear discharge, ear pain and sneezing.   Eyes: Negative for pain, redness and visual disturbance.  Respiratory: Positive for cough and wheezing. Negative for chest tightness and shortness of breath.   Cardiovascular: Negative for chest pain and leg swelling.  Genitourinary: Negative for difficulty urinating and dysuria.  Musculoskeletal: Negative for back pain and gait problem.  Skin: Negative for rash.  Neurological: Negative for light-headedness and headaches.  Psychiatric/Behavioral: Negative for agitation and behavioral problems.  All other systems  reviewed and are negative.   Per HPI unless specifically indicated above     Objective:    BP 139/85   Pulse 98   Temp 98 F (36.7 C) (Oral)   Ht 5\' 2"  (1.575 m)   Wt 241 lb 8 oz (109.5 kg)   BMI 44.17 kg/m   Wt Readings from Last 3 Encounters:  11/10/16 241 lb 8 oz (109.5 kg)  09/06/16 245 lb 9.6 oz (111.4 kg)  06/14/16 236 lb (107 kg)    Physical Exam  Constitutional: She is oriented to person, place, and time. She appears well-developed and well-nourished. No distress.  HENT:  Right Ear: Tympanic membrane, external ear and ear canal normal.  Left Ear: Tympanic membrane, external ear and ear canal normal.  Nose: Mucosal edema and rhinorrhea present. No epistaxis. Right sinus exhibits no maxillary sinus tenderness and no frontal sinus tenderness. Left sinus exhibits no maxillary sinus tenderness and no frontal sinus tenderness.  Mouth/Throat: Uvula is midline and mucous membranes are normal. Posterior oropharyngeal edema and posterior oropharyngeal erythema present. No oropharyngeal exudate or tonsillar abscesses.  Eyes: Conjunctivae are normal.  Cardiovascular: Normal rate, regular rhythm, normal heart sounds and intact distal pulses.   No murmur heard. Pulmonary/Chest: Effort normal and breath sounds normal. No respiratory distress. She has no wheezes. She has no rales.  Musculoskeletal: Normal range of motion. She exhibits no edema or tenderness.  Neurological: She is alert and oriented to person, place, and time. Coordination normal.  Skin: Skin is warm and dry. No  rash noted. She is not diaphoretic.  Psychiatric: She has a normal mood and affect. Her behavior is normal.  Nursing note and vitals reviewed.   Influenza: Negative    Assessment & Plan:   Problem List Items Addressed This Visit    None    Visit Diagnoses    Acute bronchitis, unspecified organism    -  Primary   Relevant Medications   cephALEXin (KEFLEX) 500 MG capsule   Other Relevant Orders    Veritor Flu A/B Waived       Follow up plan: Return if symptoms worsen or fail to improve.  Counseling provided for all of the vaccine components Orders Placed This Encounter  Procedures  . Veritor Flu A/B [place], MD [facility] 11/10/2016, 10:36 AM

## 2016-12-14 ENCOUNTER — Telehealth: Payer: Self-pay | Admitting: Family Medicine

## 2016-12-14 ENCOUNTER — Ambulatory Visit (INDEPENDENT_AMBULATORY_CARE_PROVIDER_SITE_OTHER): Payer: Self-pay | Admitting: Family Medicine

## 2016-12-14 ENCOUNTER — Encounter: Payer: Self-pay | Admitting: Family Medicine

## 2016-12-14 VITALS — BP 129/89 | HR 92 | Temp 97.2°F | Ht 62.0 in | Wt 244.4 lb

## 2016-12-14 DIAGNOSIS — J0110 Acute frontal sinusitis, unspecified: Secondary | ICD-10-CM

## 2016-12-14 MED ORDER — PREDNISONE 10 MG PO TABS: ORAL_TABLET | ORAL | 0 refills | Status: DC

## 2016-12-14 MED ORDER — SULFAMETHOXAZOLE-TRIMETHOPRIM 800-160 MG PO TABS
1.0000 | ORAL_TABLET | Freq: Two times a day (BID) | ORAL | 0 refills | Status: DC
Start: 2016-12-14 — End: 2017-01-03

## 2016-12-14 NOTE — Progress Notes (Signed)
Subjective:  Patient ID: Lisa Norton, female    DOB: Sep 11, 1970  Age: 46 y.o. MRN: [id number]  CC: Sinusitis (sinus congestion, drainage, pressure; headache, sneezing; symptoms began 3 days ago) and Cough (taking Alka Seltzer Severe Cold, OTC cough syrup)   HPI Lisa Norton presents for Symptoms include congestion, facial pain, nasal congestion, non productive cough, post nasal drip and sinus pressure. There is no fever, chills, or sweats. Onset of symptoms was a few days ago, gradually worsening since that time.    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   She has a past surgical history that includes Cholecystectomy (2006); Tonsillectomy (1988); Cervical fusion; and Cesarean section with bilateral tubal ligation (Bilateral, 02/12/2015).   Her family history includes Heart disease in her mother; Narcolepsy in her mother.She reports that she has been smoking Cigarettes.  She has a 10.00 pack-year smoking history. She quit smokeless tobacco use about 30 years ago. Her smokeless tobacco use included Snuff. She reports that she does not drink alcohol or use drugs.  Current Outpatient Prescriptions on File Prior to Visit  Medication Sig Dispense Refill  . clonazePAM (KLONOPIN) 1 MG tablet Take 1 mg by mouth 2 (two) times daily.    Marland Kitchen FLUoxetine (PROZAC) 40 MG capsule Take 40 mg by mouth 2 (two) times daily.    Marland Kitchen glimepiride (AMARYL) 2 MG tablet Take 1 tablet (2 mg total) by mouth daily with breakfast. 30 tablet 11  . naproxen (NAPROSYN) 500 MG tablet Take 1 tablet (500 mg total) by mouth 2 (two) times daily with a meal. 60 tablet 1  . ranitidine (ZANTAC) 75 MG tablet Take 75 mg by mouth 2 (two) times daily.     No current facility-administered medications on file prior to visit.      ROS Review of Systems  Constitutional: Negative for activity change, appetite change, chills and fever.  HENT: Positive for congestion, postnasal drip, rhinorrhea and sinus pressure. Negative for ear discharge, ear pain, hearing loss, nosebleeds, sneezing and trouble swallowing.   Respiratory: Positive for cough. Negative for chest tightness and shortness of breath.   Cardiovascular: Negative for chest pain and palpitations.  Skin: Negative for rash.    Objective:  BP 129/89   Pulse 92   Temp 97.2 F (36.2 C) (Oral)   Ht 5\' 2"  (1.575 m)   Wt 244 lb 6 oz (110.8 kg)   BMI 44.70 kg/m   Physical Exam  Constitutional: She appears well-developed and well-nourished.  HENT:  Head: Normocephalic and atraumatic.  Right Ear: Tympanic membrane and external ear normal. No decreased hearing is noted.  Left Ear: Tympanic membrane and external ear normal. No decreased hearing is noted.  Nose: Mucosal edema present. Right sinus exhibits frontal sinus tenderness. Right sinus exhibits no maxillary sinus tenderness. Left sinus exhibits frontal sinus tenderness. Left sinus exhibits no maxillary sinus tenderness.  Mouth/Throat: No oropharyngeal exudate or posterior oropharyngeal erythema.  Neck: No Brudzinski's sign noted.  Pulmonary/Chest: Breath sounds normal. No respiratory distress.  Lymphadenopathy:       Head (right side): No preauricular adenopathy present.       Head (left side): No preauricular adenopathy present.       Right cervical: No superficial cervical adenopathy present.      Left cervical: No superficial cervical adenopathy present.    Assessment & Plan:   Keyaira  was seen today for sinusitis and cough.  Diagnoses and all orders for this visit:  Acute frontal sinusitis, recurrence not specified  Other orders -     sulfamethoxazole-trimethoprim (BACTRIM DS,SEPTRA DS) 800-160 MG tablet; Take 1 tablet by mouth 2 (two) times daily. Until gone, for infection -     predniSONE  (DELTASONE) 10 MG tablet; Take 5 daily for 2 days followed by 4,3,2 and 1 for 2 days each.   I have discontinued Ms. Olano's cephALEXin. I am also having her start on sulfamethoxazole-trimethoprim and predniSONE. Additionally, I am having her maintain her clonazePAM, FLUoxetine, glimepiride, ranitidine, and naproxen.  Meds ordered this encounter  Medications  . sulfamethoxazole-trimethoprim (BACTRIM DS,SEPTRA DS) 800-160 MG tablet    Sig: Take 1 tablet by mouth 2 (two) times daily. Until gone, for infection    Dispense:  20 tablet    Refill:  0  . predniSONE (DELTASONE) 10 MG tablet    Sig: Take 5 daily for 2 days followed by 4,3,2 and 1 for 2 days each.    Dispense:  30 tablet    Refill:  0     Follow-up: Return if symptoms worsen or fail to improve.  Claretta Fraise, M.D.

## 2016-12-14 NOTE — Telephone Encounter (Signed)
In order to consider this patient request, the patient will need to see a provider 

## 2016-12-14 NOTE — Telephone Encounter (Signed)
Patient called with complaints of cough, nasal congestion, sinus pressure, headache starting Sunday.  Patient has been taking OTC medication with no relief and would like a Rx sent to [facility]

## 2016-12-14 NOTE — Telephone Encounter (Signed)
What symptoms do you have? Congestion, cough, nose  How long have you been sick? Since sunday  Have you been seen for this problem? no  If your provider decides to give you a prescription, which pharmacy would you like for it to be sent to? [facility] in [place].   Patient informed that this information will be sent to the clinical staff for review and that they should receive a follow up call.

## 2016-12-14 NOTE — Telephone Encounter (Signed)
appt made to be seen  

## 2016-12-24 ENCOUNTER — Encounter: Payer: Self-pay | Admitting: Family Medicine

## 2016-12-27 MED ORDER — FLUOXETINE HCL 40 MG PO CAPS: 40.0000 mg | ORAL_CAPSULE | Freq: Two times a day (BID) | ORAL | 0 refills | Status: DC

## 2016-12-27 MED ORDER — RANITIDINE HCL 75 MG PO TABS: 75.0000 mg | ORAL_TABLET | Freq: Two times a day (BID) | ORAL | 0 refills | Status: DC

## 2016-12-27 MED ORDER — CLONAZEPAM 1 MG PO TABS: 1.0000 mg | ORAL_TABLET | Freq: Two times a day (BID) | ORAL | 0 refills | Status: DC

## 2016-12-27 NOTE — Telephone Encounter (Signed)
Authorize 30 days only. Then contact the patient letting them know that they will need an appointment before any further prescriptions can be sent in. 

## 2016-12-27 NOTE — Telephone Encounter (Signed)
Refill called to [facility] 

## 2017-01-03 ENCOUNTER — Encounter: Payer: Self-pay | Admitting: Pediatrics

## 2017-01-03 ENCOUNTER — Ambulatory Visit (INDEPENDENT_AMBULATORY_CARE_PROVIDER_SITE_OTHER): Payer: Self-pay | Admitting: Pediatrics

## 2017-01-03 VITALS — BP 137/81 | HR 87 | Temp 97.6°F | Ht 62.0 in | Wt 244.4 lb

## 2017-01-03 DIAGNOSIS — K219 Gastro-esophageal reflux disease without esophagitis: Secondary | ICD-10-CM

## 2017-01-03 DIAGNOSIS — R1030 Lower abdominal pain, unspecified: Secondary | ICD-10-CM

## 2017-01-03 DIAGNOSIS — F39 Unspecified mood [affective] disorder: Secondary | ICD-10-CM

## 2017-01-03 DIAGNOSIS — N941 Unspecified dyspareunia: Secondary | ICD-10-CM

## 2017-01-03 MED ORDER — RANITIDINE HCL 75 MG PO TABS: 75.0000 mg | ORAL_TABLET | Freq: Two times a day (BID) | ORAL | 2 refills | Status: DC

## 2017-01-03 MED ORDER — CITALOPRAM HYDROBROMIDE 20 MG PO TABS: 20.0000 mg | ORAL_TABLET | Freq: Every day | ORAL | 3 refills | Status: DC

## 2017-01-03 NOTE — Progress Notes (Signed)
  Subjective:   Patient ID: Lisa Norton, female    DOB: Jul 14, 1971, 46 y.o.   MRN: [id number] CC: Vaginal Pain (Notice knot last week by Fiance)  HPI: Lisa Norton is a 46 y.o. female presenting for Vaginal Pain (Notice knot last week by [facility])  Coughed about a month ago and felt something pop in lower abd hasnt hurt all the time since then Sometimes with valsalva has the same pain  in L vaginal area fiance has noticed a knot Some discomfort in abd with intercourse  No fevers No vaginal discharge No new sexual partners Doesn't want testing done as without insurance now  Depression:  Ongoing symptoms In bed  zoloft tried in the past, made her feel bad  Reflux symptoms ongoing off of ranitidine, wants to restart  Relevant past medical, surgical, family and social history reviewed. Allergies and medications reviewed and updated. History  Smoking Status  . Current Every Day Smoker  . Packs/day: 0.50  . Years: 20.00  . Types: Cigarettes  Smokeless Tobacco  . Former Systems developer  . Types: Snuff  . Quit date: 05/05/1986   ROS: Per HPI   Objective:    BP 137/81   Pulse 87   Temp 97.6 F (36.4 C) (Oral)   Ht 5\' 2"  (1.575 m)   Wt 244 lb 6.4 oz (110.9 kg)   BMI 44.70 kg/m   Wt Readings from Last 3 Encounters:  01/03/17 244 lb 6.4 oz (110.9 kg)  12/14/16 244 lb 6 oz (110.8 kg)  11/10/16 241 lb 8 oz (109.5 kg)    Gen: NAD, alert, cooperative with exam, NCAT EYES: EOMI, no conjunctival injection, or no icterus ENT:   OP without erythema LYMPH: no cervical LAD CV: NRRR, normal S1/S2, no murmur, distal pulses 2+ b/l Resp: CTABL, no wheezes, normal WOB Abd: +BS, soft, obese, mildly tender throughout with palpation, ND.  Ext: No edema, warm Neuro: Alert and oriented GU: nl ext female genitalia, normal appearing cervix, scant vaginal discharge present Minimal cmt present, some L adenexal tenderness with palpation, no masses  Chaperone present throughout the exam  Assessment  & Plan:  [name] was seen today for vaginal pain.  Diagnoses and all orders for this visit:  Gastroesophageal reflux disease, esophagitis presence not specified Ongoing symptoms, restart below, contorlled symptoms in past -     ranitidine (ZANTAC) 75 MG tablet; Take 1 tablet (75 mg total) by mouth 2 (two) times daily.  Mood disorder ([place]) Ongoing symptoms, stop prozac  start celexa If not improving let me know -     citalopram (CELEXA) 20 MG tablet; Take 1 tablet (20 mg total) by mouth daily.  Lower abdominal pain preg test neg at home, declined test here due to being self pay If pain continues will need test/imaging  Dyspareunia in female   Follow up plan: Return in about 4 weeks (around 01/31/2017). Assunta Found, MD [place]

## 2017-01-03 NOTE — Patient Instructions (Signed)
Half tab citalopram for 8 days then full tab

## 2017-02-03 ENCOUNTER — Ambulatory Visit: Payer: Self-pay | Admitting: Pediatrics

## 2017-02-04 ENCOUNTER — Ambulatory Visit: Payer: Self-pay | Admitting: Family Medicine

## 2017-02-04 ENCOUNTER — Telehealth: Payer: Self-pay | Admitting: Family Medicine

## 2017-02-04 ENCOUNTER — Encounter: Payer: Self-pay | Admitting: Family Medicine

## 2017-03-03 ENCOUNTER — Encounter: Payer: Self-pay | Admitting: Pediatrics

## 2017-03-11 ENCOUNTER — Ambulatory Visit (INDEPENDENT_AMBULATORY_CARE_PROVIDER_SITE_OTHER): Payer: Medicaid Other | Admitting: Pediatrics

## 2017-03-11 ENCOUNTER — Encounter: Payer: Self-pay | Admitting: Pediatrics

## 2017-03-11 VITALS — BP 118/77 | HR 73 | Temp 97.7°F | Ht 62.0 in | Wt 242.8 lb

## 2017-03-11 DIAGNOSIS — M7989 Other specified soft tissue disorders: Secondary | ICD-10-CM

## 2017-03-11 DIAGNOSIS — E1169 Type 2 diabetes mellitus with other specified complication: Secondary | ICD-10-CM | POA: Diagnosis not present

## 2017-03-11 DIAGNOSIS — E785 Hyperlipidemia, unspecified: Secondary | ICD-10-CM

## 2017-03-11 DIAGNOSIS — G629 Polyneuropathy, unspecified: Secondary | ICD-10-CM | POA: Diagnosis not present

## 2017-03-11 DIAGNOSIS — E1165 Type 2 diabetes mellitus with hyperglycemia: Secondary | ICD-10-CM | POA: Diagnosis not present

## 2017-03-11 LAB — BAYER DCA HB A1C WAIVED: HB A1C (BAYER DCA - WAIVED): 7.9 % — ABNORMAL HIGH (ref <7.0)

## 2017-03-11 MED ORDER — SITAGLIPTIN PHOSPHATE 100 MG PO TABS: 100.0000 mg | ORAL_TABLET | Freq: Every day | ORAL | 3 refills | Status: DC

## 2017-03-11 MED ORDER — GABAPENTIN 100 MG PO CAPS: 100.0000 mg | ORAL_CAPSULE | Freq: Three times a day (TID) | ORAL | 3 refills | Status: DC

## 2017-03-11 NOTE — Progress Notes (Signed)
  Subjective:   Patient ID: Lisa Norton, female    DOB: 25-Jun-1971, 46 y.o.   MRN: [id number] CC: Follow-up  HPI: Lisa Norton is a 46 y.o. female presenting for Follow-up  DM2: fasting BGL in AM in low 200s Drinking 2 cups of reg pepsi a day, decreasing amount  Sometimes has swelling in her feet when she is on her feet  Feet and legs can get quite swollen, sometimes painful, improves when she props them up  Neuropathy: gabapentin helps with symptoms  HLD: on statin  Relevant past medical, surgical, family and social history reviewed. Allergies and medications reviewed and updated. History  Smoking Status  . Current Every Day Smoker  . Packs/day: 0.50  . Years: 20.00  . Types: Cigarettes  Smokeless Tobacco  . Former Systems developer  . Types: Snuff  . Quit date: 05/05/1986   ROS: Per HPI   Objective:    BP 118/77   Pulse 73   Temp 97.7 F (36.5 C) (Oral)   Ht '5\' 2"'$  (1.575 m)   Wt 242 lb 12.8 oz (110.1 kg)   BMI 44.41 kg/m   Wt Readings from Last 3 Encounters:  03/11/17 242 lb 12.8 oz (110.1 kg)  01/03/17 244 lb 6.4 oz (110.9 kg)  12/14/16 244 lb 6 oz (110.8 kg)    Gen: NAD, alert, cooperative with exam, NCAT EYES: EOMI, no conjunctival injection, or no icterus ENT:   OP without erythema LYMPH: no cervical LAD CV: NRRR, normal S1/S2, no murmur, distal pulses 2+ b/l Resp: CTABL, no wheezes, normal WOB Abd: +BS, soft, NTND. Ext: No pitting edema, warm Neuro: Alert and oriented, strength equal b/l UE and LE, coordination grossly normal  Assessment & Plan:  [name] was seen today for follow-up multiple med problems.  Diagnoses and all orders for this visit:  Type 2 diabetes mellitus with hyperglycemia, unspecified whether long term insulin use (HCC) Trying to lose weight Will stop glimeperide Start januvia hasnt tolerated metformin in the past Check BGLs every morning fasting and before dinner several times a week If AM BGLs regularly over 150 let me know -      CMP14+EGFR -     Bayer DCA Hb A1c Waived -     sitaGLIPtin (JANUVIA) 100 MG tablet; Take 1 tablet (100 mg total) by mouth daily.  Neuropathy improved with below -     gabapentin (NEURONTIN) 100 MG capsule; Take 1 capsule (100 mg total) by mouth 3 (three) times daily.  Hyperlipidemia associated with type 2 diabetes mellitus ([place]) On pravastatin, pt istn sure what dose, will call us back with dose -     Lipid Panel  Leg swelling No swelling today Worse when she is on her legs for long periods Cont regular exercise  Follow up plan: Return in about 4 weeks (around 04/08/2017) for med follow up. Assunta Found, MD [place]

## 2017-03-12 LAB — CMP14+EGFR
ALBUMIN: 3.7 g/dL (ref 3.5–5.5)
ALT: 11 IU/L (ref 0–32)
AST: 14 IU/L (ref 0–40)
Albumin/Globulin Ratio: 1.4 (ref 1.2–2.2)
Alkaline Phosphatase: 97 IU/L (ref 39–117)
BUN / CREAT RATIO: 23 (ref 9–23)
BUN: 18 mg/dL (ref 6–24)
Bilirubin Total: 0.3 mg/dL (ref 0.0–1.2)
CO2: 21 mmol/L (ref 20–29)
CREATININE: 0.78 mg/dL (ref 0.57–1.00)
Calcium: 8.8 mg/dL (ref 8.7–10.2)
Chloride: 102 mmol/L (ref 96–106)
GFR calc non Af Amer: 93 mL/min/{1.73_m2} (ref >59)
GFR, EST AFRICAN AMERICAN: 107 mL/min/{1.73_m2} (ref >59)
GLOBULIN, TOTAL: 2.6 g/dL (ref 1.5–4.5)
GLUCOSE: 201 mg/dL — AB (ref 65–99)
Potassium: 4.5 mmol/L (ref 3.5–5.2)
SODIUM: 137 mmol/L (ref 134–144)
TOTAL PROTEIN: 6.3 g/dL (ref 6.0–8.5)

## 2017-03-14 ENCOUNTER — Other Ambulatory Visit: Payer: Self-pay | Admitting: Family Medicine

## 2017-03-14 DIAGNOSIS — E1165 Type 2 diabetes mellitus with hyperglycemia: Secondary | ICD-10-CM

## 2017-03-14 NOTE — Telephone Encounter (Signed)
There is no formulary information on the chart so I'm not sure what we can substitute. Because of that we'll need to appeal/try for prior authorization. Thanks, WS.

## 2017-03-17 ENCOUNTER — Encounter: Payer: Self-pay | Admitting: Pediatrics

## 2017-03-21 ENCOUNTER — Encounter: Payer: Self-pay | Admitting: Pediatrics

## 2017-03-23 ENCOUNTER — Encounter: Payer: Self-pay | Admitting: Pediatrics

## 2017-03-23 MED ORDER — DAPAGLIFLOZIN PROPANEDIOL 5 MG PO TABS: 5.0000 mg | ORAL_TABLET | Freq: Every day | ORAL | 3 refills | Status: DC

## 2017-04-08 ENCOUNTER — Encounter: Payer: Self-pay | Admitting: Pediatrics

## 2017-04-08 ENCOUNTER — Ambulatory Visit (INDEPENDENT_AMBULATORY_CARE_PROVIDER_SITE_OTHER): Payer: Medicaid Other | Admitting: Pediatrics

## 2017-04-08 VITALS — BP 122/77 | HR 75 | Temp 98.3°F | Ht 62.0 in | Wt 237.6 lb

## 2017-04-08 DIAGNOSIS — M7661 Achilles tendinitis, right leg: Secondary | ICD-10-CM | POA: Diagnosis not present

## 2017-04-08 DIAGNOSIS — K219 Gastro-esophageal reflux disease without esophagitis: Secondary | ICD-10-CM | POA: Diagnosis not present

## 2017-04-08 DIAGNOSIS — R400 Somnolence: Secondary | ICD-10-CM

## 2017-04-08 DIAGNOSIS — M722 Plantar fascial fibromatosis: Secondary | ICD-10-CM

## 2017-04-08 DIAGNOSIS — M7662 Achilles tendinitis, left leg: Secondary | ICD-10-CM

## 2017-04-08 MED ORDER — RANITIDINE HCL 75 MG PO TABS: 75.0000 mg | ORAL_TABLET | Freq: Two times a day (BID) | ORAL | 2 refills | Status: DC

## 2017-04-08 MED ORDER — NAPROXEN 500 MG PO TABS: 500.0000 mg | ORAL_TABLET | Freq: Two times a day (BID) | ORAL | 1 refills | Status: DC

## 2017-04-08 NOTE — Progress Notes (Signed)
  Subjective:   Patient ID: Lisa Norton, female    DOB: 1971/02/15, 46 y.o.   MRN: [id number] CC: Follow-up multiple med problems   HPI: Lisa Norton is a 46 y.o. female presenting for Follow-up  GERD: zantac helping with symptoms, takes every day  Depression: mood has been ok Not on any medication now  Insomnia: wakes up frequently throughout the night Daytime somnolence, can fall asleep if sitting for a couple minutes, sometimes even in conversations  HA in the morning  Foot pain: h/o plantar fasciitis, hurting more last few weeks, ongoing problem for several years  Heel pain: ongoing pain back of both of her heels Wearing shoes with backs on them hurts Walking makes pain worse Past few weeks pain has been increased  Relevant past medical, surgical, family and social history reviewed. Allergies and medications reviewed and updated. History  Smoking Status  . Current Every Day Smoker  . Packs/day: 0.50  . Years: 20.00  . Types: Cigarettes  Smokeless Tobacco  . Former Systems developer  . Types: Snuff  . Quit date: 05/05/1986   ROS: Per HPI   Objective:    BP 122/77   Pulse 75   Temp 98.3 F (36.8 C) (Oral)   Ht 5\' 2"  (1.575 m)   Wt 237 lb 9.6 oz (107.8 kg)   BMI 43.46 kg/m   Wt Readings from Last 3 Encounters:  04/08/17 237 lb 9.6 oz (107.8 kg)  03/11/17 242 lb 12.8 oz (110.1 kg)  01/03/17 244 lb 6.4 oz (110.9 kg)    Gen: NAD, alert, cooperative with exam, NCAT EYES: EOMI, no conjunctival injection, or no icterus CV: NRRR, normal S1/S2, no murmur, distal pulses 2+ b/l Resp: CTABL, no wheezes, normal WOB Abd: +BS, soft, NTND.= Ext: No edema, warm Neuro: Alert and oriented MSK: slight swelling b/l achilles, ttp b/l achilles, no redness, pain with passive dorsiflexion of b/l ankles beyond 90 degrees Pain medial heel with palpation  Assessment & Plan:  Calinda was seen today for follow-up multiple med problems  Diagnoses and all orders for this visit:  Daytime  somnolence Referral for sleep apnea eval -     Ambulatory referral to Neurology  Plantar fasciitis NSAIDs, rest, stretching, better arch support -     naproxen (NAPROSYN) 500 MG tablet; Take 1 tablet (500 mg total) by mouth 2 (two) times daily with a meal. -     Ambulatory referral to Sports Medicine  Gastroesophageal reflux disease, esophagitis presence not specified Stable, cont below -     ranitidine (ZANTAC) 75 MG tablet; Take 1 tablet (75 mg total) by mouth 2 (two) times daily.  Achilles tendonitis, bilateral Gentle stretching, NSAIDs, ice, rest -     Ambulatory referral to Sports Medicine  Follow up plan: Return in about 4 weeks (around 05/06/2017). Assunta Found, MD [place]

## 2017-04-08 NOTE — Patient Instructions (Signed)
Try melatonin 5-10 mg 30 min before bed

## 2017-04-19 ENCOUNTER — Ambulatory Visit (INDEPENDENT_AMBULATORY_CARE_PROVIDER_SITE_OTHER): Payer: Medicaid Other | Admitting: Sports Medicine

## 2017-04-19 ENCOUNTER — Encounter: Payer: Self-pay | Admitting: Pediatrics

## 2017-04-19 DIAGNOSIS — M722 Plantar fascial fibromatosis: Secondary | ICD-10-CM | POA: Diagnosis not present

## 2017-04-19 NOTE — Progress Notes (Signed)
Chief complaint: Bilateral heel pain x "several years"  History of present illness: Elanna is a 46 year old female who presents to the sports medicine office today with chief complaint of bilateral heel pain. She reports that symptoms have been ongoing for several years, quantifies it for at least 5 years, but reports over the last 2 years symptoms have been progressively worsening. She points to having pain on the plantar aspect of both of her feet, extending over the medial aspect of the plantar portion of her feet. She reports that her right foot bothers her more so than her left foot. She reports initially when symptoms started progressively worsening two years ago she saw a local physician and she was placed in a boot. She reports no interval improvement in her symptoms. She reports pain throughout the whole entire duration of the day, no particular point in time symptoms bother her. She reports a throbbing and aching pain, currently rates it as a 9/10 pain, any type of standing or going up or down stairs or aggravating factors. She has not tried any medications for relief of symptoms. Only rest provides relief of symptoms. She does not report of any radiation of pain. She does not report of any warmth, erythema, ecchymosis, or effusion in her feet or ankles. She reports that symptoms are becoming so severe that she has to walk on the size of her feet and has to walk down stairs sideways.  Past medical history, past surgical history, family history and social history reviewed with patient today, above history notable for type 2 diabetes with recent A1c of 7.9%, current tobacco use, no previous injury or surgery to her feet.   Allergies: Metformin  Review of systems: As above  Physical exam: Vital signs reviewed and are documented in chart General: Alert, oriented, appears stated age, in no apparent distress HEENT: Moist oral mucosa Respiratory: Normal respirations, able to speak in full  sentences Cardiac: Normal peripheral pulses, regular rate Integumentary: No rashes on visible skin Neurologic: Strength 5/5 in bilateral lower extremities, sensation 2+ in bilateral lower extremities Psychiatric: Appropriate affect and mood Musculoskeletal: Inspection of bilateral feet reveal no obvious deformity or muscular atrophy, no warmth, erythema, ecchymosis, or effusion noted, she is tender to palpation over the plantar aspect of both of her feet, and medial portion, no tenderness on dorsal aspect of feet or on medial or lateral aspect of her feet, no pain over posterior calcaneus or distal Achilles insertion bilaterally, on gait evaluation she does have flexible pronation and has slight pes cavus bilaterally  Limited ultrasound of both of her feet were performed today. Specifically evaluating the plantar fascia, on right side she does have increased size of the plantar fascia, measuring 0.54 mm, larger than the left side. This was specifically viewed on proximal plantar fascia near the calcaneus. Do see slight hypoechogenicity along this region, indicating plantar fascia  Inflammation. Of note, taking a look at her right Achilles, she does have right Achilles tendinosis.  Impression: Bilateral plantar fasciitis, right foot more prominent than left foot  Assessment and plan: 1) Bilateral plantar fasciitis, right greater than left 2) Bilateral flexible pronation 3) Bilateral pes cavus 4) Type 2 diabetes, with most recent A1c last month 7.9% 5) Current tobacco use  Discussed with patient that she does have plantar fasciitis, mainly involving her right foot, with increased size of the plantar fascia as seen on ultrasound. Discussed with her physical therapy exercises for her to do at home, discussed icing 2-3 times a  day for 10-15 minutes each time, will provide green insoles with scaphoid padding. Due to her history of having type 2 diabetes will avoid arch strap. She will return to the  office in 3-4 weeks for follow-up this, or sooner as needed.  Mort Sawyers, MD Primary Care Sports Medicine Fellow [facility]  Patient seen and evaluated with the sports medicine fellow. I agree with the above plan of care.

## 2017-04-19 NOTE — Patient Instructions (Addendum)
Plantar Fasciitis Treatment Plan: 1) Stretching exercises-See sheet 2) Ice heel 2-3 times daily for 20-30 minutes 3) Green insoles with scaphoid padding in both feet

## 2017-04-21 ENCOUNTER — Encounter: Payer: Self-pay | Admitting: Pediatrics

## 2017-04-21 DIAGNOSIS — E119 Type 2 diabetes mellitus without complications: Secondary | ICD-10-CM

## 2017-04-21 MED ORDER — DAPAGLIFLOZIN PROPANEDIOL 10 MG PO TABS: 10.0000 mg | ORAL_TABLET | Freq: Every day | ORAL | 1 refills | Status: DC

## 2017-04-26 ENCOUNTER — Encounter: Payer: Self-pay | Admitting: Pediatrics

## 2017-04-27 ENCOUNTER — Encounter: Payer: Self-pay | Admitting: Pediatrics

## 2017-04-30 ENCOUNTER — Encounter: Payer: Self-pay | Admitting: Pediatrics

## 2017-05-05 ENCOUNTER — Encounter: Payer: Self-pay | Admitting: Pediatrics

## 2017-05-09 ENCOUNTER — Encounter: Payer: Self-pay | Admitting: Pediatrics

## 2017-05-09 ENCOUNTER — Ambulatory Visit (INDEPENDENT_AMBULATORY_CARE_PROVIDER_SITE_OTHER): Payer: Medicaid Other | Admitting: Pediatrics

## 2017-05-09 VITALS — BP 138/78 | HR 88 | Temp 97.9°F | Ht 62.0 in | Wt 238.4 lb

## 2017-05-09 DIAGNOSIS — M79671 Pain in right foot: Secondary | ICD-10-CM

## 2017-05-09 DIAGNOSIS — Z720 Tobacco use: Secondary | ICD-10-CM

## 2017-05-09 DIAGNOSIS — M79672 Pain in left foot: Secondary | ICD-10-CM

## 2017-05-09 DIAGNOSIS — E119 Type 2 diabetes mellitus without complications: Secondary | ICD-10-CM

## 2017-05-09 MED ORDER — TRAMADOL HCL 50 MG PO TABS: 25.0000 mg | ORAL_TABLET | Freq: Three times a day (TID) | ORAL | 0 refills | Status: DC | PRN

## 2017-05-09 MED ORDER — PRAVASTATIN SODIUM 40 MG PO TABS: 40.0000 mg | ORAL_TABLET | Freq: Every day | ORAL | 5 refills | Status: DC

## 2017-05-09 MED ORDER — VARENICLINE TARTRATE 0.5 MG X 11 & 1 MG X 42 PO MISC: ORAL | 0 refills | Status: DC

## 2017-05-09 NOTE — Telephone Encounter (Signed)
Patient states she is on 40mg  of pravastatin not 10mg  is it ok to change

## 2017-05-09 NOTE — Progress Notes (Signed)
  Subjective:   Patient ID: Lisa Norton, female    DOB: 24-May-1971, 46 y.o.   MRN: [id number] CC: Follow-up  HPI: Lisa Norton is a 46 y.o. female presenting for Follow-up  continues to be bothered by pain in her   DM2: BGLs 120s-150s in the morning 90s-140s in the afternoon  Tobacco: 1-1.5 ppd Has started thinking about quitting Not smoking in house Smokes in car  HLD: no s/e from pravastatin  B/l foot pain: ongoing Sometimes crying bc of pain Seen by sports medicine Tried heel support, arch support  Relevant past medical, surgical, family and social history reviewed. Allergies and medications reviewed and updated. History  Smoking Status  . Current Every Day Smoker  . Packs/day: 0.50  . Years: 20.00  . Types: Cigarettes  Smokeless Tobacco  . Former Systems developer  . Types: Snuff  . Quit date: 05/05/1986   ROS: Per HPI   Objective:    BP 138/78   Pulse 88   Temp 97.9 F (36.6 C) (Oral)   Ht 5\' 2"  (1.575 m)   Wt 238 lb 6.4 oz (108.1 kg)   BMI 43.60 kg/m   Wt Readings from Last 3 Encounters:  05/09/17 238 lb 6.4 oz (108.1 kg)  04/19/17 240 lb (108.9 kg)  04/08/17 237 lb 9.6 oz (107.8 kg)    Gen: NAD, alert, cooperative with exam, NCAT EYES: no conjunctival injection, or no icterus ENT:   OP without erythema LYMPH: no cervical LAD CV: NRRR, normal S1/S2, no murmur, distal pulses 2+ b/l Resp: CTABL, no wheezes, normal WOB Abd: +BS, soft, NTND. Ext: No edema, warm Neuro: Alert and oriented, sensation intact b/l feet MSK: no redness heels b/l, tenderness with heel squeeze  Assessment & Plan:  Avo was seen today for follow-up multiple med problems  Diagnoses and all orders for this visit:  Pain of both heels Ongoing, severe per pt Taking NSAIDs daily Trying different supports with sports medicine Trying topical NSAIDs Can try below for severe pain  Will continue to work on weight loss If needs additional Rx needs to be seen and sign contract -      traMADol (ULTRAM) 50 MG tablet; Take 0.5-1 tablets (25-50 mg total) by mouth every 8 (eight) hours as needed.  Type 2 diabetes mellitus without complication, without long-term current use of insulin (HCC) Cont januvia and farxiga BGLs improving Avoiding sodas Recheck A1c next visit  Tobacco use Discussed cessation strategies, trial chantix -     varenicline (CHANTIX STARTING MONTH PAK) 0.5 MG X 11 & 1 MG X 42 tablet; Take one 0.5 mg tablet by mouth once daily for 3 days, then increase to one 0.5 mg tablet twice daily for 4 days, then increase to one 1 mg tablet twice daily.   Follow up plan: Return in about 2 months (around 07/09/2017). Assunta Found, MD [place]

## 2017-05-11 ENCOUNTER — Institutional Professional Consult (permissible substitution): Payer: Medicaid Other | Admitting: Neurology

## 2017-05-16 ENCOUNTER — Ambulatory Visit: Payer: Medicaid Other | Admitting: Sports Medicine

## 2017-05-27 ENCOUNTER — Encounter: Payer: Self-pay | Admitting: Pediatrics

## 2017-05-30 ENCOUNTER — Other Ambulatory Visit: Payer: Self-pay | Admitting: *Deleted

## 2017-05-30 DIAGNOSIS — E119 Type 2 diabetes mellitus without complications: Secondary | ICD-10-CM

## 2017-05-30 MED ORDER — BLOOD GLUCOSE MONITOR KIT: PACK | 0 refills | Status: DC

## 2017-05-30 MED ORDER — TRUE METRIX AIR GLUCOSE METER W/DEVICE KIT
1.0000 | PACK | Freq: Three times a day (TID) | 0 refills | Status: DC
Start: 2017-05-30 — End: 2018-04-21

## 2017-05-31 ENCOUNTER — Telehealth: Payer: Self-pay

## 2017-05-31 NOTE — Telephone Encounter (Signed)
Dr Zeb Comfort patient  Medicaid non preferred True Metrix Air Glucose Meter  Preferred is [facility] [employer] therapist

## 2017-05-31 NOTE — Telephone Encounter (Signed)
Please give scrip to pt. For preferred system.

## 2017-06-10 ENCOUNTER — Ambulatory Visit: Payer: Medicaid Other | Admitting: Sports Medicine

## 2017-06-14 ENCOUNTER — Encounter: Payer: Self-pay | Admitting: Neurology

## 2017-06-14 ENCOUNTER — Ambulatory Visit (INDEPENDENT_AMBULATORY_CARE_PROVIDER_SITE_OTHER): Payer: Medicaid Other | Admitting: Neurology

## 2017-06-14 VITALS — BP 130/80 | HR 80 | Ht 61.0 in | Wt 232.0 lb

## 2017-06-14 DIAGNOSIS — R400 Somnolence: Secondary | ICD-10-CM

## 2017-06-14 DIAGNOSIS — Z6841 Body Mass Index (BMI) 40.0 and over, adult: Secondary | ICD-10-CM | POA: Diagnosis not present

## 2017-06-14 DIAGNOSIS — R519 Headache, unspecified: Secondary | ICD-10-CM

## 2017-06-14 DIAGNOSIS — Z820 Family history of epilepsy and other diseases of the nervous system: Secondary | ICD-10-CM

## 2017-06-14 DIAGNOSIS — R351 Nocturia: Secondary | ICD-10-CM | POA: Diagnosis not present

## 2017-06-14 DIAGNOSIS — R0683 Snoring: Secondary | ICD-10-CM | POA: Diagnosis not present

## 2017-06-14 DIAGNOSIS — R51 Headache: Secondary | ICD-10-CM | POA: Diagnosis not present

## 2017-06-14 NOTE — Patient Instructions (Addendum)
Thank you for choosing [facility] for your sleep related care! It was nice to meet you today! I appreciate that you entrust me with your sleep related healthcare concerns. I hope, I was able to address at least some of your concerns today, and that I can help you feel reassured and also get better.    Here is what we discussed today and what we came up with as our plan for you:    Based on your symptoms and your exam I believe you are at risk for obstructive sleep apnea or OSA, and I think we should proceed with a sleep study to determine whether you do or do not have OSA and how severe it is. If you have more than mild OSA, I want you to consider treatment with CPAP. Please remember, the risks and ramifications of moderate to severe obstructive sleep apnea or OSA are: Cardiovascular disease, including congestive heart failure, stroke, difficult to control hypertension, arrhythmias, and even type 2 diabetes has been linked to untreated OSA. Sleep apnea causes disruption of sleep and sleep deprivation in most cases, which, in turn, can cause recurrent headaches, problems with memory, mood, concentration, focus, and vigilance. Most people with untreated sleep apnea report excessive daytime sleepiness, which can affect their ability to drive. Please do not drive if you feel sleepy.   You may also have an underlying sleepiness condition. This means, that you have a sleep disorder that manifests with excessive sleep and excessive sleepiness during the day.  We will do additional testing for this in the form of an overnight sleep study during which we will monitor your night time sleep and we will do nap study testing the next day: 5 scheduled 20 min nap opportunities, every 2 hours. We will remind you to stay awake in between naps.   As explained, you will have to be off of any caffeine or stimulant or antidepressant medication in preparation for the sleep studies, please refrain from taking  the tramadol and the gabapentin for at least 2 weeks, prior to testing.

## 2017-06-14 NOTE — Progress Notes (Signed)
Subjective:    Patient ID: Lisa Norton is a 46 y.o. female.  HPI     Star Age, MD, PhD [facility] [address], [address] [place], [place] [address]  Dear Dr. Evette Doffing,   I saw your patient, Lisa Norton, upon your kind request in my neurologic clinic today for initial consultation of her sleep disorder, in particular, concern for underlying obstructive sleep apnea. The patient is unaccompanied today. As you know, Lisa Norton is a 46 year old right-handed woman with an underlying medical history of hypertension, depression, diabetes, plantar fasciitis, right hip pain, smoking and morbid obesity with a BMI of over 40, who reports snoring and excessive daytime somnolence. I reviewed your office note from 04/08/2017.  Her Epworth sleepiness score is 17 out of 24 today, fatigue score is 46 out of 63. She complains of difficulty going to sleep and staying asleep at times. She wakes up nonrestorative, just as tired as when she went to bed. She had a tonsillectomy as a child. She lives with her daughter. She is currently not working. She smokes up to 1-1/2 packs per day. She does not currently utilize alcohol on a regular basis. She does not drink caffeine on a regular basis. She reports a family history of narcolepsy in her brother and sister, she reports a mild diagnosis in her siblings and mother also was apparently diagnosed with narcolepsy. In addition, brother and mother have sleep apnea as well. She reports a history of sleepiness for the past 8-10 years, difficulty with sleep maintenance and sleep initiation since she lost her baby in 2016. She has a 22 year old daughter and occasionally stays with her boyfriend, who has mentioned loud snoring to her but no apneas. She denies sleep paralysis or cataplexy type symptoms. She does feel she has vivid dreams at times and feeling like sequences at night when she is awake. She quit drinking soda to help her diabetes.  She does not maintain a very set scheduled for her bedtime, does not watch TV in bed. Bedtime is between 9 and 11, she reports nocturia about once or twice per average night and occasional morning headaches, up to twice per week, typically does not take medication for this. She has gabapentin and tramadol for her foot pain but does not take these on a regular basis. In the past after she lost her baby she was tried on Ambien which did not help and trazodone made her drowsy during the day. She has not tried over-the-counter sleep aids. Wakeup time is around 7.  Her Past Medical History Is Significant For: Past Medical History:  Diagnosis Date  . Anxiety   . COLD SORE 05/15/2010  . DEPRESSION 12/12/2009  . Diabetes mellitus without complication ([place])   . High cholesterol   . 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 HYPERTENSION 12/12/2009  . PREDIABETES 02/11/2010  . TOBACCO ABUSE 12/12/2009    Her Past Surgical History Is Significant For: Past Surgical History:  Procedure Laterality Date  . CERVICAL FUSION    . CESAREAN SECTION WITH BILATERAL TUBAL LIGATION Bilateral 02/12/2015   Procedure: CESAREAN SECTION;  Surgeon: Florian Buff, MD;  Location: [facility];  Service: Obstetrics;  Laterality: Bilateral;  Fetal Demise  . CHOLECYSTECTOMY  2006  . TONSILLECTOMY  1988    Her Family History Is Significant For: Family History  Problem Relation Age of Onset  . Heart disease Mother   . Narcolepsy Mother     Her Social History Is Significant For:  Social History   Social History  . Marital status: Legally Separated    Spouse name: N/A  . Number of children: N/A  . Years of education: N/A   Social History Main Topics  . Smoking status: Current Every Day Smoker    Packs/day: 1.50    Years: 20.00    Types: Cigarettes  . Smokeless tobacco: Former Systems developer    Types: Snuff    Quit date: 05/05/1986  . Alcohol use No  . Drug use: No  . Sexual activity: Not Currently   Other Topics Concern  . None   Social  History Narrative  . None    Her Allergies Are:  Allergies  Allergen Reactions  . Metformin And Related     diarrhea  :   Her Current Medications Are:  Outpatient Encounter Prescriptions as of 06/14/2017  Medication Sig  . blood glucose meter kit and supplies KIT Dispense based on patient and insurance preference. Use up to four times daily as directed. (FOR ICD-9 250.00, 250.01).  . Blood Glucose Monitoring Suppl (TRUE METRIX AIR GLUCOSE METER) w/Device KIT 1 kit by Does not apply route 3 (three) times daily.  . dapagliflozin propanediol (FARXIGA) 10 MG TABS tablet Take 10 mg by mouth daily.  Marland Kitchen gabapentin (NEURONTIN) 100 MG capsule Take 1 capsule (100 mg total) by mouth 3 (three) times daily.  . naproxen (NAPROSYN) 500 MG tablet Take 1 tablet (500 mg total) by mouth 2 (two) times daily with a meal.  . pravastatin (PRAVACHOL) 40 MG tablet Take 1 tablet (40 mg total) by mouth daily.  . ranitidine (ZANTAC) 75 MG tablet Take 1 tablet (75 mg total) by mouth 2 (two) times daily.  . sitaGLIPtin (JANUVIA) 100 MG tablet Take 1 tablet (100 mg total) by mouth daily.  . traMADol (ULTRAM) 50 MG tablet Take 0.5-1 tablets (25-50 mg total) by mouth every 8 (eight) hours as needed.  . varenicline (CHANTIX STARTING MONTH PAK) 0.5 MG X 11 & 1 MG X 42 tablet Take one 0.5 mg tablet by mouth once daily for 3 days, then increase to one 0.5 mg tablet twice daily for 4 days, then increase to one 1 mg tablet twice daily.   No facility-administered encounter medications on file as of 06/14/2017.   :  Review of Systems:  Out of a complete 14 point review of systems, all are reviewed and negative with the exception of these symptoms as listed below: Review of Systems  Neurological:       Pt presents today to discuss her sleep. Pt does endorse snoring but has never had a sleep study.  Epworth Sleepiness Scale 0= would never doze 1= slight chance of dozing 2= moderate chance of dozing 3= high chance of  dozing  Sitting and reading: 2 Watching TV: 3 Sitting inactive in a public place (ex. Theater or meeting): 3 As a passenger in a car for an hour without a break: 3 Lying down to rest in the afternoon: 2 Sitting and talking to someone: 1 Sitting quietly after lunch (no alcohol): 3 In a car, while stopped in traffic: 0 Total: 17     Objective:  Neurological Exam  Physical Exam Physical Examination:   Vitals:   06/14/17 0853  BP: 130/80  Pulse: 80    General Examination: The patient is a very pleasant 46 y.o. female in no acute distress. She appears well-developed and well-nourished and well groomed.   HEENT: Normocephalic, atraumatic, pupils are equal, round and reactive to  light and accommodation. Extraocular tracking is good without limitation to gaze excursion or nystagmus noted but has b/l laze eye (since childhood, per pt). Normal smooth pursuit is noted. Hearing is grossly intact. Face is symmetric with normal facial animation and normal facial sensation. Speech is clear with no dysarthria noted. There is no hypophonia. There is no lip, neck/head, jaw or voice tremor. Neck is supple with full range of passive and active motion. There are no carotid bruits on auscultation. Oropharynx exam reveals: moderate mouth dryness, adequate dental hygiene and mild airway crowding, due to smaller airway entry, redundant soft palate. Mallampati is class I. Tongue protrudes centrally and palate elevates symmetrically. Tonsils are absent. Neck size is 18.5 inches. She has a nearly absent overbite, partials in place.    Chest: Clear to auscultation without wheezing, rhonchi or crackles noted.  Heart: S1+S2+0, regular and normal without murmurs, rubs or gallops noted.   Abdomen: Soft, non-tender and non-distended with normal bowel sounds appreciated on auscultation.  Extremities: There is no pitting edema in the distal lower extremities bilaterally. Pedal pulses are intact.  Skin: Warm and  dry without trophic changes noted. There are no varicose veins.  Musculoskeletal: exam reveals no obvious joint deformities, tenderness or joint swelling or erythema.   Neurologically:  Mental status: The patient is awake, alert and oriented in all 4 spheres. His immediate and remote memory, attention, language skills and fund of knowledge are appropriate. There is no evidence of aphasia, agnosia, apraxia or anomia. Speech is clear with normal prosody and enunciation. Thought process is linear. Mood is normal and affect is normal.  Cranial nerves II - XII are as described above under HEENT exam. In addition: shoulder shrug is normal with equal shoulder height noted. Motor exam: Normal bulk, strength and tone is noted. There is no drift, tremor or rebound. Romberg is negative. Reflexes are 1+ throughout. Fine motor skills and coordination: intact with normal finger taps, normal hand movements, normal rapid alternating patting, normal foot taps and normal foot agility.  Cerebellar testing: No dysmetria or intention tremor on finger to nose testing. Heel to shin is unremarkable on the L and difficult on the R. There is no truncal or gait ataxia.  Sensory exam: intact to light touch, vibration, temperature sense in the upper and lower extremities.  Gait, station and balance: She stands easily. No veering to one side is noted. No leaning to one side is noted. Posture is age-appropriate and stance is narrow based. Gait shows normal stride length and normal pace. No problems turning are noted. Tandem walk is unremarkable.   Assessment and Plan:  In summary, DENITRA DONAGHEY is a very pleasant 46 y.o.-year old female with an underlying medical history of hypertension, depression, diabetes, plantar fasciitis, right hip pain, smoking and morbid obesity with a BMI of over 40, whose history and physical exam are concerning for obstructive sleep apnea (OSA). While she does not have a telltale Hx of narcolepsy, she  reports a FHx of narcolepsy and given her sleepiness, we will evaluate with extended sleep study testing.   I had a long chat with the patient about my findings and the diagnosis of OSA, its prognosis and treatment options. We talked about medical treatments, surgical interventions and non-pharmacological approaches. I explained in particular the risks and ramifications of untreated moderate to severe OSA, especially with respect to developing cardiovascular disease down the Road, including congestive heart failure, difficult to treat hypertension, cardiac arrhythmias, or stroke. Even type 2 diabetes  has, in part, been linked to untreated OSA. Symptoms of untreated OSA include daytime sleepiness, memory problems, mood irritability and mood disorder such as depression and anxiety, lack of energy, as well as recurrent headaches, especially morning headaches. We talked about smoking cessation and trying to maintain a healthy lifestyle in general, as well as the importance of weight control. I encouraged the patient to eat healthy, exercise daily and keep well hydrated, to keep a scheduled bedtime and wake time routine, to not skip any meals and eat healthy snacks in between meals. I advised the patient not to drive when feeling sleepy. I recommended the following at this time: sleep study with potential positive airway pressure titration. (We will score hypopneas at 4%). We will do a next day MSLT, but if she has sleep apnea, we will forego the MSLT/nap study. She is advised to stay off the gabapentin and tramadol in preparation for sleep study testing.   I explained the sleep test procedures to the patient and also outlined possible surgical and non-surgical treatment options of OSA, including the use of a custom-made dental device (which would require a referral to a specialist dentist or oral surgeon), upper airway surgical options, such as pillar implants, radiofrequency surgery, tongue base surgery, and UPPP  (which would involve a referral to an ENT surgeon). Rarely, jaw surgery such as mandibular advancement may be considered.  I also explained the CPAP treatment option to the patient, who indicated that she would be willing to try CPAP if the need arises. I explained the importance of being compliant with PAP treatment, not only for insurance purposes but primarily to improve Her symptoms, and for the patient's long term health benefit, including to reduce Her cardiovascular risks. I answered all her questions today and the patient was in agreement. I would like to see her back after the sleep study is completed and encouraged her to call with any interim questions, concerns, problems or updates.   Thank you very much for allowing me to participate in the care of this nice patient. If I can be of any further assistance to you please do not hesitate to call me at [phone].  Sincerely,   Star Age, MD, PhD

## 2017-06-16 ENCOUNTER — Ambulatory Visit (INDEPENDENT_AMBULATORY_CARE_PROVIDER_SITE_OTHER): Payer: Medicaid Other | Admitting: Sports Medicine

## 2017-06-16 VITALS — BP 124/82 | Ht 61.5 in | Wt 230.0 lb

## 2017-06-16 DIAGNOSIS — M722 Plantar fascial fibromatosis: Secondary | ICD-10-CM | POA: Diagnosis not present

## 2017-06-16 NOTE — Progress Notes (Signed)
   Subjective:    Patient ID: MECKENZIE BALSLEY, female    DOB: December 01, 1970, 46 y.o.   MRN: [id number]  HPI   Patient comes in today for follow-up on plantar fasciitis. Pain persists despite treatment. Orthotics made her feet hurt worse. She still localizes her pain to the heel.   Review of Systems As above    Objective:   Physical Exam  Obese. No acute distress. Vital signs reviewed. Awake alert and oriented 3.  Examination of both feet show tenderness to palpation at the calcaneal origin of plantar fascial. Negative calcaneal squeeze. Neurovascular intact distally. Walking with a limp.      Assessment & Plan:   Persistent bilateral heel pain secondary to plantar fasciitis  Patient will be referred to Dr. Daylene Katayama for further workup and treatment. Follow-up with me as needed.

## 2017-06-29 ENCOUNTER — Ambulatory Visit (INDEPENDENT_AMBULATORY_CARE_PROVIDER_SITE_OTHER): Payer: Medicaid Other | Admitting: Pediatrics

## 2017-06-29 ENCOUNTER — Other Ambulatory Visit: Payer: Self-pay | Admitting: Pediatrics

## 2017-06-29 ENCOUNTER — Ambulatory Visit (INDEPENDENT_AMBULATORY_CARE_PROVIDER_SITE_OTHER): Payer: Medicaid Other | Admitting: Podiatry

## 2017-06-29 ENCOUNTER — Ambulatory Visit (INDEPENDENT_AMBULATORY_CARE_PROVIDER_SITE_OTHER): Payer: Medicaid Other

## 2017-06-29 ENCOUNTER — Encounter: Payer: Self-pay | Admitting: Pediatrics

## 2017-06-29 ENCOUNTER — Encounter: Payer: Self-pay | Admitting: Podiatry

## 2017-06-29 VITALS — BP 119/75 | HR 89 | Temp 96.9°F | Ht 61.0 in | Wt 235.0 lb

## 2017-06-29 DIAGNOSIS — Z6841 Body Mass Index (BMI) 40.0 and over, adult: Secondary | ICD-10-CM

## 2017-06-29 DIAGNOSIS — E1165 Type 2 diabetes mellitus with hyperglycemia: Secondary | ICD-10-CM

## 2017-06-29 DIAGNOSIS — M722 Plantar fascial fibromatosis: Secondary | ICD-10-CM

## 2017-06-29 DIAGNOSIS — E119 Type 2 diabetes mellitus without complications: Secondary | ICD-10-CM | POA: Diagnosis not present

## 2017-06-29 DIAGNOSIS — E785 Hyperlipidemia, unspecified: Secondary | ICD-10-CM | POA: Diagnosis not present

## 2017-06-29 DIAGNOSIS — Z23 Encounter for immunization: Secondary | ICD-10-CM | POA: Diagnosis not present

## 2017-06-29 DIAGNOSIS — E1169 Type 2 diabetes mellitus with other specified complication: Secondary | ICD-10-CM | POA: Diagnosis not present

## 2017-06-29 DIAGNOSIS — R6881 Early satiety: Secondary | ICD-10-CM | POA: Diagnosis not present

## 2017-06-29 DIAGNOSIS — M7661 Achilles tendinitis, right leg: Secondary | ICD-10-CM | POA: Diagnosis not present

## 2017-06-29 DIAGNOSIS — K219 Gastro-esophageal reflux disease without esophagitis: Secondary | ICD-10-CM

## 2017-06-29 DIAGNOSIS — M7662 Achilles tendinitis, left leg: Secondary | ICD-10-CM

## 2017-06-29 DIAGNOSIS — M659 Synovitis and tenosynovitis, unspecified: Secondary | ICD-10-CM

## 2017-06-29 DIAGNOSIS — G629 Polyneuropathy, unspecified: Secondary | ICD-10-CM

## 2017-06-29 LAB — BAYER DCA HB A1C WAIVED: HB A1C: 6.3 % (ref <7.0)

## 2017-06-29 MED ORDER — BETAMETHASONE SOD PHOS & ACET 6 (3-3) MG/ML IJ SUSP: 3.0000 mg | Freq: Once | INTRAMUSCULAR | Status: DC

## 2017-06-29 MED ORDER — MELOXICAM 15 MG PO TABS: 15.0000 mg | ORAL_TABLET | Freq: Every day | ORAL | 1 refills | Status: DC

## 2017-06-29 NOTE — Progress Notes (Signed)
  Subjective:   Patient ID: Lisa Norton, female    DOB: 02/04/1971, 46 y.o.   MRN: [id number] CC: Follow up chronic medical problems (irregular periods, hot flashes)  HPI: Lisa Norton is a 46 y.o. female presenting for Follow up chronic medical problems (irregular periods, hot flashes)  DM2:  Decreasing soda intake Tea with sweet and tea Taking meds regualrly  Tobacco use: has not yet started chantix yet Planning on soon  Elevated BMI:  Tries to avoid sugar foods  Seen by podiatry today, had steroid injections b/l feet   Periods now irregular, was 15 days late Sweating off and on at night Weight has been stable, trying to lose weight Foot pain limiting activity Has upcoming sleep study to eval for sleep apnea  Takes a couple of bites and gets full Ongoing for several months to over a year Eats sometimes once a day No abd pain Feels bloated all the time   Relevant past medical, surgical, family and social history reviewed. Allergies and medications reviewed and updated. History  Smoking Status  . Current Every Day Smoker  . Packs/day: 1.50  . Years: 20.00  . Types: Cigarettes  Smokeless Tobacco  . Former Systems developer  . Types: Snuff  . Quit date: 05/05/1986   ROS: Per HPI   Objective:    BP 119/75   Pulse 89   Temp (!) 96.9 F (36.1 C) (Oral)   Ht 5\' 1"  (1.549 m)   Wt 235 lb (106.6 kg)   BMI 44.40 kg/m   Wt Readings from Last 3 Encounters:  06/29/17 235 lb (106.6 kg)  06/16/17 230 lb (104.3 kg)  06/14/17 232 lb (105.2 kg)    Gen: NAD, alert, cooperative with exam, NCAT EYES: no conjunctival injection, or no icterus ENT:  OP without erythema CV: NRRR, normal S1/S2, no murmur, distal pulses 2+ b/l Resp: CTABL, no wheezes, normal WOB Abd: +BS, soft, NTND. no guarding or organomegaly Ext: No edema, warm Neuro: Alert and oriented  Assessment & Plan:  Lisa Norton was seen today for follow up chronic medical problems.  Diagnoses and all orders for this  visit:  Type 2 diabetes mellitus without complication, without long-term current use of insulin ([facility]) A1c 6.8 Recently had eye exam, told to return in 3-4 months for new Rx for glasses -     Bayer DCA Hb A1c Waived  Hyperlipidemia associated with type 2 diabetes mellitus ([place]) On statin -     Lipid panel  Early satiety Feels bloated much of time Having regular stools Ongoing for years Weight stable Trial low fodmaps diet Will get abd [place]  Follow up plan: Return in about 3 months (around 09/29/2017). Assunta Found, MD [place]

## 2017-06-30 ENCOUNTER — Other Ambulatory Visit: Payer: Self-pay | Admitting: Pediatrics

## 2017-06-30 LAB — LIPID PANEL
CHOLESTEROL TOTAL: 176 mg/dL (ref 100–199)
Chol/HDL Ratio: 8.4 ratio — ABNORMAL HIGH (ref 0.0–4.4)
HDL: 21 mg/dL — ABNORMAL LOW (ref >39)
Triglycerides: 404 mg/dL — ABNORMAL HIGH (ref 0–149)

## 2017-06-30 MED ORDER — PRAVASTATIN SODIUM 80 MG PO TABS: 40.0000 mg | ORAL_TABLET | Freq: Every day | ORAL | 5 refills | Status: DC

## 2017-07-01 NOTE — Progress Notes (Signed)
   Subjective: Patient presents today for pain and tenderness in the bilateral arches then began approximately 2 years ago. She reports associated pain that radiates from the arch to the posterior heels. Patient states that it hurts in the morning with the first steps out of bed. She states she has been seen by another podiatrist in the past and has worn a cam boot for treatment. Patient presents today for further treatment and evaluation.   Past Medical History:  Diagnosis Date  . Anxiety   . COLD SORE 05/15/2010  . DEPRESSION 12/12/2009  . Diabetes mellitus without complication ([place])   . High cholesterol   . 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 HYPERTENSION 12/12/2009  . PREDIABETES 02/11/2010  . TOBACCO ABUSE 12/12/2009     Objective: Physical Exam General: The patient is alert and oriented x3 in no acute distress.  Dermatology: Skin is warm, dry and supple bilateral lower extremities. Negative for open lesions or macerations bilateral.   Vascular: Dorsalis Pedis and Posterior Tibial pulses palpable bilateral.  Capillary fill time is immediate to all digits.  Neurological: Epicritic and protective threshold intact bilateral.   Musculoskeletal: Tenderness to palpation at the medial calcaneal tubercale and through the insertion of the plantar fascia of the bilateral feet. All other joints range of motion within normal limits bilateral. Strength 5/5 in all groups bilateral.  Pain on palpation noted to the posterior tubercle of the bilateral calcaneus at the insertion of the Achilles tendon consistent with retrocalcaneal bursitis. Pain on palpation to the anterior lateral medial aspects of the patient's right ankle. Mild edema noted.   Radiographic exam: Normal osseous mineralization. Joint spaces preserved. No fracture/dislocation/boney destruction. Calcaneal spur present with mild thickening of plantar fascia bilateral. Posterior heel spur noted at the insertion of the Achilles tendon  bilaterally. DJD with bone spur formation noted to the right ankle. No other soft tissue abnormalities or radiopaque foreign bodies.  Assessment: 1. plantar fasciitis bilateral feet  Plan of Care:  1. Patient evaluated. Xrays reviewed.   2. Injection of 0.5cc Celestone soluspan injected into the bilateral heels.  3. Injection of 0.5cc Celestone Soluspan injected into the right ankle. 4. Rx for meloxicam given to patient. 5. Plantar fascial band(s) dispensed for bilateral plantar fasciitis. 6. Instructed patient regarding therapies and modalities at home to alleviate symptoms.  7. Continue wearing orthotics dispensed by Dr. Jacqualyn Posey. 8. Return to clinic in 4 weeks.   Edrick Kins, DPM [facility]  Dr. Edrick Kins, DPM    [address], [place] [address]                Office [phone]  Fax [phone]

## 2017-07-04 ENCOUNTER — Ambulatory Visit (HOSPITAL_COMMUNITY)
Admission: RE | Admit: 2017-07-04 | Discharge: 2017-07-04 | Disposition: A | Discharge status: Home / Self Care | Payer: Medicaid Other | Source: Ambulatory Visit | Attending: Pediatrics | Admitting: Pediatrics

## 2017-07-04 DIAGNOSIS — Z9049 Acquired absence of other specified parts of digestive tract: Secondary | ICD-10-CM | POA: Diagnosis not present

## 2017-07-04 DIAGNOSIS — R932 Abnormal findings on diagnostic imaging of liver and biliary tract: Secondary | ICD-10-CM | POA: Insufficient documentation

## 2017-07-04 DIAGNOSIS — R6881 Early satiety: Secondary | ICD-10-CM | POA: Diagnosis present

## 2017-07-07 ENCOUNTER — Encounter: Payer: Self-pay | Admitting: Pediatrics

## 2017-07-13 ENCOUNTER — Ambulatory Visit (INDEPENDENT_AMBULATORY_CARE_PROVIDER_SITE_OTHER): Payer: Medicaid Other | Admitting: Neurology

## 2017-07-13 DIAGNOSIS — G4733 Obstructive sleep apnea (adult) (pediatric): Secondary | ICD-10-CM | POA: Diagnosis not present

## 2017-07-13 DIAGNOSIS — G4710 Hypersomnia, unspecified: Secondary | ICD-10-CM

## 2017-07-13 DIAGNOSIS — Z6841 Body Mass Index (BMI) 40.0 and over, adult: Secondary | ICD-10-CM

## 2017-07-13 DIAGNOSIS — R51 Headache: Secondary | ICD-10-CM

## 2017-07-13 DIAGNOSIS — R0683 Snoring: Secondary | ICD-10-CM

## 2017-07-13 DIAGNOSIS — R351 Nocturia: Secondary | ICD-10-CM

## 2017-07-13 DIAGNOSIS — Z820 Family history of epilepsy and other diseases of the nervous system: Secondary | ICD-10-CM

## 2017-07-13 DIAGNOSIS — R519 Headache, unspecified: Secondary | ICD-10-CM

## 2017-07-13 DIAGNOSIS — R400 Somnolence: Secondary | ICD-10-CM

## 2017-07-14 ENCOUNTER — Encounter (INDEPENDENT_AMBULATORY_CARE_PROVIDER_SITE_OTHER): Payer: Medicaid Other | Admitting: Neurology

## 2017-07-14 DIAGNOSIS — R0683 Snoring: Secondary | ICD-10-CM

## 2017-07-14 DIAGNOSIS — R400 Somnolence: Secondary | ICD-10-CM

## 2017-07-14 DIAGNOSIS — G47419 Narcolepsy without cataplexy: Secondary | ICD-10-CM | POA: Diagnosis not present

## 2017-07-14 DIAGNOSIS — R51 Headache: Secondary | ICD-10-CM

## 2017-07-14 DIAGNOSIS — Z6841 Body Mass Index (BMI) 40.0 and over, adult: Secondary | ICD-10-CM

## 2017-07-14 DIAGNOSIS — R519 Headache, unspecified: Secondary | ICD-10-CM

## 2017-07-14 DIAGNOSIS — Z820 Family history of epilepsy and other diseases of the nervous system: Secondary | ICD-10-CM

## 2017-07-14 DIAGNOSIS — R351 Nocturia: Secondary | ICD-10-CM

## 2017-07-18 ENCOUNTER — Telehealth: Payer: Self-pay

## 2017-07-18 NOTE — Telephone Encounter (Signed)
-----   Message from Star Age, MD sent at 07/18/2017  8:18 AM EST ----- Patient referred by Dr. Evette Doffing, seen by me on 06/14/17, diagnostic PSG on 07/13/17, MSLT on 07/14/17.   Please call and notify the patient that the recent sleep study and nap study did not show any significant obstructive sleep apnea, with the exception of mild REM related OSA. Mild to moderate snoring was noted. For disturbing snoring, an oral appliance (through a qualified dentist) can be considered.  The MSLT does not show any significant daytime sleepiness and is not in keeping with findings seen with narcolepsy or idiopathic hypersomnolence. Mild, non-specific sleepiness was noted.  Please offer FU appt to go over test results and management strategies.   Star Age, MD, PhD [facility])

## 2017-07-18 NOTE — Progress Notes (Signed)
Patient referred by Dr. Evette Doffing, seen by me on 06/14/17, diagnostic PSG on 07/13/17, MSLT on 07/14/17.   Please call and notify the patient that the recent sleep study and nap study did not show any significant obstructive sleep apnea, with the exception of mild REM related OSA. Mild to moderate snoring was noted. For disturbing snoring, an oral appliance (through a qualified dentist) can be considered.  The MSLT does not show any significant daytime sleepiness and is not in keeping with findings seen with narcolepsy or idiopathic hypersomnolence. Mild, non-specific sleepiness was noted.  Please offer FU appt to go over test results and management strategies.   Star Age, MD, PhD [facility])

## 2017-07-18 NOTE — Procedures (Signed)
PATIENT'S NAME:  Lisa Norton, Lisa Norton DOB:      Jun 30, 1971      MR#:    [id number]     DATE OF RECORDING: 07/13/2017 REFERRING M.D.:  Arbie Cookey L. Evette Doffing, MD Study Performed:   Baseline Polysomnogram HISTORY: 46 year old woman with a history of hypertension, depression, diabetes, plantar fasciitis, right hip pain, smoking and morbid obesity with a BMI of over 40, who reports snoring and excessive daytime somnolence. The patient endorsed the Epworth Sleepiness Scale at 17/24 points. The patient's weight 232 pounds with a height of 61 (inches), resulting in a BMI of 43.7 kg/m2. The patient's neck circumference measured 18.5 inches.  CURRENT MEDICATIONS: Blood glucose meter, Blood glucose monitoring suppl, Farxiga, Neurontin, Naprosyn, Pravachol, Zantac, Januvia, Ultram, Chantix.   PROCEDURE:  This is a multichannel digital polysomnogram utilizing the Somnostar 11.2 system.  Electrodes and sensors were applied and monitored per AASM Specifications.   EEG, EOG, Chin and Limb EMG, were sampled at 200 Hz.  ECG, Snore and Nasal Pressure, Thermal Airflow, Respiratory Effort, CPAP Flow and Pressure, Oximetry was sampled at 50 Hz. Digital video and audio were recorded.      BASELINE STUDY  The patient came for a nocturnal PSG, followed by a next day nap study (MSLT). Lights Out was at 20:51 and Lights On at 06:04.  Total recording time (TRT) was 553.5 minutes, with a total sleep time (TST) of 499.5 minutes. The patient's sleep latency was 36.5 minutes, which is delayed. REM latency was 81 minutes, which is normal. The sleep efficiency was 90.2 %.     SLEEP ARCHITECTURE: WASO (Wake after sleep onset) was 30 minutes with mild sleep fragmentation noted. There were 20.5 minutes in Stage N1, 276 minutes Stage N2, 74.5 minutes Stage N3 and 128.5 minutes in Stage REM. The percentage of Stage N1 was 4.1%, Stage N2 was 55.3%, which is normal, Stage N3 was 14.9%, which is normal, and Stage R (REM sleep) was 25.7%, which is  normal. The arousals were noted as: 64 were spontaneous, 0 were associated with PLMs, 2 were associated with respiratory events.    Audio and video analysis did not show any abnormal or unusual movements, behaviors, phonations or vocalizations. The patient took 2 bathroom breaks. Mild to moderate snoring was noted. The EKG was in keeping with normal sinus rhythm (NSR).  RESPIRATORY ANALYSIS:  There were a total of 19 respiratory events:  4 obstructive apneas, 0 central apneas and 0 mixed apneas with a total of 4 apneas and an apnea index (AI) of .5 /hour. There were 15 hypopneas with a hypopnea index of 1.8 /hour. The patient also had 0 respiratory event related arousals (RERAs).      The total APNEA/HYPOPNEA INDEX (AHI) was 2.3/hour and the total RESPIRATORY DISTURBANCE INDEX was 2.3 /hour.  15 events occurred in REM sleep and 4 events in NREM. The REM AHI was 7. /hour, versus a non-REM AHI of .6. The patient spent 257 minutes of total sleep time in the supine position and 243 minutes in non-supine.. The supine AHI was 3.2 versus a non-supine AHI of 1.2.  OXYGEN SATURATION & C02:  The Wake baseline 02 saturation was 96%, with the lowest being 88%. Time spent below 89% saturation equaled 2 minutes.  PERIODIC LIMB MOVEMENTS: The patient had a total of 0 Periodic Limb Movements.  The Periodic Limb Movement (PLM) index was 0 and the PLM Arousal index was 0/hour.  The study was followed by a next day MSLT. The patient  had 5 nap opportunities and achieved sleep in naps # 1, 2, and 4. Mean sleep latency for 5 naps was 12.6 min, which indicates a mild degree of sleepiness, but not in keeping with a pathologic degree of sleepiness. No REM onset naps were noted. These results argue against a diagnosis of narcolepsy.   IMPRESSION:  1.  Primary Snoring 2.  Hypersomnia, non-specific   RECOMMENDATIONS:  1. This nighttime sleep study does not demonstrate any significant obstructive or central sleep disordered  breathing with the exception of mild REM related OSA. Mild to moderate snoring was noted. For disturbing snoring, an oral appliance (through a qualified dentist) can be considered.  2. This study does not support an intrinsic sleep disorder as a cause of the patient's symptoms. Other causes, including circadian rhythm disturbances, an underlying mood disorder, medication effect and/or an underlying medical problem cannot be ruled out. 3. The MSLT does not show any significant daytime sleepiness and is not in keeping with findings seen with narcolepsy or idiopathic hypersomnolence. Mild, non-specific sleepiness was noted. Clinical correlation is recommended. The patient should be cautioned not to drive, work at heights, or operate dangerous or heavy equipment when tired or sleepy. Review and reiteration of good sleep hygiene measures should be pursued with any patient. 4. The patient will be seen in follow-up by Dr. Rexene Alberts at [facility] for discussion of the test results and further management strategies. The referring provider will be notified of the test results.  I certify that I have reviewed the entire raw data recording prior to the issuance of this report in accordance with the Standards of Accreditation of the American Academy of Sleep Medicine (AASM)   Star Age, MD, PhD Diplomat, American Board of Psychiatry and Neurology (Neurology and Sleep Medicine)

## 2017-07-18 NOTE — Telephone Encounter (Signed)
Pt has returned the call to [employer], she is asking for a call back.  Pt states reception in her area is poor, she stated a message can be left with results

## 2017-07-18 NOTE — Telephone Encounter (Signed)
I called pt to discuss her sleep study results. No answer, left a message asking her to call me back. 

## 2017-07-18 NOTE — Telephone Encounter (Signed)
I called pt. I advised her that her sleep study results showed that she did not have any significant osa with the exception of mild REM related osa. Mild to moderate snoring was noted and I encouraged pt to speak with her dentist regarding an oral appliance if snoring is disturbing to her. I advised pt that her MSLT did not show any significant daytime sleepiness and is not in keeping with findings seen with narcolepsy or idiopathic hypersomnolence. Pt had mild, non-specific sleepiness noted. I offered pt a follow up appt to go over results tomorrow, but can only come to afternoon appts. I advised pt that I will call her if something opens up one afternoon this month. Pt verbalized understanding of results. Pt had no questions at this time but was encouraged to call back if questions arise.

## 2017-07-18 NOTE — Procedures (Signed)
Name:  Lisa Norton, Lisa Norton Reference [id number]  Study Date: 07/14/2017 Procedure #: [id number]  DOB: [birth year]    HISTORY: 46 year old woman with a history of hypertension, depression, diabetes, plantar fasciitis, right hip pain, smoking and morbid obesity with a BMI of over 40, who reports snoring and excessive daytime somnolence. The patient endorsed the Epworth Sleepiness Scale at 17/24 points. The patient's weight 232 pounds with a height of 61 (inches), resulting in a BMI of 43.7 kg/m2. The patient's neck circumference measured 18.5 inches.  Protocol  This is a 13 channel Multiple Sleep Latency Test comprised of 5 channels of EEG (T3-Cz, Cz-T4, F4-M1, C4-M1, O2-M1), 3 channels of Chin EMG, 4 channels of EOG and 1 channel for ECG.   All channels were sampled at 256hz .    This polysomnographic procedure is designed to evaluate (1) the complaint of excessive daytime sleepiness by quantifying the time required to fall asleep and (2) the possibility of narcolepsy by checking for abnormally short latencies to REM sleep.  Electrographic variables include EEG, EMG, EOG and ECG.  Patients are monitored throughout four or five 20-minute opportunities to sleep (naps) at two-hour intervals.  For each nap, the patient is allowed 20 minutes to fall asleep.  Once asleep, the patient is awakened after 15 minutes.  Between naps, the patient is kept as alert as possible.  A sleep latency of 20 minutes indicates that no sleep occurred.  Parametric Analysis  Total Number of Naps 5     NAP # Time of Nap  Sleep Latency (mins) REM Latency (mins) Sleep Time Percent Awake Time Percent  1 07:38 4.5 0 63 38   2 09:44 5 0 66  34   3 11:37 20 0 0  100   4 13:40 14 0 40  60   5 15:35 20 0 0  100    MSLT Summary of Naps  Sleepiness Index: 36.5  Mean Sleep Latency to all Five Naps: 12.7  Mean Sleep Latency to First Four Naps: 10.9  Mean Sleep Latency to First Three Naps: 9.8  Mean Sleep Latency to First Two  Naps: 4.8  Number of Naps with REM Sleep: 0    Results from Preceding PSG Study  Sleep Onset Time 21:12 Sleep Efficiency (%) 90.2  Rise Time 06:04 Sleep Latency (min) 23  Total Sleep Time  499.5 REM Latency (min) 81    I attest to having reviewed every epoch of the entire raw data recording prior to the issuance of this report in accordance with the Standards of the American Academy of Sleep Medicine.    The study was preceded by a nocturnal polysomnogram on 07/13/17.  The patient had 5 nap opportunities and achieved sleep in naps # 1, 2, and 4. Mean sleep latency for 5 naps was 12.6 min, which indicates a mild degree of sleepiness, but not in keeping with a pathologic degree of sleepiness. No REM onset naps were noted. These results argue against a diagnosis of narcolepsy or idiopathic hypersomnolence.   IMPRESSION:  1.  Primary Snoring 2.  Hypersomnia, non-specific   RECOMMENDATIONS:  1. The nighttime sleep study does not demonstrate any significant obstructive or central sleep disordered breathing with the exception of mild REM related OSA. Mild to moderate snoring was noted. For disturbing snoring, an oral appliance (through a qualified dentist) can be considered.  2. These studies does not support an intrinsic sleep disorder as a cause of the patient's symptoms. Other causes, including circadian rhythm disturbances, an  underlying mood disorder, medication effect and/or an underlying medical problem cannot be ruled out. 3. This MSLT does not show any significant daytime sleepiness and is not in keeping with findings seen with narcolepsy or idiopathic hypersomnolence. Mild, non-specific sleepiness was noted. Clinical correlation is recommended. The patient should be cautioned not to drive, work at heights, or operate dangerous or heavy equipment when tired or sleepy. Review and reiteration of good sleep hygiene measures should be pursued with any patient. 4. The patient will be seen in  follow-up by Dr. Rexene Alberts at [facility] for discussion of the test results and further management strategies. The referring provider will be notified of the test results.  I certify that I have reviewed the entire raw data recording prior to the issuance of this report in accordance with the Standards of Accreditation of the American Academy of Sleep Medicine (AASM)   Star Age, MD, PhD Diplomat, American Board of Psychiatry and Neurology (Neurology and Sleep Medicine)

## 2017-07-25 NOTE — Telephone Encounter (Signed)
I called pt. She is agreeable to coming in on 07/26/17 at 2:00pm to discuss her sleep study results. Pt verbalized understanding of appt date and time.

## 2017-07-25 NOTE — Telephone Encounter (Signed)
I called pt to ask if she would be able to come in on 07/26/17 at 1:00pm. No answer, left a message asking her to call me back. If pt calls back, please offer her this appt date and time.

## 2017-07-26 ENCOUNTER — Ambulatory Visit: Payer: Medicaid Other | Admitting: Neurology

## 2017-07-26 ENCOUNTER — Encounter: Payer: Self-pay | Admitting: Neurology

## 2017-07-26 VITALS — BP 126/70 | HR 80 | Ht 61.0 in | Wt 238.0 lb

## 2017-07-26 DIAGNOSIS — G4710 Hypersomnia, unspecified: Secondary | ICD-10-CM

## 2017-07-26 DIAGNOSIS — R0683 Snoring: Secondary | ICD-10-CM

## 2017-07-26 NOTE — Progress Notes (Signed)
Subjective:    Patient ID: Lisa Norton is a 46 y.o. female.  HPI     Interim history:  Lisa Norton is a 46 year old right-handed woman with an underlying medical history of hypertension, depression, diabetes, plantar fasciitis, right hip pain, smoking and morbid obesity with a BMI of over 40, who presents for follow-up consultation of her sleep disturbance, after a recent sleep study testing. The patient is unaccompanied today. I first met her on 06/14/2017 at the request of her primary care physician, at which time she reported snoring and daytime somnolence as well as a family history of narcolepsy. I suggested we proceed with extended sleep study testing in the form of nocturnal polysomnogram followed by a daytime nap study. She had testing on 11/14 and 07/14/2017. I went over her test results with her in detail today. Baseline sleep study from 07/13/2017 showed a sleep latency delayed at 36.5 minutes, REM latency was normal at 81 minutes, sleep efficiency 90.2%, she had 30 minutes of wake after sleep onset with mild sleep fragmentation. She had normal sleep architecture. Multiple moderate snoring was noted. EKG showed normal sinus rhythm. Total AHI was 2.3 per hour, REM AHI mildly elevated at 7 per hour, supine AHI 3.2 per hour. Baseline oxygen saturation was 96%, nadir was 88%. She had no significant PLMS. Her next a MSLT showed a mean sleep latency of 12.6 minutes, no REM onset naps were noted.  Today, 07/26/2017: She reports not sleeping well at night, feeling sleepy during the day. She has not started Chantix yet, planning to quit smoking. Latest A1c has come down. On pravachol 80 mg now. Tries to take a nap when she can. No interim changes to her medical history or medications otherwise. Her previous A1c was above 7 and she was told to add a second medication. She is trying to lose weight.   The patient's allergies, current medications, family history, past medical history, past social  history, past surgical history and problem list were reviewed and updated as appropriate.   Previously (copied from previous notes for reference):   06/14/2017: (She) reports snoring and excessive daytime somnolence. I reviewed your office note from 04/08/2017.  Her Epworth sleepiness score is 17 out of 24 today, fatigue score is 46 out of 63. She complains of difficulty going to sleep and staying asleep at times. She wakes up nonrestorative, just as tired as when she went to bed. She had a tonsillectomy as a child. She lives with her daughter. She is currently not working. She smokes up to 1-1/2 packs per day. She does not currently utilize alcohol on a regular basis. She does not drink caffeine on a regular basis. She reports a family history of narcolepsy in her brother and sister, she reports a mild diagnosis in her siblings and mother also was apparently diagnosed with narcolepsy. In addition, brother and mother have sleep apnea as well. She reports a history of sleepiness for the past 8-10 years, difficulty with sleep maintenance and sleep initiation since she lost her baby in 2016. She has a 38 year old daughter and occasionally stays with her boyfriend, who has mentioned loud snoring to her but no apneas. She denies sleep paralysis or cataplexy type symptoms. She does feel she has vivid dreams at times and feeling like sequences at night when she is awake. She quit drinking soda to help her diabetes. She does not maintain a very set scheduled for her bedtime, does not watch TV in bed. Bedtime is between 9  and 11, she reports nocturia about once or twice per average night and occasional morning headaches, up to twice per week, typically does not take medication for this. She has gabapentin and tramadol for her foot pain but does not take these on a regular basis. In the past after she lost her baby she was tried on Ambien which did not help and trazodone made her drowsy during the day. She has not tried  over-the-counter sleep aids. Wakeup time is around 7.   Her Past Medical History Is Significant For: Past Medical History:  Diagnosis Date  . Anxiety   . COLD SORE 05/15/2010  . DEPRESSION 12/12/2009  . Diabetes mellitus without complication ([place])   . High cholesterol   . 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 HYPERTENSION 12/12/2009  . PREDIABETES 02/11/2010  . TOBACCO ABUSE 12/12/2009    Her Past Surgical History Is Significant For: Past Surgical History:  Procedure Laterality Date  . CERVICAL FUSION    . CESAREAN SECTION WITH BILATERAL TUBAL LIGATION Bilateral 02/12/2015   Procedure: CESAREAN SECTION;  Surgeon: Florian Buff, MD;  Location: [facility];  Service: Obstetrics;  Laterality: Bilateral;  Fetal Demise  . CHOLECYSTECTOMY  2006  . TONSILLECTOMY  1988    Her Family History Is Significant For: Family History  Problem Relation Age of Onset  . Heart disease Mother   . Narcolepsy Mother     Her Social History Is Significant For: Social History   Socioeconomic History  . Marital status: Legally Separated    Spouse name: None  . Number of children: None  . Years of education: None  . Highest education level: None  Social Needs  . Financial resource strain: None  . Food insecurity - worry: None  . Food insecurity - inability: None  . Transportation needs - medical: None  . Transportation needs - non-medical: None  Occupational History  . None  Tobacco Use  . Smoking status: Current Every Day Smoker    Packs/day: 1.50    Years: 20.00    Pack years: 30.00    Types: Cigarettes  . Smokeless tobacco: Former Systems developer    Types: Snuff    Quit date: 05/05/1986  Substance and Sexual Activity  . Alcohol use: No  . Drug use: No  . Sexual activity: Not Currently  Other Topics Concern  . None  Social History Narrative  . None    Her Allergies Are:  Allergies  Allergen Reactions  . Metformin And Related     diarrhea  :   Her Current Medications Are:  Outpatient Encounter  Medications as of 07/26/2017  Medication Sig  . blood glucose meter kit and supplies KIT Dispense based on patient and insurance preference. Use up to four times daily as directed. (FOR ICD-9 250.00, 250.01).  . Blood Glucose Monitoring Suppl (TRUE METRIX AIR GLUCOSE METER) w/Device KIT 1 kit by Does not apply route 3 (three) times daily.  . dapagliflozin propanediol (FARXIGA) 10 MG TABS tablet Take 10 mg by mouth daily.  Marland Kitchen gabapentin (NEURONTIN) 100 MG capsule TAKE 1 CAPSULE BY MOUTH 3 TIMES DAILY  . JANUVIA 100 MG tablet TAKE ONE TABLET BY MOUTH EVERY DAY  . meloxicam (MOBIC) 15 MG tablet Take 1 tablet (15 mg total) by mouth daily.  . naproxen (NAPROSYN) 500 MG tablet Take 1 tablet (500 mg total) by mouth 2 (two) times daily with a meal.  . pravastatin (PRAVACHOL) 80 MG tablet Take 0.5 tablets (40 mg total) by mouth daily.  Marland Kitchen  ranitidine (ZANTAC) 150 MG tablet TAKE ONE TABLET BY MOUTH EVERY DAY  . varenicline (CHANTIX STARTING MONTH PAK) 0.5 MG X 11 & 1 MG X 42 tablet Take one 0.5 mg tablet by mouth once daily for 3 days, then increase to one 0.5 mg tablet twice daily for 4 days, then increase to one 1 mg tablet twice daily. (Patient not taking: Reported on 06/29/2017)   Facility-Administered Encounter Medications as of 07/26/2017  Medication  . betamethasone acetate-betamethasone sodium phosphate (CELESTONE) injection 3 mg  :  Review of Systems:  Out of a complete 14 point review of systems, all are reviewed and negative with the exception of these symptoms as listed below:  Review of Systems  Neurological:       Pt presents today to discuss her sleep study results.    Objective:  Neurological Exam  Physical Exam Physical Examination:   Vitals:   07/26/17 1401  BP: 126/70  Pulse: 80    General Examination: The patient is a very pleasant 46 y.o. female in no acute distress. She appears well-developed and well-nourished and well groomed.   HEENT: Normocephalic, atraumatic,  pupils are equal, round and reactive to light and accommodation. Extraocular tracking is good without limitation to gaze excursion or nystagmus noted but has b/l laze eye (since childhood, per pt). Normal smooth pursuit is noted. Hearing is grossly intact. Face is symmetric with normal facial animation and normal facial sensation. Speech is clear with no dysarthria noted. There is no hypophonia. There is no lip, neck/head, jaw or voice tremor. Neck is supple with full range of passive and active motion. There are no carotid bruits on auscultation. Oropharynx exam reveals: mild mouth dryness, adequate dental hygiene and mild airway crowding. Mallampati is class I. Tongue protrudes centrally and palate elevates symmetrically.   Chest: Clear to auscultation without wheezing, rhonchi or crackles noted.  Heart: S1+S2+0, regular and normal without murmurs, rubs or gallops noted.   Abdomen: Soft, non-tender and non-distended with normal bowel sounds appreciated on auscultation.  Extremities: There is no pitting edema in the distal lower extremities bilaterally. Pedal pulses are intact.  Skin: Warm and dry without trophic changes noted. There are no varicose veins.  Musculoskeletal: exam reveals no obvious joint deformities, tenderness or joint swelling or erythema.   Neurologically:  Mental status: The patient is awake, alert and oriented in all 4 spheres. His immediate and remote memory, attention, language skills and fund of knowledge are appropriate. There is no evidence of aphasia, agnosia, apraxia or anomia. Speech is clear with normal prosody and enunciation. Thought process is linear. Mood is normal and affect is normal.  Cranial nerves II - XII are as described above under HEENT exam. In addition: shoulder shrug is normal with equal shoulder height noted. Motor exam: Normal bulk, strength and tone is noted. There is no drift, tremor or rebound. Romberg is negative. Reflexes are 1+  throughout. Fine motor skills and coordination: grossly intact.  Cerebellar testing: No dysmetria or intention tremor. There is no truncal or gait ataxia.  Sensory exam: intact to light touch, vibration, temperature sense in the upper and lower extremities.  Gait, station and balance: She stands easily. No veering to one side is noted. No leaning to one side is noted. Posture is age-appropriate and stance is narrow based. Gait shows normal stride length and normal pace. Tandem walk is unremarkable.   Assessment and Plan:  In summary, DEJUANA WEIST is a very pleasant 46 year old female with an  underlying medical history of hypertension, depression, diabetes, plantar fasciitis, right hip pain, smoking and morbid obesity with a BMI of over 40, who presents for follow up consultation of her sleep disturbance after sleep study testing. She had a baseline sleep study, followed by a next a nap study. Her baseline sleep study from 07/13/2017 showed benign results with good sleep architecture, no significant sleep disordered breathing with the exception of minimal REM related sleep apnea, mild to moderate snoring, no significant PLMS and no significant sleep disruption. Next a MS LT showed 3 naps and mean sleep latency of 12.6 minutes. This is not in keeping with pathological hypersomnolence such as is seen with narcolepsy patients or idiopathic hypersomnolence. We talked about her test results in detail today. She is advised to try to maintain good sleep hygiene and try to nap when she has time to avoid falling asleep but she cannot afford fall asleep. She is advised to talk to her primary care physician about depressive symptoms as she does endorse some depression. She is advised to utilize caffeine with caution during the day. For her snoring, she would benefit from weight loss. She is furthermore advised to try to quit smoking. Physical exam is stable. I suggested as needed follow-up from my end of things. I  answered all her questions today and the patient was in agreement.  I spent 25 minutes in total face-to-face time with the patient, more than 50% of which was spent in counseling and coordination of care, reviewing test results, reviewing medication and discussing or reviewing the diagnosis of nonspecific mild sleepiness, its prognosis and treatment options. Pertinent laboratory and imaging test results that were available during this visit with the patient were reviewed by me and considered in my medical decision making (see chart for details).

## 2017-07-26 NOTE — Patient Instructions (Signed)
Your sleep study results were overall benign, with mild sleepiness noted during the day, not in keeping with narcolepsy or idiopathic hypersomnolence.  Please try to make enough time for sleep. You may be able to take a nap during the day to avoid falling asleep when you don't mean to.   Please remember to try to maintain good sleep hygiene, which means: Keep a regular sleep and wake schedule, try not to exercise or have a meal within 2 hours of your bedtime, try to keep your bedroom conducive for sleep, that is, cool and dark, without light distractors such as an illuminated alarm clock, and refrain from watching TV right before sleep or in the middle of the night and do not keep the TV or radio on during the night. Also, try not to use or play on electronic devices at bedtime, such as your cell phone, tablet PC or laptop. If you like to read at bedtime on an electronic device, try to dim the background light as much as possible. Do not eat in the middle of the night.

## 2017-08-03 ENCOUNTER — Encounter: Payer: Self-pay | Admitting: Pediatrics

## 2017-08-03 ENCOUNTER — Ambulatory Visit: Payer: Medicaid Other | Admitting: Pediatrics

## 2017-08-03 ENCOUNTER — Ambulatory Visit (INDEPENDENT_AMBULATORY_CARE_PROVIDER_SITE_OTHER): Payer: Medicaid Other

## 2017-08-03 ENCOUNTER — Encounter: Payer: Self-pay | Admitting: Podiatry

## 2017-08-03 ENCOUNTER — Ambulatory Visit (INDEPENDENT_AMBULATORY_CARE_PROVIDER_SITE_OTHER): Payer: Medicaid Other | Admitting: Podiatry

## 2017-08-03 ENCOUNTER — Encounter: Payer: Self-pay | Admitting: Physician Assistant

## 2017-08-03 VITALS — BP 133/81 | HR 95 | Temp 97.8°F | Ht 61.0 in | Wt 237.2 lb

## 2017-08-03 DIAGNOSIS — N8110 Cystocele, unspecified: Secondary | ICD-10-CM | POA: Diagnosis not present

## 2017-08-03 DIAGNOSIS — M25571 Pain in right ankle and joints of right foot: Secondary | ICD-10-CM

## 2017-08-03 DIAGNOSIS — E785 Hyperlipidemia, unspecified: Secondary | ICD-10-CM | POA: Diagnosis not present

## 2017-08-03 DIAGNOSIS — M722 Plantar fascial fibromatosis: Secondary | ICD-10-CM

## 2017-08-03 DIAGNOSIS — K219 Gastro-esophageal reflux disease without esophagitis: Secondary | ICD-10-CM | POA: Diagnosis not present

## 2017-08-03 DIAGNOSIS — R6881 Early satiety: Secondary | ICD-10-CM

## 2017-08-03 DIAGNOSIS — M659 Synovitis and tenosynovitis, unspecified: Secondary | ICD-10-CM

## 2017-08-03 MED ORDER — PANTOPRAZOLE SODIUM 40 MG PO TBEC: 40.0000 mg | DELAYED_RELEASE_TABLET | Freq: Every day | ORAL | 3 refills | Status: DC

## 2017-08-03 MED ORDER — PRAVASTATIN SODIUM 80 MG PO TABS: 80.0000 mg | ORAL_TABLET | Freq: Every day | ORAL | 5 refills | Status: DC

## 2017-08-03 NOTE — Progress Notes (Signed)
  Subjective:   Patient ID: Lisa Norton, female    DOB: 05-Mar-1971, 46 y.o.   MRN: [id number] CC: vaginal changes HPI: Lisa Norton is a 46 y.o. female presenting for follow up  For the past 2 weeks has noticed more prominent tissue in vaginal area that she can push back in when sitting on the toilet Seems to "fall out" more when on the toilet No burning with urination, no fevers, no pain in the area No dyspareunia Periods continue to come every 4-6 weeks, due next in about 6 days  Continues to feel overly full after eating small to normal size meals Sometimes has stomach pain after eating but that is rare Sometimes feels reflux symptoms including nausea after eating Worse with moving or bending over after eating  Has regular bowel movements, going every day No constipation  Smoking daily Has not yet started Chantix  Hyperlipidemia: Taking pravastatin 80 mg a day, increased from 40-day last visit No side effects  Diabetes: Taking medicines regularly, no lows  Relevant past medical, surgical, family and social history reviewed. Allergies and medications reviewed and updated. Social History   Tobacco Use  Smoking Status Current Every Day Smoker  . Packs/day: 1.50  . Years: 20.00  . Pack years: 30.00  . Types: Cigarettes  Smokeless Tobacco Former Systems developer  . Types: Snuff  . Quit date: 05/05/1986   ROS: Per HPI   Objective:    BP 133/81   Pulse 95   Temp 97.8 F (36.6 C) (Oral)   Ht 5\' 1"  (1.549 m)   Wt 237 lb 3.2 oz (107.6 kg)   BMI 44.82 kg/m   Wt Readings from Last 3 Encounters:  08/03/17 237 lb 3.2 oz (107.6 kg)  07/26/17 238 lb (108 kg)  06/29/17 235 lb (106.6 kg)    Gen: NAD, alert, obese, cooperative with exam, NCAT EYES: no conjunctival injection, or no icterus ENT: OP without erythema LYMPH: no cervical LAD CV: NRRR, normal S1/S2, no murmur, distal pulses 2+ b/l Resp: CTABL, no wheezes, normal WOB Abd: +BS, soft, mildly tender with deep palpation  throughout, ND.  Ext: No edema, warm Neuro: Alert and oriented, strength equal b/l UE and LE, coordination grossly normal MSK: normal muscle bulk GU: Normal external female genitalia Some redundant tissue present posterior vaginal wall No redness, no irritation No masses palpated   Assessment & Plan:  Diagnoses and all orders for this visit:  Early satiety Ongoing symptoms, has been on ranitidine, increase to protonix, will refer for further workup -     Ambulatory referral to Gastroenterology  Hyperlipidemia, unspecified hyperlipidemia type Needs repeat lipid panel, fasting, pt to come back -     Lipid panel -     Basic Metabolic Panel -     pravastatin (PRAVACHOL) 80 MG tablet; Take 1 tablet (80 mg total) by mouth daily.  Vaginal prolapse Referral to gynecology   Follow up plan: Return in about 3 months (around 11/01/2017). Assunta Found, MD [place]

## 2017-08-03 NOTE — Patient Instructions (Signed)
Pre-Operative Instructions  Congratulations, you have decided to take an important step towards improving your quality of life.  You can be assured that the doctors and staff at [facility] will be with you every step of the way.  Here are some important things you should know:  1. Plan to be at the surgery center/hospital at least 1 (one) hour prior to your scheduled time, unless otherwise directed by the surgical center/hospital staff.  You must have a responsible adult accompany you, remain during the surgery and drive you home.  Make sure you have directions to the surgical center/hospital to ensure you arrive on time. 2. If you are having surgery at [facility] or [facility] hospitals, you will need a copy of your medical history and physical form from your family physician within one month prior to the date of surgery. We will give you a form for your primary physician to complete.  3. We make every effort to accommodate the date you request for surgery.  However, there are times where surgery dates or times have to be moved.  We will contact you as soon as possible if a change in schedule is required.   4. No aspirin/ibuprofen for one week before surgery.  If you are on aspirin, any non-steroidal anti-inflammatory medications (Mobic, Aleve, Ibuprofen) should not be taken seven (7) days prior to your surgery.  You make take Tylenol for pain prior to surgery.  5. Medications - If you are taking daily heart and blood pressure medications, seizure, reflux, allergy, asthma, anxiety, pain or diabetes medications, make sure you notify the surgery center/hospital before the day of surgery so they can tell you which medications you should take or avoid the day of surgery. 6. No food or drink after midnight the night before surgery unless directed otherwise by surgical center/hospital staff. 7. No alcoholic beverages 24-hours prior to surgery.  No smoking 24-hours prior or 24-hours after  surgery. 8. Wear loose pants or shorts. They should be loose enough to fit over bandages, boots, and casts. 9. Don't wear slip-on shoes. Sneakers are preferred. 10. Bring your boot with you to the surgery center/hospital.  Also bring crutches or a walker if your physician has prescribed it for you.  If you do not have this equipment, it will be provided for you after surgery. 11. If you have not been contacted by the surgery center/hospital by the day before your surgery, call to confirm the date and time of your surgery. 12. Leave-time from work may vary depending on the type of surgery you have.  Appropriate arrangements should be made prior to surgery with your employer. 13. Prescriptions will be provided immediately following surgery by your doctor.  Fill these as soon as possible after surgery and take the medication as directed. Pain medications will not be refilled on weekends and must be approved by the doctor. 14. Remove nail polish on the operative foot and avoid getting pedicures prior to surgery. 15. Wash the night before surgery.  The night before surgery wash the foot and leg well with water and the antibacterial soap provided. Be sure to pay special attention to beneath the toenails and in between the toes.  Wash for at least three (3) minutes. Rinse thoroughly with water and dry well with a towel.  Perform this wash unless told not to do so by your physician.  Enclosed: 1 Ice pack (please put in freezer the night before surgery)   1 Hibiclens skin cleaner     Pre-op instructions  If you have any questions regarding the instructions, please do not hesitate to call our office.  [place]: [address], [place], [place] [address] -- [phone]  [place]: [address] [place], [place] [address] -- [phone]  [place]: [address]  [place], [place] [address] -- [phone]  [place]: [address], [address], [place],  [address] -- [phone]  Website:  [email or web address] 

## 2017-08-03 NOTE — Patient Instructions (Addendum)
Come back next week for blood work, fasting

## 2017-08-07 NOTE — Progress Notes (Signed)
   Subjective: Patient presents today for follow-up evaluation of bilateral plantar fasciitis.  She reports continued pain in the right foot and ankle.  She states the pain has improved in the left foot.  Wearing the fascial bands and receiving the injection have helped alleviate the pain.  She states taking meloxicam provides no significant relief.  Patient presents today for further treatment and evaluation.   Past Medical History:  Diagnosis Date  . Anxiety   . COLD SORE 05/15/2010  . DEPRESSION 12/12/2009  . Diabetes mellitus without complication ([place])   . High cholesterol   . 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 HYPERTENSION 12/12/2009  . PREDIABETES 02/11/2010  . TOBACCO ABUSE 12/12/2009     Objective: Physical Exam General: The patient is alert and oriented x3 in no acute distress.  Dermatology: Skin is warm, dry and supple bilateral lower extremities. Negative for open lesions or macerations bilateral.   Vascular: Dorsalis Pedis and Posterior Tibial pulses palpable bilateral.  Capillary fill time is immediate to all digits.  Neurological: Epicritic and protective threshold intact bilateral.   Musculoskeletal: Tenderness to palpation at the medial calcaneal tubercale and through the insertion of the plantar fascia of the bilateral feet. All other joints range of motion within normal limits bilateral. Strength 5/5 in all groups bilateral.  Pain on palpation noted to the posterior tubercle of the bilateral calcaneus at the insertion of the Achilles tendon consistent with retrocalcaneal bursitis. Pain on palpation to the anterior lateral medial aspects of the patient's right ankle. Mild edema noted.   Radiographic exam: Anterior ankle exostosis with spurring and joint space narrowing noted.  Best visualized on lateral view.  Assessment: 1. plantar fasciitis bilateral feet 2.  Right ankle synovitis/DJD  Plan of Care:  1. Patient evaluated. Xrays reviewed.   2. Injection of 0.5cc  Celestone soluspan injected into the bilateral heels.  3. Injection of 0.5cc Celestone Soluspan injected into the right ankle. 4.  Continue taking meloxicam and wearing plantar fascia braces. 5.  Patient has been dealing with chronic plantar fasciitis and ankle pain for years with no alleviation with conservative treatment. 6.  Last A1c was 6.3 mg/dL. 7.  Return to clinic 1 week postop.   Edrick Kins, DPM [facility]  Dr. Edrick Kins, DPM    [address], [place] [address]                Office [phone]  Fax [phone]

## 2017-08-10 ENCOUNTER — Encounter: Payer: Self-pay | Admitting: Pediatrics

## 2017-08-13 ENCOUNTER — Encounter: Payer: Self-pay | Admitting: Pediatrics

## 2017-08-15 ENCOUNTER — Encounter: Payer: Self-pay | Admitting: Pediatrics

## 2017-08-15 NOTE — Telephone Encounter (Signed)
Can you figure out where we are with her gyn and GI referrals and let pt know?

## 2017-08-16 ENCOUNTER — Encounter: Payer: Self-pay | Admitting: Obstetrics & Gynecology

## 2017-08-18 ENCOUNTER — Encounter: Payer: Self-pay | Admitting: Physician Assistant

## 2017-08-18 ENCOUNTER — Ambulatory Visit: Payer: Medicaid Other | Admitting: Physician Assistant

## 2017-08-18 ENCOUNTER — Other Ambulatory Visit (INDEPENDENT_AMBULATORY_CARE_PROVIDER_SITE_OTHER): Payer: Medicaid Other

## 2017-08-18 VITALS — BP 112/74 | HR 72 | Ht 61.5 in | Wt 230.4 lb

## 2017-08-18 DIAGNOSIS — R140 Abdominal distension (gaseous): Secondary | ICD-10-CM

## 2017-08-18 DIAGNOSIS — R6881 Early satiety: Secondary | ICD-10-CM

## 2017-08-18 DIAGNOSIS — K219 Gastro-esophageal reflux disease without esophagitis: Secondary | ICD-10-CM | POA: Diagnosis not present

## 2017-08-18 DIAGNOSIS — E118 Type 2 diabetes mellitus with unspecified complications: Secondary | ICD-10-CM | POA: Diagnosis not present

## 2017-08-18 DIAGNOSIS — Z7689 Persons encountering health services in other specified circumstances: Secondary | ICD-10-CM | POA: Diagnosis not present

## 2017-08-18 LAB — H. PYLORI ANTIBODY, IGG: H PYLORI IGG: NEGATIVE

## 2017-08-18 MED ORDER — PANTOPRAZOLE SODIUM 40 MG PO TBEC: DELAYED_RELEASE_TABLET | ORAL | 11 refills | Status: DC

## 2017-08-18 NOTE — Progress Notes (Signed)
Subjective:    Patient ID: Lisa Norton, female    DOB: 1971/05/01, 46 y.o.   MRN: [id number]  HPI Lisa Norton is a pleasant 46 year old white female, new to GI today referred by Dr. Diona Browner with complaints of belching burping gas bloating and early satiety.  She says her symptoms have been present over the past couple of years but have worsened somewhat and her significant other encouraged her to get evaluated. Patient has history of hypertension, adult onset diabetes mellitus, C7 radiculopathy, depression, she is status post cholecystectomy in 2009 and has history of morbid obesity. Ultrasound was done on July 04, 2017 showed the common bile duct 5 mm and hepatic steatosis. She was just started on Protonix a couple of days ago and says that that is helping somewhat and she said some decrease in belching.  She says she has ongoing symptoms with belching and burping off and on all day long.  She is not particularly sure that is worse after eating.  She also relates having a sensation of feeling "full" all the time.  She says sometimes she feels like she is just eaten a complete meal when she has not eaten for several hours.  She says other days she may be very hungry and cannot seem to get full.  She has many more days of early satiety type symptoms.  He has nausea at times but no vomiting.  Weight has been stable. She says she knows her diabetes has not been under good control, she is motivated to lose weight but so far the changes she has made have not made any difference. She has been requiring Mobic on a regular basis for her foot pain and is anticipating a surgery on her foot in the near future. Bowel habits have been normal no melena or hematochezia.  Review of Systems Pertinent positive and negative review of systems were noted in the above HPI section.  All other review of systems was otherwise negative.  Outpatient Encounter Medications as of 08/18/2017  Medication Sig  . blood glucose  meter kit and supplies KIT Dispense based on patient and insurance preference. Use up to four times daily as directed. (FOR ICD-9 250.00, 250.01).  . Blood Glucose Monitoring Suppl (TRUE METRIX AIR GLUCOSE METER) w/Device KIT 1 kit by Does not apply route 3 (three) times daily.  . dapagliflozin propanediol (FARXIGA) 10 MG TABS tablet Take 10 mg by mouth daily.  Marland Kitchen gabapentin (NEURONTIN) 100 MG capsule TAKE 1 CAPSULE BY MOUTH 3 TIMES DAILY  . JANUVIA 100 MG tablet TAKE ONE TABLET BY MOUTH EVERY DAY  . meloxicam (MOBIC) 15 MG tablet Take 15 mg by mouth daily.  . naproxen (NAPROSYN) 500 MG tablet Take 1 tablet (500 mg total) by mouth 2 (two) times daily with a meal.  . pantoprazole (PROTONIX) 40 MG tablet Take 1 tab by mouth every morning.  . pravastatin (PRAVACHOL) 80 MG tablet Take 1 tablet (80 mg total) by mouth daily.  . varenicline (CHANTIX STARTING MONTH PAK) 0.5 MG X 11 & 1 MG X 42 tablet Take one 0.5 mg tablet by mouth once daily for 3 days, then increase to one 0.5 mg tablet twice daily for 4 days, then increase to one 1 mg tablet twice daily.  . [DISCONTINUED] pantoprazole (PROTONIX) 40 MG tablet Take 1 tablet (40 mg total) by mouth daily.   Facility-Administered Encounter Medications as of 08/18/2017  Medication  . betamethasone acetate-betamethasone sodium phosphate (CELESTONE) injection 3 mg   Allergies  Allergen Reactions  . Metformin And Related     diarrhea   Patient Active Problem List   Diagnosis Date Noted  . S/P cesarean section 02/12/2015  . Fetal demise, greater than 22 weeks, antepartum   . 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 Obesity 08/13/2014  . History of hypertension 08/12/2014  . C7 radiculopathy 02/11/2014  . Dyslipidemia 01/04/2011  . Type 2 diabetes mellitus ([place]) 01/04/2011  . TOBACCO ABUSE 12/12/2009  . DEPRESSION 12/12/2009  . Essential hypertension 12/12/2009   Social History   Socioeconomic History  . Marital status: Legally Separated    Spouse  name: Not on file  . Number of children: 2  . Years of education: Not on file  . Highest education level: Not on file  Social Needs  . Financial resource strain: Not on file  . Food insecurity - worry: Not on file  . Food insecurity - inability: Not on file  . Transportation needs - medical: Not on file  . Transportation needs - non-medical: Not on file  Occupational History  . Not on file  Tobacco Use  . Smoking status: Current Every Day Smoker    Packs/day: 1.50    Years: 20.00    Pack years: 30.00    Types: Cigarettes  . Smokeless tobacco: Former Systems developer    Types: Snuff    Quit date: 05/05/1986  Substance and Sexual Activity  . Alcohol use: No  . Drug use: No  . Sexual activity: Not Currently  Other Topics Concern  . Not on file  Social History Narrative  . Not on file    Ms. Lea's family history includes Heart disease in her mother; Narcolepsy in her mother.      Objective:    Vitals:   08/18/17 1030  BP: 112/74  Pulse: 72    Physical Exam well-developed white female in no acute distress, pleasant blood pressure 112/74 pulse 72, height 5 foot 1, weight 230, BMI of 42.8.  HEENT; nontraumatic normocephalic EOMI PERRLA sclera anicteric, Cardiovascular; regular rate and rhythm with S1-S2 no murmur rub or gallop, Pulmonary; clear bilaterally, Abdomen; morbidly obese, soft nontender there is no palpable mass or hepatosplenomegaly bowel sounds are active, Rectal ;exam not done, Extremities; no clubbing cyanosis or edema skin warm dry, Neuro psych ;mood and affect appropriate       Assessment & Plan:   #61 46 year old white female with chronic symptoms of belching burping gas bloating and early satiety over the past couple of years. She has already noted some improvement after just starting Protonix 40 mg p.o. every morning.  I think some of her symptoms are secondary to chronic GERD, will rule out underlying gastroparesis, rule out H. pylori induced gastropathy  #2 adult  onset diabetes mellitus poorly controlled 3.  Morbid obesity 4.  Status post cholecystectomy 5.  Hypertension 6.  Hepatic steatosis on recent ultrasound  Plan; Continue Protonix 40 mg p.o. every morning AC breakfast.  We have sent refills. We discussed an antireflux regimen and she was given a copy of an antireflux diet, have also given her a low gas diet to help with complaints of bloating and gas Scheduled for gastric emptying scan. Check H. pylori antibody Patient will benefit greatly from weight loss, she is interested in specific dietary instruction, we will refer to her to [facility] health weight management clinic. Patient will be established with Dr. Havery Moros.     Correy Weidner Genia Harold PA-C 08/18/2017   Cc: Eustaquio Maize, MD

## 2017-08-18 NOTE — Progress Notes (Signed)
Agree with assessment and plan as outlined. Will await results of gastric emptying study, pending her course may also need EGD.

## 2017-08-18 NOTE — Patient Instructions (Addendum)
Please go to the basement level to have your labs drawn.  We sent refills for the Protonix 40 mg to your pharmacy.  Low gas diet and antireflux information is provided.   We are referring you to a Weight management clinic. [place], # [id number],  Phone [phone]. They will be calling you with an appointment.  You have been scheduled for a gastric emptying scan at [facility] on Friday 09-02-2017 at 7:30 am. Please arrive at 7:15 am to your appointment for registration. Please make certain not to have anything to eat or drink after midnight the night before your test. Hold all stomach medications (ex: Zofran, phenergan, Reglan, Protonix ) 6 hours prior to your test. If you need to reschedule your appointment, please contact radiology scheduling at [phone]. _____________________________________________________________________ A gastric-emptying study measures how long it takes for food to move through your stomach. There are several ways to measure stomach emptying. In the most common test, you eat food that contains a small amount of radioactive material. A scanner that detects the movement of the radioactive material is placed over your abdomen to monitor the rate at which food leaves your stomach. This test normally takes about 4 hours to complete. _____________________________________________________________________

## 2017-09-02 ENCOUNTER — Ambulatory Visit (HOSPITAL_COMMUNITY): Payer: Medicaid Other

## 2017-09-12 ENCOUNTER — Encounter (HOSPITAL_COMMUNITY): Payer: Self-pay | Admitting: Student

## 2017-09-12 ENCOUNTER — Inpatient Hospital Stay (HOSPITAL_COMMUNITY)
Admission: AD | Admit: 2017-09-12 | Discharge: 2017-09-12 | Disposition: A | Discharge status: Home / Self Care | Payer: Medicaid Other | Source: Ambulatory Visit | Attending: Obstetrics and Gynecology | Admitting: Obstetrics and Gynecology

## 2017-09-12 ENCOUNTER — Telehealth: Payer: Self-pay | Admitting: *Deleted

## 2017-09-12 DIAGNOSIS — N8189 Other female genital prolapse: Secondary | ICD-10-CM | POA: Diagnosis not present

## 2017-09-12 DIAGNOSIS — Z3202 Encounter for pregnancy test, result negative: Secondary | ICD-10-CM | POA: Insufficient documentation

## 2017-09-12 DIAGNOSIS — F17210 Nicotine dependence, cigarettes, uncomplicated: Secondary | ICD-10-CM | POA: Diagnosis not present

## 2017-09-12 DIAGNOSIS — N361 Urethral diverticulum: Secondary | ICD-10-CM

## 2017-09-12 DIAGNOSIS — R102 Pelvic and perineal pain: Secondary | ICD-10-CM | POA: Insufficient documentation

## 2017-09-12 LAB — URINALYSIS, ROUTINE W REFLEX MICROSCOPIC
Bacteria, UA: NONE SEEN
Bilirubin Urine: NEGATIVE
Glucose, UA: >=500 mg/dL — AB
Ketones, ur: NEGATIVE mg/dL
Leukocytes, UA: NEGATIVE
Nitrite: NEGATIVE
PROTEIN: NEGATIVE mg/dL
SPECIFIC GRAVITY, URINE: 1.027 (ref 1.005–1.030)
pH: 5 (ref 5.0–8.0)

## 2017-09-12 LAB — POCT PREGNANCY, URINE: PREG TEST UR: NEGATIVE

## 2017-09-12 NOTE — Telephone Encounter (Signed)
"  They told me to call you to set up my surgery."  Have you signed consent forms.  "I haven't done anything."  You will need to see Dr. Amalia Hailey for a consultation first.  "Then why did he tell me to schedule for January 31?"  I don't know why he would have told you that.  He always has his patients come in for a consultation then he gets them set up for surgery.  Would you like me to transfer you to a scheduler?  "Yes, that will be fine."  I'll tentatively put you down for January 31.  I transferred her to [facility] so she could schedule an appointment.

## 2017-09-12 NOTE — Discharge Instructions (Signed)
Pelvic Organ Prolapse Pelvic organ prolapse is the stretching, bulging, or dropping of pelvic organs into an abnormal position. It happens when the muscles and tissues that surround and support pelvic structures are stretched or weak. Pelvic organ prolapse can involve:  Vagina (vaginal prolapse).  Uterus (uterine prolapse).  Bladder (cystocele).  Rectum (rectocele).  Intestines (enterocele).  When organs other than the vagina are involved, they often bulge into the vagina or protrude from the vagina, depending on how severe the prolapse is. What are the causes? Causes of this condition include:  Pregnancy, labor, and childbirth.  Long-lasting (chronic) cough.  Chronic constipation.  Obesity.  Past pelvic surgery.  Aging. During and after menopause, a decreased production of the hormone estrogen can weaken pelvic ligaments and muscles.  Consistently lifting more than 50 lb (23 kg).  Buildup of fluid in the abdomen due to certain diseases and other conditions.  What are the signs or symptoms? Symptoms of this condition include:  Loss of bladder control when you cough, sneeze, strain, and exercise (stress incontinence). This may be worse immediately following childbirth, and it may gradually improve over time.  Feeling pressure in your pelvis or vagina. This pressure may increase when you cough or when you are having a bowel movement.  A bulge that protrudes from the opening of your vagina or against your vaginal wall. If your uterus protrudes through the opening of your vagina and rubs against your clothing, you may also experience soreness, ulcers, infection, pain, and bleeding.  Increased effort to have a bowel movement or urinate.  Pain in your low back.  Pain, discomfort, or disinterest in sexual intercourse.  Repeated bladder infections (urinary tract infections).  Difficulty inserting or inability to insert a tampon or applicator.  In some people,  this condition does not cause any symptoms. How is this diagnosed? Your health care provider may perform an internal and external vaginal and rectal exam. During the exam, you may be asked to cough and strain while you are lying down, sitting, and standing up. Your health care provider will determine if other tests are required, such as bladder function tests. How is this treated? In most cases, this condition needs to be treated only if it produces symptoms. No treatment is guaranteed to correct the prolapse or relieve the symptoms completely. Treatment may include:  Lifestyle changes, such as: ? Avoiding drinking beverages that contain caffeine. ? Increasing your intake of high-fiber foods. This can help to decrease constipation and straining during bowel movements. ? Emptying your bladder at scheduled times (bladder training therapy). This can help to reduce or avoid urinary incontinence. ? Losing weight if you are overweight or obese.  Estrogen. Estrogen may help mild prolapse by increasing the strength and tone of pelvic floor muscles.  Kegel exercises. These may help mild cases of prolapse by strengthening and tightening the muscles of the pelvic floor.  Pessary insertion. A pessary is a soft, flexible device that is placed into your vagina by your health care provider to help support the vaginal walls and keep pelvic organs in place.  Surgery. This is often the only form of treatment for severe prolapse. Different types of surgeries are available.  Follow these instructions at home:  Wear a sanitary pad or absorbent product if you have urinary incontinence.  Avoid heavy lifting and straining with exercise and work. Do not hold your breath when you perform mild to moderate lifting and exercise activities. Limit your activities as directed  by your health care provider.  Take medicines only as directed by your health care provider.  Perform Kegel exercises as directed by your health  care provider.  If you have a pessary, take care of it as directed by your health care provider. Contact a health care provider if:  Your symptoms interfere with your daily activities or sex life.  You need medicine to help with the discomfort.  You notice bleeding from the vagina that is not related to your period.  You have a fever.  You have pain or bleeding when you urinate.  You have bleeding when you have a bowel movement.  You lose urine when you have sex.  You have chronic constipation.  You have a pessary that falls out.  You have vaginal discharge that has a bad smell.  You have low abdominal pain or cramping that is unusual for you. This information is not intended to replace advice given to you by your health care provider. Make sure you discuss any questions you have with your health care provider. Document Released: 03/13/2014 Document Revised: 01/22/2016 Document Reviewed: 10/29/2013 Elsevier Interactive Patient Education  2018 [employer].   Kegel Exercises Kegel exercises help strengthen the muscles that support the rectum, vagina, small intestine, bladder, and uterus. Doing Kegel exercises can help:  Improve bladder and bowel control.  Improve sexual response.  Reduce problems and discomfort during pregnancy.  Kegel exercises involve squeezing your pelvic floor muscles, which are the same muscles you squeeze when you try to stop the flow of urine. The exercises can be done while sitting, standing, or lying down, but it is best to vary your position. Phase 1 exercises 1. Squeeze your pelvic floor muscles tight. You should feel a tight lift in your rectal area. If you are a female, you should also feel a tightness in your vaginal area. Keep your stomach, buttocks, and legs relaxed. 2. Hold the muscles tight for up to 10 seconds. 3. Relax your muscles. Repeat this exercise 50 times a day or as many times as told by your health care provider. Continue to  do this exercise for at least 4-6 weeks or for as long as told by your health care provider. This information is not intended to replace advice given to you by your health care provider. Make sure you discuss any questions you have with your health care provider. Document Released: 08/02/2012 Document Revised: 04/10/2016 Document Reviewed: 07/06/2015 Elsevier Interactive Patient Education  [employer].       In late 2019, the [facility] will be moving to the [place]. At that time, the MAU (Maternity Admissions Unit), where you are being seen today, will no longer take care of non-pregnant patients. We strongly encourage you to find a doctor's office before that time, so that you can be seen with any GYN concerns, like vaginal discharge, urinary tract infection, etc.. in a timely manner.  In order to make an office visit more convenient, the Center for [place] at [facility] will be offering evening hours with same-day appointments, walk-in appointments and scheduled appointments available during this time.  Center for [facility] Hours: Monday - 8am - 7:30 pm with walk-in between 4pm- 7:30 pm Tuesday - 8 am - 5 pm (starting 11/29/17 we will be open late and accepting walk-ins from 4pm - 7:30pm) Wednesday - 8 am - 5 pm (starting 03/01/18 we will be open late and accepting walk-ins from 4pm - 7:30pm)  Thursday 8 am - 5 pm (starting 06/01/18 we will be open late and accepting walk-ins from 4pm - 7:30pm) Friday 8 am - 5 pm  For an appointment please call the Center for [place] @ [facility] at [phone]  For urgent needs, [facility] is also available for management of urgent GYN complaints such as vaginal discharge or urinary tract infections.

## 2017-09-12 NOTE — MAU Note (Signed)
Pt states she has something hanging out of her vagina for the last month.  Has appointment in clinic on Thursday, but started hurting today.  Has spotting with wiping.

## 2017-09-12 NOTE — MAU Provider Note (Signed)
History     CSN: [id number]  Arrival date and time: 09/12/17 1015   First Provider Initiated Contact with Patient 09/12/17 1059      Chief Complaint  Patient presents with  . Vaginal Pain   HPI  Lisa Norton is a 47 y.o. [id number] non pregnant female who presents with vaginal pain. States for the last month she has felt a "ball" protruding from her vagina. Her PCP referred her to gynecology & she has an appointment with [facility] on Thursday of this week. She presents today b/c last night after BM she felt like the mass was protruding more than normal & she was having pain. Denies pain at this time, states pain only lasted a few minutes. States she only feels the mass when she's squatting or sitting on the toilet. She is sexually active with 1 partner x3 years & reports that symptoms are not worsening with intercourse. Denies dysuria or hematuria but states she feels like she has trouble emptying her bladder. States she has to sit on the toilet for several minutes & urine slowly comes out as a dribble; ultimately feels like she does empty her bladder just that it takes longer than normal for her.    Past Medical History:  Diagnosis Date  . Anxiety   . COLD SORE 05/15/2010  . DEPRESSION 12/12/2009  . Diabetes mellitus without complication ([place])   . High cholesterol   . 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 HYPERTENSION 12/12/2009  . TOBACCO ABUSE 12/12/2009    Past Surgical History:  Procedure Laterality Date  . CERVICAL FUSION    . CESAREAN SECTION WITH BILATERAL TUBAL LIGATION Bilateral 02/12/2015   Procedure: CESAREAN SECTION;  Surgeon: Florian Buff, MD;  Location: [facility];  Service: Obstetrics;  Laterality: Bilateral;  Fetal Demise  . CHOLECYSTECTOMY  2006  . TONSILLECTOMY  1988    Family History  Problem Relation Age of Onset  . Heart disease Mother   . Narcolepsy Mother     Social History   Tobacco Use  . Smoking status: Current Every Day Smoker    Packs/day: 1.50    Years: 20.00     Pack years: 30.00    Types: Cigarettes  . Smokeless tobacco: Former Systems developer    Types: Snuff    Quit date: 05/05/1986  Substance Use Topics  . Alcohol use: No  . Drug use: No    Allergies:  Allergies  Allergen Reactions  . Metformin And Related     diarrhea    Facility-Administered Medications Prior to Admission  Medication Dose Route Frequency Provider Last Rate Last Dose  . betamethasone acetate-betamethasone sodium phosphate (CELESTONE) injection 3 mg  3 mg Intramuscular Once Edrick Kins, DPM       Medications Prior to Admission  Medication Sig Dispense Refill Last Dose  . dapagliflozin propanediol (FARXIGA) 10 MG TABS tablet Take 10 mg by mouth daily. 90 tablet 1 09/12/2017 at Unknown time  . gabapentin (NEURONTIN) 100 MG capsule TAKE 1 CAPSULE BY MOUTH 3 TIMES DAILY 90 capsule 2 Past Month at Unknown time  . JANUVIA 100 MG tablet TAKE ONE TABLET BY MOUTH EVERY DAY 30 tablet 2 09/12/2017 at Unknown time  . meloxicam (MOBIC) 15 MG tablet Take 15 mg by mouth daily.   09/12/2017 at Unknown time  . pantoprazole (PROTONIX) 40 MG tablet Take 1 tab by mouth every morning. 30 tablet 11 09/12/2017 at Unknown time  . pravastatin (PRAVACHOL) 80 MG tablet Take 1 tablet (80 mg  total) by mouth daily. 30 tablet 5 09/12/2017 at Unknown time  . blood glucose meter kit and supplies KIT Dispense based on patient and insurance preference. Use up to four times daily as directed. (FOR ICD-9 250.00, 250.01). 1 each 0 Taking  . Blood Glucose Monitoring Suppl (TRUE METRIX AIR GLUCOSE METER) w/Device KIT 1 kit by Does not apply route 3 (three) times daily. 1 kit 0 Taking  . naproxen (NAPROSYN) 500 MG tablet Take 1 tablet (500 mg total) by mouth 2 (two) times daily with a meal. (Patient not taking: Reported on 09/12/2017) 60 tablet 1 Not Taking at Unknown time  . varenicline (CHANTIX STARTING MONTH PAK) 0.5 MG X 11 & 1 MG X 42 tablet Take one 0.5 mg tablet by mouth once daily for 3 days, then increase to one 0.5  mg tablet twice daily for 4 days, then increase to one 1 mg tablet twice daily. 53 tablet 0 not started    Review of Systems  Constitutional: Negative.   Gastrointestinal: Negative.   Genitourinary: Positive for difficulty urinating and vaginal pain (none currently). Negative for decreased urine volume, dyspareunia, dysuria, frequency, hematuria, menstrual problem and vaginal discharge.   Physical Exam   Blood pressure (!) 149/88, pulse 78, temperature 98.2 F (36.8 C), temperature source Oral, resp. rate 18, height 5' 1.5" (1.562 m), weight 233 lb (105.7 kg), last menstrual period 08/31/2017.  Physical Exam  Nursing note and vitals reviewed. Constitutional: She is oriented to person, place, and time. She appears well-developed and well-nourished. No distress.  HENT:  Head: Normocephalic and atraumatic.  Eyes: Conjunctivae are normal. Right eye exhibits no discharge. Left eye exhibits no discharge. No scleral icterus.  Neck: Normal range of motion.  Respiratory: Effort normal. No respiratory distress.  Genitourinary: No bleeding in the vagina. No foreign body in the vagina. No vaginal discharge found.  Genitourinary Comments: Tissue from posterior vagina visible when labia moved apart, does not protrude past introitus.  Smooth round mass ~3 cm palpated anteriorly.   Neurological: She is alert and oriented to person, place, and time.  Skin: Skin is warm and dry. She is not diaphoretic.  Psychiatric: She has a normal mood and affect. Her behavior is normal. Judgment and thought content normal.    MAU Course  Procedures Results for orders placed or performed during the hospital encounter of 09/12/17 (from the past 48 hour(s))  Urinalysis, Routine w reflex microscopic     Status: Abnormal   Collection Time: 09/12/17 10:30 AM  Result Value Ref Range   Color, Urine STRAW (A) YELLOW   APPearance CLEAR CLEAR   Specific Gravity, Urine 1.027 1.005 - 1.030   pH 5.0 5.0 - 8.0   Glucose, UA  >=500 (A) NEGATIVE mg/dL   Hgb urine dipstick SMALL (A) NEGATIVE   Bilirubin Urine NEGATIVE NEGATIVE   Ketones, ur NEGATIVE NEGATIVE mg/dL   Protein, ur NEGATIVE NEGATIVE mg/dL   Nitrite NEGATIVE NEGATIVE   Leukocytes, UA NEGATIVE NEGATIVE   RBC / HPF 0-5 0 - 5 RBC/hpf   WBC, UA 0-5 0 - 5 WBC/hpf   Bacteria, UA NONE SEEN NONE SEEN   Squamous Epithelial / LPF 0-5 (A) NONE SEEN  Culture, OB Urine     Status: None   Collection Time: 09/12/17 10:30 AM  Result Value Ref Range   Specimen Description OB CLEAN CATCH    Special Requests NONE    Culture      NO GROWTH NO GROUP B STREP (S.AGALACTIAE) ISOLATED Performed  at [facility], [place] [address], [place], [place] [address]    Report Status 09/13/2017 FINAL   Pregnancy, urine POC     Status: None   Collection Time: 09/12/17 10:42 AM  Result Value Ref Range   Preg Test, Ur NEGATIVE NEGATIVE    Comment:        THE SENSITIVITY OF THIS METHODOLOGY IS >24 mIU/mL      MDM UPT negative Visual inspection & bimanual exam repeated by Dr. Rip Harbour. Likely rectal prolapse & possible urethral diverticulum. Pt stable for discharge to f/u with gyn as scheduled.   Assessment and Plan  A:  1. Pelvic floor relaxation   2. Urethral diverticulum    P: Discharge home Keep f/u with gyn as scheduled Discussed reasons to return to MAU Urine culture pending  Jorje Guild 09/12/2017, 10:59 AM

## 2017-09-13 LAB — CULTURE, OB URINE: Culture: NO GROWTH

## 2017-09-15 ENCOUNTER — Ambulatory Visit: Payer: Medicaid Other | Admitting: Obstetrics & Gynecology

## 2017-09-15 ENCOUNTER — Telehealth: Payer: Self-pay | Admitting: *Deleted

## 2017-09-15 ENCOUNTER — Encounter: Payer: Self-pay | Admitting: Obstetrics & Gynecology

## 2017-09-15 VITALS — BP 142/82 | HR 94 | Ht 61.5 in | Wt 233.2 lb

## 2017-09-15 DIAGNOSIS — N810 Urethrocele: Secondary | ICD-10-CM

## 2017-09-15 DIAGNOSIS — N816 Rectocele: Secondary | ICD-10-CM

## 2017-09-15 DIAGNOSIS — N814 Uterovaginal prolapse, unspecified: Secondary | ICD-10-CM | POA: Diagnosis not present

## 2017-09-15 DIAGNOSIS — N939 Abnormal uterine and vaginal bleeding, unspecified: Secondary | ICD-10-CM | POA: Diagnosis not present

## 2017-09-15 NOTE — Telephone Encounter (Signed)
Pt referred to Dr Blossom Hoops. Appt scheduled for 11/09/17 @ 1230 in the [place] office. Attempted to call patient, but only got voice mail. Left message stating I am calling with information on her appointment, please return my call.

## 2017-09-15 NOTE — Patient Instructions (Signed)
Pelvic Organ Prolapse Pelvic organ prolapse is the stretching, bulging, or dropping of pelvic organs into an abnormal position. It happens when the muscles and tissues that surround and support pelvic structures are stretched or weak. Pelvic organ prolapse can involve:  Vagina (vaginal prolapse).  Uterus (uterine prolapse).  Bladder (cystocele).  Rectum (rectocele).  Intestines (enterocele).  When organs other than the vagina are involved, they often bulge into the vagina or protrude from the vagina, depending on how severe the prolapse is. What are the causes? Causes of this condition include:  Pregnancy, labor, and childbirth.  Long-lasting (chronic) cough.  Chronic constipation.  Obesity.  Past pelvic surgery.  Aging. During and after menopause, a decreased production of the hormone estrogen can weaken pelvic ligaments and muscles.  Consistently lifting more than 50 lb (23 kg).  Buildup of fluid in the abdomen due to certain diseases and other conditions.  What are the signs or symptoms? Symptoms of this condition include:  Loss of bladder control when you cough, sneeze, strain, and exercise (stress incontinence). This may be worse immediately following childbirth, and it may gradually improve over time.  Feeling pressure in your pelvis or vagina. This pressure may increase when you cough or when you are having a bowel movement.  A bulge that protrudes from the opening of your vagina or against your vaginal wall. If your uterus protrudes through the opening of your vagina and rubs against your clothing, you may also experience soreness, ulcers, infection, pain, and bleeding.  Increased effort to have a bowel movement or urinate.  Pain in your low back.  Pain, discomfort, or disinterest in sexual intercourse.  Repeated bladder infections (urinary tract infections).  Difficulty inserting or inability to insert a tampon or applicator.  In some people, this  condition does not cause any symptoms. How is this diagnosed? Your health care provider may perform an internal and external vaginal and rectal exam. During the exam, you may be asked to cough and strain while you are lying down, sitting, and standing up. Your health care provider will determine if other tests are required, such as bladder function tests. How is this treated? In most cases, this condition needs to be treated only if it produces symptoms. No treatment is guaranteed to correct the prolapse or relieve the symptoms completely. Treatment may include:  Lifestyle changes, such as: ? Avoiding drinking beverages that contain caffeine. ? Increasing your intake of high-fiber foods. This can help to decrease constipation and straining during bowel movements. ? Emptying your bladder at scheduled times (bladder training therapy). This can help to reduce or avoid urinary incontinence. ? Losing weight if you are overweight or obese.  Estrogen. Estrogen may help mild prolapse by increasing the strength and tone of pelvic floor muscles.  Kegel exercises. These may help mild cases of prolapse by strengthening and tightening the muscles of the pelvic floor.  Pessary insertion. A pessary is a soft, flexible device that is placed into your vagina by your health care provider to help support the vaginal walls and keep pelvic organs in place.  Surgery. This is often the only form of treatment for severe prolapse. Different types of surgeries are available.  Follow these instructions at home:  Wear a sanitary pad or absorbent product if you have urinary incontinence.  Avoid heavy lifting and straining with exercise and work. Do not hold your breath when you perform mild to moderate lifting and exercise activities. Limit your activities as directed by your health care   provider.  Take medicines only as directed by your health care provider.  Perform Kegel exercises as directed by your health care  provider.  If you have a pessary, take care of it as directed by your health care provider. Contact a health care provider if:  Your symptoms interfere with your daily activities or sex life.  You need medicine to help with the discomfort.  You notice bleeding from the vagina that is not related to your period.  You have a fever.  You have pain or bleeding when you urinate.  You have bleeding when you have a bowel movement.  You lose urine when you have sex.  You have chronic constipation.  You have a pessary that falls out.  You have vaginal discharge that has a bad smell.  You have low abdominal pain or cramping that is unusual for you. This information is not intended to replace advice given to you by your health care provider. Make sure you discuss any questions you have with your health care provider. Document Released: 03/13/2014 Document Revised: 01/22/2016 Document Reviewed: 10/29/2013 Elsevier Interactive Patient Education  2018 [employer]  

## 2017-09-15 NOTE — Progress Notes (Signed)
History:  47 y.o. [id number] here today for eval for pelvic organ prolapse. Pt was seen in the MAU with c/o of feeling a bulge. She reports that a few weeks ago she felt a pop and since that time she has had a bulge in her vagina. She says that it is very uncomfortable. She reports constipation and having to push hard when having a stool but, she does not have to push her stool out manually. She has intermittent incontinence.  She is a tob user and reports a frequent but, not a chronic cough.  Today is her birthday.   Pt also mentioned intermenstrual bleeding that has not been eval.   The following portions of the patient's history were reviewed and updated as appropriate: allergies, current medications, past family history, past medical history, past social history, past surgical history and problem list.  Review of Systems:  Pertinent items are noted in HPI.   Objective:  Physical Exam Blood pressure (!) 142/82, pulse 94, height 5' 1.5" (1.562 m), weight 233 lb 3.2 oz (105.8 kg), last menstrual period 09/07/2017.  CONSTITUTIONAL: Well-developed, well-nourished female in no acute distress.  HENT:  Normocephalic, atraumatic EYES: Conjunctivae and EOM are normal. No scleral icterus.  NECK: Normal range of motion SKIN: Skin is warm and dry. No rash noted. Not diaphoretic.No pallor. [place]: Alert and oriented to person, place, and time. Normal coordination.  Abd: Soft, nontender and nondistended; obese  Pelvic: Normal appearing external genitalia; there is a visible rectocele and cystocele. There is a suburethral cyst. Pt also has uterine prolapse, stage II, with valsalva. The q tip test shows >90 degrees of mobility.   Normal discharge.  Small uterus, no other palpable masses, no uterine or adnexal tenderness. A pap was obtained.    Assessment & Plan:  Pelvic organ prolapse.  Rectocele  Uterine prolapse  Urethrocele   Referral to Dr. Mikle Bosworth for management of POP    AUB  s/p Pelvic US to eval  May need endo bx  F/u in 2-4 week after [place]  Total face-to-face time with patient was 20 min.  Greater than 50% was spent in counseling and coordination of care with the patient.   Lisa Norton L. Harraway-Smith, M.D., Cherlynn June

## 2017-09-16 ENCOUNTER — Encounter: Payer: Self-pay | Admitting: Obstetrics & Gynecology

## 2017-09-16 NOTE — Telephone Encounter (Signed)
Called patient and gave her appt details. She voiced understanding.

## 2017-09-16 NOTE — Telephone Encounter (Signed)
Patient returned call

## 2017-09-20 ENCOUNTER — Ambulatory Visit (HOSPITAL_COMMUNITY)
Admission: RE | Admit: 2017-09-20 | Discharge: 2017-09-20 | Disposition: A | Discharge status: Home / Self Care | Payer: Medicaid Other | Source: Ambulatory Visit | Attending: Obstetrics & Gynecology | Admitting: Obstetrics & Gynecology

## 2017-09-20 DIAGNOSIS — N939 Abnormal uterine and vaginal bleeding, unspecified: Secondary | ICD-10-CM

## 2017-09-21 ENCOUNTER — Encounter: Payer: Self-pay | Admitting: Podiatry

## 2017-09-21 ENCOUNTER — Ambulatory Visit: Payer: Medicaid Other | Admitting: Podiatry

## 2017-09-21 DIAGNOSIS — M722 Plantar fascial fibromatosis: Secondary | ICD-10-CM | POA: Diagnosis not present

## 2017-09-21 DIAGNOSIS — M659 Synovitis and tenosynovitis, unspecified: Secondary | ICD-10-CM

## 2017-09-21 NOTE — Patient Instructions (Signed)
Pre-Operative Instructions  Congratulations, you have decided to take an important step towards improving your quality of life.  You can be assured that the doctors and staff at [facility] will be with you every step of the way.  Here are some important things you should know:  1. Plan to be at the surgery center/hospital at least 1 (one) hour prior to your scheduled time, unless otherwise directed by the surgical center/hospital staff.  You must have a responsible adult accompany you, remain during the surgery and drive you home.  Make sure you have directions to the surgical center/hospital to ensure you arrive on time. 2. If you are having surgery at [facility] or [facility], you will need a copy of your medical history and physical form from your family physician within one month prior to the date of surgery. We will give you a form for your primary physician to complete.  3. We make every effort to accommodate the date you request for surgery.  However, there are times where surgery dates or times have to be moved.  We will contact you as soon as possible if a change in schedule is required.   4. No aspirin/ibuprofen for one week before surgery.  If you are on aspirin, any non-steroidal anti-inflammatory medications (Mobic, Aleve, Ibuprofen) should not be taken seven (7) days prior to your surgery.  You make take Tylenol for pain prior to surgery.  5. Medications - If you are taking daily heart and blood pressure medications, seizure, reflux, allergy, asthma, anxiety, pain or diabetes medications, make sure you notify the surgery center/hospital before the day of surgery so they can tell you which medications you should take or avoid the day of surgery. 6. No food or drink after midnight the night before surgery unless directed otherwise by surgical center/hospital staff. 7. No alcoholic beverages 24-hours prior to surgery.  No smoking 24-hours prior or 24-hours after  surgery. 8. Wear loose pants or shorts. They should be loose enough to fit over bandages, boots, and casts. 9. Don't wear slip-on shoes. Sneakers are preferred. 10. Bring your boot with you to the surgery center/hospital.  Also bring crutches or a walker if your physician has prescribed it for you.  If you do not have this equipment, it will be provided for you after surgery. 11. If you have not been contacted by the surgery center/hospital by the day before your surgery, call to confirm the date and time of your surgery. 12. Leave-time from work may vary depending on the type of surgery you have.  Appropriate arrangements should be made prior to surgery with your employer. 13. Prescriptions will be provided immediately following surgery by your doctor.  Fill these as soon as possible after surgery and take the medication as directed. Pain medications will not be refilled on weekends and must be approved by the doctor. 14. Remove nail polish on the operative foot and avoid getting pedicures prior to surgery. 15. Wash the night before surgery.  The night before surgery wash the foot and leg well with water and the antibacterial soap provided. Be sure to pay special attention to beneath the toenails and in between the toes.  Wash for at least three (3) minutes. Rinse thoroughly with water and dry well with a towel.  Perform this wash unless told not to do so by your physician.  Enclosed: 1 Ice pack (please put in freezer the night before surgery)   1 Hibiclens skin cleaner     Pre-op instructions  If you have any questions regarding the instructions, please do not hesitate to call our office.  [place]: [address], [place], [place] [address] -- [phone]  [place]: [address] [place], [place] [address] -- [phone]  [place]: [address]  [place], [place] [address] -- [phone]  [place]: [address], [address], [place], [place] [address] -- [phone]  Website:  [email or web address] 

## 2017-09-22 ENCOUNTER — Encounter: Payer: Self-pay | Admitting: Obstetrics & Gynecology

## 2017-09-28 NOTE — Progress Notes (Signed)
   Subjective: Patient presents today for follow-up evaluation of bilateral plantar fasciitis.  She reports continued pain in the right foot and ankle.  Patient states that the injection she received last visit only helped for short period of time.  She continues to complain of bilateral plantar fasciitis which is chronic and has been ongoing for a long period of time as well as right ankle synovitis.  She also has a history of recurrent ankle sprains to the right lower extremity.  Patient currently has surgery scheduled for September 29, 2017.  Past Medical History:  Diagnosis Date  . Anxiety   . COLD SORE 05/15/2010  . DEPRESSION 12/12/2009  . Diabetes mellitus without complication ([place])   . High cholesterol   . 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 HYPERTENSION 12/12/2009  . TOBACCO ABUSE 12/12/2009     Objective: Physical Exam General: The patient is alert and oriented x3 in no acute distress.  Dermatology: Skin is warm, dry and supple bilateral lower extremities. Negative for open lesions or macerations bilateral.   Vascular: Dorsalis Pedis and Posterior Tibial pulses palpable bilateral.  Capillary fill time is immediate to all digits.  Neurological: Epicritic and protective threshold intact bilateral.   Musculoskeletal: Tenderness to palpation at the medial calcaneal tubercale and through the insertion of the plantar fascia of the bilateral feet. All other joints range of motion within normal limits bilateral. Strength 5/5 in all groups bilateral.  Pain on palpation noted to the posterior tubercle of the bilateral calcaneus at the insertion of the Achilles tendon consistent with retrocalcaneal bursitis. Pain on palpation to the anterior lateral medial aspects of the patient's right ankle. Mild edema noted.  There is a positive anterior drawer sign consistent with a compromised ATFL to the lateral aspect of the patient's right ankle.  This is consistent with patient's given history of recurrent  ankle sprains to the right lower extremity for several years now.  Assessment: 1. plantar fasciitis bilateral feet 2.  Right ankle synovitis/DJD  Plan of Care:  1. Patient evaluated.    2. Today we discussed the conservative versus surgical management of the presenting pathology. The patient opts for surgical management. All possible complications and details of the procedure were explained. All patient questions were answered. No guarantees were expressed or implied. 3. Authorization for surgery was initiated today. Surgery will consist of endoscopic plantar fasciotomy bilateral.  Ankle arthroscopy with debridement right.  Possible open ankle exostectomy.  Arthroscopic thermal capsular repair of ATFL right ankle. 4.  Return to clinic 1 week postop   Edrick Kins, DPM [facility]  Dr. Edrick Kins, DPM    [address], [place] [address]                Office [phone]  Fax [phone]

## 2017-09-29 ENCOUNTER — Encounter: Payer: Self-pay | Admitting: Podiatry

## 2017-09-29 ENCOUNTER — Ambulatory Visit: Payer: Medicaid Other | Admitting: Pediatrics

## 2017-09-29 DIAGNOSIS — M86671 Other chronic osteomyelitis, right ankle and foot: Secondary | ICD-10-CM | POA: Diagnosis not present

## 2017-09-29 DIAGNOSIS — M722 Plantar fascial fibromatosis: Secondary | ICD-10-CM | POA: Diagnosis not present

## 2017-09-29 DIAGNOSIS — M7671 Peroneal tendinitis, right leg: Secondary | ICD-10-CM | POA: Diagnosis not present

## 2017-10-01 ENCOUNTER — Encounter: Payer: Self-pay | Admitting: Podiatry

## 2017-10-05 ENCOUNTER — Ambulatory Visit (INDEPENDENT_AMBULATORY_CARE_PROVIDER_SITE_OTHER): Payer: Medicaid Other | Admitting: Podiatry

## 2017-10-05 ENCOUNTER — Encounter: Payer: Self-pay | Admitting: Podiatry

## 2017-10-05 ENCOUNTER — Ambulatory Visit (INDEPENDENT_AMBULATORY_CARE_PROVIDER_SITE_OTHER): Payer: Medicaid Other

## 2017-10-05 VITALS — BP 97/58 | HR 65

## 2017-10-05 DIAGNOSIS — M659 Synovitis and tenosynovitis, unspecified: Secondary | ICD-10-CM | POA: Diagnosis not present

## 2017-10-05 DIAGNOSIS — Z9889 Other specified postprocedural states: Secondary | ICD-10-CM

## 2017-10-09 NOTE — Progress Notes (Signed)
   Subjective:  Patient presents today status post right ankle arthroscopy and EPF. DOS: 09/29/17. She states the incisions look well and the pain is tolerable. She reports some nausea while in the office during the X-Ray but it has since resolved. Patient is here for further evaluation and treatment.    Past Medical History:  Diagnosis Date  . Anxiety   . COLD SORE 05/15/2010  . DEPRESSION 12/12/2009  . Diabetes mellitus without complication ([place])   . High cholesterol   . 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 HYPERTENSION 12/12/2009  . TOBACCO ABUSE 12/12/2009      Objective/Physical Exam Neurovascular status intact.  Skin incisions appear to be well coapted with sutures and staples intact. No sign of infectious process noted. No dehiscence. No active bleeding noted. Moderate edema noted to the surgical extremity.  Radiographic Exam:  Normal osseous mineralization. Joint spaces preserved. No fracture/dislocation/boney destruction.    Assessment: 1. s/p right ankle arthroscopy and EPF. DOS: 09/29/17.   Plan of Care:  1. Patient was evaluated. X-rays reviewed 2. Doing well. Dressing changed.  3. Post op shoe dispensed. Weightbearing in post op shoe.  4. Return to clinic in one week.    Edrick Kins, DPM [facility]  Dr. Edrick Kins, [place] [address]                                        [place], [place] [address]                Office [phone]  Fax [phone]

## 2017-10-12 ENCOUNTER — Ambulatory Visit (INDEPENDENT_AMBULATORY_CARE_PROVIDER_SITE_OTHER): Payer: Medicaid Other | Admitting: Podiatry

## 2017-10-12 ENCOUNTER — Encounter: Payer: Self-pay | Admitting: Podiatry

## 2017-10-12 DIAGNOSIS — M722 Plantar fascial fibromatosis: Secondary | ICD-10-CM

## 2017-10-12 DIAGNOSIS — Z9889 Other specified postprocedural states: Secondary | ICD-10-CM

## 2017-10-12 DIAGNOSIS — M659 Synovitis and tenosynovitis, unspecified: Secondary | ICD-10-CM

## 2017-10-16 NOTE — Progress Notes (Signed)
   Subjective:  Patient presents today status post right ankle arthroscopy and EPF. DOS: 09/29/17. She reports some soreness in the ankle. She reports an associated popping sensation with ROM. There are no modifying factors noted. Patient is here for further evaluation and treatment.    Past Medical History:  Diagnosis Date  . Anxiety   . COLD SORE 05/15/2010  . DEPRESSION 12/12/2009  . Diabetes mellitus without complication ([place])   . High cholesterol   . 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 HYPERTENSION 12/12/2009  . TOBACCO ABUSE 12/12/2009      Objective/Physical Exam Neurovascular status intact.  Skin incisions appear to be well coapted with sutures and staples intact. No sign of infectious process noted. No dehiscence. No active bleeding noted. Moderate edema noted to the surgical extremity.  Assessment: 1. s/p right ankle arthroscopy and EPF. DOS: 09/29/17.   Plan of Care:  1. Patient was evaluated.  2. Staples and sutures removed today.  3. Continue weightbearing in CAM boot and post op shoe.  4. Return to clinic in 2 weeks.    Edrick Kins, DPM [facility]  Dr. Edrick Kins, [place]                                        [place], [place] [address]                Office [phone]  Fax [phone]

## 2017-10-24 ENCOUNTER — Other Ambulatory Visit: Payer: Self-pay | Admitting: Podiatry

## 2017-10-24 ENCOUNTER — Other Ambulatory Visit: Payer: Self-pay | Admitting: Pediatrics

## 2017-10-24 DIAGNOSIS — E1165 Type 2 diabetes mellitus with hyperglycemia: Secondary | ICD-10-CM

## 2017-10-24 DIAGNOSIS — K219 Gastro-esophageal reflux disease without esophagitis: Secondary | ICD-10-CM

## 2017-10-24 DIAGNOSIS — G629 Polyneuropathy, unspecified: Secondary | ICD-10-CM

## 2017-10-26 ENCOUNTER — Encounter: Payer: Self-pay | Admitting: Podiatry

## 2017-10-26 ENCOUNTER — Ambulatory Visit (INDEPENDENT_AMBULATORY_CARE_PROVIDER_SITE_OTHER): Payer: Medicaid Other | Admitting: Podiatry

## 2017-10-26 DIAGNOSIS — Z9889 Other specified postprocedural states: Secondary | ICD-10-CM

## 2017-10-26 DIAGNOSIS — M722 Plantar fascial fibromatosis: Secondary | ICD-10-CM

## 2017-10-26 DIAGNOSIS — M659 Synovitis and tenosynovitis, unspecified: Secondary | ICD-10-CM

## 2017-10-28 ENCOUNTER — Telehealth: Payer: Self-pay | Admitting: *Deleted

## 2017-10-28 DIAGNOSIS — Z9889 Other specified postprocedural states: Secondary | ICD-10-CM

## 2017-10-28 NOTE — Telephone Encounter (Signed)
Dr. Amalia Hailey ordered PT, ROM, Gait and Balance and Strengthening for S/P right ankle arthroscopy and EPF DOS 09/29/2017. Hand delivered to [facility] - In-office.

## 2017-10-30 NOTE — Progress Notes (Signed)
   Subjective:  Patient presents today status post right ankle arthroscopy and EPF. DOS: 09/29/17. She reports some continued tenderness at the incision site. She reports associated, sporadic "popping" of the joint. She has been wearing the post op shoe as directed but states it causes pain to the right heel. She also has a new complaint of RLE swelling that began two weeks ago. Patient is here for further evaluation and treatment.    Past Medical History:  Diagnosis Date  . Anxiety   . COLD SORE 05/15/2010  . DEPRESSION 12/12/2009  . Diabetes mellitus without complication ([place])   . High cholesterol   . 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 HYPERTENSION 12/12/2009  . TOBACCO ABUSE 12/12/2009      Objective/Physical Exam Neurovascular status intact.  Skin incisions appear to be well coapted. No sign of infectious process noted. No dehiscence. No active bleeding noted. Moderate edema noted to the surgical extremity.  Assessment: 1. s/p right ankle arthroscopy and EPF. DOS: 09/29/17.   Plan of Care:  1. Patient was evaluated.  2. Orders for physical therapy placed for three times weekly for four weeks. 3. Discontinue wearing post op shoe.  4. Return to clinic as needed to address left foot surgery. Needs to have pelvic surgery before left foot surgery.    Edrick Kins, DPM [facility]  Dr. Edrick Kins, [place]                                        [place], [place] [address]                Office [phone]  Fax [phone]

## 2017-10-31 NOTE — Telephone Encounter (Signed)
[facility] - In-office states they do not accept Medicaid. Orders to [facility] que.

## 2017-11-02 ENCOUNTER — Encounter: Payer: Self-pay | Admitting: Pediatrics

## 2017-11-02 ENCOUNTER — Ambulatory Visit: Payer: Medicaid Other | Admitting: Pediatrics

## 2017-11-02 VITALS — BP 130/84 | HR 79 | Temp 97.3°F | Ht 61.5 in | Wt 243.0 lb

## 2017-11-02 DIAGNOSIS — E1169 Type 2 diabetes mellitus with other specified complication: Secondary | ICD-10-CM | POA: Diagnosis not present

## 2017-11-02 DIAGNOSIS — E119 Type 2 diabetes mellitus without complications: Secondary | ICD-10-CM

## 2017-11-02 DIAGNOSIS — E785 Hyperlipidemia, unspecified: Secondary | ICD-10-CM

## 2017-11-02 DIAGNOSIS — E1165 Type 2 diabetes mellitus with hyperglycemia: Secondary | ICD-10-CM | POA: Diagnosis not present

## 2017-11-02 DIAGNOSIS — N898 Other specified noninflammatory disorders of vagina: Secondary | ICD-10-CM | POA: Diagnosis not present

## 2017-11-02 DIAGNOSIS — B9689 Other specified bacterial agents as the cause of diseases classified elsewhere: Secondary | ICD-10-CM | POA: Diagnosis not present

## 2017-11-02 DIAGNOSIS — M722 Plantar fascial fibromatosis: Secondary | ICD-10-CM

## 2017-11-02 DIAGNOSIS — Z6841 Body Mass Index (BMI) 40.0 and over, adult: Secondary | ICD-10-CM | POA: Diagnosis not present

## 2017-11-02 DIAGNOSIS — N760 Acute vaginitis: Secondary | ICD-10-CM

## 2017-11-02 DIAGNOSIS — G629 Polyneuropathy, unspecified: Secondary | ICD-10-CM

## 2017-11-02 LAB — URINALYSIS, COMPLETE
BILIRUBIN UA: NEGATIVE
KETONES UA: NEGATIVE
LEUKOCYTES UA: NEGATIVE
Nitrite, UA: NEGATIVE
PROTEIN UA: NEGATIVE
SPEC GRAV UA: 1.025 (ref 1.005–1.030)
UUROB: 0.2 mg/dL (ref 0.2–1.0)
pH, UA: 5 (ref 5.0–7.5)

## 2017-11-02 LAB — MICROSCOPIC EXAMINATION

## 2017-11-02 LAB — WET PREP FOR TRICH, YEAST, CLUE
Clue Cell Exam: POSITIVE — AB
TRICHOMONAS EXAM: NEGATIVE
YEAST EXAM: NEGATIVE

## 2017-11-02 LAB — BAYER DCA HB A1C WAIVED: HB A1C (BAYER DCA - WAIVED): 6.9 % (ref <7.0)

## 2017-11-02 LAB — LIPID PANEL
CHOLESTEROL TOTAL: 159 mg/dL (ref 100–199)
Chol/HDL Ratio: 7.2 ratio — ABNORMAL HIGH (ref 0.0–4.4)
HDL: 22 mg/dL — ABNORMAL LOW (ref >39)
LDL Calculated: 87 mg/dL (ref 0–99)
Triglycerides: 251 mg/dL — ABNORMAL HIGH (ref 0–149)
VLDL CHOLESTEROL CAL: 50 mg/dL — AB (ref 5–40)

## 2017-11-02 MED ORDER — GABAPENTIN 100 MG PO CAPS: 100.0000 mg | ORAL_CAPSULE | Freq: Three times a day (TID) | ORAL | 2 refills | Status: DC

## 2017-11-02 MED ORDER — NAPROXEN 500 MG PO TABS: 500.0000 mg | ORAL_TABLET | Freq: Two times a day (BID) | ORAL | 2 refills | Status: DC

## 2017-11-02 MED ORDER — DAPAGLIFLOZIN PROPANEDIOL 10 MG PO TABS: 10.0000 mg | ORAL_TABLET | Freq: Every day | ORAL | 1 refills | Status: DC

## 2017-11-02 MED ORDER — SITAGLIPTIN PHOSPHATE 100 MG PO TABS: 100.0000 mg | ORAL_TABLET | Freq: Every day | ORAL | 2 refills | Status: DC

## 2017-11-02 MED ORDER — METRONIDAZOLE 500 MG PO TABS: 500.0000 mg | ORAL_TABLET | Freq: Two times a day (BID) | ORAL | 0 refills | Status: DC

## 2017-11-02 NOTE — Progress Notes (Signed)
Subjective:   Patient ID: Lisa Norton, female    DOB: [birth year], 47 y.o.   MRN: [id number] CC: Follow-up (3 month); Vaginal Itching; and Vaginal Discharge  HPI: Lisa Norton is a 47 y.o. female presenting for Follow-up (3 month); Vaginal Itching; and Vaginal Discharge  DM2: taking farxiga and [place].   Past few days has had some itching in vaginal area. No discharge. No rash. No recent antibiotics.  Saw gynecology, had rectocele, urethrocele. Planning for surgery likely, has appt with surgeon next week. Still feeling pelvic pressure.  Healing well from foot surgery. Ankle pain better. Still with some heel pain R foot that limits her wlaking. Open to other forms of exercise ie riding stationary bike. Not eating snack foods, avoiding sugary drinks. Has 34mo granddaughter  Relevant past medical, surgical, family and social history reviewed. Allergies and medications reviewed and updated. Social History   Tobacco Use  Smoking Status Current Every Day Smoker  . Packs/day: 1.50  . Years: 20.00  . Pack years: 30.00  . Types: Cigarettes  Smokeless Tobacco Former Systems developer  . Types: Snuff  . Quit date: 05/05/1986   ROS: Per HPI   Objective:    BP 131/89   Pulse 74   Temp (!) 97.3 F (36.3 C) (Oral)   Ht 5' 1.5" (1.562 m)   Wt 243 lb (110.2 kg)   BMI 45.17 kg/m   Wt Readings from Last 3 Encounters:  11/02/17 243 lb (110.2 kg)  09/15/17 233 lb 3.2 oz (105.8 kg)  09/12/17 233 lb (105.7 kg)    Gen: NAD, alert, cooperative with exam, NCAT EYES: EOMI, no conjunctival injection, or no icterus CV: NRRR, normal S1/S2, no murmur, distal pulses 2+ b/l Resp: CTABL, no wheezes, normal WOB Abd: +BS, soft, NTND Ext: No edema, warm Neuro: Alert and oriented MSK: normal muscle bulk Skin: R ankle well-healing scar, no redness  Assessment & Plan:  Chrisette was seen today for follow-up, vaginal itching and vaginal discharge.  Diagnoses and all orders for this visit:  Type 2 diabetes  mellitus with hyperglycemia, unspecified whether long term insulin use (HCC) -     Bayer DCA Hb A1c Waived -     Microalbumin / creatinine urine ratio -     sitaGLIPtin (JANUVIA) 100 MG tablet; Take 1 tablet (100 mg total) by mouth daily.  Vaginal discharge + wet prep for BV -     WET PREP FOR TRICH, YEAST, CLUE -     Urinalysis, Complete -     Urine Culture; Future  Vaginal itching -     WET PREP FOR TRICH, YEAST, CLUE -     Urinalysis, Complete -     Urine Culture; Future  Type 2 diabetes mellitus without complication, without long-term current use of insulin (HCC) A1c 6.9, cont farxiga, januvia -     dapagliflozin propanediol (FARXIGA) 10 MG TABS tablet; Take 10 mg by mouth daily.  Neuropathy Stable, cont below -     gabapentin (NEURONTIN) 100 MG capsule; Take 1 capsule (100 mg total) by mouth 3 (three) times daily.  Plantar fasciitis -     naproxen (NAPROSYN) 500 MG tablet; Take 1 tablet (500 mg total) by mouth 2 (two) times daily with a meal.  Bacterial vaginosis -     metroNIDAZOLE (FLAGYL) 500 MG tablet; Take 1 tablet (500 mg total) by mouth 2 (two) times daily.  Hyperlipidemia associated with type 2 diabetes mellitus (HCC) -     Lipid panel  Class  3 severe obesity with serious comorbidity and body mass index (BMI) of 45.0 to 49.9 in adult, unspecified obesity type ([place]) Discussed lifestyle changes. Has been referred to weight management, pt open to going, gave phone number again.  Follow up plan: Return in about 3 months (around 02/02/2018). Assunta Found, MD [place]

## 2017-11-02 NOTE — Patient Instructions (Signed)
From recent GI visit:  "We are referring you to a Weight management clinic. [place], # [id number],  Phone [phone]. They will be calling you with an appointment.  You have been scheduled for a gastric emptying scan at [facility] on Friday 09-02-2017 at 7:30 am. Please arrive at 7:15 am to your appointment for registration. Please make certain not to have anything to eat or drink after midnight the night before your test. Hold all stomach medications (ex: Zofran, phenergan, Reglan, Protonix ) 6 hours prior to your test. If you need to reschedule your appointment, please contact radiology scheduling at [phone]."

## 2017-11-03 ENCOUNTER — Encounter: Payer: Self-pay | Admitting: Physical Therapy

## 2017-11-03 ENCOUNTER — Ambulatory Visit: Payer: Medicaid Other | Attending: Podiatry | Admitting: Physical Therapy

## 2017-11-03 DIAGNOSIS — M79671 Pain in right foot: Secondary | ICD-10-CM | POA: Diagnosis present

## 2017-11-03 DIAGNOSIS — M25671 Stiffness of right ankle, not elsewhere classified: Secondary | ICD-10-CM | POA: Diagnosis present

## 2017-11-03 DIAGNOSIS — R600 Localized edema: Secondary | ICD-10-CM | POA: Diagnosis present

## 2017-11-03 LAB — MICROALBUMIN / CREATININE URINE RATIO
CREATININE, UR: 89.7 mg/dL
MICROALB/CREAT RATIO: 6.1 mg/g{creat} (ref 0.0–30.0)
MICROALBUM., U, RANDOM: 5.5 ug/mL

## 2017-11-03 NOTE — Therapy (Signed)
[facility] [place], [place], [address] Phone: [phone]   Fax:  [phone]  Physical Therapy Evaluation  Patient Details  Name: Lisa Norton MRN: [id number] Date of Birth: 01-Apr-1971 Referring Provider: Daylene Katayama   Encounter Date: 11/03/2017  PT End of Session - 11/03/17 1459    Visit Number  1    Number of Visits  15    Date for PT Re-Evaluation  12/29/17    PT Start Time  0235    PT Stop Time  0304    PT Time Calculation (min)  29 min    Activity Tolerance  Patient tolerated treatment well    Behavior During Therapy  [facility] for tasks assessed/performed       Past Medical History:  Diagnosis Date  . Anxiety   . COLD SORE 05/15/2010  . DEPRESSION 12/12/2009  . Diabetes mellitus without complication ([place])   . High cholesterol   . 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 HYPERTENSION 12/12/2009  . TOBACCO ABUSE 12/12/2009    Past Surgical History:  Procedure Laterality Date  . CERVICAL FUSION    . CESAREAN SECTION WITH BILATERAL TUBAL LIGATION Bilateral 02/12/2015   Procedure: CESAREAN SECTION;  Surgeon: Florian Buff, MD;  Location: [facility];  Service: Obstetrics;  Laterality: Bilateral;  Fetal Demise  . CHOLECYSTECTOMY  2006  . FOOT SURGERY Right    Plantar, bone spurs  . TONSILLECTOMY  1988    There were no vitals filed for this visit.   Subjective Assessment - 11/03/17 1502    Subjective  The patient underwent right ankle and foot surgery for right ankle pain with synovitis and plantar fasciitis on 09/29/17. The patient states the doctor removed a large bone spur from her right ankle.  She reports she has been rolling her ankle since childhhod.  Her resting pain-level is a 4/10 today but her pain can easily rise to to a 7+/10 with prolonged standing and walking.  She is wearing a compression stocking to reduce swelling.  She reports one of herincisional sites is very tender but her doctor is not concerned.  Rest decreases  her pain.    Pertinent History  Multiple sprained ankles; DM.    Limitations  Standing    How long can you stand comfortably?  Less than 10 minutes.    How long can you walk comfortably?  Short community distances.    Patient Stated Goals  Walk and stand without right foot pain.    Currently in Pain?  Yes    Pain Score  4     Pain Location  -- Right ankle and foot.    Pain Orientation  Right    Pain Descriptors / Indicators  Throbbing    Pain Type  Chronic pain    Pain Onset  More than a month ago    Pain Frequency  Constant    Aggravating Factors   See above.    Pain Relieving Factors  See above.         [facility] PT Assessment - 11/03/17 0001      Assessment   Medical Diagnosis  Right plantar fasciitis    Referring Provider  Daylene Katayama    Onset Date/Surgical Date  -- 09/29/17 (surgery date).      Precautions   Precautions  None      Restrictions   Weight Bearing Restrictions  No      Balance Screen   Has the patient fallen in the  past 6 months  No    Has the patient had a decrease in activity level because of a fear of falling?   No    Is the patient reluctant to leave their home because of a fear of falling?   No      Home Environment   Living Environment  Private residence      Prior Function   Level of Independence  Independent      Posture/Postural Control   Posture Comments  Bilateral genu recurvatum.      ROM / Strength   AROM / PROM / Strength  AROM      AROM   Overall AROM Comments  Right ankle dorsiflexion with knee in full extension= 2 degrees and with knee flexed to 5 degrees; right ankle active inversion= 20 degrees; eversion= 5 degrees and plantarflexion= 30 degrees.      Palpation   Palpation comment  Very tender to palpation over right lateral heel incision and at heel as well as tenderness over right medial ankle incision.      Special Tests   Other special tests  Circumferential measurement at bilateral malleoli region is 3.5 cms > on right than  left.      Ambulation/Gait   Gait Comments  Normal footwear donned with compression stocking.  Mild gait antalgia observed with minimal decrease in stance time over right LE.             Objective measurements completed on examination: See above findings.                   PT Long Term Goals - 11/03/17 1524      PT LONG TERM GOAL #1   Title  Ind with a HEP.    Baseline  No knowledge of appropriate ther ex.    Time  8    Period  Weeks    Status  New      PT LONG TERM GOAL #2   Title  Active right ankle dorsiflexion to 8 degrees to help normalize gait pattern.    Baseline  2 degrees of right ankle dorsiflexion.    Time  8    Period  Weeks    Status  New      PT LONG TERM GOAL #3   Title  Stand 30 minuteds with pain not > 2-3/10.    Baseline  Patient cannot stand 10 minutes without pain rising to 7+/10.    Time  8    Period  Weeks    Status  New      PT LONG TERM GOAL #4   Title  Walk a community distance wiht pain not > 2-3/10.    Baseline  Patient can only walk short distnaces before pain exceeds 7+/10.    Time  8    Period  Weeks    Status  New             Plan - 11/03/17 1519    Clinical Impression Statement  The patient presents to OPPT s/p right ankle and foot surgery performed on 09/29/17.  She has a  significant loss of right ankle range of motion and edema.  She is quite tender to palpation over her right heel and 2 incisional sites.  She does reports that her plantar fascia and ankle feel better since surgery.  Her pain limites her from prolonged standing and walking.    History and Personal Factors relevant to plan of care:  DM.    Clinical Presentation  Stable    Clinical Presentation due to:  Good surgical outcome.    Clinical Decision Making  Low    Rehab Potential  Good    PT Frequency  -- 3 visits for onemonth per authorization period f/b 2 times a week for 6 weeks.    PT Treatment/Interventions  ADLs/Self Care Home  Management;Cryotherapy;Occupational psychologist;Therapeutic activities;Therapeutic exercise;Neuromuscular re-education;Manual techniques;Passive range of motion;Vasopneumatic Device    PT Next Visit Plan  Gentle right PROM; seated Rockerboard and BAPS with progression to standing; gastroc-soleus stretching; STW/M; vasopneumatic and electrical stimulation.    Consulted and Agree with Plan of Care  Patient       Patient will benefit from skilled therapeutic intervention in order to improve the following deficits and impairments:  Abnormal gait, Decreased activity tolerance, Decreased range of motion, Increased edema, Pain  Visit Diagnosis: Pain in right foot - Plan: PT plan of care cert/re-cert  Stiffness of right ankle, not elsewhere classified - Plan: PT plan of care cert/re-cert  Localized edema - Plan: PT plan of care cert/re-cert     Problem List Patient Active Problem List   Diagnosis Date Noted  . S/P cesarean section 02/12/2015  . Fetal demise, greater than 22 weeks, antepartum   . 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 Obesity 08/13/2014  . History of hypertension 08/12/2014  . C7 radiculopathy 02/11/2014  . Dyslipidemia 01/04/2011  . Type 2 diabetes mellitus ([place]) 01/04/2011  . TOBACCO ABUSE 12/12/2009  . DEPRESSION 12/12/2009  . Essential hypertension 12/12/2009    [place], [place] MPT 11/03/2017, 3:28 PM  [facility] [address] [place], [place], [address] Phone: [phone]   Fax:  [phone]  Name: Lisa Norton MRN: [id number] Date of Birth: [birth year]

## 2017-11-07 NOTE — Progress Notes (Signed)
Endoscopic plantar fasciotomy bilateral. Ankle arthroscopy right. Thermocapsuler repair right ankle.

## 2017-11-09 ENCOUNTER — Encounter: Payer: Self-pay | Admitting: Pediatrics

## 2017-11-09 DIAGNOSIS — Z720 Tobacco use: Secondary | ICD-10-CM

## 2017-11-09 MED ORDER — VARENICLINE TARTRATE 0.5 MG X 11 & 1 MG X 42 PO MISC: ORAL | 0 refills | Status: DC

## 2017-11-10 ENCOUNTER — Encounter: Payer: Self-pay | Admitting: Pediatrics

## 2017-11-11 ENCOUNTER — Encounter: Payer: Self-pay | Admitting: Pediatrics

## 2017-11-11 MED ORDER — FLUCONAZOLE 150 MG PO TABS: 150.0000 mg | ORAL_TABLET | Freq: Once | ORAL | 0 refills | Status: AC

## 2017-11-21 ENCOUNTER — Ambulatory Visit: Payer: Medicaid Other | Admitting: Physical Therapy

## 2017-11-21 ENCOUNTER — Encounter: Payer: Self-pay | Admitting: Physical Therapy

## 2017-11-21 DIAGNOSIS — R600 Localized edema: Secondary | ICD-10-CM

## 2017-11-21 DIAGNOSIS — M79671 Pain in right foot: Secondary | ICD-10-CM | POA: Diagnosis not present

## 2017-11-21 DIAGNOSIS — M25671 Stiffness of right ankle, not elsewhere classified: Secondary | ICD-10-CM

## 2017-11-21 NOTE — Therapy (Signed)
[facility] [place], [place], [address] Phone: [phone]   Fax:  [phone]  Physical Therapy Treatment  Patient Details  Name: Lisa Norton MRN: [id number] Date of Birth: [birth year] Referring Provider: Daylene Katayama   Encounter Date: 11/21/2017  PT End of Session - 11/21/17 0824    Visit Number  2    Number of Visits  15    Date for PT Re-Evaluation  12/29/17    PT Start Time  0820    PT Stop Time  0910    PT Time Calculation (min)  50 min    Activity Tolerance  Patient tolerated treatment well    Behavior During Therapy  [facility] for tasks assessed/performed       Past Medical History:  Diagnosis Date  . Anxiety   . COLD SORE 05/15/2010  . DEPRESSION 12/12/2009  . Diabetes mellitus without complication ([place])   . High cholesterol   . 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 HYPERTENSION 12/12/2009  . TOBACCO ABUSE 12/12/2009    Past Surgical History:  Procedure Laterality Date  . CERVICAL FUSION    . CESAREAN SECTION WITH BILATERAL TUBAL LIGATION Bilateral 02/12/2015   Procedure: CESAREAN SECTION;  Surgeon: Florian Buff, MD;  Location: [facility];  Service: Obstetrics;  Laterality: Bilateral;  Fetal Demise  . CHOLECYSTECTOMY  2006  . FOOT SURGERY Right    Plantar, bone spurs  . TONSILLECTOMY  1988    There were no vitals filed for this visit.  Subjective Assessment - 11/21/17 3419    Subjective  Reports that her R foot is still very sore and her anterior foot incision is still very tender.     Pertinent History  Multiple sprained ankles; DM.    Limitations  Standing    How long can you stand comfortably?  Less than 10 minutes.    How long can you walk comfortably?  Short community distances.    Patient Stated Goals  Walk and stand without right foot pain.    Currently in Pain?  Yes    Pain Score  4     Pain Location  Foot    Pain Orientation  Right    Pain Descriptors / Indicators  Sore    Pain Type  Chronic pain     Pain Onset  More than a month ago    Pain Frequency  Constant         [facility] PT Assessment - 11/21/17 0001      Assessment   Medical Diagnosis  Right plantar fasciitis    Onset Date/Surgical Date  09/29/17      Precautions   Precautions  None      Restrictions   Weight Bearing Restrictions  No            No data recorded       [facility] Adult PT Treatment/Exercise - 11/21/17 0001      Exercises   Exercises  Ankle      Modalities   Modalities  Electrical Stimulation;Vasopneumatic      Electrical Stimulation   Electrical Stimulation Location  R ankle    Electrical Stimulation Action  Pre-Mod    Electrical Stimulation Parameters  80-150 hz x15 min    Electrical Stimulation Goals  Pain;Edema      Vasopneumatic   Number Minutes Vasopneumatic   15 minutes    Vasopnuematic Location   Ankle    Vasopneumatic Pressure  Medium    Vasopneumatic Temperature  60      Ankle Exercises: Stretches   Soleus Stretch  3 reps;30 seconds    Gastroc Stretch  3 reps;30 seconds      Ankle Exercises: Seated   ABC's  1 rep    Towel Inversion/Eversion  Other (comment) x15 reps    Heel Raises  Right;20 reps    Toe Raise  20 reps    BAPS  Sitting;Level 2;15 reps    Other Seated Ankle Exercises  R ankle rockerboard A/P x4 min, inv/ev x4 min             PT Education - 11/21/17 1749    Education provided  Yes    Education Details  HEP- heel/toe raises, ABCs, towel inv/ev    Person(s) Educated  Patient    Methods  Explanation;Handout    Comprehension  Verbalized understanding          PT Long Term Goals - 11/03/17 1524      PT LONG TERM GOAL #1   Title  Ind with a HEP.    Baseline  No knowledge of appropriate ther ex.    Time  8    Period  Weeks    Status  New      PT LONG TERM GOAL #2   Title  Active right ankle dorsiflexion to 8 degrees to help normalize gait pattern.    Baseline  2 degrees of right ankle dorsiflexion.    Time  8    Period  Weeks    Status   New      PT LONG TERM GOAL #3   Title  Stand 30 minuteds with pain not > 2-3/10.    Baseline  Patient cannot stand 10 minutes without pain rising to 7+/10.    Time  8    Period  Weeks    Status  New      PT LONG TERM GOAL #4   Title  Walk a community distance wiht pain not > 2-3/10.    Baseline  Patient can only walk short distnaces before pain exceeds 7+/10.    Time  8    Period  Weeks    Status  New            Plan - 11/21/17 0914    Clinical Impression Statement  Patient tolerated today's treatment well although she reports increased soreness and tenderness and lack of ROM. Patient guided through more ROM directed exercises and gentle strengthening with only complaint of lack of ROM into inversion and DF. Patient still very tender along anteiror R ankle incision and encouraged to try genlte scar mobilizations following education in technique. Normal modalities response noted following removal of the modalities.    Rehab Potential  Good    PT Treatment/Interventions  ADLs/Self Care Home Management;Cryotherapy;Occupational psychologist;Therapeutic activities;Therapeutic exercise;Neuromuscular re-education;Manual techniques;Passive range of motion;Vasopneumatic Device    PT Next Visit Plan  Gentle right PROM; seated Rockerboard and BAPS with progression to standing; gastroc-soleus stretching; STW/M; vasopneumatic and electrical stimulation.    PT Home Exercise Plan  HEP- heel/toe raises, ABCs, towel inv/ev    Consulted and Agree with Plan of Care  Patient       Patient will benefit from skilled therapeutic intervention in order to improve the following deficits and impairments:  Abnormal gait, Decreased activity tolerance, Decreased range of motion, Increased edema, Pain  Visit Diagnosis: Pain in right foot  Stiffness of right ankle, not elsewhere classified  Localized edema  Problem List Patient Active Problem List   Diagnosis Date Noted   . S/P cesarean section 02/12/2015  . Fetal demise, greater than 22 weeks, antepartum   . 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 Obesity 08/13/2014  . History of hypertension 08/12/2014  . C7 radiculopathy 02/11/2014  . Dyslipidemia 01/04/2011  . Type 2 diabetes mellitus ([place]) 01/04/2011  . TOBACCO ABUSE 12/12/2009  . DEPRESSION 12/12/2009  . Essential hypertension 12/12/2009    Standley Brooking, PTA 11/21/2017, 9:23 AM  [facility] [address] [place], [place], [address] Phone: [phone]   Fax:  [phone]  Name: Lisa Norton MRN: [id number] Date of Birth: Mar 07, 1971

## 2017-11-28 ENCOUNTER — Ambulatory Visit: Payer: Medicaid Other | Attending: Podiatry | Admitting: Physical Therapy

## 2017-11-28 ENCOUNTER — Encounter: Payer: Self-pay | Admitting: Physical Therapy

## 2017-11-28 DIAGNOSIS — M79671 Pain in right foot: Secondary | ICD-10-CM | POA: Insufficient documentation

## 2017-11-28 DIAGNOSIS — M25671 Stiffness of right ankle, not elsewhere classified: Secondary | ICD-10-CM | POA: Diagnosis present

## 2017-11-28 DIAGNOSIS — R600 Localized edema: Secondary | ICD-10-CM | POA: Insufficient documentation

## 2017-11-28 NOTE — Therapy (Signed)
[facility] [place], [place], [address] Phone: [phone]   Fax:  [phone]  Physical Therapy Treatment  Patient Details  Name: Lisa Norton MRN: [id number] Date of Birth: 18-Sep-1970 Referring Provider: Daylene Katayama   Encounter Date: 11/28/2017  PT End of Session - 11/28/17 0813    Visit Number  3    Number of Visits  15    Date for PT Re-Evaluation  12/29/17    PT Start Time  0816    PT Stop Time  0859    PT Time Calculation (min)  43 min    Activity Tolerance  Patient tolerated treatment well    Behavior During Therapy  [facility] for tasks assessed/performed       Past Medical History:  Diagnosis Date  . Anxiety   . COLD SORE 05/15/2010  . DEPRESSION 12/12/2009  . Diabetes mellitus without complication ([place])   . High cholesterol   . 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 HYPERTENSION 12/12/2009  . TOBACCO ABUSE 12/12/2009    Past Surgical History:  Procedure Laterality Date  . CERVICAL FUSION    . CESAREAN SECTION WITH BILATERAL TUBAL LIGATION Bilateral 02/12/2015   Procedure: CESAREAN SECTION;  Surgeon: Florian Buff, MD;  Location: [facility];  Service: Obstetrics;  Laterality: Bilateral;  Fetal Demise  . CHOLECYSTECTOMY  2006  . FOOT SURGERY Right    Plantar, bone spurs  . TONSILLECTOMY  1988    There were no vitals filed for this visit.  Subjective Assessment - 11/28/17 0813    Subjective  Reports limited activities secondary to increased R ankle soreness. Reporting intermittant sharp pain in R arch/heel.    Pertinent History  Multiple sprained ankles; DM.    Limitations  Standing    How long can you stand comfortably?  Less than 10 minutes.    How long can you walk comfortably?  Short community distances.    Patient Stated Goals  Walk and stand without right foot pain.    Currently in Pain?  Yes    Pain Score  5     Pain Location  Ankle    Pain Orientation  Right    Pain Descriptors / Indicators  Sore    Pain  Type  Chronic pain    Pain Onset  More than a month ago    Pain Frequency  Constant    Aggravating Factors   Certain steps or movements    Pain Relieving Factors  Rest         [facility] PT Assessment - 11/28/17 0001      Assessment   Medical Diagnosis  Right plantar fasciitis    Onset Date/Surgical Date  09/29/17      Precautions   Precautions  None      Restrictions   Weight Bearing Restrictions  No            No data recorded       [facility] Adult PT Treatment/Exercise - 11/28/17 0001      Modalities   Modalities  Electrical Stimulation;Vasopneumatic      Electrical Stimulation   Electrical Stimulation Location  R ankle    Electrical Stimulation Action  IFC    Electrical Stimulation Parameters  1-10 hz x15 min    Electrical Stimulation Goals  Pain;Edema      Vasopneumatic   Number Minutes Vasopneumatic   15 minutes    Vasopnuematic Location   Ankle    Vasopneumatic Pressure  Medium  Vasopneumatic Temperature   34      Ankle Exercises: Seated   ABC's  1 rep    Ankle Circles/Pumps  AROM;Right;20 reps    Towel Crunch  Other (comment) x15 reps    Towel Inversion/Eversion  Other (comment) x20 reps    Heel Raises  Right;20 reps    Toe Raise  20 reps    BAPS  Sitting;Level 2;Other (comment) x20 rep    Other Seated Ankle Exercises  R ankle rockerboard A/P x3 min, inv/ev x4 min    Other Seated Ankle Exercises  R ankle dynadisc circles                   PT Long Term Goals - 11/03/17 1524      PT LONG TERM GOAL #1   Title  Ind with a HEP.    Baseline  No knowledge of appropriate ther ex.    Time  8    Period  Weeks    Status  New      PT LONG TERM GOAL #2   Title  Active right ankle dorsiflexion to 8 degrees to help normalize gait pattern.    Baseline  2 degrees of right ankle dorsiflexion.    Time  8    Period  Weeks    Status  New      PT LONG TERM GOAL #3   Title  Stand 30 minuteds with pain not > 2-3/10.    Baseline  Patient cannot  stand 10 minutes without pain rising to 7+/10.    Time  8    Period  Weeks    Status  New      PT LONG TERM GOAL #4   Title  Walk a community distance wiht pain not > 2-3/10.    Baseline  Patient can only walk short distnaces before pain exceeds 7+/10.    Time  8    Period  Weeks    Status  New            Plan - 11/28/17 6834    Clinical Impression Statement  Patient presented in clinic with reports of increased R ankle soreness and intermittant sharp R  ankle/arch pains. Patient guided through seated R ankle AROM and proprioceptive exercises with complaints of increased pain with ankle inversion exercises. Patient's R dorsal foot appeared swollen along with medial ankle as well. Normal modalities response noted following removal of the modalities.    Rehab Potential  Good    PT Treatment/Interventions  ADLs/Self Care Home Management;Cryotherapy;Occupational psychologist;Therapeutic activities;Therapeutic exercise;Neuromuscular re-education;Manual techniques;Passive range of motion;Vasopneumatic Device    PT Next Visit Plan  Gentle right PROM; seated Rockerboard and BAPS with progression to standing; gastroc-soleus stretching; STW/M; vasopneumatic and electrical stimulation.    PT Home Exercise Plan  HEP- heel/toe raises, ABCs, towel inv/ev    Consulted and Agree with Plan of Care  Patient       Patient will benefit from skilled therapeutic intervention in order to improve the following deficits and impairments:  Abnormal gait, Decreased activity tolerance, Decreased range of motion, Increased edema, Pain  Visit Diagnosis: Pain in right foot  Stiffness of right ankle, not elsewhere classified  Localized edema     Problem List Patient Active Problem List   Diagnosis Date Noted  . S/P cesarean section 02/12/2015  . Fetal demise, greater than 22 weeks, antepartum   . 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 Obesity 08/13/2014  . History of hypertension  08/12/2014  . C7 radiculopathy 02/11/2014  . Dyslipidemia 01/04/2011  . Type 2 diabetes mellitus ([place]) 01/04/2011  . TOBACCO ABUSE 12/12/2009  . DEPRESSION 12/12/2009  . Essential hypertension 12/12/2009    Standley Brooking, PTA 11/28/2017, 9:01 AM  [facility] [address] [place], [place], [address] Phone: [phone]   Fax:  [phone]  Name: Lisa Norton MRN: [id number] Date of Birth: 1971-02-19

## 2017-11-29 ENCOUNTER — Other Ambulatory Visit: Payer: Self-pay | Admitting: Pediatrics

## 2017-11-29 DIAGNOSIS — Z720 Tobacco use: Secondary | ICD-10-CM

## 2017-11-30 ENCOUNTER — Encounter: Payer: Self-pay | Admitting: *Deleted

## 2017-12-05 ENCOUNTER — Ambulatory Visit: Payer: Medicaid Other | Admitting: Physical Therapy

## 2017-12-05 ENCOUNTER — Encounter: Payer: Self-pay | Admitting: Physical Therapy

## 2017-12-05 ENCOUNTER — Telehealth: Payer: Self-pay | Admitting: Podiatry

## 2017-12-05 DIAGNOSIS — R600 Localized edema: Secondary | ICD-10-CM

## 2017-12-05 DIAGNOSIS — M79671 Pain in right foot: Secondary | ICD-10-CM | POA: Diagnosis not present

## 2017-12-05 DIAGNOSIS — M25671 Stiffness of right ankle, not elsewhere classified: Secondary | ICD-10-CM

## 2017-12-05 NOTE — Therapy (Signed)
[facility] [place], [place], [address] Phone: [phone]   Fax:  [phone]  Physical Therapy Treatment  Patient Details  Name: Lisa Norton MRN: [id number] Date of Birth: 05/11/1971 Referring Provider: Daylene Katayama   Encounter Date: 12/05/2017  PT End of Session - 12/05/17 0822    Visit Number  4    Number of Visits  15    Date for PT Re-Evaluation  12/29/17    PT Start Time  0817    PT Stop Time  0903    PT Time Calculation (min)  46 min    Activity Tolerance  Patient tolerated treatment well    Behavior During Therapy  [facility] for tasks assessed/performed       Past Medical History:  Diagnosis Date  . Anxiety   . COLD SORE 05/15/2010  . DEPRESSION 12/12/2009  . Diabetes mellitus without complication ([place])   . High cholesterol   . 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 HYPERTENSION 12/12/2009  . TOBACCO ABUSE 12/12/2009    Past Surgical History:  Procedure Laterality Date  . CERVICAL FUSION    . CESAREAN SECTION WITH BILATERAL TUBAL LIGATION Bilateral 02/12/2015   Procedure: CESAREAN SECTION;  Surgeon: Florian Buff, MD;  Location: [facility];  Service: Obstetrics;  Laterality: Bilateral;  Fetal Demise  . CHOLECYSTECTOMY  2006  . FOOT SURGERY Right    Plantar, bone spurs  . TONSILLECTOMY  1988    There were no vitals filed for this visit.  Subjective Assessment - 12/05/17 0821    Subjective  Reports that she has had a lot of pain in the night. Reports that she has had to let the R ankle hang off the bed.    Pertinent History  Multiple sprained ankles; DM.    Limitations  Standing    How long can you stand comfortably?  Less than 10 minutes.    How long can you walk comfortably?  Short community distances.    Patient Stated Goals  Walk and stand without right foot pain.    Currently in Pain?  Yes    Pain Score  6     Pain Location  Ankle    Pain Orientation  Right    Pain Descriptors / Indicators  Discomfort;Sore    Pain Type  Chronic pain    Pain Onset  More than a month ago    Pain Frequency  Constant         [facility] PT Assessment - 12/05/17 0001      Assessment   Medical Diagnosis  Right plantar fasciitis    Onset Date/Surgical Date  09/29/17    Next MD Visit  Unknown      Precautions   Precautions  None      Restrictions   Weight Bearing Restrictions  No                   [facility] Adult PT Treatment/Exercise - 12/05/17 0001      Modalities   Modalities  Electrical Stimulation;Vasopneumatic      Electrical Stimulation   Electrical Stimulation Location  R ankle    Electrical Stimulation Action  Pre-Mod    Electrical Stimulation Parameters  80-150 hz x15 min    Electrical Stimulation Goals  Pain;Edema      Vasopneumatic   Number Minutes Vasopneumatic   15 minutes    Vasopnuematic Location   Ankle    Vasopneumatic Pressure  Medium    Vasopneumatic Temperature  34      Ankle Exercises: Seated   ABC's  1 rep    Heel Raises  Both;20 reps    Toe Raise  20 reps    BAPS  Sitting;Level 2;15 reps Ant/post, inv/ev, circles     Other Seated Ankle Exercises  R ankle rockerboard inv/ev x3 min    Other Seated Ankle Exercises  R ankle dynadisc ant/pos x3 min      Ankle Exercises: Standing   Rocker Board  3 minutes                  PT Long Term Goals - 11/03/17 1524      PT LONG TERM GOAL #1   Title  Ind with a HEP.    Baseline  No knowledge of appropriate ther ex.    Time  8    Period  Weeks    Status  New      PT LONG TERM GOAL #2   Title  Active right ankle dorsiflexion to 8 degrees to help normalize gait pattern.    Baseline  2 degrees of right ankle dorsiflexion.    Time  8    Period  Weeks    Status  New      PT LONG TERM GOAL #3   Title  Stand 30 minuteds with pain not > 2-3/10.    Baseline  Patient cannot stand 10 minutes without pain rising to 7+/10.    Time  8    Period  Weeks    Status  New      PT LONG TERM GOAL #4   Title  Walk a  community distance wiht pain not > 2-3/10.    Baseline  Patient can only walk short distnaces before pain exceeds 7+/10.    Time  8    Period  Weeks    Status  New            Plan - 12/05/17 0850    Clinical Impression Statement  Patient tolerated today's treatment with reports of increased R ankle pain overnight. Patient attempted rockerboard in standing today but all other exercises completed in sitting. Patient limited to R ankle DF but unable to determine what caused the limitation. Patient reported increased discomfort as exercises progressed. Normal modalities response noted following removal of the modalities.    Rehab Potential  Good    PT Treatment/Interventions  ADLs/Self Care Home Management;Cryotherapy;Occupational psychologist;Therapeutic activities;Therapeutic exercise;Neuromuscular re-education;Manual techniques;Passive range of motion;Vasopneumatic Device    PT Next Visit Plan  Gentle right PROM; seated Rockerboard and BAPS with progression to standing; gastroc-soleus stretching; STW/M; vasopneumatic and electrical stimulation.    PT Home Exercise Plan  HEP- heel/toe raises, ABCs, towel inv/ev    Consulted and Agree with Plan of Care  Patient       Patient will benefit from skilled therapeutic intervention in order to improve the following deficits and impairments:  Abnormal gait, Decreased activity tolerance, Decreased range of motion, Increased edema, Pain  Visit Diagnosis: Pain in right foot  Stiffness of right ankle, not elsewhere classified  Localized edema     Problem List Patient Active Problem List   Diagnosis Date Noted  . S/P cesarean section 02/12/2015  . Fetal demise, greater than 22 weeks, antepartum   . 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 Obesity 08/13/2014  . History of hypertension 08/12/2014  . C7 radiculopathy 02/11/2014  . Dyslipidemia 01/04/2011  . Type 2 diabetes mellitus ([place]) 01/04/2011  . TOBACCO  ABUSE  12/12/2009  . DEPRESSION 12/12/2009  . Essential hypertension 12/12/2009    Standley Brooking, PTA 12/05/2017, 9:07 AM  [facility] [address] [place], [place], [address] Phone: [phone]   Fax:  [phone]  Name: Lisa Norton MRN: [id number] Date of Birth: June 04, 1971

## 2017-12-05 NOTE — Telephone Encounter (Signed)
Pt is finished with Physical Therapy, just wondering if she needs to schedule an appt. To come back in to see you.

## 2017-12-06 ENCOUNTER — Other Ambulatory Visit: Payer: Self-pay | Admitting: Pediatrics

## 2017-12-06 DIAGNOSIS — Z720 Tobacco use: Secondary | ICD-10-CM

## 2017-12-06 MED ORDER — VARENICLINE TARTRATE 1 MG PO TABS: 1.0000 mg | ORAL_TABLET | Freq: Two times a day (BID) | ORAL | 1 refills | Status: DC

## 2017-12-06 NOTE — Telephone Encounter (Signed)
I sent in the continuing month Chantix instead of the starting month pack.  Is that okay?  Has she been taking the starting month and doing okay?

## 2017-12-06 NOTE — Telephone Encounter (Signed)
lmtcb

## 2017-12-16 DIAGNOSIS — N3946 Mixed incontinence: Secondary | ICD-10-CM | POA: Insufficient documentation

## 2017-12-22 ENCOUNTER — Encounter (INDEPENDENT_AMBULATORY_CARE_PROVIDER_SITE_OTHER): Payer: Medicaid Other

## 2018-01-04 ENCOUNTER — Ambulatory Visit (INDEPENDENT_AMBULATORY_CARE_PROVIDER_SITE_OTHER): Payer: Medicaid Other | Admitting: Family Medicine

## 2018-01-05 ENCOUNTER — Telehealth: Payer: Self-pay | Admitting: Pediatrics

## 2018-01-05 ENCOUNTER — Encounter: Payer: Self-pay | Admitting: Pediatrics

## 2018-01-05 DIAGNOSIS — N814 Uterovaginal prolapse, unspecified: Secondary | ICD-10-CM | POA: Insufficient documentation

## 2018-01-05 DIAGNOSIS — N816 Rectocele: Secondary | ICD-10-CM | POA: Insufficient documentation

## 2018-01-05 NOTE — Telephone Encounter (Signed)
appt scheduled for rck diabetes Pt notified

## 2018-01-09 ENCOUNTER — Ambulatory Visit: Payer: Medicaid Other | Admitting: Pediatrics

## 2018-01-09 ENCOUNTER — Encounter: Payer: Self-pay | Admitting: Pediatrics

## 2018-01-09 ENCOUNTER — Encounter: Payer: Self-pay | Admitting: *Deleted

## 2018-01-09 VITALS — BP 137/89 | HR 77 | Temp 97.2°F | Ht 61.5 in | Wt 237.4 lb

## 2018-01-09 DIAGNOSIS — E1165 Type 2 diabetes mellitus with hyperglycemia: Secondary | ICD-10-CM

## 2018-01-09 MED ORDER — INSULIN DETEMIR 100 UNIT/ML ~~LOC~~ SOLN: 15.0000 [IU] | Freq: Every day | SUBCUTANEOUS | 0 refills | Status: DC

## 2018-01-09 MED ORDER — INSULIN DETEMIR 100 UNIT/ML FLEXPEN: 15.0000 [IU] | Freq: Every day | SUBCUTANEOUS | 0 refills | Status: DC

## 2018-01-09 MED ORDER — EXENATIDE ER 2 MG ~~LOC~~ PEN: 2.0000 mg | PEN_INJECTOR | SUBCUTANEOUS | 1 refills | Status: DC

## 2018-01-09 NOTE — Progress Notes (Signed)
  Subjective:   Patient ID: Lisa Norton, female    DOB: [birth year], 47 y.o.   MRN: [id number] CC: Diabetes (Elevated A1C)  HPI: Lisa Norton is a 47 y.o. female   Due to prolapse is having hysterectomy upcoming.  A1c was elevated up to 8.5, needs improved blood sugar control prior to surgery is upcoming in the next 6 weeks.  Taking farxiga regularly.  Has been intolerant of metformin in the past.  Has been trying to control sugary food and beverage intake, decreasing overall. Has been on insulin in the past.  Relevant past medical, surgical, family and social history reviewed. Allergies and medications reviewed and updated. Social History   Tobacco Use  Smoking Status Current Every Day Smoker  . Packs/day: 1.50  . Years: 20.00  . Pack years: 30.00  . Types: Cigarettes  Smokeless Tobacco Former Systems developer  . Types: Snuff  . Quit date: 05/05/1986   ROS: Per HPI   Objective:    BP 137/89   Pulse 77   Temp (!) 97.2 F (36.2 C) (Oral)   Ht 5' 1.5" (1.562 m)   Wt 237 lb 6.4 oz (107.7 kg)   BMI 44.13 kg/m   Wt Readings from Last 3 Encounters:  01/09/18 237 lb 6.4 oz (107.7 kg)  11/02/17 243 lb (110.2 kg)  09/15/17 233 lb 3.2 oz (105.8 kg)    Gen: NAD, alert, cooperative with exam, NCAT EYES: EOMI, no conjunctival injection, or no icterus CV: NRRR, normal S1/S2, no murmur, distal pulses 2+ b/l Resp: CTABL, no wheezes, normal WOB Abd: +BS, soft, NTND. no guarding or organomegaly Ext: No edema, warm Neuro: Alert and oriented MSK: normal muscle bulk  Assessment & Plan:  Lisa Norton was seen today for diabetes.  Diagnoses and all orders for this visit:  Type 2 diabetes mellitus with hyperglycemia, unspecified whether long term insulin use ([place]) Need for improvd BGL prior to upcoming surgery. Will start once daily long acting insulin, also bydureon. Stop [place]. Start levemir 15 u at night -     Exenatide ER (BYDUREON) 2 MG PEN; Inject 2 mg into the skin once a week.  Follow up  plan: Return in about 3 weeks (around 01/30/2018). Assunta Found, MD [place]

## 2018-01-09 NOTE — Patient Instructions (Signed)
Stop Januvia.  Start once a week Bydureon injection.  Continue farxiga.  Start 15 units Levemir insulin once a day at night.  Check your blood sugars every morning.  Bring numbers to clinic visit 3 weeks.

## 2018-01-10 ENCOUNTER — Encounter: Payer: Self-pay | Admitting: Pediatrics

## 2018-01-10 DIAGNOSIS — E119 Type 2 diabetes mellitus without complications: Secondary | ICD-10-CM

## 2018-01-12 MED ORDER — INSULIN DETEMIR 100 UNIT/ML FLEXPEN: 15.0000 [IU] | PEN_INJECTOR | Freq: Every day | SUBCUTANEOUS | 11 refills | Status: DC

## 2018-01-12 MED ORDER — PEN NEEDLES 31G X 6 MM MISC: 1.0000 | Freq: Every day | 0 refills | Status: DC

## 2018-01-22 ENCOUNTER — Encounter: Payer: Self-pay | Admitting: Pediatrics

## 2018-01-25 ENCOUNTER — Encounter: Payer: Self-pay | Admitting: Pediatrics

## 2018-01-31 ENCOUNTER — Telehealth: Payer: Self-pay | Admitting: Pediatrics

## 2018-01-31 ENCOUNTER — Encounter: Payer: Self-pay | Admitting: Pediatrics

## 2018-01-31 DIAGNOSIS — E119 Type 2 diabetes mellitus without complications: Secondary | ICD-10-CM

## 2018-01-31 MED ORDER — INSULIN DETEMIR 100 UNIT/ML FLEXPEN: 18.0000 [IU] | PEN_INJECTOR | Freq: Every day | SUBCUTANEOUS | 11 refills | Status: DC

## 2018-01-31 MED ORDER — LIRAGLUTIDE 18 MG/3ML ~~LOC~~ SOPN: 0.6000 mg | PEN_INJECTOR | Freq: Every day | SUBCUTANEOUS | 1 refills | Status: DC

## 2018-01-31 NOTE — Telephone Encounter (Signed)
Called and d/w pt. levemir to 18u, cont GLP-1. Will try victoza rather than byetta due to needle discomfort.

## 2018-01-31 NOTE — Telephone Encounter (Signed)
Patient's insurance is requiring a prior authorization for the pharmacy to dispense 90 days of Victoza, however, they will allow a 30 day supply without authorization.  The pharmacist said for them to only dispense the 30 day, she will need the directions changed to direct the patient to start out as 0.1 and titrate up to 0.3.  Please advise and route to pools. They are aware you are out of the office until tomorrow.

## 2018-02-06 ENCOUNTER — Encounter: Payer: Self-pay | Admitting: Pediatrics

## 2018-02-06 ENCOUNTER — Ambulatory Visit: Payer: Medicaid Other | Admitting: Pediatrics

## 2018-02-06 ENCOUNTER — Other Ambulatory Visit: Payer: Self-pay | Admitting: Pediatrics

## 2018-02-06 VITALS — BP 137/88 | HR 76 | Temp 97.2°F | Ht 61.5 in | Wt 233.8 lb

## 2018-02-06 DIAGNOSIS — S00412A Abrasion of left ear, initial encounter: Secondary | ICD-10-CM

## 2018-02-06 DIAGNOSIS — E119 Type 2 diabetes mellitus without complications: Secondary | ICD-10-CM

## 2018-02-06 DIAGNOSIS — E1165 Type 2 diabetes mellitus with hyperglycemia: Secondary | ICD-10-CM | POA: Diagnosis not present

## 2018-02-06 DIAGNOSIS — K219 Gastro-esophageal reflux disease without esophagitis: Secondary | ICD-10-CM | POA: Diagnosis not present

## 2018-02-06 DIAGNOSIS — E785 Hyperlipidemia, unspecified: Secondary | ICD-10-CM

## 2018-02-06 LAB — BAYER DCA HB A1C WAIVED: HB A1C (BAYER DCA - WAIVED): 7.9 % — ABNORMAL HIGH (ref <7.0)

## 2018-02-06 MED ORDER — INSULIN DETEMIR 100 UNIT/ML ~~LOC~~ SOLN: 22.0000 [IU] | Freq: Every day | SUBCUTANEOUS | 1 refills | Status: DC

## 2018-02-06 MED ORDER — MUPIROCIN 2 % EX OINT: TOPICAL_OINTMENT | CUTANEOUS | 1 refills | Status: DC

## 2018-02-06 MED ORDER — LIRAGLUTIDE 18 MG/3ML ~~LOC~~ SOPN: PEN_INJECTOR | SUBCUTANEOUS | 1 refills | Status: DC

## 2018-02-06 MED ORDER — RANITIDINE HCL 150 MG PO TABS: 150.0000 mg | ORAL_TABLET | Freq: Every day | ORAL | 1 refills | Status: DC

## 2018-02-06 MED ORDER — PRAVASTATIN SODIUM 80 MG PO TABS: 80.0000 mg | ORAL_TABLET | Freq: Every day | ORAL | 1 refills | Status: DC

## 2018-02-06 MED ORDER — INSULIN DETEMIR 100 UNIT/ML FLEXPEN: 22.0000 [IU] | PEN_INJECTOR | Freq: Every day | SUBCUTANEOUS | 0 refills | Status: DC

## 2018-02-06 NOTE — Progress Notes (Signed)
Subjective:   Patient ID: Lisa Norton, female    DOB: October 23, 1970, 47 y.o.   MRN: [id number] CC: Medical Management of Chronic Issues  HPI: Lisa Norton is a 47 y.o. female   Upcoming hysterectomy planned at the end of the month.  Average blood sugar needs to be less than 8.5.  Last check was 8.6 a few weeks ago.  Patient has been making great efforts towards minimizing carbohydrates, trying to stay with less than 50 g of carbs a day.  She has not been drinking any sodas. Increased water intake. Morning blood sugars for the last few days have been over 150, less than 200.  She has been taking Levemir 18 units in the evening.  She has been on bydureon for 4 weeks, took her last dose yesterday. Due to difficulty with using the device and needle discomfort does not want to continue bydureon but is willing to try an alternate medicine that is similar.  Elevated BMI: 10 lb weight loss of last 3 months. Is open to nutrition if covered by insurance  She has a place on her left ear is been bothering her for about a month.  At one point she was able to get discharge out of it.  Been feeling sore.  Relevant past medical, surgical, family and social history reviewed. Allergies and medications reviewed and updated. Social History   Tobacco Use  Smoking Status Current Every Day Smoker  . Packs/day: 1.50  . Years: 20.00  . Pack years: 30.00  . Types: Cigarettes  Smokeless Tobacco Former Systems developer  . Types: Snuff  . Quit date: 05/05/1986   ROS: Per HPI   Objective:    BP 137/88   Pulse 76   Temp (!) 97.2 F (36.2 C) (Oral)   Ht 5' 1.5" (1.562 m)   Wt 233 lb 12.8 oz (106.1 kg)   BMI 43.46 kg/m   Wt Readings from Last 3 Encounters:  02/06/18 233 lb 12.8 oz (106.1 kg)  01/09/18 237 lb 6.4 oz (107.7 kg)  11/02/17 243 lb (110.2 kg)    Gen: NAD, alert, cooperative with exam, NCAT EYES: no conjunctival injection, or no icterus ENT:  L TM pearly gray, slight erythema along the outer helix of ear.   CV: NRRR, normal S1/S2, no murmur, distal pulses 2+ b/l Resp: CTABL, no wheezes, normal WOB Ext: No edema, warm Neuro: Alert and oriented, strength equal b/l UE and LE, coordination grossly normal MSK: normal muscle bulk  Assessment & Plan:  Lisa Norton was seen today for medical management of chronic issues.  Diagnoses and all orders for this visit:  Type 2 diabetes mellitus with hyperglycemia, unspecified whether long term insulin use (HCC) A1c 7.9. Continue Levemir, increase to 22 units in the evening.  Morning blood sugars are still elevated.  Start Victoza next week, last bydureon yesterday..  Follow-up in 4 weeks with blood sugar log. -     Bayer DCA Hb A1c Waived -     Amb ref to Medical Nutrition Therapy-MNT -     liraglutide (VICTOZA) 18 MG/3ML SOPN; Use 0.6 mg once a day for 1 week.  Then take 1.2 mg once a day for 1 week  Hyperlipidemia, unspecified hyperlipidemia type Stable, continue below -     pravastatin (PRAVACHOL) 80 MG tablet; Take 1 tablet (80 mg total) by mouth daily.  Gastroesophageal reflux disease, esophagitis presence not specified Stable, continue below -     ranitidine (ZANTAC) 150 MG tablet; Take 1 tablet (  150 mg total) by mouth daily.  Abrasion of left ear, initial encounter -     mupirocin ointment (BACTROBAN) 2 %; Use twice a day on affected areas   Follow up plan: Return in about 1 month (around 03/06/2018). Assunta Found, MD [place]

## 2018-02-08 ENCOUNTER — Telehealth: Payer: Self-pay | Admitting: Pediatrics

## 2018-02-08 ENCOUNTER — Other Ambulatory Visit: Payer: Self-pay | Admitting: Pediatrics

## 2018-02-08 DIAGNOSIS — E119 Type 2 diabetes mellitus without complications: Secondary | ICD-10-CM

## 2018-02-08 MED ORDER — LIRAGLUTIDE 18 MG/3ML ~~LOC~~ SOPN: 0.6000 mg | PEN_INJECTOR | Freq: Every day | SUBCUTANEOUS | 1 refills | Status: DC

## 2018-02-08 NOTE — Telephone Encounter (Signed)
It is on the preferred list after metformin, because she has failed metformin our office will need to fill out a prior authorization from the pharmacy for it but then she should be able to get the Victoza.  It might take a couple weeks for the prior authorization to go through.

## 2018-02-08 NOTE — Telephone Encounter (Signed)
Patient needs PA. I spoke with pharmacist and she attempted the 30 day supply but insurance denied as well. Patient is supposed to start Sunday. If we are not able to get it approved by Friday can we call and let her know what to do.

## 2018-02-08 NOTE — Telephone Encounter (Signed)
Pt aware PA approved for Victoza & sent to [place]

## 2018-02-09 ENCOUNTER — Encounter: Payer: Self-pay | Admitting: Pediatrics

## 2018-02-13 ENCOUNTER — Encounter: Payer: Self-pay | Admitting: Pediatrics

## 2018-02-16 HISTORY — PX: LAPAROSCOPIC VAGINAL HYSTERECTOMY WITH SALPINGECTOMY: SHX6680

## 2018-02-20 ENCOUNTER — Ambulatory Visit: Payer: Medicaid Other | Admitting: Pediatrics

## 2018-02-20 ENCOUNTER — Encounter: Payer: Self-pay | Admitting: Pediatrics

## 2018-02-20 VITALS — BP 104/73 | HR 91 | Temp 97.9°F | Ht 61.5 in | Wt 229.0 lb

## 2018-02-20 DIAGNOSIS — K5900 Constipation, unspecified: Secondary | ICD-10-CM

## 2018-02-20 DIAGNOSIS — K649 Unspecified hemorrhoids: Secondary | ICD-10-CM

## 2018-02-20 MED ORDER — HYDROCORTISONE 2.5 % RE CREA: 1.0000 "application " | TOPICAL_CREAM | Freq: Two times a day (BID) | RECTAL | 3 refills | Status: DC

## 2018-02-20 MED ORDER — POLYETHYLENE GLYCOL 3350 17 GM/SCOOP PO POWD: 17.0000 g | Freq: Two times a day (BID) | ORAL | 1 refills | Status: DC | PRN

## 2018-02-20 NOTE — Progress Notes (Signed)
  Subjective:   Patient ID: Lisa Norton, female    DOB: 1970-12-05, 47 y.o.   MRN: [id number] CC: Hemorrhoids  HPI: Lisa Norton is a 47 y.o. female   Hysterectomy last week.  For the last 3 days hemorrhoid pain has been bothering her the most.  She has had bowel movements regularly since surgery, sometimes just small stools, sometimes soft stools.  She has had to strain a fair amount to get stools out.  She is taking Benefiber and a stool softener every day.  She is taken pain medicine primarily for pain with hemorrhoids.  Still with some vaginal bloody discharge, but has been getting lighter.  She does have to stand to start emptying her bladder.  No fevers.  Appetite is been okay.  Hemorrhoid suppositories were sent in by her surgeon today, she is only tried it once.  Had to have help from her daughter because she was unable to find the rectum because of the hemorrhoids.  Relevant past medical, surgical, family and social history reviewed. Allergies and medications reviewed and updated. Social History   Tobacco Use  Smoking Status Current Every Day Smoker  . Packs/day: 1.50  . Years: 20.00  . Pack years: 30.00  . Types: Cigarettes  Smokeless Tobacco Former Systems developer  . Types: Snuff  . Quit date: 05/05/1986   ROS: Per HPI   Objective:    BP 104/73   Pulse 91   Temp 97.9 F (36.6 C) (Oral)   Ht 5' 1.5" (1.562 m)   Wt 229 lb (103.9 kg)   BMI 42.57 kg/m   Wt Readings from Last 3 Encounters:  02/20/18 229 lb (103.9 kg)  02/06/18 233 lb 12.8 oz (106.1 kg)  01/09/18 237 lb 6.4 oz (107.7 kg)    Gen: NAD, alert, cooperative with exam, NCAT EYES: no conjunctival injection, or no icterus CV: NRRR, normal S1/S2, no murmur, distal pulses 2+ b/l Resp: CTABL, no wheezes, normal WOB Abd: +BS, soft, NTND.  Ext: No edema, warm Neuro: Alert and oriented Rectal: several soft hemorrhoids present  Assessment & Plan:  Lisa Norton was seen today for hemorrhoids.  Diagnoses and all orders for  this visit:  Hemorrhoids, unspecified hemorrhoid type Sitz bath, rectal cream as below, cont stool softener, Benefiber, start MiraLAX.  Goal soft pudding consistency stools.  No straining.  Avoid narcotic pain medicine unless needed for surgical pain. -     hydrocortisone (ANUSOL-HC) 2.5 % rectal cream; Place 1 application rectally 2 (two) times daily.  Constipation, unspecified constipation type -     polyethylene glycol powder (GLYCOLAX/MIRALAX) powder; Take 17 g by mouth 2 (two) times daily as needed.   Follow up plan: Return if symptoms worsen or fail to improve. Assunta Found, MD [place]

## 2018-02-20 NOTE — Patient Instructions (Signed)
How to Take a Sitz Bath °A sitz bath is a warm water bath that is taken while you are sitting down. The water should only come up to your hips and should cover your buttocks. Your health care provider may recommend a sitz bath to help you: °· Clean the lower part of your body, including your genital area. °· With itching. °· With pain. °· With sore muscles or muscles that tighten or spasm. ° °How to take a sitz bath °Take 3-4 sitz baths per day or as told by your health care provider. °1. Partially fill a bathtub with warm water. You will only need the water to be deep enough to cover your hips and buttocks when you are sitting in it. °2. If your health care provider told you to put medicine in the water, follow the directions exactly. °3. Sit in the water and open the tub drain a little. °4. Turn on the warm water again to keep the tub at the correct level. Keep the water running constantly. °5. Soak in the water for 15-20 minutes or as told by your health care provider. °6. After the sitz bath, pat the affected area dry first. Do not rub it. °7. Be careful when you stand up after the sitz bath because you may feel dizzy. ° °Contact a health care provider if: °· Your symptoms get worse. Do not continue with sitz baths if your symptoms get worse. °· You have new symptoms. Do not continue with sitz baths until you talk with your health care provider. °This information is not intended to replace advice given to you by your health care provider. Make sure you discuss any questions you have with your health care provider. °Document Released: 05/08/2004 Document Revised: 01/14/2016 Document Reviewed: 08/14/2014 °Elsevier Interactive Patient Education © 2018 Elsevier Inc. ° °

## 2018-03-16 ENCOUNTER — Ambulatory Visit: Payer: Medicaid Other | Admitting: Pediatrics

## 2018-03-16 ENCOUNTER — Encounter: Payer: Self-pay | Admitting: Pediatrics

## 2018-03-16 VITALS — BP 131/86 | HR 83 | Temp 97.6°F | Ht 61.5 in | Wt 223.0 lb

## 2018-03-16 DIAGNOSIS — N390 Urinary tract infection, site not specified: Secondary | ICD-10-CM | POA: Diagnosis not present

## 2018-03-16 DIAGNOSIS — N898 Other specified noninflammatory disorders of vagina: Secondary | ICD-10-CM

## 2018-03-16 DIAGNOSIS — E1165 Type 2 diabetes mellitus with hyperglycemia: Secondary | ICD-10-CM

## 2018-03-16 DIAGNOSIS — N760 Acute vaginitis: Secondary | ICD-10-CM | POA: Diagnosis not present

## 2018-03-16 DIAGNOSIS — B9689 Other specified bacterial agents as the cause of diseases classified elsewhere: Secondary | ICD-10-CM

## 2018-03-16 DIAGNOSIS — R8281 Pyuria: Secondary | ICD-10-CM

## 2018-03-16 LAB — MICROSCOPIC EXAMINATION: RENAL EPITHEL UA: NONE SEEN /HPF

## 2018-03-16 LAB — WET PREP FOR TRICH, YEAST, CLUE
CLUE CELL EXAM: POSITIVE — AB
Trichomonas Exam: NEGATIVE
Yeast Exam: NEGATIVE

## 2018-03-16 LAB — URINALYSIS, COMPLETE
Bilirubin, UA: NEGATIVE
Glucose, UA: NEGATIVE
Ketones, UA: NEGATIVE
Nitrite, UA: NEGATIVE
PH UA: 5.5 (ref 5.0–7.5)
Specific Gravity, UA: 1.025 (ref 1.005–1.030)
Urobilinogen, Ur: 0.2 mg/dL (ref 0.2–1.0)

## 2018-03-16 MED ORDER — METRONIDAZOLE 500 MG PO TABS: 500.0000 mg | ORAL_TABLET | Freq: Two times a day (BID) | ORAL | 0 refills | Status: DC

## 2018-03-16 NOTE — Progress Notes (Signed)
  Subjective:   Patient ID: Lisa Norton, female    DOB: 1970/09/25, 47 y.o.   MRN: [id number] CC: No chief complaint on file.  HPI: Lisa Norton is a 47 y.o. female   DM2: BGLs fasting in the morning as high as 145, usually 110s. Tolerating medicines. Stopped levemir. Pleased with weight loss.  Vaginal itching: recent transvaginal hysterectomy. Past week has had more vaginal itching. No vaginal discharge. No fevers or worsening pain. SOmetmes has burning   Relevant past medical, surgical, family and social history reviewed. Allergies and medications reviewed and updated. Social History   Tobacco Use  Smoking Status Current Every Day Smoker  . Packs/day: 1.50  . Years: 20.00  . Pack years: 30.00  . Types: Cigarettes  Smokeless Tobacco Former Systems developer  . Types: Snuff  . Quit date: 05/05/1986   ROS: Per HPI   Objective:    BP 131/86   Pulse 83   Temp 97.6 F (36.4 C) (Oral)   Ht 5' 1.5" (1.562 m)   Wt 223 lb (101.2 kg)   BMI 41.45 kg/m   Wt Readings from Last 3 Encounters:  03/16/18 223 lb (101.2 kg)  02/20/18 229 lb (103.9 kg)  02/06/18 233 lb 12.8 oz (106.1 kg)    Gen: NAD, alert, cooperative with exam, NCAT EYES: EOMI, no conjunctival injection, or no icterus ENT:  TMs pearly gray b/l, OP without erythema LYMPH: no cervical LAD CV: NRRR, normal S1/S2, no murmur, distal pulses 2+ b/l Resp: CTABL, no wheezes, normal WOB Abd: +BS, soft, NTND.  Ext: No edema, warm Neuro: Alert and oriented MSK: normal muscle bulk  Assessment & Plan:  47yoF with BV. Treat as below.   Diagnoses and all orders for this visit:  DM2 Stable, Stop levemir, no longer taking, cont to check BGLs at home.   Vaginal itching -     Urinalysis, Complete -     WET PREP FOR TRICH, YEAST, CLUE  Vaginal discharge -     Urinalysis, Complete -     WET PREP FOR TRICH, YEAST, CLUE  Pyuria F/u culture -     Urine Culture  BV (bacterial vaginosis) -     metroNIDAZOLE (FLAGYL) 500 MG tablet;  Take 1 tablet (500 mg total) by mouth 2 (two) times daily.  Other orders -     Microscopic Examination   Follow up plan: Return in about 3 months (around 06/16/2018). Assunta Found, MD [place]

## 2018-03-20 LAB — URINE CULTURE

## 2018-03-22 ENCOUNTER — Other Ambulatory Visit: Payer: Self-pay | Admitting: Pediatrics

## 2018-03-22 ENCOUNTER — Ambulatory Visit: Payer: Medicaid Other | Admitting: Family Medicine

## 2018-03-22 ENCOUNTER — Encounter: Payer: Self-pay | Admitting: Family Medicine

## 2018-03-22 VITALS — BP 122/86 | HR 78 | Temp 97.2°F | Ht 61.5 in | Wt 220.0 lb

## 2018-03-22 DIAGNOSIS — M545 Low back pain, unspecified: Secondary | ICD-10-CM

## 2018-03-22 DIAGNOSIS — G629 Polyneuropathy, unspecified: Secondary | ICD-10-CM

## 2018-03-22 MED ORDER — NAPROXEN 500 MG PO TABS: 500.0000 mg | ORAL_TABLET | Freq: Two times a day (BID) | ORAL | 0 refills | Status: DC

## 2018-03-22 MED ORDER — CYCLOBENZAPRINE HCL 10 MG PO TABS: 10.0000 mg | ORAL_TABLET | Freq: Three times a day (TID) | ORAL | 0 refills | Status: DC | PRN

## 2018-03-22 NOTE — Progress Notes (Signed)
   HPI  Patient presents today here for back pain.  Patient explains that he still present for about 4 days.  She explains she has left-sided low back pain that radiates to her left lateral hip. She denies any saddle anesthesia, leg weakness, or bowel or bladder dysfunction.  She states that her left leg "does not feel right".  She has not tried any medications for this. She denies any injury.  PMH: Smoking status noted ROS: Per HPI  Objective: BP 122/86   Pulse 78   Temp (!) 97.2 F (36.2 C) (Oral)   Ht 5' 1.5" (1.562 m)   Wt 220 lb (99.8 kg)   BMI 40.90 kg/m  Gen: NAD, alert, cooperative with exam HEENT: NCAT CV: RRR, good S1/S2, no murmur Resp: CTABL, no wheezes, non-labored Ext: No edema, warm Neuro: Alert and oriented, negative modified straight leg raise MSK  Tenderness to palpation of left SI joint and left greater trochanter   Assessment and plan:  #Low back pain No sciatica, possible SI joint dysfunction, possible left greater trochanteric bursitis NSAIDs plus Flexeril Return to clinic or call back with any concerns No red flags on exam  Meds ordered this encounter  Medications  . naproxen (NAPROSYN) 500 MG tablet    Sig: Take 1 tablet (500 mg total) by mouth 2 (two) times daily with a meal.    Dispense:  60 tablet    Refill:  0  . cyclobenzaprine (FLEXERIL) 10 MG tablet    Sig: Take 1 tablet (10 mg total) by mouth 3 (three) times daily as needed for muscle spasms.    Dispense:  30 tablet    Refill:  0    Laroy Apple, MD [facility] 03/22/2018, 8:23 AM

## 2018-03-22 NOTE — Patient Instructions (Signed)
Great to meet you!   Back Pain, Adult Back pain is very common. The pain often gets better over time. The cause of back pain is usually not dangerous. Most people can learn to manage their back pain on their own. Follow these instructions at home: Watch your back pain for any changes. The following actions may help to lessen any pain you are feeling:  Stay active. Start with short walks on flat ground if you can. Try to walk farther each day.  Exercise regularly as told by your doctor. Exercise helps your back heal faster. It also helps avoid future injury by keeping your muscles strong and flexible.  Do not sit, drive, or stand in one place for more than 30 minutes.  Do not stay in bed. Resting more than 1-2 days can slow down your recovery.  Be careful when you bend or lift an object. Use good form when lifting: ? Bend at your knees. ? Keep the object close to your body. ? Do not twist.  Sleep on a firm mattress. Lie on your side, and bend your knees. If you lie on your back, put a pillow under your knees.  Take medicines only as told by your doctor.  Put ice on the injured area. ? Put ice in a plastic bag. ? Place a towel between your skin and the bag. ? Leave the ice on for 20 minutes, 2-3 times a day for the first 2-3 days. After that, you can switch between ice and heat packs.  Avoid feeling anxious or stressed. Find good ways to deal with stress, such as exercise.  Maintain a healthy weight. Extra weight puts stress on your back.  Contact a doctor if:  You have pain that does not go away with rest or medicine.  You have worsening pain that goes down into your legs or buttocks.  You have pain that does not get better in one week.  You have pain at night.  You lose weight.  You have a fever or chills. Get help right away if:  You cannot control when you poop (bowel movement) or pee (urinate).  Your arms or legs feel weak.  Your arms or legs lose feeling  (numbness).  You feel sick to your stomach (nauseous) or throw up (vomit).  You have belly (abdominal) pain.  You feel like you may pass out (faint). This information is not intended to replace advice given to you by your health care provider. Make sure you discuss any questions you have with your health care provider. Document Released: 02/02/2008 Document Revised: 01/22/2016 Document Reviewed: 12/18/2013 Elsevier Interactive Patient Education  [employer].

## 2018-03-23 ENCOUNTER — Other Ambulatory Visit: Payer: Self-pay | Admitting: Family Medicine

## 2018-03-23 DIAGNOSIS — M545 Low back pain, unspecified: Secondary | ICD-10-CM

## 2018-03-24 ENCOUNTER — Encounter: Payer: Self-pay | Admitting: Pediatrics

## 2018-03-24 MED ORDER — NAPROXEN 500 MG PO TABS: 500.0000 mg | ORAL_TABLET | Freq: Two times a day (BID) | ORAL | 0 refills | Status: DC

## 2018-03-27 NOTE — Telephone Encounter (Signed)
Dr Evette Doffing this MyChart message I believe is in response to the results note from her urine 03/24/18.

## 2018-03-29 ENCOUNTER — Encounter: Payer: Self-pay | Admitting: Pediatrics

## 2018-04-21 ENCOUNTER — Other Ambulatory Visit: Payer: Self-pay

## 2018-04-21 ENCOUNTER — Other Ambulatory Visit: Payer: Self-pay | Admitting: Pediatrics

## 2018-04-21 DIAGNOSIS — M545 Low back pain, unspecified: Secondary | ICD-10-CM

## 2018-04-21 MED ORDER — NAPROXEN 500 MG PO TABS: 500.0000 mg | ORAL_TABLET | Freq: Two times a day (BID) | ORAL | 0 refills | Status: DC

## 2018-04-21 MED ORDER — BLOOD GLUCOSE MONITOR KIT: PACK | 0 refills | Status: DC

## 2018-04-21 MED ORDER — TRUE METRIX AIR GLUCOSE METER W/DEVICE KIT: 1.0000 | PACK | Freq: Three times a day (TID) | 0 refills | Status: DC

## 2018-04-21 MED ORDER — CYCLOBENZAPRINE HCL 10 MG PO TABS: 10.0000 mg | ORAL_TABLET | Freq: Three times a day (TID) | ORAL | 0 refills | Status: DC | PRN

## 2018-04-25 ENCOUNTER — Ambulatory Visit: Payer: Medicaid Other | Admitting: Nutrition

## 2018-04-26 ENCOUNTER — Ambulatory Visit: Payer: Medicaid Other | Admitting: Family Medicine

## 2018-05-05 ENCOUNTER — Ambulatory Visit: Payer: Medicaid Other | Admitting: Pediatrics

## 2018-05-05 ENCOUNTER — Encounter: Payer: Self-pay | Admitting: Pediatrics

## 2018-05-05 VITALS — BP 136/89 | HR 103 | Temp 98.0°F | Ht 61.5 in | Wt 222.2 lb

## 2018-05-05 DIAGNOSIS — M7062 Trochanteric bursitis, left hip: Secondary | ICD-10-CM

## 2018-05-05 DIAGNOSIS — K649 Unspecified hemorrhoids: Secondary | ICD-10-CM | POA: Diagnosis not present

## 2018-05-05 MED ORDER — HYDROCORTISONE 2.5 % RE CREA: 1.0000 "application " | TOPICAL_CREAM | Freq: Two times a day (BID) | RECTAL | 3 refills | Status: DC

## 2018-05-05 NOTE — Progress Notes (Signed)
  Subjective:   Patient ID: Lisa Norton, female    DOB: [birth year], 47 y.o.   MRN: [id number] CC: Hemorrhoids  HPI: Lisa Norton is a 47 y.o. female   Has had hemorrhoid pain starting soon after recent hysterectomy 02/16/2018.  Was seen on 6/24, given steroid cream, discussed bowel regimen, avoiding straining, constipation, hard bowel movements.  She was using steroid suppositories as well off and on.  Symptoms have continued since then, now feels like she sitting on a small ball all time.  It especially hurts with passing stool, sometimes even with passing gas she has pain.  They often bleed with passing stools.  No red stools, she does have blood when she wipes.  Left lateral hip has been bothering her for the last couple months as well.  Muscle relaxer helps some but makes her very sleepy.  Relevant past medical, surgical, family and social history reviewed. Allergies and medications reviewed and updated. Social History   Tobacco Use  Smoking Status Current Every Day Smoker  . Packs/day: 1.50  . Years: 20.00  . Pack years: 30.00  . Types: Cigarettes  Smokeless Tobacco Former Systems developer  . Types: Snuff  . Quit date: 05/05/1986   ROS: Per HPI   Objective:    BP 136/89   Pulse (!) 103   Temp 98 F (36.7 C) (Oral)   Ht 5' 1.5" (1.562 m)   Wt 222 lb 3.2 oz (100.8 kg)   BMI 41.30 kg/m   Wt Readings from Last 3 Encounters:  05/05/18 222 lb 3.2 oz (100.8 kg)  03/22/18 220 lb (99.8 kg)  03/16/18 223 lb (101.2 kg)    Gen: NAD, alert, cooperative with exam, NCAT EYES:  no conjunctival injection, or no icterus CV: NRRR, normal S1/S2, no murmur, distal pulses 2+ b/l Resp: CTABL, no wheezes, normal WOB Abd: +BS, soft, NTND. Neuro: Alert and oriented, strength equal b/l UE and LE, coordination grossly normal Rectal: Several soft external hemorrhoids present, pain with DRE, normal rectal tone.    Assessment & Plan:  Lisa Norton was seen today for hemorrhoids.  Diagnoses and all orders  for this visit:  Hemorrhoids, unspecified hemorrhoid type Symptoms ongoing for 10 weeks despite conservative treatment.  Will refer for possible procedures.  Continue steroid cream.  Continue bowel regimen.  Trial Linzess given today.  Encouraged daily MiraLAX. -     Ambulatory referral to General Surgery -     hydrocortisone (ANUSOL-HC) 2.5 % rectal cream; Place 1 application rectally 2 (two) times daily.  Trochanteric bursitis of left hip Stretches and exercises given.  Start slow, work-up to doing 2-3 times a day.  Follow up plan: Return in about 4 weeks (around 06/02/2018). Assunta Found, MD [place]

## 2018-05-05 NOTE — Patient Instructions (Signed)
Trochanteric Bursitis Rehab Ask your health care provider which exercises are safe for you. Do exercises exactly as told by your health care provider and adjust them as directed. It is normal to feel mild stretching, pulling, tightness, or discomfort as you do these exercises, but you should stop right away if you feel sudden pain or your pain gets worse.Do not begin these exercises until told by your health care provider. Stretching exercises These exercises warm up your muscles and joints and improve the movement and flexibility of your hip. These exercises also help to relieve pain and stiffness. Exercise A: Iliotibial band stretch  1. Lie on your side with your left / right leg in the top position. 2. Bend your left / right knee and grab your ankle. 3. Slowly bring your knee back so your thigh is behind your body. 4. Slowly lower your knee toward the floor until you feel a gentle stretch on the outside of your left / right thigh. If you do not feel a stretch and your knee will not fall farther, place the heel of your other foot on top of your outer knee and pull your thigh down farther. 5. Hold this position for __________ seconds. 6. Slowly return to the starting position. Repeat __________ times. Complete this exercise __________ times a day. Strengthening exercises These exercises build strength and endurance in your hip and pelvis. Endurance is the ability to use your muscles for a long time, even after they get tired. Exercise B: Bridge ( hip extensors) 1. Lie on your back on a firm surface with your knees bent and your feet flat on the floor. 2. Tighten your buttocks muscles and lift your buttocks off the floor until your trunk is level with your thighs. You should feel the muscles working in your buttocks and the back of your thighs. If this exercise is too easy, try doing it with your arms crossed over your chest. 3. Hold this position for __________ seconds. 4. Slowly return to the  starting position. 5. Let your muscles relax completely between repetitions. Repeat __________ times. Complete this exercise __________ times a day. Exercise C: Squats ( knee extensors and  quadriceps) 1. Stand in front of a table, with your feet and knees pointing straight ahead. You may rest your hands on the table for balance but not for support. 2. Slowly bend your knees and lower your hips like you are going to sit in a chair. ? Keep your weight over your heels, not over your toes. ? Keep your lower legs upright so they are parallel with the table legs. ? Do not let your hips go lower than your knees. ? Do not bend lower than told by your health care provider. ? If your hip pain increases, do not bend as low. 3. Hold this position for __________ seconds. 4. Slowly push with your legs to return to standing. Do not use your hands to pull yourself to standing. Repeat __________ times. Complete this exercise __________ times a day. Exercise D: Hip hike 1. Stand sideways on a bottom step. Stand on your left / right leg with your other foot unsupported next to the step. You can hold onto the railing or wall if needed for balance. 2. Keeping your knees straight and your torso square, lift your left / right hip up toward the ceiling. 3. Hold this position for __________ seconds. 4. Slowly let your left / right hip lower toward the floor, past the starting position. Your foot   should get closer to the floor. Do not lean or bend your knees. Repeat __________ times. Complete this exercise __________ times a day. Exercise E: Single leg stand 1. Stand near a counter or door frame that you can hold onto for balance as needed. It is helpful to stand in front of a mirror for this exercise so you can watch your hip. 2. Squeeze your left / right buttock muscles then lift up your other foot. Do not let your left / right hip push out to the side. 3. Hold this position for __________ seconds. Repeat  __________ times. Complete this exercise __________ times a day. This information is not intended to replace advice given to you by your health care provider. Make sure you discuss any questions you have with your health care provider. Document Released: 09/23/2004 Document Revised: 04/22/2016 Document Reviewed: 08/01/2015 Elsevier Interactive Patient Education  2018 [employer].  Hemorrhoids Hemorrhoids are swollen veins in and around the rectum or anus. Hemorrhoids can cause pain, itching, or bleeding. Most of the time, they do not cause serious problems. They usually get better with diet changes, lifestyle changes, and other home treatments. Follow these instructions at home: Eating and drinking  Eat foods that have fiber, such as whole grains, beans, nuts, fruits, and vegetables. Ask your doctor about taking products that have added fiber (fibersupplements).  Drink enough fluid to keep your pee (urine) clear or pale yellow. For Pain and Swelling  Take a warm-water bath (sitz bath) for 20 minutes to ease pain. Do this 3-4 times a day.  If directed, put ice on the painful area. It may be helpful to use ice between your warm baths. ? Put ice in a plastic bag. ? Place a towel between your skin and the bag. ? Leave the ice on for 20 minutes, 2-3 times a day. General instructions  Take over-the-counter and prescription medicines only as told by your doctor. ? Medicated creams and medicines that are inserted into the anus (suppositories) may be used or applied as told.  Exercise often.  Go to the bathroom when you have the urge to poop (to have a bowel movement). Do not wait.  Avoid pushing too hard (straining) when you poop.  Keep the butt area dry and clean. Use wet toilet paper or moist paper towels.  Do not sit on the toilet for a long time. Contact a doctor if:  You have any of these: ? Pain and swelling that do not get better with treatment or medicine. ? Bleeding that  will not stop. ? Trouble pooping or you cannot poop. ? Pain or swelling outside the area of the hemorrhoids. This information is not intended to replace advice given to you by your health care provider. Make sure you discuss any questions you have with your health care provider. Document Released: 05/25/2008 Document Revised: 01/22/2016 Document Reviewed: 04/30/2015 Elsevier Interactive Patient Education  2018 [employer].  High-Fiber Diet Fiber, also called dietary fiber, is a type of carbohydrate found in fruits, vegetables, whole grains, and beans. A high-fiber diet can have many health benefits. Your health care provider may recommend a high-fiber diet to help:  Prevent constipation. Fiber can make your bowel movements more regular.  Lower your cholesterol.  Relieve hemorrhoids, uncomplicated diverticulosis, or irritable bowel syndrome.  Prevent overeating as part of a weight-loss plan.  Prevent heart disease, type 2 diabetes, and certain cancers.  What is my plan? The recommended daily intake of fiber includes:  38 grams  for men under age 43.  20 grams for men over age 31.  52 grams for women under age 47.  62 grams for women over age 60.  You can get the recommended daily intake of dietary fiber by eating a variety of fruits, vegetables, grains, and beans. Your health care provider may also recommend a fiber supplement if it is not possible to get enough fiber through your diet. What do I need to know about a high-fiber diet?  Fiber supplements have not been widely studied for their effectiveness, so it is better to get fiber through food sources.  Always check the fiber content on thenutrition facts label of any prepackaged food. Look for foods that contain at least 5 grams of fiber per serving.  Ask your dietitian if you have questions about specific foods that are related to your condition, especially if those foods are not listed in the following  section.  Increase your daily fiber consumption gradually. Increasing your intake of dietary fiber too quickly may cause bloating, cramping, or gas.  Drink plenty of water. Water helps you to digest fiber. What foods can I eat? Grains Whole-grain breads. Multigrain cereal. Oats and oatmeal. Brown rice. Barley. Bulgur wheat. Wann. Bran muffins. Popcorn. Rye wafer crackers. Vegetables Sweet potatoes. Spinach. Kale. Artichokes. Cabbage. Broccoli. Green peas. Carrots. Squash. Fruits Berries. Pears. Apples. Oranges. Avocados. Prunes and raisins. Dried figs. Meats and Other Protein Sources Navy, kidney, pinto, and soy beans. Split peas. Lentils. Nuts and seeds. Dairy Fiber-fortified yogurt. Beverages Fiber-fortified soy milk. Fiber-fortified orange juice. Other Fiber bars. The items listed above may not be a complete list of recommended foods or beverages. Contact your dietitian for more options. What foods are not recommended? Grains White bread. Pasta made with refined flour. White rice. Vegetables Fried potatoes. Canned vegetables. Well-cooked vegetables. Fruits Fruit juice. Cooked, strained fruit. Meats and Other Protein Sources Fatty cuts of meat. Fried Sales executive or fried fish. Dairy Milk. Yogurt. Cream cheese. Sour cream. Beverages Soft drinks. Other Cakes and pastries. Butter and oils. The items listed above may not be a complete list of foods and beverages to avoid. Contact your dietitian for more information. What are some tips for including high-fiber foods in my diet?  Eat a wide variety of high-fiber foods.  Make sure that half of all grains consumed each day are whole grains.  Replace breads and cereals made from refined flour or white flour with whole-grain breads and cereals.  Replace white rice with brown rice, bulgur wheat, or millet.  Start the day with a breakfast that is high in fiber, such as a cereal that contains at least 5 grams of fiber per  serving.  Use beans in place of meat in soups, salads, or pasta.  Eat high-fiber snacks, such as berries, raw vegetables, nuts, or popcorn. This information is not intended to replace advice given to you by your health care provider. Make sure you discuss any questions you have with your health care provider. Document Released: 08/16/2005 Document Revised: 01/22/2016 Document Reviewed: 01/29/2014 Elsevier Interactive Patient Education  [employer].

## 2018-05-12 ENCOUNTER — Other Ambulatory Visit: Payer: Self-pay

## 2018-05-12 DIAGNOSIS — E119 Type 2 diabetes mellitus without complications: Secondary | ICD-10-CM

## 2018-05-12 MED ORDER — DAPAGLIFLOZIN PROPANEDIOL 10 MG PO TABS: 10.0000 mg | ORAL_TABLET | Freq: Every day | ORAL | 0 refills | Status: DC

## 2018-05-22 ENCOUNTER — Encounter: Payer: Self-pay | Admitting: Pediatrics

## 2018-05-22 ENCOUNTER — Other Ambulatory Visit: Payer: Self-pay | Admitting: Pediatrics

## 2018-05-22 DIAGNOSIS — M545 Low back pain, unspecified: Secondary | ICD-10-CM

## 2018-05-22 DIAGNOSIS — E119 Type 2 diabetes mellitus without complications: Secondary | ICD-10-CM

## 2018-05-22 MED ORDER — DAPAGLIFLOZIN PROPANEDIOL 10 MG PO TABS: 10.0000 mg | ORAL_TABLET | Freq: Every day | ORAL | 0 refills | Status: DC

## 2018-05-22 MED ORDER — CYCLOBENZAPRINE HCL 10 MG PO TABS: 10.0000 mg | ORAL_TABLET | Freq: Three times a day (TID) | ORAL | 0 refills | Status: DC | PRN

## 2018-05-22 MED ORDER — LIRAGLUTIDE 18 MG/3ML ~~LOC~~ SOPN: 0.6000 mg | PEN_INJECTOR | Freq: Every day | SUBCUTANEOUS | 1 refills | Status: DC

## 2018-05-22 MED ORDER — NAPROXEN 500 MG PO TABS: 500.0000 mg | ORAL_TABLET | Freq: Two times a day (BID) | ORAL | 0 refills | Status: DC

## 2018-05-25 ENCOUNTER — Encounter: Payer: Self-pay | Admitting: Pediatrics

## 2018-05-26 ENCOUNTER — Encounter: Payer: Self-pay | Admitting: Pediatrics

## 2018-05-29 ENCOUNTER — Encounter: Payer: Self-pay | Admitting: Pediatrics

## 2018-05-29 ENCOUNTER — Encounter: Payer: Self-pay | Admitting: Surgery

## 2018-05-30 ENCOUNTER — Encounter: Payer: Self-pay | Admitting: Pediatrics

## 2018-05-31 ENCOUNTER — Other Ambulatory Visit: Payer: Self-pay | Admitting: Family Medicine

## 2018-05-31 MED ORDER — FLUCONAZOLE 150 MG PO TABS: 150.0000 mg | ORAL_TABLET | Freq: Once | ORAL | 0 refills | Status: AC

## 2018-06-01 ENCOUNTER — Ambulatory Visit: Payer: Medicaid Other | Admitting: Nutrition

## 2018-06-01 ENCOUNTER — Encounter: Payer: Self-pay | Admitting: Pediatrics

## 2018-06-01 ENCOUNTER — Ambulatory Visit: Payer: Medicaid Other | Admitting: Pediatrics

## 2018-06-01 VITALS — BP 129/81 | HR 92 | Temp 97.8°F | Ht 61.5 in | Wt 224.0 lb

## 2018-06-01 DIAGNOSIS — K5900 Constipation, unspecified: Secondary | ICD-10-CM

## 2018-06-01 DIAGNOSIS — F339 Major depressive disorder, recurrent, unspecified: Secondary | ICD-10-CM

## 2018-06-01 DIAGNOSIS — E1165 Type 2 diabetes mellitus with hyperglycemia: Secondary | ICD-10-CM | POA: Diagnosis not present

## 2018-06-01 DIAGNOSIS — K219 Gastro-esophageal reflux disease without esophagitis: Secondary | ICD-10-CM

## 2018-06-01 LAB — BAYER DCA HB A1C WAIVED: HB A1C (BAYER DCA - WAIVED): 7.1 % — ABNORMAL HIGH (ref <7.0)

## 2018-06-01 MED ORDER — LIRAGLUTIDE 18 MG/3ML ~~LOC~~ SOPN: 1.2000 mg | PEN_INJECTOR | Freq: Every day | SUBCUTANEOUS | 1 refills | Status: DC

## 2018-06-01 MED ORDER — SERTRALINE HCL 50 MG PO TABS: 50.0000 mg | ORAL_TABLET | Freq: Every day | ORAL | 3 refills | Status: DC

## 2018-06-01 NOTE — Progress Notes (Signed)
  Subjective:   Patient ID: Lisa Norton, female    DOB: 1970-12-19, 47 y.o.   MRN: [id number] CC: Medical Management of Chronic Issues  HPI: Lisa Norton is a 47 y.o. female   Had stomach cramps with linzess 117mcg, sudden watery stools. Taking fiber, milk of magnesia now for softer stools.  Still going sometimes 2 to 3 days between bowel movements, has to push and strain.  Was seen by surgeon, diagnosed with anal fissure.  Started on topical diltiazem.  Diabetes: Avoiding sugary foods.  Taking medicines regularly.  Depression: has noticed worsening symptoms in the last few weeks.  Recently started new job, has been tough adjustment. Not sleeping as well. Mood is down. Not sure if medications she has been on in past have helped much.  Tobacco use: ongoing, off chantix for now  Relevant past medical, surgical, family and social history reviewed. Allergies and medications reviewed and updated.  ROS: Per HPI   Objective:    BP 129/81   Pulse 92   Temp 97.8 F (36.6 C) (Oral)   Ht 5' 1.5" (1.562 m)   Wt 224 lb (101.6 kg)   BMI 41.64 kg/m   Wt Readings from Last 3 Encounters:  06/01/18 224 lb (101.6 kg)  05/05/18 222 lb 3.2 oz (100.8 kg)  03/22/18 220 lb (99.8 kg)    Gen: NAD, alert, cooperative with exam, NCAT EYES:  no conjunctival injection, or no icterus ENT:  TMs pearly gray b/l, OP without erythema LYMPH: no cervical LAD CV: NRRR, normal S1/S2, no murmur, distal pulses 2+ b/l Resp: CTABL, no wheezes, normal WOB Abd: +BS, soft, NTND. no guarding or organomegaly Ext: No edema, warm Neuro: Alert and oriented MSK: normal muscle bulk  Assessment & Plan:  Lisa Norton was seen today for medical management of chronic issues.  Diagnoses and all orders for this visit:  Type 2 diabetes mellitus with hyperglycemia, unspecified whether long term insulin use (HCC) A1c 7.1, improved from 7.9.  Continue current medicines, lifestyle changes. -     Bayer DCA Hb A1c Waived -      Basic Metabolic Panel -     liraglutide (VICTOZA) 18 MG/3ML SOPN; Inject 0.2 mLs (1.2 mg total) into the skin daily. Titrate as directed  Depression, recurrent ([place]) More symptoms, start below.  Take half tab for 8 days, then take full tab.  Referral to virtual behavioral health. -     sertraline (ZOLOFT) 50 MG tablet; Take 1 tablet (50 mg total) by mouth daily.  Gastroesophageal reflux disease, esophagitis presence not specified Continue PPI, pantoprazole  Constipation, unspecified constipation type Trial 72 mcg Linzess take in the morning with food, stay near bathroom first few days of taking it.  Follow up plan: Return in about 3 months (around 09/01/2018). Assunta Found, MD [place]

## 2018-06-01 NOTE — Patient Instructions (Addendum)
[facility] Contact: [phone]  Take half tab of sertraline for 8 days, then take full tab.

## 2018-06-02 ENCOUNTER — Telehealth: Payer: Self-pay

## 2018-06-02 LAB — BASIC METABOLIC PANEL
BUN/Creatinine Ratio: 18 (ref 9–23)
BUN: 14 mg/dL (ref 6–24)
CO2: 21 mmol/L (ref 20–29)
CREATININE: 0.8 mg/dL (ref 0.57–1.00)
Calcium: 9.1 mg/dL (ref 8.7–10.2)
Chloride: 104 mmol/L (ref 96–106)
GFR calc Af Amer: 103 mL/min/{1.73_m2} (ref >59)
GFR, EST NON AFRICAN AMERICAN: 89 mL/min/{1.73_m2} (ref >59)
GLUCOSE: 144 mg/dL — AB (ref 65–99)
Potassium: 3.8 mmol/L (ref 3.5–5.2)
SODIUM: 140 mmol/L (ref 134–144)

## 2018-06-02 NOTE — Telephone Encounter (Signed)
[facility] - Left Msg 

## 2018-06-05 ENCOUNTER — Telehealth: Payer: Self-pay

## 2018-06-05 NOTE — Telephone Encounter (Signed)
[facility] - Left Message  

## 2018-06-08 NOTE — Telephone Encounter (Signed)
A user error has taken place: encounter opened in error, closed for administrative reasons.

## 2018-06-13 ENCOUNTER — Telehealth: Payer: Self-pay

## 2018-06-13 NOTE — Telephone Encounter (Signed)
Several attempts have been made to contact patient without success. Patient will be placed on the inactive list.  If services are needed again.  Please contact [facility] at [phone].    Information will be routed to the PCP and Dr. Hisada  

## 2018-06-21 ENCOUNTER — Ambulatory Visit: Payer: Medicaid Other | Admitting: Pediatrics

## 2018-07-05 ENCOUNTER — Encounter: Payer: Self-pay | Admitting: Physician Assistant

## 2018-07-05 ENCOUNTER — Ambulatory Visit: Payer: Medicaid Other | Admitting: Physician Assistant

## 2018-07-05 ENCOUNTER — Ambulatory Visit (INDEPENDENT_AMBULATORY_CARE_PROVIDER_SITE_OTHER): Payer: Medicaid Other

## 2018-07-05 VITALS — BP 101/65 | HR 88 | Temp 97.2°F | Ht 61.5 in | Wt 224.6 lb

## 2018-07-05 DIAGNOSIS — M5442 Lumbago with sciatica, left side: Secondary | ICD-10-CM | POA: Diagnosis not present

## 2018-07-05 DIAGNOSIS — M545 Low back pain, unspecified: Secondary | ICD-10-CM

## 2018-07-05 MED ORDER — CYCLOBENZAPRINE HCL 10 MG PO TABS: 10.0000 mg | ORAL_TABLET | Freq: Three times a day (TID) | ORAL | 0 refills | Status: DC | PRN

## 2018-07-05 MED ORDER — DICLOFENAC SODIUM 75 MG PO TBEC: 75.0000 mg | DELAYED_RELEASE_TABLET | Freq: Two times a day (BID) | ORAL | 0 refills | Status: DC

## 2018-07-06 ENCOUNTER — Encounter: Payer: Self-pay | Admitting: Pediatrics

## 2018-07-06 DIAGNOSIS — M5442 Lumbago with sciatica, left side: Secondary | ICD-10-CM | POA: Insufficient documentation

## 2018-07-06 MED ORDER — VARENICLINE TARTRATE 1 MG PO TABS: 1.0000 mg | ORAL_TABLET | Freq: Two times a day (BID) | ORAL | 1 refills | Status: DC

## 2018-07-06 MED ORDER — VARENICLINE TARTRATE 0.5 MG X 11 & 1 MG X 42 PO MISC: ORAL | 0 refills | Status: DC

## 2018-07-06 NOTE — Progress Notes (Signed)
BP 101/65   Pulse 88   Temp (!) 97.2 F (36.2 C) (Oral)   Ht 5' 1.5" (1.562 m)   Wt 224 lb 9.6 oz (101.9 kg)   BMI 41.75 kg/m     Subjective:    Patient ID: Lisa Norton, female    DOB: 08-22-1971, 47 y.o.   MRN: [id number]  HPI: Lisa Norton is a 47 y.o. female presenting on 07/05/2018 for left hip,lower back pain, burning in leg  With this patient comes in with severe left lower back pain is burning in the area and down her leg.  She even has pain all the way to her toes at times.  She does work a job where she stands in [employer].  She does have some known degenerative joint.  She has never had surgery.  She was trying to get down from a slightly elevated area and when she jumped down she did think that she hurt a little bit but it was the next day when she hurt significantly more.  Was after she got up the next morning.  She denies any signs of infection.  Past Medical History:  Diagnosis Date  . Anxiety   . COLD SORE 05/15/2010  . DEPRESSION 12/12/2009  . Diabetes mellitus without complication ([place])   . High cholesterol   . 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 HYPERTENSION 12/12/2009  . TOBACCO ABUSE 12/12/2009   Relevant past medical, surgical, family and social history reviewed and updated as indicated. Interim medical history since our last visit reviewed. Allergies and medications reviewed and updated. DATA REVIEWED: CHART IN EPIC  Family History reviewed for pertinent findings.  Review of Systems  Constitutional: Negative.   HENT: Negative.   Eyes: Negative.   Respiratory: Negative.   Gastrointestinal: Negative.   Genitourinary: Negative.   Musculoskeletal: Positive for back pain, gait problem and myalgias.    Allergies as of 07/05/2018      Reactions   Metformin And Related    diarrhea      Medication List        Accurate as of 07/05/18 11:59 PM. Always use your most recent med list.          blood glucose meter kit and supplies Kit Dispense based  on patient and insurance preference. Use up to four times daily as directed. (FOR ICD-9 250.00, 250.01).   cyclobenzaprine 10 MG tablet Commonly known as:  FLEXERIL Take 1 tablet (10 mg total) by mouth 3 (three) times daily as needed for muscle spasms.   dapagliflozin propanediol 10 MG Tabs tablet Commonly known as:  FARXIGA Take 10 mg by mouth daily.   diclofenac 75 MG EC tablet Commonly known as:  VOLTAREN Take 1 tablet (75 mg total) by mouth 2 (two) times daily.   gabapentin 100 MG capsule Commonly known as:  NEURONTIN TAKE ONE TABLET BY MOUTH 3 TIMES DAILY   hydrocortisone 2.5 % rectal cream Commonly known as:  ANUSOL-HC Place 1 application rectally 2 (two) times daily.   liraglutide 18 MG/3ML Sopn Commonly known as:  VICTOZA Inject 0.2 mLs (1.2 mg total) into the skin daily. Titrate as directed   mupirocin ointment 2 % Commonly known as:  BACTROBAN Use twice a day on affected areas   naproxen 500 MG tablet Commonly known as:  NAPROSYN Take 1 tablet (500 mg total) by mouth 2 (two) times daily with a meal.   pantoprazole 40 MG tablet Commonly known as:  PROTONIX TAKE 1  TABLET BY MOUTH ONCE DAILY IN THE MORNING   polyethylene glycol powder powder Commonly known as:  GLYCOLAX/MIRALAX Take 17 g by mouth 2 (two) times daily as needed.   pravastatin 80 MG tablet Commonly known as:  PRAVACHOL Take 1 tablet (80 mg total) by mouth daily.   ranitidine 150 MG tablet Commonly known as:  ZANTAC Take 1 tablet (150 mg total) by mouth daily.   sertraline 50 MG tablet Commonly known as:  ZOLOFT Take 1 tablet (50 mg total) by mouth daily.   TRUE METRIX AIR GLUCOSE METER w/Device Kit 1 kit by Does not apply route 3 (three) times daily.          Objective:    BP 101/65   Pulse 88   Temp (!) 97.2 F (36.2 C) (Oral)   Ht 5' 1.5" (1.562 m)   Wt 224 lb 9.6 oz (101.9 kg)   BMI 41.75 kg/m    Allergies  Allergen Reactions  . Metformin And Related     diarrhea     Wt Readings from Last 3 Encounters:  07/05/18 224 lb 9.6 oz (101.9 kg)  06/01/18 224 lb (101.6 kg)  05/05/18 222 lb 3.2 oz (100.8 kg)    Physical Exam  Constitutional: She is oriented to person, place, and time. She appears well-developed and well-nourished.  HENT:  Head: Normocephalic and atraumatic.  Eyes: Pupils are equal, round, and reactive to light. Conjunctivae and EOM are normal.  Cardiovascular: Normal rate, regular rhythm, normal heart sounds and intact distal pulses.  Pulmonary/Chest: Effort normal and breath sounds normal.  Abdominal: Soft. Bowel sounds are normal.  Musculoskeletal:       Lumbar back: She exhibits decreased range of motion, tenderness, pain and spasm.       Back:  Neurological: She is alert and oriented to person, place, and time. She has normal reflexes.  Skin: Skin is warm and dry. No rash noted.  Psychiatric: She has a normal mood and affect. Her behavior is normal. Judgment and thought content normal.    Results for orders placed or performed in visit on 06/01/18  Bayer DCA Hb A1c Waived  Result Value Ref Range   HB A1C (BAYER DCA - WAIVED) 7.1 (H) <9.3 %  Basic Metabolic Panel  Result Value Ref Range   Glucose 144 (H) 65 - 99 mg/dL   BUN 14 6 - 24 mg/dL   Creatinine, Ser 0.80 0.57 - 1.00 mg/dL   GFR calc non Af Amer 89 >59 mL/min/1.73   GFR calc Af Amer 103 >59 mL/min/1.73   BUN/Creatinine Ratio 18 9 - 23   Sodium 140 134 - 144 mmol/L   Potassium 3.8 3.5 - 5.2 mmol/L   Chloride 104 96 - 106 mmol/L   CO2 21 20 - 29 mmol/L   Calcium 9.1 8.7 - 10.2 mg/dL      Assessment & Plan:   1. Acute left-sided low back pain with left-sided sciatica - diclofenac (VOLTAREN) 75 MG EC tablet; Take 1 tablet (75 mg total) by mouth 2 (two) times daily.  Dispense: 60 tablet; Refill: 0 - cyclobenzaprine (FLEXERIL) 10 MG tablet; Take 1 tablet (10 mg total) by mouth 3 (three) times daily as needed for muscle spasms.  Dispense: 30 tablet; Refill: 0 - DG  Lumbar Spine 2-3 Views; Future    Continue all other maintenance medications as listed above.  Follow up plan: Return in about 2 weeks (around 07/19/2018) for [facility] recheck.  Educational handout given for survey  Terald Sleeper PA-C [facility] [address]  [place], [place] [address] [phone]   07/06/2018, 1:37 PM

## 2018-07-07 ENCOUNTER — Ambulatory Visit: Payer: Medicaid Other | Admitting: Family Medicine

## 2018-07-11 ENCOUNTER — Encounter: Payer: Self-pay | Admitting: Pediatrics

## 2018-07-12 NOTE — Telephone Encounter (Signed)
Can come back in sooner if needed

## 2018-07-12 NOTE — Telephone Encounter (Signed)
Follow up appointment for continued back pain scheduled with pcp.

## 2018-07-13 ENCOUNTER — Encounter: Payer: Self-pay | Admitting: Pediatrics

## 2018-07-13 ENCOUNTER — Ambulatory Visit: Payer: Medicaid Other | Admitting: Pediatrics

## 2018-07-13 VITALS — BP 135/83 | HR 86 | Temp 97.8°F | Ht 61.5 in | Wt 223.0 lb

## 2018-07-13 DIAGNOSIS — M5410 Radiculopathy, site unspecified: Secondary | ICD-10-CM | POA: Diagnosis not present

## 2018-07-13 DIAGNOSIS — M545 Low back pain, unspecified: Secondary | ICD-10-CM

## 2018-07-13 DIAGNOSIS — B001 Herpesviral vesicular dermatitis: Secondary | ICD-10-CM

## 2018-07-13 DIAGNOSIS — M5442 Lumbago with sciatica, left side: Secondary | ICD-10-CM

## 2018-07-13 MED ORDER — VALACYCLOVIR HCL 1 G PO TABS: ORAL_TABLET | ORAL | 0 refills | Status: DC

## 2018-07-13 MED ORDER — NAPROXEN 500 MG PO TABS: 500.0000 mg | ORAL_TABLET | Freq: Two times a day (BID) | ORAL | 1 refills | Status: DC

## 2018-07-13 MED ORDER — CYCLOBENZAPRINE HCL 10 MG PO TABS: 10.0000 mg | ORAL_TABLET | Freq: Three times a day (TID) | ORAL | 1 refills | Status: DC | PRN

## 2018-07-13 MED ORDER — PREDNISONE 10 MG PO TABS: 10.0000 mg | ORAL_TABLET | Freq: Every day | ORAL | 0 refills | Status: DC

## 2018-07-13 NOTE — Progress Notes (Signed)
Subjective:   Patient ID: Lisa Norton, female    DOB: 09-Oct-1970, 47 y.o.   MRN: [id number] CC: Back Pain and Hip Pain (left)  HPI: Lisa Norton is a 47 y.o. female   Seen on 11/6 for acute back pain.  5 days before that visit she had jumped down fairly large step, landing on her feet.  She did not know she injured anything at the time.  The next morning she woke up with this back pain.  Hurting especially on the left side.  Does go all the way across her back.  Front side of her left upper leg with burning sensation and tingly.  Pain does come down around her left hip as well.  Standing, sitting, walking hurts.  Moving legs too much also increased back pain.  No weakness in either 1 of her legs, no trouble emptying bladder or bowels, no incontinence.  No numbness in her legs.  She was started on muscle relaxer and diclofenac 1 week ago at last visit.  Has not been helping much.  The muscle relaxer does make her sleepy.  She has not had back pain quite like this back pain in the past.  Relevant past medical, surgical, family and social history reviewed. Allergies and medications reviewed and updated. Social History   Tobacco Use  Smoking Status Current Every Day Smoker  . Packs/day: 1.50  . Years: 20.00  . Pack years: 30.00  . Types: Cigarettes  Smokeless Tobacco Former Systems developer  . Types: Snuff  . Quit date: 05/05/1986   ROS: Per HPI   Objective:    BP 135/83   Pulse 86   Temp 97.8 F (36.6 C) (Oral)   Ht 5' 1.5" (1.562 m)   Wt 223 lb (101.2 kg)   BMI 41.45 kg/m   Wt Readings from Last 3 Encounters:  07/13/18 223 lb (101.2 kg)  07/05/18 224 lb 9.6 oz (101.9 kg)  06/01/18 224 lb (101.6 kg)    Gen: NAD, alert, cooperative with exam, NCAT EYES: EOMI, no conjunctival injection, or no icterus CV: NRRR, normal S1/S2, no murmur, distal pulses 2+ b/l Resp: CTABL, no wheezes, normal WOB Abd: +BS, soft, NTND. Ext: No edema, warm Neuro: Alert and oriented, strength equal b/l  lower extremities.  Sensation intact lower extremities.  Patellar reflex 2+ bilaterally. MSK: No point tenderness over spine.  Tender palpation left lower back paraspinal muscles.  Pain in lower back with flexion and extension at knee bilaterally, limiting strength exam.  Assessment & Plan:  Agam was seen today for back pain and hip pain.  Diagnoses and all orders for this visit:  Radiculopathy, unspecified spinal region + Straight leg raise, ongoing pain despite NSAIDs, rest, muscle relaxer.  Will do trial of prednisone.  Will increase blood sugars.  Patient recently stopped Levemir.  Blood sugars in the morning persistently greater than 200, start 10 units Levemir the next evening.  Continue until morning sugars less than 150.  Return precautions discussed. -     predniSONE (DELTASONE) 10 MG tablet; Take 1 tablet (10 mg total) by mouth daily with breakfast. Take 4 tabs x 3 days, then 3, 2,  1 tab each for 3 days  Acute left-sided low back pain with left-sided sciatica -     cyclobenzaprine (FLEXERIL) 10 MG tablet; Take 1 tablet (10 mg total) by mouth 3 (three) times daily as needed for muscle spasms.  Acute left-sided low back pain without sciatica -     naproxen (  NAPROSYN) 500 MG tablet; Take 1 tablet (500 mg total) by mouth 2 (two) times daily with a meal.  Cold sore -     valACYclovir (VALTREX) 1000 MG tablet; Take 2 tab twice daily 2 doses at the start of symptoms.   Follow up plan: Return in about 18 days (around 07/31/2018). Assunta Found, MD [place]

## 2018-07-21 ENCOUNTER — Other Ambulatory Visit: Payer: Self-pay | Admitting: Pediatrics

## 2018-07-21 DIAGNOSIS — E119 Type 2 diabetes mellitus without complications: Secondary | ICD-10-CM

## 2018-07-21 MED ORDER — DAPAGLIFLOZIN PROPANEDIOL 10 MG PO TABS: 10.0000 mg | ORAL_TABLET | Freq: Every day | ORAL | 0 refills | Status: DC

## 2018-07-22 ENCOUNTER — Encounter: Payer: Self-pay | Admitting: Pediatrics

## 2018-07-26 ENCOUNTER — Ambulatory Visit: Payer: Medicaid Other | Admitting: Nutrition

## 2018-07-28 ENCOUNTER — Ambulatory Visit (INDEPENDENT_AMBULATORY_CARE_PROVIDER_SITE_OTHER): Payer: Medicaid Other

## 2018-07-28 DIAGNOSIS — Z23 Encounter for immunization: Secondary | ICD-10-CM

## 2018-08-07 ENCOUNTER — Encounter: Payer: Self-pay | Admitting: Pediatrics

## 2018-08-07 ENCOUNTER — Ambulatory Visit: Payer: Medicaid Other | Admitting: Pediatrics

## 2018-08-07 VITALS — BP 153/92 | HR 80 | Temp 97.6°F | Ht 61.5 in | Wt 225.0 lb

## 2018-08-07 DIAGNOSIS — R030 Elevated blood-pressure reading, without diagnosis of hypertension: Secondary | ICD-10-CM | POA: Diagnosis not present

## 2018-08-07 DIAGNOSIS — M25552 Pain in left hip: Secondary | ICD-10-CM

## 2018-08-07 DIAGNOSIS — M5442 Lumbago with sciatica, left side: Secondary | ICD-10-CM | POA: Diagnosis not present

## 2018-08-07 DIAGNOSIS — M79604 Pain in right leg: Secondary | ICD-10-CM | POA: Diagnosis not present

## 2018-08-07 DIAGNOSIS — M79605 Pain in left leg: Secondary | ICD-10-CM

## 2018-08-07 MED ORDER — CYCLOBENZAPRINE HCL 10 MG PO TABS: 10.0000 mg | ORAL_TABLET | Freq: Three times a day (TID) | ORAL | 3 refills | Status: DC | PRN

## 2018-08-07 NOTE — Progress Notes (Signed)
  Subjective:   Patient ID: Lisa Norton, female    DOB: May 10, 1971, 47 y.o.   MRN: [id number] CC: Back Pain and Hip Pain  HPI: Lisa Norton is a 47 y.o. female   Continues to be bothered by low back pain.  Had an injury 4 weeks ago.  Hurts in the middle of her lower back, also across her lower back.  Having pain shooting down her left leg at times.  Anterior left leg feels numb and tingly, lasts until she lies down.  Left hip pain: Tried trochanteric bursitis stretches.  Continues to be bothered by pain in the side of her left hip.  Side feels swollen compared to the other side.  Elevated blood pressure: Checking at home at times.  Systolics often into the 409W.  Last night blood pressure was 107/78.  Aortic atherosclerosis: Taking statin regularly.  Concerned about what this means for future.  She does have leg fatigue after walking up hills.  Relevant past medical, surgical, family and social history reviewed. Allergies and medications reviewed and updated. Social History   Tobacco Use  Smoking Status Current Every Day Smoker  . Packs/day: 1.50  . Years: 20.00  . Pack years: 30.00  . Types: Cigarettes  Smokeless Tobacco Former Systems developer  . Types: Snuff  . Quit date: 05/05/1986   ROS: Per HPI   Objective:    BP (!) 153/92   Pulse 80   Temp 97.6 F (36.4 C) (Oral)   Ht 5' 1.5" (1.562 m)   Wt 225 lb (102.1 kg)   BMI 41.82 kg/m   Wt Readings from Last 3 Encounters:  08/07/18 225 lb (102.1 kg)  07/13/18 223 lb (101.2 kg)  07/05/18 224 lb 9.6 oz (101.9 kg)    Gen: NAD, alert, cooperative with exam, NCAT EYES:  no conjunctival injection, or no icterus CV: NRRR, normal S1/S2, no murmur, distal pulses 2+ b/l Resp: CTABL, no wheezes, normal WOB Abd: +BS, soft, NTND. no guarding or organomegaly Ext: No edema, warm Neuro: Alert and oriented, strength equal b/l UE and LE, coordination grossly normal, + SLR, patellar reflex 1+ bilaterally MSK: ttp over left greater trochanter.   Pain reproduced with internal and external rotation of the hip.  Assessment & Plan:  Lisa Norton was seen today for back pain and hip pain.  Diagnoses and all orders for this visit:  Acute left-sided low back pain with left-sided sciatica Flexeril helps some at night, otherwise having a constant dull ache in her back, sometimes worse, sometimes legs feel like they are getting out from under her.  Recent acute injury. -     cyclobenzaprine (FLEXERIL) 10 MG tablet; Take 1 tablet (10 mg total) by mouth 3 (three) times daily as needed for muscle spasms. -     Ambulatory referral to Orthopedic Surgery  Elevated blood pressure reading Blood pressures better not currently on any medication.  Check blood pressures at home, follow-up with me as scheduled in 4 weeks.  Let me know if persistently greater than 135 on top.  Left hip pain Suspect greater trochanter bursitis.  Did not improve with conservative treatment.  Will refer to orthopedics  Pain in both lower extremities In office ABIs normal bilaterally. -     POCT ABI Screening Pilot No Charge   Follow up plan: Return in about 4 weeks (around 09/04/2018) for Chronic follow-up. Assunta Found, MD [place]

## 2018-08-07 NOTE — Patient Instructions (Signed)
Check blood pressures at home, bring to next appointment

## 2018-08-14 ENCOUNTER — Encounter: Payer: Self-pay | Admitting: Pediatrics

## 2018-08-17 ENCOUNTER — Encounter (INDEPENDENT_AMBULATORY_CARE_PROVIDER_SITE_OTHER): Payer: Self-pay | Admitting: Orthopaedic Surgery

## 2018-08-17 ENCOUNTER — Ambulatory Visit (INDEPENDENT_AMBULATORY_CARE_PROVIDER_SITE_OTHER): Payer: Medicaid Other | Admitting: Orthopaedic Surgery

## 2018-08-17 VITALS — BP 124/69 | HR 85 | Ht 61.0 in | Wt 223.0 lb

## 2018-08-17 DIAGNOSIS — M7062 Trochanteric bursitis, left hip: Secondary | ICD-10-CM | POA: Diagnosis not present

## 2018-08-17 DIAGNOSIS — M5136 Other intervertebral disc degeneration, lumbar region: Secondary | ICD-10-CM | POA: Diagnosis not present

## 2018-08-17 NOTE — Progress Notes (Signed)
Office Visit Note   Patient: Lisa Norton           Date of Birth: [birth year]           MRN: [id number] Visit Date: 08/17/2018              Requested by: Eustaquio Maize, MD [place], [place] [address] PCP: Eustaquio Maize, MD   Assessment & Plan: Visit Diagnoses:  1. Trochanteric bursitis, left hip   2. Other intervertebral disc degeneration, lumbar region     Plan: Left trochanteric injection performed with some relief.  We discussed using some ice, activity modification.  She will watch her sugars carefully and adjust her insulin up to cover any possible hyperglycemia that may occur in the first week.  She can return if she has persistent symptoms.  We discussed with her that this is likely related to her lumbar disc degeneration with secondary trochanteric bursitis.  Hip x-rays were deferred today since she had good range of motion of her hip without pain.  Thank you the opportunity to see her in consultation.  Follow-Up Instructions: Return in about 4 weeks (around 09/14/2018).   Orders:  Orders Placed This Encounter  Procedures  . Large Joint Inj: L greater trochanter   No orders of the defined types were placed in this encounter.     Procedures: Large Joint Inj: L greater trochanter on 08/17/2018 3:17 PM Details: 22 G 3.5 in needle, lateral approach Medications: 0.5 mL lidocaine 1 %; 2 mL bupivacaine 0.25 %; 40 mg methylPREDNISolone acetate 40 MG/ML      Clinical Data: No additional findings.   Subjective: Chief Complaint  Patient presents with  . Lower Back - Pain    HPI orthopedic consultation requested by Dr. Assunta Found in a 47 year old female with low back pain left buttocks and left thigh pain.  She states she has had some numbness pins-and-needles in her thigh and is been present for 6 months.  She has been through exercises without relief and also taking Flexeril.  She does have diabetes type 2 and has been on Victoza.  Patient states  she is allergic to metformin.  He denies associated bowel or bladder symptoms.  Patient is standing job in [employer].  She hopped off higher step and shortly after started having increased left lateral hip pain.  Lumbar x-rays were negative for compression fracture but she had disc degeneration noted at L3-4 and L4-5.  Review of Systems previous hysterectomy June 2019.  Right foot surgery January.  Gallbladder surgery 2006.  Cervical fusion August 2015 doing well.  Patient is a smoker.  Positive for acid reflux anxiety depression, diabetes, hypertension and migraines.  Hypercholesterolemia.  14 point review of systems otherwise negative.   Objective: Vital Signs: BP 124/69   Pulse 85   Ht 5\' 1"  (1.549 m)   Wt 223 lb (101.2 kg)   BMI 42.14 kg/m   Physical Exam Constitutional:      Appearance: She is well-developed.  HENT:     Head: Normocephalic.     Comments: Healed anterior cervical incision negative Spurling.    Right Ear: External ear normal.     Left Ear: External ear normal.  Eyes:     Pupils: Pupils are equal, round, and reactive to light.  Neck:     Thyroid: No thyromegaly.     Trachea: No tracheal deviation.  Cardiovascular:     Rate and Rhythm: Normal rate.  Pulmonary:  Effort: Pulmonary effort is normal.  Abdominal:     Palpations: Abdomen is soft.  Skin:    General: Skin is warm and dry.  Neurological:     Mental Status: She is alert and oriented to person, place, and time.  Psychiatric:        Behavior: Behavior normal.     Ortho Exam negative straight leg raising.  Morbid obesity BMI 42.  Mild sciatic notch tenderness negative straight leg raising 90 degrees on the left and right.  Knee and ankle jerk are intact and symmetrical distal pulses palpable.  Exquisite tenderness over the left greater trochanter and she is amatory with left hip limp.  Minimal discomfort with internal rotation external rotation of her hip.  Opposite right trochanter is  nontender.  Specialty Comments:  No specialty comments available.  Imaging: CLINICAL DATA:  Low back pain after jumping injury last week.  EXAM: LUMBAR SPINE - 2-3 VIEW  COMPARISON:  None.  FINDINGS: No fracture or spondylolisthesis is noted. Mild degenerative disc disease is noted at L3-4 and L4-5. Atherosclerosis of abdominal aorta is noted.  IMPRESSION: Mild multilevel degenerative disc disease. No acute abnormality seen in the lumbar spine.   Electronically Signed   By: Marijo Conception, M.D.   On: 07/05/2018 16:43   PMFS History: Patient Active Problem List   Diagnosis Date Noted  . Acute left-sided low back pain with left-sided sciatica 07/06/2018  . Rectocele 01/05/2018  . Uterovaginal prolapse 01/05/2018  . Urinary incontinence, mixed 12/16/2017  . S/P cesarean section 02/12/2015  . Fetal demise, greater than 22 weeks, antepartum   . 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 Obesity 08/13/2014  . History of hypertension 08/12/2014  . C7 radiculopathy 02/11/2014  . Dyslipidemia 01/04/2011  . Type 2 diabetes mellitus ([place]) 01/04/2011  . TOBACCO ABUSE 12/12/2009  . DEPRESSION 12/12/2009  . Essential hypertension 12/12/2009   Past Medical History:  Diagnosis Date  . Anxiety   . COLD SORE 05/15/2010  . DEPRESSION 12/12/2009  . Diabetes mellitus without complication ([place])   . High cholesterol   . 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 HYPERTENSION 12/12/2009  . TOBACCO ABUSE 12/12/2009    Family History  Problem Relation Age of Onset  . Heart disease Mother   . Narcolepsy Mother     Past Surgical History:  Procedure Laterality Date  . BLADDER SUSPENSION    . CERVICAL FUSION    . CESAREAN SECTION WITH BILATERAL TUBAL LIGATION Bilateral 02/12/2015   Procedure: CESAREAN SECTION;  Surgeon: Florian Buff, MD;  Location: [facility];  Service: Obstetrics;  Laterality: Bilateral;  Fetal Demise  . CHOLECYSTECTOMY  2006  . FOOT SURGERY Right    Plantar, bone spurs  .  LAPAROSCOPIC VAGINAL HYSTERECTOMY WITH SALPINGECTOMY  02/16/2018   [facility]  . TONSILLECTOMY  1988   Social History   Occupational History  . Not on file  Tobacco Use  . Smoking status: Current Every Day Smoker    Packs/day: 1.50    Years: 20.00    Pack years: 30.00    Types: Cigarettes  . Smokeless tobacco: Former Systems developer    Types: Snuff    Quit date: 05/05/1986  Substance and Sexual Activity  . Alcohol use: No  . Drug use: No  . Sexual activity: Not Currently    Birth control/protection: None    Comment: pt states she did not have a tubal

## 2018-08-18 ENCOUNTER — Encounter (INDEPENDENT_AMBULATORY_CARE_PROVIDER_SITE_OTHER): Payer: Self-pay | Admitting: Orthopaedic Surgery

## 2018-08-18 MED ORDER — METHYLPREDNISOLONE ACETATE 40 MG/ML IJ SUSP
40.0000 mg | INTRAMUSCULAR | Status: AC | PRN
  Administered 2018-08-17: 40 mg via INTRA_ARTICULAR

## 2018-08-18 MED ORDER — LIDOCAINE HCL 1 % IJ SOLN
0.5000 mL | INTRAMUSCULAR | Status: AC | PRN
  Administered 2018-08-17: .5 mL

## 2018-08-18 MED ORDER — BUPIVACAINE HCL 0.25 % IJ SOLN
2.0000 mL | INTRAMUSCULAR | Status: AC | PRN
  Administered 2018-08-17: 2 mL via INTRA_ARTICULAR

## 2018-09-06 ENCOUNTER — Ambulatory Visit: Payer: Medicaid Other | Admitting: Pediatrics

## 2018-09-06 ENCOUNTER — Encounter: Payer: Self-pay | Admitting: Pediatrics

## 2018-09-06 VITALS — BP 124/77 | HR 72 | Temp 97.0°F | Ht 61.0 in | Wt 224.0 lb

## 2018-09-06 DIAGNOSIS — Z720 Tobacco use: Secondary | ICD-10-CM | POA: Diagnosis not present

## 2018-09-06 DIAGNOSIS — M25552 Pain in left hip: Secondary | ICD-10-CM | POA: Diagnosis not present

## 2018-09-06 DIAGNOSIS — F339 Major depressive disorder, recurrent, unspecified: Secondary | ICD-10-CM

## 2018-09-06 DIAGNOSIS — E1165 Type 2 diabetes mellitus with hyperglycemia: Secondary | ICD-10-CM

## 2018-09-06 LAB — BAYER DCA HB A1C WAIVED: HB A1C (BAYER DCA - WAIVED): 8.1 % — ABNORMAL HIGH (ref <7.0)

## 2018-09-06 MED ORDER — INSULIN DETEMIR 100 UNIT/ML FLEXPEN: 20.0000 [IU] | Freq: Every day | SUBCUTANEOUS | Status: DC

## 2018-09-06 NOTE — Patient Instructions (Signed)
[facility]  [address] [place], [place] [address] (ph) [phone]  [address] [place],  [address] (ph) [phone]  [place]: [phone] [place]

## 2018-09-06 NOTE — Progress Notes (Signed)
  Subjective:   Patient ID: Lisa Norton, female    DOB: 12/07/1970, 48 y.o.   MRN: [id number] CC: Medical Management of Chronic Issues  HPI: Lisa Norton is a 48 y.o. female   Hip pain improved some day of steroid injection with orthopedics.  Still having left-sided pain now.  Has follow-up appointment in a couple weeks.  Diabetes: Blood sugars in the morning have been over 300s at times.  Taking medicines regularly.  Depressed mood: Planning to get started in grief counseling.  Taking sertraline regularly.  She does think that it is been helping enough.  Tobacco use: Ongoing, has been working at cutting back.  Stress at home has made it hard.  Has not yet started Chantix, planning to in the next few months when she is ready.  Relevant past medical, surgical, family and social history reviewed. Allergies and medications reviewed and updated. Social History   Tobacco Use  Smoking Status Current Every Day Smoker  . Packs/day: 1.50  . Years: 20.00  . Pack years: 30.00  . Types: Cigarettes  Smokeless Tobacco Former Systems developer  . Types: Snuff  . Quit date: 05/05/1986   ROS: Per HPI   Objective:    BP 124/77   Pulse 72   Temp (!) 97 F (36.1 C) (Oral)   Ht 5\' 1"  (1.549 m)   Wt 224 lb (101.6 kg)   BMI 42.32 kg/m   Wt Readings from Last 3 Encounters:  09/06/18 224 lb (101.6 kg)  08/17/18 223 lb (101.2 kg)  08/07/18 225 lb (102.1 kg)   Gen: NAD, alert, cooperative with exam, NCAT EYES:no conjunctival injection, or no icterus ENT: OP without erythema LYMPH: no cervical LAD CV: NRRR, normal S1/S2, no murmur, distal pulses 2+ b/l Resp: CTABL, no wheezes, normal WOB Abd: +BS, soft, NTND.  Ext: No edema, warm Neuro: Alert and oriented MSK: normal muscle bulk  Assessment & Plan:  Lisa Norton was seen today for medical management of chronic issues.  Diagnoses and all orders for this visit:  Type 2 diabetes mellitus with hyperglycemia, unspecified whether long term insulin use  (HCC) A1c up to 8.1.  Restart Levemir.  Let me know if blood sugars in the morning are less than 100 or greater than 250. -     Bayer DCA Hb A1c Waived -     insulin detemir (LEVEMIR) 100 unit/ml SOLN; Inject 0.2 mLs (20 Units total) into the skin at bedtime.  Tobacco use Continue to encourage cessation, planning to use Chantix when ready  Left hip pain Following with orthopedics.  NSAIDs as needed.  Depression, recurrent ([place]) Planning to follow-up with grief counseling.  Phone number for virtual behavioral health given as well.  Follow up plan: 3 months, sooner if needed Assunta Found, MD [place]

## 2018-09-07 ENCOUNTER — Encounter: Payer: Self-pay | Admitting: Pediatrics

## 2018-09-07 ENCOUNTER — Other Ambulatory Visit: Payer: Self-pay

## 2018-09-07 MED ORDER — BLOOD GLUCOSE MONITOR KIT: PACK | 0 refills | Status: DC

## 2018-09-11 ENCOUNTER — Other Ambulatory Visit: Payer: Self-pay | Admitting: Pediatrics

## 2018-09-11 NOTE — Telephone Encounter (Signed)
Last seen 09/06/2018

## 2018-09-14 ENCOUNTER — Ambulatory Visit (INDEPENDENT_AMBULATORY_CARE_PROVIDER_SITE_OTHER): Payer: Medicaid Other | Admitting: Orthopaedic Surgery

## 2018-09-14 ENCOUNTER — Encounter (INDEPENDENT_AMBULATORY_CARE_PROVIDER_SITE_OTHER): Payer: Self-pay | Admitting: Orthopaedic Surgery

## 2018-09-14 VITALS — BP 123/72 | HR 80 | Ht 61.0 in | Wt 224.0 lb

## 2018-09-14 DIAGNOSIS — M5442 Lumbago with sciatica, left side: Secondary | ICD-10-CM | POA: Diagnosis not present

## 2018-09-14 DIAGNOSIS — M7062 Trochanteric bursitis, left hip: Secondary | ICD-10-CM | POA: Insufficient documentation

## 2018-09-14 NOTE — Addendum Note (Signed)
Addended by: Meyer Cory on: 09/14/2018 11:35 AM   Modules accepted: Orders

## 2018-09-14 NOTE — Progress Notes (Signed)
Office Visit Note   Patient: Lisa Norton           Date of Birth: 12/07/1970           MRN: [id number] Visit Date: 09/14/2018              Requested by: Eustaquio Maize, MD [place], [place] [address] PCP: Eustaquio Maize, MD   Assessment & Plan: Visit Diagnoses:  1. Acute left-sided low back pain with left-sided sciatica   2. Trochanteric bursitis, left hip     Plan: Patient is not really having much back pain at this point.  I discussed with her that this can be related to disc degeneration with protrusion on the left side with either a lateral recess or foraminal protrusion.  She has significant pain with palpation of the trochanter and also gluteus medius will obtain MRI scan to evaluate her left hip and see her back after the scan is obtained.  We discussed using a cane in the opposite right hand and discussed appropriate sequence.  Follow-up after MRI of her hip.  Follow-Up Instructions: No follow-ups on file.   Orders:  No orders of the defined types were placed in this encounter.  No orders of the defined types were placed in this encounter.     Procedures: No procedures performed   Clinical Data: No additional findings.   Subjective: Chief Complaint  Patient presents with  . Left Hip - Pain, Follow-up    HPI 48 year old female returns with recurrent and persistent left trochanteric pain and left hip limp.  She had noted couple times where she is had some pins-and-needles sensation in her anterior thigh but not persistent.  She had to take some Levemir insulin 20 units to control the hyperglycemia related to her trochanteric bursal injection on the left.  She states her hip felt great for 1 day and then after that was actually more painful than it had been.  She is having a persistent limp, bothers her when she tries to work at [employer].  Patient does have some disc degeneration L3-4, L4-5 on plain radiographs.  She is also had previous cervical  fusion 2015.  Review of Systems updated 14 point system unchanged from 08/17/2018 visit.   Objective: Vital Signs: BP 123/72   Pulse 80   Ht 5\' 1"  (1.549 m)   Wt 224 lb (101.6 kg)   BMI 42.32 kg/m   Physical Exam Constitutional:      Appearance: She is well-developed.  HENT:     Head: Normocephalic.     Right Ear: External ear normal.     Left Ear: External ear normal.  Eyes:     Pupils: Pupils are equal, round, and reactive to light.  Neck:     Thyroid: No thyromegaly.     Trachea: No tracheal deviation.  Cardiovascular:     Rate and Rhythm: Normal rate.  Pulmonary:     Effort: Pulmonary effort is normal.  Abdominal:     Palpations: Abdomen is soft.  Skin:    General: Skin is warm and dry.  Neurological:     Mental Status: She is alert and oriented to person, place, and time.  Psychiatric:        Behavior: Behavior normal.     Ortho Exam patient is was a tenderness over the left greater trochanter.  Pain with resisted abduction and tenderness over the gluteus medius not quite as bad as palpation of the  trochanteric bursa.  Mild discomfort with internal and external rotation of her hip but she has full range of motion and points directly over the bursa where she has pain not anteriorly over the groin.  Distal pulses are palpable no pitting edema.  Specialty Comments:  No specialty comments available.  Imaging: No results found.   PMFS History: Patient Active Problem List   Diagnosis Date Noted  . Trochanteric bursitis, left hip 09/14/2018  . Acute left-sided low back pain with left-sided sciatica 07/06/2018  . Rectocele 01/05/2018  . Uterovaginal prolapse 01/05/2018  . Urinary incontinence, mixed 12/16/2017  . S/P cesarean section 02/12/2015  . Fetal demise, greater than 22 weeks, antepartum   . 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 Obesity 08/13/2014  . History of hypertension 08/12/2014  . C7 radiculopathy 02/11/2014  . Dyslipidemia 01/04/2011  . Type 2  diabetes mellitus ([place]) 01/04/2011  . TOBACCO ABUSE 12/12/2009  . DEPRESSION 12/12/2009  . Essential hypertension 12/12/2009   Past Medical History:  Diagnosis Date  . Anxiety   . COLD SORE 05/15/2010  . DEPRESSION 12/12/2009  . Diabetes mellitus without complication ([place])   . High cholesterol   . 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 HYPERTENSION 12/12/2009  . TOBACCO ABUSE 12/12/2009    Family History  Problem Relation Age of Onset  . Heart disease Mother   . Narcolepsy Mother     Past Surgical History:  Procedure Laterality Date  . BLADDER SUSPENSION    . CERVICAL FUSION    . CESAREAN SECTION WITH BILATERAL TUBAL LIGATION Bilateral 02/12/2015   Procedure: CESAREAN SECTION;  Surgeon: Florian Buff, MD;  Location: [facility];  Service: Obstetrics;  Laterality: Bilateral;  Fetal Demise  . CHOLECYSTECTOMY  2006  . FOOT SURGERY Right    Plantar, bone spurs  . LAPAROSCOPIC VAGINAL HYSTERECTOMY WITH SALPINGECTOMY  02/16/2018   [facility]  . TONSILLECTOMY  1988   Social History   Occupational History  . Not on file  Tobacco Use  . Smoking status: Current Every Day Smoker    Packs/day: 1.50    Years: 20.00    Pack years: 30.00    Types: Cigarettes  . Smokeless tobacco: Former Systems developer    Types: Snuff    Quit date: 05/05/1986  Substance and Sexual Activity  . Alcohol use: No  . Drug use: No  . Sexual activity: Not Currently    Birth control/protection: None    Comment: pt states she did not have a tubal

## 2018-09-18 ENCOUNTER — Ambulatory Visit (INDEPENDENT_AMBULATORY_CARE_PROVIDER_SITE_OTHER): Payer: Medicaid Other

## 2018-09-18 ENCOUNTER — Ambulatory Visit: Payer: Self-pay | Admitting: Surgery

## 2018-09-18 ENCOUNTER — Ambulatory Visit: Payer: Medicaid Other | Admitting: Podiatry

## 2018-09-18 ENCOUNTER — Encounter: Payer: Self-pay | Admitting: Podiatry

## 2018-09-18 DIAGNOSIS — M7661 Achilles tendinitis, right leg: Secondary | ICD-10-CM

## 2018-09-18 DIAGNOSIS — M722 Plantar fascial fibromatosis: Secondary | ICD-10-CM | POA: Diagnosis not present

## 2018-09-18 DIAGNOSIS — M659 Synovitis and tenosynovitis, unspecified: Secondary | ICD-10-CM

## 2018-09-18 DIAGNOSIS — M7662 Achilles tendinitis, left leg: Secondary | ICD-10-CM | POA: Diagnosis not present

## 2018-09-18 MED ORDER — MELOXICAM 15 MG PO TABS: 15.0000 mg | ORAL_TABLET | Freq: Every day | ORAL | 1 refills | Status: DC

## 2018-09-18 NOTE — H&P (View-Only) (Signed)
Lisa Norton Documented: 09/18/2018 8:39 AM Location: [facility] Patient #: [id number] DOB: October 10, 1970 Single / Language: Cleophus Molt / Race: White Female  History of Present Illness [name] MD; 09/18/2018 10:26 AM) The patient is a 48 year old female who presents with anal pain. Note for "Anal pain": 20` ` ` Patient sent for surgical consultation at the request of Assunta Found, MD, [facility]  Chief Complaint: Anal pain ` ` Patient returns. She had history and physical suspicious for anal fissure in September. I did recommend a prescription of diltiazem cream and to call us if she is not better. She follow-up with her primary care physician a couple weeks later was given Linzess [place] for some irregular constipation. She notes she got some partial relief but it never fully resolved. She then developed severe back pain and swelling by Dr. Lorin Mercy with orthopedics. She had some worsening constipation. The back pain seems to be getting under better control nonoperatively. However she is getting persistent bleeding and persistent burning type pain with defecation like she is ripping again. She is on LInZess, milk of magnesia, MiraLAX. Still constipated. Concerned. Wish to be reevaluated and see if something more aggressive could be done.   Prior Note: The patient is a morbidly obese female that developed anal pain since her total vaginal hysterectomy with posterior repair sacrospinous ligament fixation. RV septal repair. Bladder sling. 02/16/2018. Dr. Zigmund Daniel. Had persistent discomfort. Saw UroGyn Dr Zigmund Daniel postop- Rx Anusol. She saw her primary care physician 3 weeks ago. External hemorrhoids noted. Some perianal discomfort. Prescribed Anusol. Surgical consultation recommended. Patient has been using aposterior without much help. She struggled with some irregular bowels. She's been using milk of magnesia and lens [place] with some help.  Moves her bowels most every other day. Had a colonoscopy. She thinks that she had some type of rectal prolapse repair since she knew things were hanging out. Does not sound like she had any incontinence though. Perhaps this is more of a rectocele issue. Op report makes no discussion about suturing on the rectum or hemorrhoidal surgery. Suspect this was more of a rectocele suture repair at the most  No personal nor family history of GI/colon cancer, inflammatory bowel disease, irritable bowel syndrome, allergy such as Celiac Sprue, dietary/dairy problems, colitis, ulcers nor gastritis. No recent sick contacts/gastroenteritis. No travel outside the country. No changes in diet. No dysphagia to solids or liquids. No significant heartburn or reflux. No hematochezia, hematemesis, coffee ground emesis. No evidence of prior gastric/peptic ulceration.  (Review of systems as stated in this history (HPI) or in the review of systems. Otherwise all other 12 point ROS are negative) ` ` `   Problem List/Past Medical [name], MD; 09/18/2018 8:41 AM) ANAL FISSURE (K60.2) EXTERNAL HEMORRHOIDS WITH COMPLICATION (D32.2) PROLAPSED INTERNAL HEMORRHOIDS, GRADE 2 (K64.1)  Past Surgical History [name], MD; 09/18/2018 8:41 AM) Cesarean Section - 1 Foot Surgery Right. Gallbladder Surgery - Laparoscopic Spinal Surgery - Neck Tonsillectomy  Diagnostic Studies History [name], MD; 09/18/2018 8:41 AM) Colonoscopy never Mammogram >3 years ago Pap Smear 1-5 years ago  Allergies Illene Regulus, CMA; 09/18/2018 8:41 AM) MetFORMIN & Diet Manage Prod *ANTIDIABETICS* Allergies Reconciled  Medication History (Alisha Spillers, CMA; 09/18/2018 8:41 AM) DILTIAZEM Gel (2% Gel, 1 (one) External four times daily, Taken starting 05/29/2018) Active. (Apply on anus for 3-6 weeks to allow fissure to heal) Cyclobenzaprine HCl (10MG  Tablet, Oral) Active. Gabapentin (100MG   Capsule, Oral) Active. Hydrocortisone Acetate (25MG   Suppository, Rectal) Active. Levemir (100UNIT/ML Solution, Subcutaneous) Active. Naproxen (500MG  Tablet, Oral) Active. Pantoprazole Sodium (40MG  Tablet DR, Oral) Active. raNITIdine HCl (150MG  Tablet, Oral) Active. Victoza (18MG /3ML Soln Pen-inj, Subcutaneous) Active. Medications Reconciled  Social History [name], MD; 09/18/2018 8:41 AM) Alcohol use Occasional alcohol use. Caffeine use Carbonated beverages. Illicit drug use Remotely quit drug use. Tobacco use Current every day smoker.  Family History [name], MD; 09/18/2018 8:41 AM) Depression Daughter. Heart Disease Mother. Heart disease in female family member before age 45  Pregnancy / Birth History [name], MD; 09/18/2018 8:41 AM) Age at menarche 45 years. Contraceptive History Oral contraceptives. Gravida 2 Maternal age 83-30 Para 1  Other Problems [name], MD; 09/18/2018 8:41 AM) Anxiety Disorder Arthritis Back Pain Cholelithiasis Depression Diabetes Mellitus Gastroesophageal Reflux Disease Hemorrhoids High blood pressure Hypercholesterolemia Seizure Disorder     Review of Systems [name], MD; 09/18/2018 8:42 AM) General Present- Appetite Loss and Fatigue. Not Present- Chills, Fever, Night Sweats, Weight Gain and Weight Loss. HEENT Present- Hoarseness and Wears glasses/contact lenses. Not Present- Earache, Hearing Loss, Nose Bleed, Oral Ulcers, Ringing in the Ears, Seasonal Allergies, Sinus Pain, Sore Throat, Visual Disturbances and Yellow Eyes. Respiratory Not Present- Bloody sputum, Chronic Cough, Difficulty Breathing, Snoring and Wheezing. Cardiovascular Present- Leg Cramps and Swelling of Extremities. Not Present- Chest Pain, Difficulty Breathing Lying Down, Palpitations, Rapid Heart Rate and Shortness of Breath. Gastrointestinal Present- Bloating, Change in Bowel Habits, Constipation,  Gets full quickly at meals, Hemorrhoids, Indigestion and Rectal Pain. Not Present- Abdominal Pain, Bloody Stool, Chronic diarrhea, Difficulty Swallowing, Excessive gas, Nausea and Vomiting. Female Genitourinary Present- Urgency. Not Present- Frequency, Nocturia, Painful Urination and Pelvic Pain. Musculoskeletal Present- Back Pain, Joint Pain and Joint Stiffness. Not Present- Muscle Pain, Muscle Weakness and Swelling of Extremities. Neurological Present- Tingling. Not Present- Decreased Memory, Fainting, Headaches, Numbness, Seizures, Tremor, Trouble walking and Weakness. Psychiatric Present- Anxiety, Change in Sleep Pattern, Depression, Fearful and Frequent crying. Not Present- Bipolar. Endocrine Present- Cold Intolerance and Heat Intolerance. Not Present- Excessive Hunger, Hair Changes, Hot flashes and New Diabetes. Hematology Present- Easy Bruising. Not Present- Excessive bleeding, Gland problems, HIV and Persistent Infections.  Vitals (Alisha Spillers CMA; 09/18/2018 8:40 AM) 09/18/2018 8:40 AM Weight: 223.2 lb Height: 61in Body Surface Area: 1.98 m Body Mass Index: 42.17 kg/m  Temp.: 97.35F(Oral)  Pulse: 83 (Regular)  BP: 124/76 (Sitting, Left Arm, Standard)      Physical Exam [name] MD; 09/18/2018 8:54 AM)  General Mental Status-Alert. General Appearance-Not in acute distress, Not Sickly. Orientation-Oriented X3. Hydration-Well hydrated. Voice-Normal.  Integumentary Global Assessment Normal Exam - Axillae: non-tender, no inflammation or ulceration, no drainage. and Distribution of scalp and body hair is normal. General Characteristics Temperature - normal warmth is noted.  Head and Neck Head-normocephalic, atraumatic with no lesions or palpable masses. Face Global Assessment - atraumatic, no absence of expression. Neck Global Assessment - no abnormal movements, no bruit auscultated on the right, no bruit auscultated on the left, no  decreased range of motion, non-tender. Trachea-midline. Thyroid Gland Characteristics - non-tender.  Eye Eyeball - Left-Extraocular movements intact, No Nystagmus. Eyeball - Right-Extraocular movements intact, No Nystagmus. Cornea - Left-No Hazy. Cornea - Right-No Hazy. Sclera/Conjunctiva - Left-No scleral icterus, No Discharge. Sclera/Conjunctiva - Right-No scleral icterus, No Discharge. Pupil - Left-Direct reaction to light normal. Pupil - Right-Direct reaction to light normal.  ENMT Ears Pinna - Left - no drainage observed, no generalized tenderness observed. Right - no drainage observed,  no generalized tenderness observed. Nose and Sinuses Nose - no destructive lesion observed. Nares - Left - quiet respiration. Right - quiet respiration. Mouth and Throat Lips - Upper Lip - no fissures observed, no pallor noted. Lower Lip - no fissures observed, no pallor noted. Nasopharynx - no discharge present. Oral Cavity/Oropharynx - Tongue - no dryness observed. Oral Mucosa - no cyanosis observed. Hypopharynx - no evidence of airway distress observed.  Chest and Lung Exam Inspection Movements - Normal and Symmetrical. Accessory muscles - No use of accessory muscles in breathing. Palpation Normal exam - Non-tender. Auscultation Breath sounds - Normal and Clear.  Cardiovascular Auscultation Rhythm - Regular. Murmurs & Other Heart Sounds - Normal exam - No Murmurs and No Systolic Clicks.  Abdomen Inspection Normal Exam - No Visible peristalsis and No Abnormal pulsations. Umbilicus - No Bleeding, No Urine drainage. Palpation/Percussion Normal exam - Soft, Non Tender, No Rebound tenderness, No Rigidity (guarding) and No Cutaneous hyperesthesia. Note: Abdomen morbidly obese but soft. Mild suprabuccal diastases recti. No umbilical or other anterior abdominal wall hernias  Female Genitourinary Sexual Maturity Tanner 5 - Adult hair pattern. Note: No vaginal bleeding  nor discharge  Rectal Note: Tenderness increased sphincter tone with probable chronic wound suspicious for persistent posterior fissure. Increased sphincter tone. Circumferential hemorrhoidal tags. Story suspicious for a right posterior prolapsed hemorrhoid as well. Held off on digital exam given pain and discomfort. No obvious abscess.  Perianal skin clean with good hygiene. No pruritis ani. No pilonidal disease. No abscess/fistula. No condyloma warts. Held off on any digital internal exam. Exam done with assistance of female Medical Assistant in the room.  Peripheral Vascular Upper Extremity Inspection - Left - No Cyanotic nailbeds, Not Ischemic. Right - No Cyanotic nailbeds, Not Ischemic.  Neurologic Neurologic evaluation reveals -normal attention span and ability to concentrate, able to name objects and repeat phrases. Appropriate fund of knowledge , normal sensation and normal coordination. Mental Status Affect - not angry, not paranoid. Cranial Nerves-Normal Bilaterally. Gait-Normal.  Neuropsychiatric Mental status exam performed with findings of-able to articulate well with normal speech/language, rate, volume and coherence, thought content normal with ability to perform basic computations and apply abstract reasoning and no evidence of hallucinations, delusions, obsessions or homicidal/suicidal ideation.  Musculoskeletal Global Assessment Spine, Ribs and Pelvis - no instability, subluxation or laxity. Right Upper Extremity - no instability, subluxation or laxity.  Lymphatic Head & Neck General Head & Neck Lymphatics: Bilateral - Description - No Localized lymphadenopathy. Axillary General Axillary Region: Bilateral - Description - No Localized lymphadenopathy. Femoral & Inguinal Generalized Femoral & Inguinal Lymphatics: Left: Right - Description - No Localized lymphadenopathy. Description - No Localized lymphadenopathy.    Assessment & Plan [name] MD; 09/18/2018 10:27 AM)  ANAL FISSURE (K60.2) Impression: Pain with defecation and obvious fissure. Refractory to Anusol suppositories. Persistent despite trial of diltiazem cream. Still with some constipation but somewhat improved on LinZess, milk of magnesia, MiraLAX.  Examination under anesthesia with possible partial internal sphincterotomy. Possible hemorrhoidal ligation and pexy. She is convinced that she had some type of rectal prolapse repair but as best I can gather from the operative report, that did not really happen. She is not having any complaints of prolapse or leaking at this time.  Current Plans Pt Education - [facility] Anal Fissure (Jahnessa Vanduyn) The anatomy & physiology of the anorectal region was discussed. The pathophysiology of anal fissure and differential diagnosis was discussed. Natural history progression was discussed. I stressed the importance of a bowel  regimen to have daily soft bowel movements to minimize progression of disease.  The patient's condition is not adequately controlled. Non-operative treatment has not healed the fissure. Therefore, I recommended examination under anesthesia for better examination to confirm the diagnosis and treat by lateral internal sphincterotomy to relax the spasm better & allow the fissure to heal. Technique, benefits, alternatives were discussed. I noted a good likelihood this will help address the problem. Risks such as bleeding, pain, incontinence, recurrence, heart attack, death, and other risks were discussed.  Educational handouts further explaining the pathology, treatment options, and bowel regimen were given as well. The patient expressed understanding & wishes to proceed with surgery.   EXTERNAL HEMORRHOIDS WITH COMPLICATION (W09.8) Impression: Persistent external hemorrhoids and will require removal at the time of surgery. I don't no filling get her anatomy totally normal, but at least improved.   PROLAPSED  INTERNAL HEMORRHOIDS, GRADE 4 (K64.3) Impression: Worsening internal hemorrhoids. I think she requires ligation and suture prexy with probable hemorrhoidectomy soon.  The anatomy & physiology of the anorectal region was discussed. The pathophysiology of hemorrhoids and differential diagnosis was discussed. Natural history progression was discussed. I stressed the importance of a bowel regimen to have daily soft bowel movements to minimize progression of disease. Goal of one BM / day ideal. Use of wet wipes, warm baths, avoiding straining, etc were emphasized.  Educational handouts further explaining the pathology, treatment options, and bowel regimen were given as well. The patient expressed understanding.  Current Plans The anatomy & physiology of the anorectal region was discussed. The pathophysiology of hemorrhoids and differential diagnosis was discussed. Natural history risks without surgery was discussed. I stressed the importance of a bowel regimen to have daily soft bowel movements to minimize progression of disease. Interventions such as sclerotherapy & banding were discussed.  The patient's symptoms are not adequately controlled by medicines and other non-operative treatments. I feel the risks & problems of no surgery outweigh the operative risks; therefore, I recommended surgery to treat the hemorrhoids by ligation, pexy, and possible resection.  Risks such as bleeding, infection, urinary difficulties, need for further treatment, heart attack, death, and other risks were discussed. I noted a good likelihood this will help address the problem. Goals of post-operative recovery were discussed as well. Possibility that this will not correct all symptoms was explained. Post-operative pain, bleeding, constipation, and other problems after surgery were discussed. We will work to minimize complications. Educational handouts further explaining the pathology, treatment options, and bowel  regimen were given as well. Questions were answered. The patient expresses understanding & wishes to proceed with surgery.  Pt Education - [facility] Hemorrhoids (Djuna Frechette): discussed with patient and provided information.  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 She is only doing a half scoop a day. Has very mild dose. Perhaps increase that and hold off on milk of magnesia. Continue the lens this. I noted that 1 soft bowel movement a day simplest insurance policy against future hemorrhoid fissure, other anorectal issues. She will more strongly reconsider it.  Current Plans Pt Education - [facility] Good Bowel Health (Alistair Senft) Pt Education - [facility] IBS patient info: discussed with patient and provided information.  ENCOUNTER FOR PREOPERATIVE EXAMINATION FOR GENERAL SURGICAL PROCEDURE (Z01.818)  Current Plans Pt Education - [facility] Rectal Prep for Anorectal outpatient/office surgery: discussed with patient and provided information. Pt Education - [facility] Rectal Surgery HCI (  Keziah Drotar): discussed with patient and provided information. You are being scheduled for surgery- Our schedulers will call you.  You should hear from our office's scheduling department within 5 working days about the location, date, and time of surgery. We try to make accommodations for patient's preferences in scheduling surgery, but sometimes the OR schedule or the surgeon's schedule prevents [place] from making those accommodations.  If you have not heard from our office [phone]) in 5 working days, call the office and ask for your surgeon's nurse.  If you have other questions about your diagnosis, plan, or surgery, call the office and ask for your surgeon's nurse.  [name], MD, FACS, MASCRS [facility] and [facility]    [address], [place], [place]  [id number] [phone] Main / Paging [phone] Fax

## 2018-09-18 NOTE — H&P (Signed)
Lisa Norton Documented: 09/18/2018 8:39 AM Location: [facility] Patient #: [id number] DOB: 1971-08-10 Single / Language: Lisa Norton / Race: White Female  History of Present Illness Lisa Hector MD; 09/18/2018 10:26 AM) The patient is a 48 year old female who presents with anal pain. Note for "Anal pain": 20` ` ` Patient sent for surgical consultation at the request of Lisa Found, MD, [facility]  Chief Complaint: Anal pain ` ` Patient returns. She had history and physical suspicious for anal fissure in September. I did recommend a prescription of diltiazem cream and to call us if she is not better. She follow-up with her primary care physician a couple weeks later was given Linzess [place] for some irregular constipation. She notes she got some partial relief but it never fully resolved. She then developed severe back pain and swelling by Dr. Lorin Norton with orthopedics. She had some worsening constipation. The back pain seems to be getting under better control nonoperatively. However she is getting persistent bleeding and persistent burning type pain with defecation like she is ripping again. She is on LInZess, milk of magnesia, MiraLAX. Still constipated. Concerned. Wish to be reevaluated and see if something more aggressive could be done.   Prior Note: The patient is a morbidly obese female that developed anal pain since her total vaginal hysterectomy with posterior repair sacrospinous ligament fixation. RV septal repair. Bladder sling. 02/16/2018. Lisa Norton. Had persistent discomfort. Saw UroGyn Dr Zigmund Norton postop- Rx Anusol. She saw her primary care physician 3 weeks ago. External hemorrhoids noted. Some perianal discomfort. Prescribed Anusol. Surgical consultation recommended. Patient has been using aposterior without much help. She struggled with some irregular bowels. She's been using milk of magnesia and lens [place] with some help.  Moves her bowels most every other day. Had a colonoscopy. She thinks that she had some type of rectal prolapse repair since she knew things were hanging out. Does not sound like she had any incontinence though. Perhaps this is more of a rectocele issue. Op report makes no discussion about suturing on the rectum or hemorrhoidal surgery. Suspect this was more of a rectocele suture repair at the most  No personal nor family history of GI/colon cancer, inflammatory bowel disease, irritable bowel syndrome, allergy such as Celiac Sprue, dietary/dairy problems, colitis, ulcers nor gastritis. No recent sick contacts/gastroenteritis. No travel outside the country. No changes in diet. No dysphagia to solids or liquids. No significant heartburn or reflux. No hematochezia, hematemesis, coffee ground emesis. No evidence of prior gastric/peptic ulceration.  (Review of systems as stated in this history (HPI) or in the review of systems. Otherwise all other 12 point ROS are negative) ` ` `   Problem List/Past Medical Lisa Hector, MD; 09/18/2018 8:41 AM) ANAL FISSURE (K60.2) EXTERNAL HEMORRHOIDS WITH COMPLICATION (J18.8) PROLAPSED INTERNAL HEMORRHOIDS, GRADE 2 (K64.1)  Past Surgical History Lisa Hector, MD; 09/18/2018 8:41 AM) Cesarean Section - 1 Foot Surgery Right. Gallbladder Surgery - Laparoscopic Spinal Surgery - Neck Tonsillectomy  Diagnostic Studies History Lisa Hector, MD; 09/18/2018 8:41 AM) Colonoscopy never Mammogram >3 years ago Pap Smear 1-5 years ago  Allergies Lisa Norton, CMA; 09/18/2018 8:41 AM) MetFORMIN & Diet Manage Prod *ANTIDIABETICS* Allergies Reconciled  Medication History (Lisa Norton, CMA; 09/18/2018 8:41 AM) DILTIAZEM Gel (2% Gel, 1 (one) External four times daily, Taken starting 05/29/2018) Active. (Apply on anus for 3-6 weeks to allow fissure to heal) Cyclobenzaprine HCl (10MG  Tablet, Oral) Active. Gabapentin (100MG   Capsule, Oral) Active. Hydrocortisone Acetate (25MG   Suppository, Rectal) Active. Levemir (100UNIT/ML Solution, Subcutaneous) Active. Naproxen (500MG  Tablet, Oral) Active. Pantoprazole Sodium (40MG  Tablet DR, Oral) Active. raNITIdine HCl (150MG  Tablet, Oral) Active. Victoza (18MG /3ML Soln Pen-inj, Subcutaneous) Active. Medications Reconciled  Social History Lisa Hector, MD; 09/18/2018 8:41 AM) Alcohol use Occasional alcohol use. Caffeine use Carbonated beverages. Illicit drug use Remotely quit drug use. Tobacco use Current every day smoker.  Family History Lisa Hector, MD; 09/18/2018 8:41 AM) Depression Daughter. Heart Disease Mother. Heart disease in female family member before age 47  Pregnancy / Birth History Lisa Hector, MD; 09/18/2018 8:41 AM) Age at menarche 40 years. Contraceptive History Oral contraceptives. Gravida 2 Maternal age 1-30 Para 1  Other Problems Lisa Hector, MD; 09/18/2018 8:41 AM) Anxiety Disorder Arthritis Back Pain Cholelithiasis Depression Diabetes Mellitus Gastroesophageal Reflux Disease Hemorrhoids High blood pressure Hypercholesterolemia Seizure Disorder     Review of Systems Lisa Hector, MD; 09/18/2018 8:42 AM) General Present- Appetite Loss and Fatigue. Not Present- Chills, Fever, Night Sweats, Weight Gain and Weight Loss. HEENT Present- Hoarseness and Wears glasses/contact lenses. Not Present- Earache, Hearing Loss, Nose Bleed, Oral Ulcers, Ringing in the Ears, Seasonal Allergies, Sinus Pain, Sore Throat, Visual Disturbances and Yellow Eyes. Respiratory Not Present- Bloody sputum, Chronic Cough, Difficulty Breathing, Snoring and Wheezing. Cardiovascular Present- Leg Cramps and Swelling of Extremities. Not Present- Chest Pain, Difficulty Breathing Lying Down, Palpitations, Rapid Heart Rate and Shortness of Breath. Gastrointestinal Present- Bloating, Change in Bowel Habits, Constipation,  Gets full quickly at meals, Hemorrhoids, Indigestion and Rectal Pain. Not Present- Abdominal Pain, Bloody Stool, Chronic diarrhea, Difficulty Swallowing, Excessive gas, Nausea and Vomiting. Female Genitourinary Present- Urgency. Not Present- Frequency, Nocturia, Painful Urination and Pelvic Pain. Musculoskeletal Present- Back Pain, Joint Pain and Joint Stiffness. Not Present- Muscle Pain, Muscle Weakness and Swelling of Extremities. Neurological Present- Tingling. Not Present- Decreased Memory, Fainting, Headaches, Numbness, Seizures, Tremor, Trouble walking and Weakness. Psychiatric Present- Anxiety, Change in Sleep Pattern, Depression, Fearful and Frequent crying. Not Present- Bipolar. Endocrine Present- Cold Intolerance and Heat Intolerance. Not Present- Excessive Hunger, Hair Changes, Hot flashes and New Diabetes. Hematology Present- Easy Bruising. Not Present- Excessive bleeding, Gland problems, HIV and Persistent Infections.  Vitals (Lisa Norton CMA; 09/18/2018 8:40 AM) 09/18/2018 8:40 AM Weight: 223.2 lb Height: 61in Body Surface Area: 1.98 m Body Mass Index: 42.17 kg/m  Temp.: 97.67F(Oral)  Pulse: 83 (Regular)  BP: 124/76 (Sitting, Left Arm, Standard)      Physical Exam Lisa Hector MD; 09/18/2018 8:54 AM)  General Mental Status-Alert. General Appearance-Not in acute distress, Not Sickly. Orientation-Oriented X3. Hydration-Well hydrated. Voice-Normal.  Integumentary Global Assessment Normal Exam - Axillae: non-tender, no inflammation or ulceration, no drainage. and Distribution of scalp and body hair is normal. General Characteristics Temperature - normal warmth is noted.  Head and Neck Head-normocephalic, atraumatic with no lesions or palpable masses. Face Global Assessment - atraumatic, no absence of expression. Neck Global Assessment - no abnormal movements, no bruit auscultated on the right, no bruit auscultated on the left, no  decreased range of motion, non-tender. Trachea-midline. Thyroid Gland Characteristics - non-tender.  Eye Eyeball - Left-Extraocular movements intact, No Nystagmus. Eyeball - Right-Extraocular movements intact, No Nystagmus. Cornea - Left-No Hazy. Cornea - Right-No Hazy. Sclera/Conjunctiva - Left-No scleral icterus, No Discharge. Sclera/Conjunctiva - Right-No scleral icterus, No Discharge. Pupil - Left-Direct reaction to light normal. Pupil - Right-Direct reaction to light normal.  ENMT Ears Pinna - Left - no drainage observed, no generalized tenderness observed. Right - no drainage observed,  no generalized tenderness observed. Nose and Sinuses Nose - no destructive lesion observed. Nares - Left - quiet respiration. Right - quiet respiration. Mouth and Throat Lips - Upper Lip - no fissures observed, no pallor noted. Lower Lip - no fissures observed, no pallor noted. Nasopharynx - no discharge present. Oral Cavity/Oropharynx - Tongue - no dryness observed. Oral Mucosa - no cyanosis observed. Hypopharynx - no evidence of airway distress observed.  Chest and Lung Exam Inspection Movements - Normal and Symmetrical. Accessory muscles - No use of accessory muscles in breathing. Palpation Normal exam - Non-tender. Auscultation Breath sounds - Normal and Clear.  Cardiovascular Auscultation Rhythm - Regular. Murmurs & Other Heart Sounds - Normal exam - No Murmurs and No Systolic Clicks.  Abdomen Inspection Normal Exam - No Visible peristalsis and No Abnormal pulsations. Umbilicus - No Bleeding, No Urine drainage. Palpation/Percussion Normal exam - Soft, Non Tender, No Rebound tenderness, No Rigidity (guarding) and No Cutaneous hyperesthesia. Note: Abdomen morbidly obese but soft. Mild suprabuccal diastases recti. No umbilical or other anterior abdominal wall hernias  Female Genitourinary Sexual Maturity Tanner 5 - Adult hair pattern. Note: No vaginal bleeding  nor discharge  Rectal Note: Tenderness increased sphincter tone with probable chronic wound suspicious for persistent posterior fissure. Increased sphincter tone. Circumferential hemorrhoidal tags. Story suspicious for a right posterior prolapsed hemorrhoid as well. Held off on digital exam given pain and discomfort. No obvious abscess.  Perianal skin clean with good hygiene. No pruritis ani. No pilonidal disease. No abscess/fistula. No condyloma warts. Held off on any digital internal exam. Exam done with assistance of female Medical Assistant in the room.  Peripheral Vascular Upper Extremity Inspection - Left - No Cyanotic nailbeds, Not Ischemic. Right - No Cyanotic nailbeds, Not Ischemic.  Neurologic Neurologic evaluation reveals -normal attention span and ability to concentrate, able to name objects and repeat phrases. Appropriate fund of knowledge , normal sensation and normal coordination. Mental Status Affect - not angry, not paranoid. Cranial Nerves-Normal Bilaterally. Gait-Normal.  Neuropsychiatric Mental status exam performed with findings of-able to articulate well with normal speech/language, rate, volume and coherence, thought content normal with ability to perform basic computations and apply abstract reasoning and no evidence of hallucinations, delusions, obsessions or homicidal/suicidal ideation.  Musculoskeletal Global Assessment Spine, Ribs and Pelvis - no instability, subluxation or laxity. Right Upper Extremity - no instability, subluxation or laxity.  Lymphatic Head & Neck General Head & Neck Lymphatics: Bilateral - Description - No Localized lymphadenopathy. Axillary General Axillary Region: Bilateral - Description - No Localized lymphadenopathy. Femoral & Inguinal Generalized Femoral & Inguinal Lymphatics: Left: Right - Description - No Localized lymphadenopathy. Description - No Localized lymphadenopathy.    Assessment & Plan Lisa Hector MD; 09/18/2018 10:27 AM)  ANAL FISSURE (K60.2) Impression: Pain with defecation and obvious fissure. Refractory to Anusol suppositories. Persistent despite trial of diltiazem cream. Still with some constipation but somewhat improved on LinZess, milk of magnesia, MiraLAX.  Examination under anesthesia with possible partial internal sphincterotomy. Possible hemorrhoidal ligation and pexy. She is convinced that she had some type of rectal prolapse repair but as best I can gather from the operative report, that did not really happen. She is not having any complaints of prolapse or leaking at this time.  Current Plans Pt Education - [facility] Anal Fissure (Tareka Jhaveri) The anatomy & physiology of the anorectal region was discussed. The pathophysiology of anal fissure and differential diagnosis was discussed. Natural history progression was discussed. I stressed the importance of a bowel  regimen to have daily soft bowel movements to minimize progression of disease.  The patient's condition is not adequately controlled. Non-operative treatment has not healed the fissure. Therefore, I recommended examination under anesthesia for better examination to confirm the diagnosis and treat by lateral internal sphincterotomy to relax the spasm better & allow the fissure to heal. Technique, benefits, alternatives were discussed. I noted a good likelihood this will help address the problem. Risks such as bleeding, pain, incontinence, recurrence, heart attack, death, and other risks were discussed.  Educational handouts further explaining the pathology, treatment options, and bowel regimen were given as well. The patient expressed understanding & wishes to proceed with surgery.   EXTERNAL HEMORRHOIDS WITH COMPLICATION (O29.4) Impression: Persistent external hemorrhoids and will require removal at the time of surgery. I don't no filling get her anatomy totally normal, but at least improved.   PROLAPSED  INTERNAL HEMORRHOIDS, GRADE 4 (K64.3) Impression: Worsening internal hemorrhoids. I think she requires ligation and suture prexy with probable hemorrhoidectomy soon.  The anatomy & physiology of the anorectal region was discussed. The pathophysiology of hemorrhoids and differential diagnosis was discussed. Natural history progression was discussed. I stressed the importance of a bowel regimen to have daily soft bowel movements to minimize progression of disease. Goal of one BM / day ideal. Use of wet wipes, warm baths, avoiding straining, etc were emphasized.  Educational handouts further explaining the pathology, treatment options, and bowel regimen were given as well. The patient expressed understanding.  Current Plans The anatomy & physiology of the anorectal region was discussed. The pathophysiology of hemorrhoids and differential diagnosis was discussed. Natural history risks without surgery was discussed. I stressed the importance of a bowel regimen to have daily soft bowel movements to minimize progression of disease. Interventions such as sclerotherapy & banding were discussed.  The patient's symptoms are not adequately controlled by medicines and other non-operative treatments. I feel the risks & problems of no surgery outweigh the operative risks; therefore, I recommended surgery to treat the hemorrhoids by ligation, pexy, and possible resection.  Risks such as bleeding, infection, urinary difficulties, need for further treatment, heart attack, death, and other risks were discussed. I noted a good likelihood this will help address the problem. Goals of post-operative recovery were discussed as well. Possibility that this will not correct all symptoms was explained. Post-operative pain, bleeding, constipation, and other problems after surgery were discussed. We will work to minimize complications. Educational handouts further explaining the pathology, treatment options, and bowel  regimen were given as well. Questions were answered. The patient expresses understanding & wishes to proceed with surgery.  Pt Education - [facility] Hemorrhoids (Tamieka Rancourt): discussed with patient and provided information.  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 She is only doing a half scoop a day. Has very mild dose. Perhaps increase that and hold off on milk of magnesia. Continue the lens this. I noted that 1 soft bowel movement a day simplest insurance policy against future hemorrhoid fissure, other anorectal issues. She will more strongly reconsider it.  Current Plans Pt Education - [facility] Good Bowel Health (Luie Laneve) Pt Education - [facility] IBS patient info: discussed with patient and provided information.  ENCOUNTER FOR PREOPERATIVE EXAMINATION FOR GENERAL SURGICAL PROCEDURE (Z01.818)  Current Plans Pt Education - [facility] Rectal Prep for Anorectal outpatient/office surgery: discussed with patient and provided information. Pt Education - [facility] Rectal Surgery HCI (  Giavanni Zeitlin): discussed with patient and provided information. You are being scheduled for surgery- Our schedulers will call you.  You should hear from our office's scheduling department within 5 working days about the location, date, and time of surgery. We try to make accommodations for patient's preferences in scheduling surgery, but sometimes the OR schedule or the surgeon's schedule prevents [place] from making those accommodations.  If you have not heard from our office [phone]) in 5 working days, call the office and ask for your surgeon's nurse.  If you have other questions about your diagnosis, plan, or surgery, call the office and ask for your surgeon's nurse.  Lisa Hector, MD, FACS, MASCRS Gastrointestinal and Minimally Invasive Surgery    [address], [place], [place]  [address] [phone] Main / Paging [phone] Fax

## 2018-09-25 ENCOUNTER — Encounter: Payer: Self-pay | Admitting: Pediatrics

## 2018-09-25 NOTE — Progress Notes (Signed)
   Subjective:  Patient presents today status post right ankle arthroscopy and EPF. DOS: 09/29/17. She reports continued ankle pain around the incision site. She describes it as a constant nagging pain and tenderness. There are no modifying factors noted. She has not done anything for treatment. Patient is here for further evaluation and treatment.    Past Medical History:  Diagnosis Date  . Anxiety   . COLD SORE 05/15/2010  . DEPRESSION 12/12/2009  . Diabetes mellitus without complication ([place])   . High cholesterol   . 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 HYPERTENSION 12/12/2009  . TOBACCO ABUSE 12/12/2009      Objective: Physical Exam General: The patient is alert and oriented x3 in no acute distress.  Dermatology: Skin is cool, dry and supple bilateral lower extremities. Negative for open lesions or macerations.  Vascular: Palpable pedal pulses bilaterally. No edema or erythema noted. Capillary refill within normal limits.  Neurological: Epicritic and protective threshold grossly intact bilaterally.   Musculoskeletal Exam: Tenderness to palpation to the plantar aspect of the bilateral heels along the plantar fascia. Pain with palpation noted to the medial, anterior and lateral aspects of the ankles bilaterally. All pedal and ankle joints range of motion within normal limits bilateral. Muscle strength 5/5 in all groups bilateral.   Radiographic Exam:  Orthopedic hardware and osteotomies sites appear to be stable with routine healing.   Assessment: 1. s/p right ankle arthroscopy and EPF. DOS: 09/29/17. 2. Plantar fasciitis bilateral  3. Ankle synovitis bilateral    Plan of Care:  1. Patient was evaluated. X-Rays reviewed.  2. Injection of 0.5 mLs Celestone Soluspan injected into the ankle joints bilaterally.  3. Injection of 0.5 mLs Celestone Soluspan injected into the bilateral heels.  4. Prescription for Meloxicam provided to patient. 5. Recommended good shoe gear.  6. Return to  clinic in 4 weeks for possible surgical consult for the LLE.   Goes by Amy.    Edrick Kins, DPM [facility]  Dr. Edrick Kins, [address]                                        [place], [place] [address]                Office [phone]  Fax [phone]

## 2018-09-26 ENCOUNTER — Other Ambulatory Visit: Payer: Self-pay | Admitting: Physician Assistant

## 2018-09-26 ENCOUNTER — Encounter: Payer: Self-pay | Admitting: Pediatrics

## 2018-09-26 ENCOUNTER — Other Ambulatory Visit: Payer: Self-pay

## 2018-09-26 ENCOUNTER — Other Ambulatory Visit: Payer: Self-pay | Admitting: *Deleted

## 2018-09-26 DIAGNOSIS — E1165 Type 2 diabetes mellitus with hyperglycemia: Secondary | ICD-10-CM

## 2018-09-26 MED ORDER — LIRAGLUTIDE 18 MG/3ML ~~LOC~~ SOPN: 1.2000 mg | PEN_INJECTOR | Freq: Every day | SUBCUTANEOUS | 2 refills | Status: DC

## 2018-09-26 MED ORDER — INSULIN DETEMIR 100 UNIT/ML ~~LOC~~ SOLN: 20.0000 [IU] | Freq: Every day | SUBCUTANEOUS | 2 refills | Status: DC

## 2018-09-26 MED ORDER — INSULIN DETEMIR 100 UNIT/ML FLEXPEN: 20.0000 [IU] | Freq: Every day | SUBCUTANEOUS | Status: DC

## 2018-09-26 MED ORDER — INSULIN DETEMIR 100 UNIT/ML FLEXPEN: 20.0000 [IU] | Freq: Every day | SUBCUTANEOUS | 2 refills | Status: DC

## 2018-09-26 NOTE — Addendum Note (Signed)
Addended by: Terald Sleeper on: 09/26/2018 10:50 AM   Modules accepted: Orders

## 2018-09-26 NOTE — Progress Notes (Signed)
VIncent pt - needs rf on insulins.

## 2018-10-02 NOTE — Patient Instructions (Addendum)
[facility]  10/02/2018   Your procedure is scheduled on: Thursday 10/12/2018  Report to [facility]  En[facility]              Report to admitting at 1000 AM    Call this number if you have problems the morning of surgery [phone]    How to Manage Your Diabetes Before and After Surgery  Why is it important to control my blood sugar before and after surgery? . Improving blood sugar levels before and after surgery helps healing and can limit problems. . A way of improving blood sugar control is eating a healthy diet by: o  Eating less sugar and carbohydrates o  Increasing activity/exercise o  Talking with your doctor about reaching your blood sugar goals . High blood sugars (greater than 180 mg/dL) can raise your risk of infections and slow your recovery, so you will need to focus on controlling your diabetes during the weeks before surgery. . Make sure that the doctor who takes care of your diabetes knows about your planned surgery including the date and location.  How do I manage my blood sugar before surgery? . Check your blood sugar at least 4 times a day, starting 2 days before surgery, to make sure that the level is not too high or low. o Check your blood sugar the morning of your surgery when you wake up and every 2 hours until you get to the Short Stay unit. . If your blood sugar is less than 70 mg/dL, you will need to treat for low blood sugar: o Do not take insulin. o Treat a low blood sugar (less than 70 mg/dL) with  cup of clear juice (cranberry or apple), 4 glucose tablets, OR glucose gel. o Recheck blood sugar in 15 minutes after treatment (to make sure it is greater than 70 mg/dL). If your blood sugar is not greater than 70 mg/dL on recheck, call [phone] for further instructions. . Report your blood sugar to the short stay nurse when you get to Short Stay.  . If you are admitted to the hospital after surgery: o Your blood sugar will  be checked by the staff and you will probably be given insulin after surgery (instead of oral diabetes medicines) to make sure you have good blood sugar levels. o The goal for blood sugar control after surgery is 80-180 mg/dL.   WHAT DO I DO ABOUT MY DIABETES MEDICATION?        The day before surgery, Do Not Take [facility]  [facility])!        The day before surgery, Take Liraglutide (Victoza) as usual!  . Do not take oral diabetes medicines (pills) the morning of surgery.  . THE NIGHT BEFORE SURGERY, take   10   units of  Insulin Detemir (Levemir)     insulin.        . The day of surgery, do not take other diabetes injectables, including Byetta (exenatide), Bydureon (exenatide ER), Victoza (liraglutide), or Trulicity (dulaglutide).     Remember :No food after midnight on Tuesday  10/10/2018!  You will be following Bowel Prep instructions from Dr. Johney Norton on Wednesday 10/11/2018 and drinking clear liquids all day!   NO SOLID FOOD AFTER MIDNIGHT THE NIGHT PRIOR TO SURGERY. NOTHING BY MOUTH EXCEPT CLEAR LIQUIDS UNTIL 3 HOURS PRIOR TO [facility] SURGERY. PLEASE FINISH ENSURE DRINK PER SURGEON ORDER 3  HOURS PRIOR TO SCHEDULED SURGERY TIME WHICH NEEDS TO BE COMPLETED AT   0900 am   CLEAR LIQUID DIET   Foods Allowed                                                                     Foods Excluded  Coffee and tea, regular and decaf                             liquids that you cannot  Plain Jell-O in any flavor                                             see through such as: Fruit ices (not with fruit pulp)                                     milk, soups, orange juice  Iced Popsicles                                    All solid food Carbonated beverages, regular and diet                                    Cranberry, grape and apple juices Sports drinks like Gatorade Lightly seasoned clear broth or consume(fat free) Sugar, honey syrup  Sample Menu Breakfast                                 Lunch                                     Supper Cranberry juice                    Beef broth                            Chicken broth Jell-O                                     Grape juice                           Apple juice Coffee or tea                        Jell-O                                      Popsicle  Coffee or tea                        Coffee or tea  ___________________________________________________________________              BRUSH YOUR TEETH MORNING OF SURGERY AND RINSE YOUR MOUTH OUT, NO CHEWING GUM CANDY OR MINTS.     Take these medicines the morning of surgery with A SIP OF WATER: Pravastatin (Pravachol), Ranitidine (Zantac)  DO NOT TAKE ANY DIABETIC MEDICATIONS DAY OF YOUR SURGERY!                               You may not have any metal on your body including hair pins and              piercings  Do not wear jewelry, make-up, lotions, powders or perfumes, deodorant             Do not wear nail polish.  Do not shave  48 hours prior to surgery.               Do not bring valuables to the hospital. [facility].  Contacts, dentures or bridgework may not be worn into surgery.  Leave suitcase in the car. After surgery it may be brought to your room.     Patients discharged the day of surgery will not be allowed to drive home. IF YOU ARE HAVING SURGERY AND GOING HOME THE SAME DAY, YOU MUST HAVE AN ADULT TO DRIVE YOU HOME AND BE WITH YOU FOR 24 HOURS. YOU MAY GO HOME BY TAXI OR UBER OR ORTHERWISE, BUT AN ADULT MUST ACCOMPANY YOU HOME AND STAY WITH YOU FOR 24 HOURS.  Name and phone number of your driver:mother- [facility]               Please read over the following fact sheets you were given: _____________________________________________________________________             [facility] - Preparing for Surgery Before surgery, you can play an important role.   Because skin is not sterile, your skin needs to be as free of germs as possible.  You can reduce the number of germs on your skin by washing with CHG (chlorahexidine gluconate) soap before surgery.  CHG is an antiseptic cleaner which kills germs and bonds with the skin to continue killing germs even after washing. Please DO NOT use if you have an allergy to CHG or antibacterial soaps.  If your skin becomes reddened/irritated stop using the CHG and inform your nurse when you arrive at Short Stay. Do not shave (including legs and underarms) for at least 48 hours prior to the first CHG shower.  You may shave your face/neck. Please follow these instructions carefully:  1.  Shower with CHG Soap the night before surgery and the  morning of Surgery.  2.  If you choose to wash your hair, wash your hair first as usual with your  normal  shampoo.  3.  After you shampoo, rinse your hair and body thoroughly to remove the  shampoo.                           4.  Use CHG as you would any other liquid soap.  You can apply chg directly  to the skin and wash                       Gently with a scrungie or clean washcloth.  5.  Apply the CHG Soap to your body ONLY FROM THE NECK DOWN.   Do not use on face/ open                           Wound or open sores. Avoid contact with eyes, ears mouth and genitals (private parts).                       Wash face,  Genitals (private parts) with your normal soap.             6.  Wash thoroughly, paying special attention to the area where your surgery  will be performed.  7.  Thoroughly rinse your body with warm water from the neck down.  8.  DO NOT shower/wash with your normal soap after using and rinsing off  the CHG Soap.                9.  Pat yourself dry with a clean towel.            10.  Wear clean pajamas.            11.  Place clean sheets on your bed the night of your first shower and do not  sleep with pets. Day of Surgery : Do not apply any lotions/deodorants the  morning of surgery.  Please wear clean clothes to the hospital/surgery center.  FAILURE TO FOLLOW THESE INSTRUCTIONS MAY RESULT IN THE CANCELLATION OF YOUR SURGERY PATIENT SIGNATURE_________________________________  NURSE SIGNATURE__________________________________  ________________________________________________________________________

## 2018-10-03 ENCOUNTER — Other Ambulatory Visit: Payer: Self-pay

## 2018-10-03 ENCOUNTER — Encounter (HOSPITAL_COMMUNITY)
Admission: RE | Admit: 2018-10-03 | Discharge: 2018-10-03 | Disposition: A | Discharge status: Home / Self Care | Payer: Medicaid Other | Source: Ambulatory Visit | Attending: Surgery | Admitting: Surgery

## 2018-10-03 ENCOUNTER — Encounter (HOSPITAL_COMMUNITY): Payer: Self-pay | Admitting: *Deleted

## 2018-10-03 DIAGNOSIS — K649 Unspecified hemorrhoids: Secondary | ICD-10-CM | POA: Insufficient documentation

## 2018-10-03 DIAGNOSIS — I10 Essential (primary) hypertension: Secondary | ICD-10-CM | POA: Diagnosis not present

## 2018-10-03 DIAGNOSIS — Z01818 Encounter for other preprocedural examination: Secondary | ICD-10-CM | POA: Diagnosis not present

## 2018-10-03 DIAGNOSIS — K601 Chronic anal fissure: Secondary | ICD-10-CM | POA: Insufficient documentation

## 2018-10-03 DIAGNOSIS — E119 Type 2 diabetes mellitus without complications: Secondary | ICD-10-CM | POA: Diagnosis not present

## 2018-10-03 LAB — BASIC METABOLIC PANEL
Anion gap: 10 (ref 5–15)
BUN: 20 mg/dL (ref 6–20)
CO2: 23 mmol/L (ref 22–32)
Calcium: 8.8 mg/dL — ABNORMAL LOW (ref 8.9–10.3)
Chloride: 104 mmol/L (ref 98–111)
Creatinine, Ser: 0.92 mg/dL (ref 0.44–1.00)
GFR calc non Af Amer: >60 mL/min (ref >60)
Glucose, Bld: 311 mg/dL — ABNORMAL HIGH (ref 70–99)
Potassium: 3.2 mmol/L — ABNORMAL LOW (ref 3.5–5.1)
Sodium: 137 mmol/L (ref 135–145)

## 2018-10-03 LAB — CBC
HCT: 49.7 % — ABNORMAL HIGH (ref 36.0–46.0)
Hemoglobin: 16 g/dL — ABNORMAL HIGH (ref 12.0–15.0)
MCH: 31.9 pg (ref 26.0–34.0)
MCHC: 32.2 g/dL (ref 30.0–36.0)
MCV: 99.2 fL (ref 80.0–100.0)
PLATELETS: 170 10*3/uL (ref 150–400)
RBC: 5.01 MIL/uL (ref 3.87–5.11)
RDW: 13.7 % (ref 11.5–15.5)
WBC: 6.7 10*3/uL (ref 4.0–10.5)
nRBC: 0 % (ref 0.0–0.2)

## 2018-10-03 LAB — HCG, SERUM, QUALITATIVE: Preg, Serum: NEGATIVE

## 2018-10-03 LAB — GLUCOSE, CAPILLARY: Glucose-Capillary: 300 mg/dL — ABNORMAL HIGH (ref 70–99)

## 2018-10-03 NOTE — Progress Notes (Signed)
Patient's Glucose -300 this am at pre-op appointment. Patient stated that she forgot to take her Insulin and Victoza injection last night and did not take her Wilder Glade this am. Janett Billow PA for Anesthesia notified . Patient's HgA1C on 09/06/18 was 8.1. Informed patient that she needs to follow her instructions for taking her Diabetic medications  So her surgery would not be cancelled. Patient verbalized understanding.

## 2018-10-04 ENCOUNTER — Encounter: Payer: Self-pay | Admitting: *Deleted

## 2018-10-04 ENCOUNTER — Encounter: Payer: Self-pay | Admitting: Pediatrics

## 2018-10-05 ENCOUNTER — Ambulatory Visit: Payer: Medicaid Other | Admitting: Family Medicine

## 2018-10-05 ENCOUNTER — Encounter: Payer: Self-pay | Admitting: Family Medicine

## 2018-10-05 VITALS — BP 135/90 | HR 81 | Temp 97.7°F | Ht 61.0 in | Wt 223.0 lb

## 2018-10-05 DIAGNOSIS — B373 Candidiasis of vulva and vagina: Secondary | ICD-10-CM | POA: Diagnosis not present

## 2018-10-05 DIAGNOSIS — K219 Gastro-esophageal reflux disease without esophagitis: Secondary | ICD-10-CM | POA: Diagnosis not present

## 2018-10-05 DIAGNOSIS — B3731 Acute candidiasis of vulva and vagina: Secondary | ICD-10-CM

## 2018-10-05 DIAGNOSIS — Z01818 Encounter for other preprocedural examination: Secondary | ICD-10-CM | POA: Diagnosis not present

## 2018-10-05 LAB — GLUCOSE HEMOCUE WAIVED: GLU HEMOCUE WAIVED: 160 mg/dL — AB (ref 65–99)

## 2018-10-05 MED ORDER — RANITIDINE HCL 150 MG PO TABS: 150.0000 mg | ORAL_TABLET | Freq: Every day | ORAL | 3 refills | Status: DC

## 2018-10-05 MED ORDER — FLUCONAZOLE 150 MG PO TABS: 150.0000 mg | ORAL_TABLET | Freq: Once | ORAL | 0 refills | Status: AC

## 2018-10-05 NOTE — Patient Instructions (Signed)
Diabetes Mellitus and Nutrition, Adult  When you have diabetes (diabetes mellitus), it is very important to have healthy eating habits because your blood sugar (glucose) levels are greatly affected by what you eat and drink. Eating healthy foods in the appropriate amounts, at about the same times every day, can help you:  · Control your blood glucose.  · Lower your risk of heart disease.  · Improve your blood pressure.  · Reach or maintain a healthy weight.  Every person with diabetes is different, and each person has different needs for a meal plan. Your health care provider may recommend that you work with a diet and nutrition specialist (dietitian) to make a meal plan that is best for you. Your meal plan may vary depending on factors such as:  · The calories you need.  · The medicines you take.  · Your weight.  · Your blood glucose, blood pressure, and cholesterol levels.  · Your activity level.  · Other health conditions you have, such as heart or kidney disease.  How do carbohydrates affect me?  Carbohydrates, also called carbs, affect your blood glucose level more than any other type of food. Eating carbs naturally raises the amount of glucose in your blood. Carb counting is a method for keeping track of how many carbs you eat. Counting carbs is important to keep your blood glucose at a healthy level, especially if you use insulin or take certain oral diabetes medicines.  It is important to know how many carbs you can safely have in each meal. This is different for every person. Your dietitian can help you calculate how many carbs you should have at each meal and for each snack.  Foods that contain carbs include:  · Bread, cereal, rice, pasta, and crackers.  · Potatoes and corn.  · Peas, beans, and lentils.  · Milk and yogurt.  · Fruit and juice.  · Desserts, such as cakes, cookies, ice cream, and candy.  How does alcohol affect me?  Alcohol can cause a sudden decrease in blood glucose (hypoglycemia),  especially if you use insulin or take certain oral diabetes medicines. Hypoglycemia can be a life-threatening condition. Symptoms of hypoglycemia (sleepiness, dizziness, and confusion) are similar to symptoms of having too much alcohol.  If your health care provider says that alcohol is safe for you, follow these guidelines:  · Limit alcohol intake to no more than 1 drink per day for nonpregnant women and 2 drinks per day for men. One drink equals 12 oz of beer, 5 oz of wine, or 1½ oz of hard liquor.  · Do not drink on an empty stomach.  · Keep yourself hydrated with water, diet soda, or unsweetened iced tea.  · Keep in mind that regular soda, juice, and other mixers may contain a lot of sugar and must be counted as carbs.  What are tips for following this plan?    Reading food labels  · Start by checking the serving size on the "Nutrition Facts" label of packaged foods and drinks. The amount of calories, carbs, fats, and other nutrients listed on the label is based on one serving of the item. Many items contain more than one serving per package.  · Check the total grams (g) of carbs in one serving. You can calculate the number of servings of carbs in one serving by dividing the total carbs by 15. For example, if a food has 30 g of total carbs, it would be equal to 2   servings of carbs.  · Check the number of grams (g) of saturated and trans fats in one serving. Choose foods that have low or no amount of these fats.  · Check the number of milligrams (mg) of salt (sodium) in one serving. Most people should limit total sodium intake to less than 2,300 mg per day.  · Always check the nutrition information of foods labeled as "low-fat" or "nonfat". These foods may be higher in added sugar or refined carbs and should be avoided.  · Talk to your dietitian to identify your daily goals for nutrients listed on the label.  Shopping  · Avoid buying canned, premade, or processed foods. These foods tend to be high in fat, sodium,  and added sugar.  · Shop around the outside edge of the grocery store. This includes fresh fruits and vegetables, bulk grains, fresh meats, and fresh dairy.  Cooking  · Use low-heat cooking methods, such as baking, instead of high-heat cooking methods like deep frying.  · Cook using healthy oils, such as olive, canola, or sunflower oil.  · Avoid cooking with butter, cream, or high-fat meats.  Meal planning  · Eat meals and snacks regularly, preferably at the same times every day. Avoid going long periods of time without eating.  · Eat foods high in fiber, such as fresh fruits, vegetables, beans, and whole grains. Talk to your dietitian about how many servings of carbs you can eat at each meal.  · Eat 4-6 ounces (oz) of lean protein each day, such as lean meat, chicken, fish, eggs, or tofu. One oz of lean protein is equal to:  ? 1 oz of meat, chicken, or fish.  ? 1 egg.  ? ¼ cup of tofu.  · Eat some foods each day that contain healthy fats, such as avocado, nuts, seeds, and fish.  Lifestyle  · Check your blood glucose regularly.  · Exercise regularly as told by your health care provider. This may include:  ? 150 minutes of moderate-intensity or vigorous-intensity exercise each week. This could be brisk walking, biking, or water aerobics.  ? Stretching and doing strength exercises, such as yoga or weightlifting, at least 2 times a week.  · Take medicines as told by your health care provider.  · Do not use any products that contain nicotine or tobacco, such as cigarettes and e-cigarettes. If you need help quitting, ask your health care provider.  · Work with a counselor or diabetes educator to identify strategies to manage stress and any emotional and social challenges.  Questions to ask a health care provider  · Do I need to meet with a diabetes educator?  · Do I need to meet with a dietitian?  · What number can I call if I have questions?  · When are the best times to check my blood glucose?  Where to find more  information:  · American Diabetes Association: diabetes.org  · Academy of Nutrition and Dietetics: www.eatright.org  · National Institute of Diabetes and Digestive and Kidney Diseases (NIH): www.niddk.nih.gov  Summary  · A healthy meal plan will help you control your blood glucose and maintain a healthy lifestyle.  · Working with a diet and nutrition specialist (dietitian) can help you make a meal plan that is best for you.  · Keep in mind that carbohydrates (carbs) and alcohol have immediate effects on your blood glucose levels. It is important to count carbs and to use alcohol carefully.  This information is not intended to   replace advice given to you by your health care provider. Make sure you discuss any questions you have with your health care provider.  Document Released: 05/13/2005 Document Revised: 03/16/2017 Document Reviewed: 09/20/2016  [employer] Interactive Patient Education © 2019 [employer]

## 2018-10-05 NOTE — Progress Notes (Signed)
Subjective:  Patient ID: Lisa Norton, female    DOB: 22-May-1971, 48 y.o.   MRN: [id number]  Chief Complaint:  Surgical clearance (blood sugar was 300 at hospital visit the other day, wanted her to be seen by PCP)   HPI: Lisa Norton is a 48 y.o. female presenting on 10/05/2018 for Surgical clearance (blood sugar was 300 at hospital visit the other day, wanted her to be seen by PCP)   1. Preoperative clearance  Pt is a 48 y.o. female who is here for preoperative clearance for anal sphincterotomy on 10/12/2018. Pt had preoperative workup completed on 10/03/2018. Her blood sugar was elevated, 311, at this time. Pt states she had not taken her medications prior to the blood draw and had not taken them the day before. Pt denies polyuria, polydipsia, or poly[hagia. She states her AM blood sugars over the last 4 days were 160, 141, 132, 188.  1) High Risk Cardiac Conditions  1) Recent MI - No.  2) Decompensated Heart Failure - No.  3) Unstable angina - No.  4) Symptomatic arrythmia - No.  5) Sx Valvular Disease - No.  2) Intermediate Risk Factors - DM, CKD, CVA, CHF, CAD - Yes.    3) Surgery Specific Risk - Low (Endoscopic, Cataract, Breast )  4) Further Noninvasive evaluation -   1) EKG - Yes.     1) Hx of CVA, CAD, DM, CKD  5) Need for medical therapy - Beta Blocker, Statins indicated ? Yes.      2. Gastroesophageal reflux disease, esophagitis presence not specified   3. Vulvovaginitis due to Candida      Relevant past medical, surgical, family, and social history reviewed and updated as indicated.  Allergies and medications reviewed and updated.   Past Medical History:  Diagnosis Date  . Anxiety   . COLD SORE 05/15/2010  . DEPRESSION 12/12/2009  . Diabetes mellitus without complication ([place])   . High cholesterol   . 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 HYPERTENSION 12/12/2009  . TOBACCO ABUSE 12/12/2009    Past Surgical History:  Procedure Laterality Date  . BLADDER  SUSPENSION    . CERVICAL FUSION    . CESAREAN SECTION WITH BILATERAL TUBAL LIGATION Bilateral 02/12/2015   Procedure: CESAREAN SECTION;  Surgeon: Florian Buff, MD;  Location: [facility];  Service: Obstetrics;  Laterality: Bilateral;  Fetal Demise  . CHOLECYSTECTOMY  2006  . FOOT SURGERY Right    Plantar, bone spurs  . LAPAROSCOPIC VAGINAL HYSTERECTOMY WITH SALPINGECTOMY  02/16/2018   [facility]  . TONSILLECTOMY  1988    Social History   Socioeconomic History  . Marital status: Legally Separated    Spouse name: Not on file  . Number of children: 2  . Years of education: Not on file  . Highest education level: Not on file  Occupational History  . Not on file  Social Needs  . Financial resource strain: Not on file  . Food insecurity:    Worry: Not on file    Inability: Not on file  . Transportation needs:    Medical: Not on file    Non-medical: Not on file  Tobacco Use  . Smoking status: Current Every Day Smoker    Packs/day: 1.50    Years: 20.00    Pack years: 30.00    Types: Cigarettes  . Smokeless tobacco: Former Systems developer    Types: Snuff    Quit date: 05/05/1986  Substance and Sexual  Activity  . Alcohol use: No  . Drug use: No  . Sexual activity: Not Currently    Birth control/protection: None    Comment: pt states she did not have a tubal  Lifestyle  . Physical activity:    Days per week: Not on file    Minutes per session: Not on file  . Stress: Not on file  Relationships  . Social connections:    Talks on phone: Not on file    Gets together: Not on file    Attends religious service: Not on file    Active member of club or organization: Not on file    Attends meetings of clubs or organizations: Not on file    Relationship status: Not on file  . Intimate partner violence:    Fear of current or ex partner: Not on file    Emotionally abused: Not on file    Physically abused: Not on file    Forced sexual activity: Not on file  Other Topics  Concern  . Not on file  Social History Narrative  . Not on file    Outpatient Encounter Medications as of 10/05/2018  Medication Sig  . blood glucose meter kit and supplies KIT Accucheck Compact Plus. Meter and supplies  . Blood Glucose Monitoring Suppl (TRUE METRIX AIR GLUCOSE METER) w/Device KIT 1 kit by Does not apply route 3 (three) times daily.  . cyclobenzaprine (FLEXERIL) 10 MG tablet Take 1 tablet (10 mg total) by mouth 3 (three) times daily as needed for muscle spasms.  . dapagliflozin propanediol (FARXIGA) 10 MG TABS tablet Take 10 mg by mouth daily.  Marland Kitchen gabapentin (NEURONTIN) 100 MG capsule TAKE ONE TABLET BY MOUTH 3 TIMES DAILY (Patient taking differently: Take 100 mg by mouth 3 (three) times daily as needed (for pain). )  . insulin detemir (LEVEMIR) 100 UNIT/ML injection Inject 0.2 mLs (20 Units total) into the skin at bedtime.  Marland Kitchen linaclotide (LINZESS) 72 MCG capsule Take 72 mcg by mouth daily before breakfast.  . liraglutide (VICTOZA) 18 MG/3ML SOPN Inject 0.2 mLs (1.2 mg total) into the skin at bedtime.  Marland Kitchen loratadine (CLARITIN) 10 MG tablet Take 10 mg by mouth daily.  . magnesium hydroxide (MILK OF MAGNESIA) 400 MG/5ML suspension Take 5-10 mLs by mouth daily as needed for mild constipation.  . meloxicam (MOBIC) 15 MG tablet Take 1 tablet (15 mg total) by mouth daily for 30 days.  . polyethylene glycol powder (GLYCOLAX/MIRALAX) powder Take 17 g by mouth 2 (two) times daily as needed. (Patient taking differently: Take 17 g by mouth 2 (two) times daily as needed for moderate constipation. )  . pravastatin (PRAVACHOL) 80 MG tablet Take 1 tablet (80 mg total) by mouth daily.  . ranitidine (ZANTAC) 150 MG tablet Take 1 tablet (150 mg total) by mouth daily.  . sertraline (ZOLOFT) 50 MG tablet Take 1 tablet (50 mg total) by mouth daily. (Patient taking differently: Take 50 mg by mouth at bedtime. )  . varenicline (CHANTIX CONTINUING MONTH PAK) 1 MG tablet Take 1 tablet (1 mg total) by  mouth 2 (two) times daily.  . varenicline (CHANTIX STARTING MONTH PAK) 0.5 MG X 11 & 1 MG X 42 tablet TAKE AS DIRECTED  . fluconazole (DIFLUCAN) 150 MG tablet Take 1 tablet (150 mg total) by mouth once for 1 dose.  . [DISCONTINUED] ranitidine (ZANTAC) 150 MG tablet Take 1 tablet (150 mg total) by mouth daily.   Facility-Administered Encounter Medications as of 10/05/2018  Medication  . betamethasone acetate-betamethasone sodium phosphate (CELESTONE) injection 3 mg    Allergies  Allergen Reactions  . Metformin And Related Diarrhea    Review of Systems  Constitutional: Negative for activity change, appetite change, diaphoresis, fatigue, fever and unexpected weight change.  Respiratory: Negative for apnea, cough, choking, chest tightness, shortness of breath, wheezing and stridor.   Cardiovascular: Negative for chest pain, palpitations and leg swelling.  Gastrointestinal: Negative for abdominal distention, abdominal pain, diarrhea, nausea and vomiting.  Endocrine: Negative for cold intolerance, heat intolerance, polydipsia, polyphagia and polyuria.  Genitourinary: Positive for vaginal discharge (thick, white). Negative for decreased urine volume, difficulty urinating, flank pain and urgency.  Musculoskeletal: Negative for arthralgias and myalgias.  Neurological: Negative for dizziness, tremors, seizures, syncope, facial asymmetry, speech difficulty, weakness, light-headedness, numbness and headaches.  Hematological: Negative for adenopathy. Does not bruise/bleed easily.  Psychiatric/Behavioral: Negative for confusion.  All other systems reviewed and are negative.       Objective:  BP 135/90   Pulse 81   Temp 97.7 F (36.5 C) (Oral)   Ht '5\' 1"'  (1.549 m)   Wt 223 lb (101.2 kg)   LMP 09/07/2017   BMI 42.14 kg/m    Wt Readings from Last 3 Encounters:  10/05/18 223 lb (101.2 kg)  10/03/18 221 lb (100.2 kg)  09/14/18 224 lb (101.6 kg)    Physical Exam Vitals signs and nursing  note reviewed.  Constitutional:      General: She is not in acute distress.    Appearance: Normal appearance. She is not ill-appearing or toxic-appearing.  HENT:     Head: Normocephalic and atraumatic.     Nose: Nose normal.     Mouth/Throat:     Mouth: Mucous membranes are moist.     Pharynx: Oropharynx is clear.  Eyes:     Extraocular Movements: Extraocular movements intact.     Conjunctiva/sclera: Conjunctivae normal.     Pupils: Pupils are equal, round, and reactive to light.  Neck:     Musculoskeletal: Normal range of motion and neck supple.  Cardiovascular:     Rate and Rhythm: Normal rate and regular rhythm.     Pulses: Normal pulses.     Heart sounds: Normal heart sounds. No murmur. No friction rub. No gallop.   Pulmonary:     Effort: Pulmonary effort is normal.     Breath sounds: Normal breath sounds.  Abdominal:     General: Bowel sounds are normal.     Palpations: Abdomen is soft.     Tenderness: There is no abdominal tenderness.  Genitourinary:    Comments: White discharge, erythema to internal labia Musculoskeletal: Normal range of motion.  Skin:    General: Skin is warm and dry.     Capillary Refill: Capillary refill takes less than 2 seconds.  Neurological:     General: No focal deficit present.     Mental Status: She is alert and oriented to person, place, and time.  Psychiatric:        Mood and Affect: Mood normal.        Behavior: Behavior normal. Behavior is cooperative.        Thought Content: Thought content normal.        Judgment: Judgment normal.     Results for orders placed or performed during the hospital encounter of 65/03/54  Basic metabolic panel  Result Value Ref Range   Sodium 137 135 - 145 mmol/L   Potassium 3.2 (L) 3.5 - 5.1 mmol/L  Chloride 104 98 - 111 mmol/L   CO2 23 22 - 32 mmol/L   Glucose, Bld 311 (H) 70 - 99 mg/dL   BUN 20 6 - 20 mg/dL   Creatinine, Ser 0.92 0.44 - 1.00 mg/dL   Calcium 8.8 (L) 8.9 - 10.3 mg/dL   GFR calc  non Af Amer >60 >60 mL/min   GFR calc Af Amer >60 >60 mL/min   Anion gap 10 5 - 15  CBC  Result Value Ref Range   WBC 6.7 4.0 - 10.5 K/uL   RBC 5.01 3.87 - 5.11 MIL/uL   Hemoglobin 16.0 (H) 12.0 - 15.0 g/dL   HCT 49.7 (H) 36.0 - 46.0 %   MCV 99.2 80.0 - 100.0 fL   MCH 31.9 26.0 - 34.0 pg   MCHC 32.2 30.0 - 36.0 g/dL   RDW 13.7 11.5 - 15.5 %   Platelets 170 150 - 400 K/uL   nRBC 0.0 0.0 - 0.2 %  hCG, serum, qualitative  Result Value Ref Range   Preg, Serum NEGATIVE NEGATIVE  Glucose, capillary  Result Value Ref Range   Glucose-Capillary 300 (H) 70 - 99 mg/dL   Comment 1 Document in Chart      Glucose 160 in office today.   Pertinent labs & imaging results that were available during my care of the patient were reviewed by me and considered in my medical decision making.  Assessment & Plan:  Marine was seen today for surgical clearance.  Diagnoses and all orders for this visit:  Preoperative clearance I have independently evaluated patient.  Lisa Norton is a 48 y.o. female who is intermediate risk for a low risk surgery.  There are  modifiable risk factors: smoking. -     Glucose Hemocue Waived  Gastroesophageal reflux disease, esophagitis presence not specified Well controlled, continue below.  -     ranitidine (ZANTAC) 150 MG tablet; Take 1 tablet (150 mg total) by mouth daily.  Vulvovaginitis due to Candida Vaginal hygiene discussed. Medications as prescribed. Report any new or worsening symptoms.  -     fluconazole (DIFLUCAN) 150 MG tablet; Take 1 tablet (150 mg total) by mouth once for 1 dose.     Continue all other maintenance medications.  Follow up plan: Return in about 2 months (around 12/04/2018).  Educational handout given for diabetes  The above assessment and management plan was discussed with the patient. The patient verbalized understanding of and has agreed to the management plan. Patient is aware to call the clinic if symptoms persist or worsen.  Patient is aware when to return to the clinic for a follow-up visit. Patient educated on when it is appropriate to go to the emergency department.   Monia Pouch, FNP-C [facility] [phone]

## 2018-10-06 ENCOUNTER — Other Ambulatory Visit: Payer: Self-pay | Admitting: Pediatrics

## 2018-10-06 DIAGNOSIS — E119 Type 2 diabetes mellitus without complications: Secondary | ICD-10-CM

## 2018-10-11 MED ORDER — BUPIVACAINE LIPOSOME 1.3 % IJ SUSP
20.0000 mL | INTRAMUSCULAR | Status: DC
  Filled 2018-10-11: qty 20

## 2018-10-12 ENCOUNTER — Ambulatory Visit (HOSPITAL_COMMUNITY): Payer: Medicaid Other | Admitting: Anesthesiology

## 2018-10-12 ENCOUNTER — Other Ambulatory Visit: Payer: Self-pay

## 2018-10-12 ENCOUNTER — Encounter (HOSPITAL_COMMUNITY)
Admission: RE | Disposition: A | Discharge status: Home / Self Care | Payer: Self-pay | Source: Home / Self Care | Attending: Surgery

## 2018-10-12 ENCOUNTER — Ambulatory Visit (HOSPITAL_COMMUNITY)
Admission: RE | Admit: 2018-10-12 | Discharge: 2018-10-12 | Disposition: A | Discharge status: Home / Self Care | Payer: Medicaid Other | Attending: Surgery | Admitting: Surgery

## 2018-10-12 ENCOUNTER — Ambulatory Visit (HOSPITAL_COMMUNITY): Payer: Medicaid Other | Admitting: Physician Assistant

## 2018-10-12 DIAGNOSIS — M1990 Unspecified osteoarthritis, unspecified site: Secondary | ICD-10-CM | POA: Diagnosis not present

## 2018-10-12 DIAGNOSIS — Z6841 Body Mass Index (BMI) 40.0 and over, adult: Secondary | ICD-10-CM | POA: Diagnosis not present

## 2018-10-12 DIAGNOSIS — K219 Gastro-esophageal reflux disease without esophagitis: Secondary | ICD-10-CM | POA: Diagnosis not present

## 2018-10-12 DIAGNOSIS — Z794 Long term (current) use of insulin: Secondary | ICD-10-CM | POA: Diagnosis not present

## 2018-10-12 DIAGNOSIS — E119 Type 2 diabetes mellitus without complications: Secondary | ICD-10-CM | POA: Insufficient documentation

## 2018-10-12 DIAGNOSIS — Z888 Allergy status to other drugs, medicaments and biological substances status: Secondary | ICD-10-CM | POA: Insufficient documentation

## 2018-10-12 DIAGNOSIS — K581 Irritable bowel syndrome with constipation: Secondary | ICD-10-CM | POA: Insufficient documentation

## 2018-10-12 DIAGNOSIS — Z79899 Other long term (current) drug therapy: Secondary | ICD-10-CM | POA: Diagnosis not present

## 2018-10-12 DIAGNOSIS — K643 Fourth degree hemorrhoids: Secondary | ICD-10-CM

## 2018-10-12 DIAGNOSIS — K601 Chronic anal fissure: Secondary | ICD-10-CM | POA: Diagnosis present

## 2018-10-12 DIAGNOSIS — K644 Residual hemorrhoidal skin tags: Secondary | ICD-10-CM | POA: Insufficient documentation

## 2018-10-12 DIAGNOSIS — E7800 Pure hypercholesterolemia, unspecified: Secondary | ICD-10-CM | POA: Insufficient documentation

## 2018-10-12 DIAGNOSIS — F419 Anxiety disorder, unspecified: Secondary | ICD-10-CM | POA: Diagnosis not present

## 2018-10-12 DIAGNOSIS — R32 Unspecified urinary incontinence: Secondary | ICD-10-CM | POA: Diagnosis not present

## 2018-10-12 DIAGNOSIS — F17210 Nicotine dependence, cigarettes, uncomplicated: Secondary | ICD-10-CM | POA: Insufficient documentation

## 2018-10-12 DIAGNOSIS — G40909 Epilepsy, unspecified, not intractable, without status epilepticus: Secondary | ICD-10-CM | POA: Diagnosis not present

## 2018-10-12 DIAGNOSIS — Z791 Long term (current) use of non-steroidal anti-inflammatories (NSAID): Secondary | ICD-10-CM | POA: Insufficient documentation

## 2018-10-12 DIAGNOSIS — F329 Major depressive disorder, single episode, unspecified: Secondary | ICD-10-CM | POA: Insufficient documentation

## 2018-10-12 DIAGNOSIS — K642 Third degree hemorrhoids: Secondary | ICD-10-CM | POA: Insufficient documentation

## 2018-10-12 HISTORY — PX: EVALUATION UNDER ANESTHESIA WITH HEMORRHOIDECTOMY: SHX5624

## 2018-10-12 LAB — GLUCOSE, CAPILLARY
Glucose-Capillary: 250 mg/dL — ABNORMAL HIGH (ref 70–99)
Glucose-Capillary: 120 mg/dL — ABNORMAL HIGH (ref 70–99)

## 2018-10-12 SURGERY — EXAM UNDER ANESTHESIA WITH HEMORRHOIDECTOMY
Anesthesia: General | Site: Rectum

## 2018-10-12 MED ORDER — SCOPOLAMINE 1 MG/3DAYS TD PT72
1.0000 | MEDICATED_PATCH | TRANSDERMAL | Status: DC
  Administered 2018-10-12: 1.5 mg via TRANSDERMAL
  Filled 2018-10-12: qty 1

## 2018-10-12 MED ORDER — DEXAMETHASONE SODIUM PHOSPHATE 10 MG/ML IJ SOLN
INTRAMUSCULAR | Status: AC
  Filled 2018-10-12: qty 1

## 2018-10-12 MED ORDER — DEXAMETHASONE SODIUM PHOSPHATE 10 MG/ML IJ SOLN
INTRAMUSCULAR | Status: DC | PRN
  Administered 2018-10-12: 4 mg via INTRAVENOUS

## 2018-10-12 MED ORDER — MIDAZOLAM HCL 2 MG/2ML IJ SOLN
INTRAMUSCULAR | Status: AC
  Filled 2018-10-12: qty 2

## 2018-10-12 MED ORDER — ROCURONIUM BROMIDE 100 MG/10ML IV SOLN
INTRAVENOUS | Status: AC
  Filled 2018-10-12: qty 1

## 2018-10-12 MED ORDER — HYDROMORPHONE HCL 1 MG/ML IJ SOLN
INTRAMUSCULAR | Status: AC
  Filled 2018-10-12: qty 1

## 2018-10-12 MED ORDER — FENTANYL CITRATE (PF) 100 MCG/2ML IJ SOLN
INTRAMUSCULAR | Status: AC
  Filled 2018-10-12: qty 2

## 2018-10-12 MED ORDER — PROPOFOL 10 MG/ML IV BOLUS
INTRAVENOUS | Status: AC
  Filled 2018-10-12: qty 20

## 2018-10-12 MED ORDER — OXYCODONE HCL 5 MG PO TABS: 5.0000 mg | ORAL_TABLET | Freq: Four times a day (QID) | ORAL | 0 refills | Status: DC | PRN

## 2018-10-12 MED ORDER — BUPIVACAINE HCL (PF) 0.25 % IJ SOLN
INTRAMUSCULAR | Status: DC | PRN
  Administered 2018-10-12: 20 mL

## 2018-10-12 MED ORDER — GABAPENTIN 300 MG PO CAPS
300.0000 mg | ORAL_CAPSULE | ORAL | Status: AC
  Administered 2018-10-12: 300 mg via ORAL
  Filled 2018-10-12: qty 1

## 2018-10-12 MED ORDER — SUGAMMADEX SODIUM 200 MG/2ML IV SOLN
INTRAVENOUS | Status: AC
  Filled 2018-10-12: qty 2

## 2018-10-12 MED ORDER — OXYCODONE HCL 5 MG PO TABS
ORAL_TABLET | ORAL | Status: AC
  Filled 2018-10-12: qty 1

## 2018-10-12 MED ORDER — CHLORHEXIDINE GLUCONATE CLOTH 2 % EX PADS: 6.0000 | MEDICATED_PAD | Freq: Once | CUTANEOUS | Status: DC

## 2018-10-12 MED ORDER — PROPOFOL 10 MG/ML IV BOLUS
INTRAVENOUS | Status: DC | PRN
  Administered 2018-10-12: 150 mg via INTRAVENOUS

## 2018-10-12 MED ORDER — MIDAZOLAM HCL 5 MG/5ML IJ SOLN
INTRAMUSCULAR | Status: DC | PRN
  Administered 2018-10-12: 2 mg via INTRAVENOUS

## 2018-10-12 MED ORDER — LACTATED RINGERS IV SOLN
INTRAVENOUS | Status: DC
  Administered 2018-10-12: 10:00:00 via INTRAVENOUS

## 2018-10-12 MED ORDER — DIBUCAINE 1 % RE OINT
TOPICAL_OINTMENT | RECTAL | Status: DC | PRN
  Administered 2018-10-12: 1 via RECTAL

## 2018-10-12 MED ORDER — OXYCODONE HCL 5 MG PO TABS
5.0000 mg | ORAL_TABLET | Freq: Once | ORAL | Status: AC
  Administered 2018-10-12: 5 mg via ORAL

## 2018-10-12 MED ORDER — CELECOXIB 200 MG PO CAPS
200.0000 mg | ORAL_CAPSULE | ORAL | Status: AC
  Administered 2018-10-12: 200 mg via ORAL
  Filled 2018-10-12: qty 1

## 2018-10-12 MED ORDER — SUGAMMADEX SODIUM 200 MG/2ML IV SOLN
INTRAVENOUS | Status: DC | PRN
  Administered 2018-10-12: 200 mg via INTRAVENOUS

## 2018-10-12 MED ORDER — MEPERIDINE HCL 50 MG/ML IJ SOLN: 6.2500 mg | INTRAMUSCULAR | Status: DC | PRN

## 2018-10-12 MED ORDER — GABAPENTIN 300 MG PO CAPS: 300.0000 mg | ORAL_CAPSULE | Freq: Two times a day (BID) | ORAL | 1 refills | Status: DC

## 2018-10-12 MED ORDER — LIDOCAINE 2% (20 MG/ML) 5 ML SYRINGE
INTRAMUSCULAR | Status: AC
  Filled 2018-10-12: qty 5

## 2018-10-12 MED ORDER — BUPIVACAINE LIPOSOME 1.3 % IJ SUSP
INTRAMUSCULAR | Status: DC | PRN
  Administered 2018-10-12: 20 mL

## 2018-10-12 MED ORDER — LIDOCAINE 2% (20 MG/ML) 5 ML SYRINGE
INTRAMUSCULAR | Status: DC | PRN
  Administered 2018-10-12: 100 mg via INTRAVENOUS

## 2018-10-12 MED ORDER — HYDROMORPHONE HCL 1 MG/ML IJ SOLN
0.2500 mg | INTRAMUSCULAR | Status: DC | PRN
  Administered 2018-10-12 (×2): 0.5 mg via INTRAVENOUS

## 2018-10-12 MED ORDER — CEFAZOLIN SODIUM-DEXTROSE 2-4 GM/100ML-% IV SOLN
2.0000 g | INTRAVENOUS | Status: AC
  Administered 2018-10-12: 2 g via INTRAVENOUS
  Filled 2018-10-12: qty 100

## 2018-10-12 MED ORDER — ACETAMINOPHEN 500 MG PO TABS
1000.0000 mg | ORAL_TABLET | ORAL | Status: AC
  Administered 2018-10-12: 1000 mg via ORAL
  Filled 2018-10-12: qty 2

## 2018-10-12 MED ORDER — FENTANYL CITRATE (PF) 100 MCG/2ML IJ SOLN
INTRAMUSCULAR | Status: DC | PRN
  Administered 2018-10-12 (×2): 50 ug via INTRAVENOUS

## 2018-10-12 MED ORDER — METRONIDAZOLE IN NACL 5-0.79 MG/ML-% IV SOLN
500.0000 mg | INTRAVENOUS | Status: AC
  Administered 2018-10-12: 500 mg via INTRAVENOUS
  Filled 2018-10-12: qty 100

## 2018-10-12 MED ORDER — DIBUCAINE 1 % RE OINT
TOPICAL_OINTMENT | RECTAL | Status: AC
  Filled 2018-10-12: qty 28

## 2018-10-12 MED ORDER — ROCURONIUM BROMIDE 10 MG/ML (PF) SYRINGE
PREFILLED_SYRINGE | INTRAVENOUS | Status: DC | PRN
  Administered 2018-10-12: 60 mg via INTRAVENOUS

## 2018-10-12 MED ORDER — ONDANSETRON HCL 4 MG/2ML IJ SOLN
INTRAMUSCULAR | Status: DC | PRN
  Administered 2018-10-12: 4 mg via INTRAVENOUS

## 2018-10-12 MED ORDER — MIDAZOLAM HCL 2 MG/2ML IJ SOLN: 0.5000 mg | Freq: Once | INTRAMUSCULAR | Status: DC | PRN

## 2018-10-12 MED ORDER — ONDANSETRON HCL 4 MG/2ML IJ SOLN
INTRAMUSCULAR | Status: AC
  Filled 2018-10-12: qty 2

## 2018-10-12 MED ORDER — BUPIVACAINE HCL (PF) 0.25 % IJ SOLN
INTRAMUSCULAR | Status: AC
  Filled 2018-10-12: qty 60

## 2018-10-12 MED ORDER — PROMETHAZINE HCL 25 MG/ML IJ SOLN: 6.2500 mg | INTRAMUSCULAR | Status: DC | PRN

## 2018-10-12 MED ORDER — 0.9 % SODIUM CHLORIDE (POUR BTL) OPTIME
TOPICAL | Status: DC | PRN
  Administered 2018-10-12: 1000 mL

## 2018-10-12 SURGICAL SUPPLY — 35 items
BENZOIN TINCTURE PRP APPL 2/3 (GAUZE/BANDAGES/DRESSINGS) ×2 IMPLANT
BLADE SURG 15 STRL LF DISP TIS (BLADE) IMPLANT
BLADE SURG 15 STRL SS (BLADE)
BRIEF STRETCH FOR OB PAD LRG (UNDERPADS AND DIAPERS) ×2 IMPLANT
CONT SPEC 4OZ CLIKSEAL STRL BL (MISCELLANEOUS) IMPLANT
COVER SURGICAL LIGHT HANDLE (MISCELLANEOUS) ×2 IMPLANT
COVER WAND RF STERILE (DRAPES) IMPLANT
DECANTER SPIKE VIAL GLASS SM (MISCELLANEOUS) ×2 IMPLANT
DRAPE LAPAROTOMY T 102X78X121 (DRAPES) ×2 IMPLANT
DRSG PAD ABDOMINAL 8X10 ST (GAUZE/BANDAGES/DRESSINGS) ×2 IMPLANT
ELECT PENCIL ROCKER SW 15FT (MISCELLANEOUS) ×2 IMPLANT
ELECT REM PT RETURN 15FT ADLT (MISCELLANEOUS) ×2 IMPLANT
GAUZE 4X4 16PLY RFD (DISPOSABLE) ×2 IMPLANT
GAUZE SPONGE 4X4 12PLY STRL (GAUZE/BANDAGES/DRESSINGS) ×2 IMPLANT
GLOVE BIOGEL PI IND STRL 7.5 (GLOVE) ×2 IMPLANT
GLOVE BIOGEL PI INDICATOR 7.5 (GLOVE) ×2
GLOVE ECLIPSE 8.0 STRL XLNG CF (GLOVE) ×2 IMPLANT
GLOVE INDICATOR 8.0 STRL GRN (GLOVE) ×2 IMPLANT
GOWN STRL REUS W/TWL XL LVL3 (GOWN DISPOSABLE) ×4 IMPLANT
KIT BASIN OR (CUSTOM PROCEDURE TRAY) ×2 IMPLANT
LOOP VESSEL MAXI BLUE (MISCELLANEOUS) IMPLANT
NEEDLE HYPO 22GX1.5 SAFETY (NEEDLE) ×2 IMPLANT
PACK BASIC VI WITH GOWN DISP (CUSTOM PROCEDURE TRAY) ×2 IMPLANT
SHEARS HARMONIC 9CM CVD (BLADE) IMPLANT
SURGILUBE 2OZ TUBE FLIPTOP (MISCELLANEOUS) ×2 IMPLANT
SUT CHROMIC 2 0 SH (SUTURE) ×2 IMPLANT
SUT CHROMIC 3 0 SH 27 (SUTURE) IMPLANT
SUT VIC AB 2-0 SH 27 (SUTURE)
SUT VIC AB 2-0 SH 27X BRD (SUTURE) IMPLANT
SUT VIC AB 2-0 UR6 27 (SUTURE) ×12 IMPLANT
SYR 20CC LL (SYRINGE) ×2 IMPLANT
SYR 3ML LL SCALE MARK (SYRINGE) IMPLANT
TOWEL OR 17X26 10 PK STRL BLUE (TOWEL DISPOSABLE) ×2 IMPLANT
TOWEL OR NON WOVEN STRL DISP B (DISPOSABLE) ×2 IMPLANT
YANKAUER SUCT BULB TIP 10FT TU (MISCELLANEOUS) ×2 IMPLANT

## 2018-10-12 NOTE — Interval H&P Note (Signed)
History and Physical Interval Note:  10/12/2018 9:58 AM  [name]  has presented today for surgery, with the diagnosis of ANAL FISSURE REFRACTORY TO MEDICAL MANAGEMENT GRADE 3 AND 4 INTERNAL  EXTERNAL HEMORRHOIDS  The various methods of treatment have been discussed with the patient and family. After consideration of risks, benefits and other options for treatment, the patient has consented to  Procedure(s): [place]. HEMORRHOID LIGATION/PEXY (N/A) as a surgical intervention .  The patient's history has been reviewed, patient examined, no change in status, stable for surgery.  I have reviewed the patient's chart and labs.  Questions were answered to the patient's satisfaction.    I have re-reviewed the the patient's records, history, medications, and allergies.  I have re-examined the patient.  I again discussed intraoperative plans and goals of post-operative recovery.  The patient agrees to proceed.  DURINDA BUZZELLI  12-09-1970 [id number]  Patient Care Team: Baruch Gouty, FNP as PCP - General (Family Medicine) Michael Boston, MD as Consulting Physician (General Surgery) Marti Sleigh, MD as Consulting Physician (Gynecology) Rakes, Connye Burkitt, FNP (Family Medicine)  Patient Active Problem List   Diagnosis Date Noted  . Trochanteric bursitis, left hip 09/14/2018  . Acute left-sided low back pain with left-sided sciatica 07/06/2018  . Rectocele 01/05/2018  . Uterovaginal prolapse 01/05/2018  . Urinary incontinence, mixed 12/16/2017  . S/P cesarean section 02/12/2015  . Fetal demise, greater than 22 weeks, antepartum   . 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 Obesity 08/13/2014  . History of hypertension 08/12/2014  . C7 radiculopathy 02/11/2014  . Dyslipidemia 01/04/2011  . Type 2 diabetes mellitus ([place]) 01/04/2011  . TOBACCO ABUSE 12/12/2009  . DEPRESSION 12/12/2009  . Essential hypertension 12/12/2009    Past  Medical History:  Diagnosis Date  . Anxiety   . COLD SORE 05/15/2010  . DEPRESSION 12/12/2009  . Diabetes mellitus without complication ([place])   . High cholesterol   . 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 HYPERTENSION 12/12/2009  . TOBACCO ABUSE 12/12/2009    Past Surgical History:  Procedure Laterality Date  . BLADDER SUSPENSION    . CERVICAL FUSION    . CESAREAN SECTION WITH BILATERAL TUBAL LIGATION Bilateral 02/12/2015   Procedure: CESAREAN SECTION;  Surgeon: Florian Buff, MD;  Location: [facility];  Service: Obstetrics;  Laterality: Bilateral;  Fetal Demise  . CHOLECYSTECTOMY  2006  . FOOT SURGERY Right    Plantar, bone spurs  . LAPAROSCOPIC VAGINAL HYSTERECTOMY WITH SALPINGECTOMY  02/16/2018   [facility]  . TONSILLECTOMY  1988    Social History   Socioeconomic History  . Marital status: Legally Separated    Spouse name: Not on file  . Number of children: 2  . Years of education: Not on file  . Highest education level: Not on file  Occupational History  . Not on file  Social Needs  . Financial resource strain: Not on file  . Food insecurity:    Worry: Not on file    Inability: Not on file  . Transportation needs:    Medical: Not on file    Non-medical: Not on file  Tobacco Use  . Smoking status: Current Every Day Smoker    Packs/day: 1.50    Years: 20.00    Pack years: 30.00    Types: Cigarettes  . Smokeless tobacco: Former Systems developer    Types: Snuff    Quit date: 05/05/1986  Substance and Sexual Activity  .  Alcohol use: No  . Drug use: No  . Sexual activity: Not Currently    Birth control/protection: None    Comment: pt states she did not have a tubal  Lifestyle  . Physical activity:    Days per week: Not on file    Minutes per session: Not on file  . Stress: Not on file  Relationships  . Social connections:    Talks on phone: Not on file    Gets together: Not on file    Attends religious service: Not on file    Active member of club or  organization: Not on file    Attends meetings of clubs or organizations: Not on file    Relationship status: Not on file  . Intimate partner violence:    Fear of current or ex partner: Not on file    Emotionally abused: Not on file    Physically abused: Not on file    Forced sexual activity: Not on file  Other Topics Concern  . Not on file  Social History Narrative  . Not on file    Family History  Problem Relation Age of Onset  . Heart disease Mother   . Narcolepsy Mother     Facility-Administered Medications Prior to Admission  Medication Dose Route Frequency Provider Last Rate Last Dose  . betamethasone acetate-betamethasone sodium phosphate (CELESTONE) injection 3 mg  3 mg Intramuscular Once Edrick Kins, DPM       Medications Prior to Admission  Medication Sig Dispense Refill Last Dose  . cyclobenzaprine (FLEXERIL) 10 MG tablet Take 1 tablet (10 mg total) by mouth 3 (three) times daily as needed for muscle spasms. 60 tablet 3 Past Month at Unknown time  . dapagliflozin propanediol (FARXIGA) 10 MG TABS tablet TAKE ONE (1) TABLET EACH DAY 90 tablet 0 Past Week at Unknown time  . gabapentin (NEURONTIN) 100 MG capsule TAKE ONE TABLET BY MOUTH 3 TIMES DAILY (Patient taking differently: Take 100 mg by mouth 3 (three) times daily as needed (for pain). ) 90 capsule 2 Past Month at Unknown time  . insulin detemir (LEVEMIR) 100 UNIT/ML injection Inject 0.2 mLs (20 Units total) into the skin at bedtime. 10 mL 2 10/11/2018 at Unknown time  . linaclotide (LINZESS) 72 MCG capsule Take 72 mcg by mouth daily before breakfast.   Taking  . liraglutide (VICTOZA) 18 MG/3ML SOPN Inject 0.2 mLs (1.2 mg total) into the skin at bedtime. 6 mL 2 10/11/2018 at 2100  . loratadine (CLARITIN) 10 MG tablet Take 10 mg by mouth daily.   Past Month at Unknown time  . magnesium hydroxide (MILK OF MAGNESIA) 400 MG/5ML suspension Take 5-10 mLs by mouth daily as needed for mild constipation.   10/11/2018 at 1430  .  polyethylene glycol powder (GLYCOLAX/MIRALAX) powder Take 17 g by mouth 2 (two) times daily as needed. (Patient taking differently: Take 17 g by mouth 2 (two) times daily as needed for moderate constipation. ) 3350 g 1 Taking  . pravastatin (PRAVACHOL) 80 MG tablet Take 1 tablet (80 mg total) by mouth daily. 90 tablet 1 10/11/2018 at Unknown time  . ranitidine (ZANTAC) 150 MG tablet Take 1 tablet (150 mg total) by mouth daily. 90 tablet 3 10/11/2018 at Unknown time  . sertraline (ZOLOFT) 50 MG tablet Take 1 tablet (50 mg total) by mouth daily. (Patient taking differently: Take 50 mg by mouth at bedtime. ) 30 tablet 3 Past Week at Unknown time  . blood glucose meter kit  and supplies KIT Accucheck Compact Plus. Meter and supplies 1 each 0 Taking  . Blood Glucose Monitoring Suppl (TRUE METRIX AIR GLUCOSE METER) w/Device KIT 1 kit by Does not apply route 3 (three) times daily. 1 kit 0 Taking  . meloxicam (MOBIC) 15 MG tablet Take 1 tablet (15 mg total) by mouth daily for 30 days. 60 tablet 1 Taking  . varenicline (CHANTIX CONTINUING MONTH PAK) 1 MG tablet Take 1 tablet (1 mg total) by mouth 2 (two) times daily. 60 tablet 1 Taking  . varenicline (CHANTIX STARTING MONTH PAK) 0.5 MG X 11 & 1 MG X 42 tablet TAKE AS DIRECTED 53 tablet 0 Taking    Current Facility-Administered Medications  Medication Dose Route Frequency Provider Last Rate Last Dose  . acetaminophen (TYLENOL) tablet 1,000 mg  1,000 mg Oral On Call to OR Michael Boston, MD      . bupivacaine liposome (EXPAREL) 1.3 % injection 266 mg  20 mL Infiltration On Call to OR Michael Boston, MD      . ceFAZolin (ANCEF) IVPB 2g/100 mL premix  2 g Intravenous On Call to OR Michael Boston, MD       And  . metroNIDAZOLE (FLAGYL) IVPB 500 mg  500 mg Intravenous On Call to OR Michael Boston, MD      . celecoxib (CELEBREX) capsule 200 mg  200 mg Oral On Call to OR Michael Boston, MD      . Chlorhexidine Gluconate Cloth 2 % PADS 6 each  6 each Topical Once Michael Boston, MD      . gabapentin (NEURONTIN) capsule 300 mg  300 mg Oral On Call to OR Michael Boston, MD      . lactated ringers infusion   Intravenous Continuous Annye Asa, MD      . scopolamine (TRANSDERM-SCOP) 1 MG/3DAYS 1.5 mg  1 patch Transdermal Q72H Annye Asa, MD         Allergies  Allergen Reactions  . Metformin And Related Diarrhea    BP 125/71   Pulse 76   Temp 98.1 F (36.7 C) (Oral)   Resp 14   LMP 09/07/2017   SpO2 96%   Labs: Results for orders placed or performed during the hospital encounter of 10/12/18 (from the past 48 hour(s))  Glucose, capillary     Status: Abnormal   Collection Time: 10/12/18  9:48 AM  Result Value Ref Range   Glucose-Capillary 250 (H) 70 - 99 mg/dL    Imaging / Studies: Dg Ankle Complete Left  Result Date: 09/18/2018 Please see detailed radiograph report in office note.  Dg Ankle Complete Right  Result Date: 09/18/2018 Please see detailed radiograph report in office note.    [name], M.D., F.A.C.S. Gastrointestinal and Minimally Invasive Surgery [facility] [address], [place], [place] [address] [phone] Main / Paging  10/12/2018 9:59 AM    [name]

## 2018-10-12 NOTE — Discharge Instructions (Signed)
ANORECTAL SURGERY:  °POST OPERATIVE INSTRUCTIONS ° °###################################################################### ° °EAT °Start with a pureed / full liquid diet °After 24 hours, gradually transition to a high fiber diet.   ° °CONTROL PAIN °Control pain so you can tolerate bowel movements,  °walk, sleep, tolerate sneezing/coughing, and go up/down stairs. ° ° °HAVE A BOWEL MOVEMENT DAILY °Keep your bowels regular to avoid problems.   °Taking a fiber supplement every day to keep bowels soft.   °Try a laxative to override constipation. °Use an antidairrheal to slow down diarrhea.   °Call if not better after 2 tries ° °WALK °Walk an hour a day.  Control your pain to do that. °  °CALL IF YOU HAVE PROBLEMS/CONCERNS °Call if you are still struggling despite following these instructions. °Call if you have concerns not answered by these instructions ° °###################################################################### ° ° ° °1. Take your usually prescribed home medications unless otherwise directed. °2. DIET: Follow a light bland diet the first 24 hours after arrival home, such as soup, liquids, crackers, etc.  Be sure to include lots of fluids daily.  Avoid fast food or heavy meals as your are more likely to get nauseated.  Eat a low fat the next few days after surgery.   °3. PAIN CONTROL: °a. Pain is best controlled by a usual combination of three different methods TOGETHER: °i. Ice/Heat °ii. Over the counter pain medication °iii. Prescription pain medication °b. Expect swelling and discomfort in the anus/rectal area.  Warm water baths (30-60 minutes up to 6 times a day, especially after bowel meovements) will help. Use ice for the first few days to help decrease swelling and bruising, then switch to heat such as warm towels, sitz baths, warm baths, etc to help relax tight/sore spots and speed recovery.  Some people prefer to use ice alone, heat alone, alternating between ice & heat.  Experiment to what works  for you.   °c. It is helpful to take an over-the-counter pain medication continuously for the first few weeks.  Choose one of the following that works best for you: °i. Naproxen (Aleve, etc)  Two 220mg tabs twice a day °ii. Ibuprofen (Advil, etc) Three 200mg tabs four times a day (every meal & bedtime) °iii. Acetaminophen (Tylenol, etc) 500-650mg four times a day (every meal & bedtime) °d. A  prescription for pain medication (such as oxycodone, hydrocodone, etc) should be given to you upon discharge.  Take your pain medication as prescribed.  °i. If you are having problems/concerns with the prescription medicine (does not control pain, nausea, vomiting, rash, itching, etc), please call us [phone] to see if we need to switch you to a different pain medicine that will work better for you and/or control your side effect better. °ii. If you need a refill on your pain medication, please contact your pharmacy.  They will contact our office to request authorization. Prescriptions will not be filled after 5 pm or on week-ends.  If can take up to 48 hours for it to be filled & ready so avoid waiting until you are down to thel ast pill. °e. A topical cream (Dibucaine) or a prescription for a cream (such as diltiazem 2% gel) may be given to you.  Many people find relief with topical creams.  Some people find it burns too much.  Experiment.  If it helps, use it.  If it burns, don't using it. ° °Use a Sitz Bath 4-8 times a day for relief ° ° °Sitz Bath °A sitz bath   is a warm water bath taken in the sitting position that covers only the hips and buttocks. It may be used for either healing or hygiene purposes. Sitz baths are also used to relieve pain, itching, or muscle spasms. The water may contain medicine. Moist heat will help you heal and relax.  °HOME CARE INSTRUCTIONS  °Take 3 to 4 sitz baths a day. °1. Fill the bathtub half full with warm water. °2. Sit in the water and open the drain a little. °3. Turn on the warm  water to keep the tub half full. Keep the water running constantly. °4. Soak in the water for 15 to 20 minutes. °5. After the sitz bath, pat the affected area dry first. ° ° °4. KEEP YOUR BOWELS REGULAR °a. The goal is one soft bowel movement a day °b. Avoid getting constipated.  Between the surgery and the pain medications, it is common to experience some constipation.  Increasing fluid intake and taking a fiber supplement (such as Metamucil, Citrucel, FiberCon, MiraLax, etc) 2-3 times a day regularly will usually help prevent this problem from occurring.  A mild laxative (prune juice, Milk of Magnesia, MiraLax, etc) should be taken according to package directions if there are no bowel movements after 48 hours. °c. Watch out for diarrhea.  If you have many loose bowel movements, simplify your diet to bland foods & liquids for a few days.  Stop any stool softeners and decrease your fiber supplement.  Switching to mild anti-diarrheal medications (Kayopectate, Pepto Bismol) can help.  Can try an imodium/loperamide dose.  If this worsens or does not improve, please call us. ° °5. Wound Care ° °a. Remove your bandages with your first bowel movement, usually the day after surgery.  You may have packing if you had an abscess.  Let any packing or gauze fall come out.   °b. Wear an absorbent pad or soft cotton balls in your underwear as needed to catch any drainage and help keep the area  °c. Keep the area clean and dry.  Bathe / shower every day.  Keep the area clean by showering / bathing over the incision / wound.   It is okay to soak an open wound to help wash it.  Consider using a squeeze bottle filled with warm water to gently wash the anal area.  Wet wipes or showers / gentle washing after bowel movements is often less traumatic than regular toilet paper. °d. You will often notice bleeding with bowel movements.  This should slow down by the end of the first week of surgery.  Sitting on an ice pack can  help. °e. Expect some drainage.  This should slow down by the end of the first week of surgery, but you will have occasional bleeding or drainage up to a few months after surgery.  Wear an absorbent pad or soft cotton gauze in your underwear until the drainage stops. ° °6. ACTIVITIES as tolerated:   °a. You may resume regular (light) daily activities beginning the next day--such as daily self-care, walking, climbing stairs--gradually increasing activities as tolerated.  If you can walk 30 minutes without difficulty, it is safe to try more intense activity such as jogging, treadmill, bicycling, low-impact aerobics, swimming, etc. °b. Save the most intensive and strenuous activity for last such as sit-ups, heavy lifting, contact sports, etc  Refrain from any heavy lifting or straining until you are off narcotics for pain control.   °c. DO NOT PUSH THROUGH PAIN.  Let pain   be your guide: If it hurts to do something, don't do it.  Pain is your body warning you to avoid that activity for another week until the pain goes down. d. You may drive when you are no longer taking prescription pain medication, you can comfortably sit for long periods of time, and you can safely maneuver your car and apply brakes. e. Dennis Bast may have sexual intercourse when it is comfortable.  7. FOLLOW UP in our office a. Please call [facility] at [phone] to set up an appointment to see your surgeon in the office for a follow-up appointment approximately 2-3 weeks after your surgery. b. Make sure that you call for this appointment the day you arrive home to ensure a convenient appointment time.  8. IF YOU HAVE DISABILITY OR FAMILY LEAVE FORMS, BRING THEM TO THE OFFICE FOR PROCESSING.  DO NOT GIVE THEM TO YOUR DOCTOR.        WHEN TO CALL us [phone]: 1. Poor pain control 2. Reactions / problems with new medications (rash/itching, nausea, etc)  3. Fever over 101.5 F (38.5 C) 4. Inability to urinate 5. Nausea and/or  vomiting 6. Worsening swelling or bruising 7. Continued bleeding from incision. 8. Increased pain, redness, or drainage from the incision  The clinic staff is available to answer your questions during regular business hours (8:30am-5pm).  Please dont hesitate to call and ask to speak to one of our nurses for clinical concerns.   A surgeon from [facility] is always on call at the hospitals   If you have a medical emergency, go to the nearest emergency room or call 911.    [facility], [place], [place], [place], [place]  [address] ? MAIN: [phone] ? TOLL FREE: [phone] ? FAX [phone] [email or web address]   HEMORRHOIDS   Hemorrhoidal piles are natural clusters of blood vessels that help the rectum and anal canal stretch to hold stool and allow bowel movements.  Most people will develop a flare of hemorrhoids in their lifetime.  When hemorrhoidals are irritated, they can swell, burn, itch, cause pain, and bleed.  Most flares will calm down gradually within a few weeks.  However, once hemorrhoids are created, they tend to flare more easily.  Fortunately, good habits and simple medical treatment usually control hemorrhoids well, and surgery is needed only in severe cases.  TREATMENT OF HEMORRHOID FLARE Warm soaks. 4-8 times a day This helps more than any topical medication.   1. A sitz bath is a warm water bath taken in the sitting position that covers only the hips and buttocks.Fill the bathtub half full with warm water. 2. Soak in the water for 15 to 30 minutes. 3. After the sitz bath, pat the affected area dry first.  Normalize your bowels.  Extremes of diarrhea or constipation will make hemorrhoids worse.  One soft bowel movement a day is the goal.   Wet wipes instead of toilet paper Pain control with a NSAID such as ibuprofen (Advil) or naproxen (Aleve) or acetaminophen (Tylenol) around the clock.  Narcotics are  constipating and should be minimized if possible Topical creams contain steroids (bydrocortisone) or local anesthetic (xylocaine) can help make pain and itching more tolerable.    TROUBLESHOOTING IRREGULAR BOWELS 1) Avoid extremes of bowel movements (no bad constipation/diarrhea) 2) Miralax 17gm in 8oz. water or juice every day. May use twice a day.  3) Gas-x or Phazyme as needed for gas & bloating.  4) Soft &  bland diet. No spicy, greasy, or fried foods.  5) Omeprazole over-the-counter as needed  6) May hold gluten/wheat products from diet to see if symptoms improve.  7)  May try probiotics (Align, Activa, etc) to help calm the bowels down 7) If symptoms become worse: Call back immediately.

## 2018-10-12 NOTE — Anesthesia Procedure Notes (Signed)
Procedure Name: Intubation Date/Time: 10/12/2018 12:16 PM Performed by: Lind Covert, CRNA Pre-anesthesia Checklist: Patient identified, Emergency Drugs available, Suction available, Patient being monitored and Timeout performed Patient Re-evaluated:Patient Re-evaluated prior to induction Oxygen Delivery Method: Circle system utilized Preoxygenation: Pre-oxygenation with 100% oxygen Induction Type: IV induction Ventilation: Mask ventilation without difficulty Laryngoscope Size: Mac and 3 Grade View: Grade II Tube type: Oral Tube size: 7.0 mm Number of attempts: 1 Airway Equipment and Method: Stylet Placement Confirmation: ETT inserted through vocal cords under direct vision,  positive ETCO2 and breath sounds checked- equal and bilateral Secured at: 22 cm Tube secured with: Tape Dental Injury: Teeth and Oropharynx as per pre-operative assessment

## 2018-10-12 NOTE — Anesthesia Postprocedure Evaluation (Signed)
Anesthesia Post Note  Patient: ELBONY MCCLIMANS  Procedure(s) Performed: LATERAL INTERNAL HEMORRHOID LIGATION/PEXY ANORECTAL EXAM UNDER ANESTHESIA WITH HEMORRHOIDECTOMY X2 (N/A Rectum)     Patient location during evaluation: PACU Anesthesia Type: General Level of consciousness: awake and alert, patient cooperative and oriented Pain management: pain level controlled Vital Signs Assessment: post-procedure vital signs reviewed and stable Respiratory status: spontaneous breathing, nonlabored ventilation and respiratory function stable Cardiovascular status: blood pressure returned to baseline and stable Postop Assessment: no apparent nausea or vomiting, adequate PO intake and able to ambulate Anesthetic complications: no    Last Vitals:  Vitals:   10/12/18 1445 10/12/18 1517  BP: 132/80 134/86  Pulse: 80 79  Resp: 18 16  Temp: 36.6 C 36.5 C  SpO2: 95% 94%    Last Pain:  Vitals:   10/12/18 1445  TempSrc:   PainSc: 4                  Mahrukh Seguin,E. Kailyn Vanderslice

## 2018-10-12 NOTE — Anesthesia Preprocedure Evaluation (Addendum)
Anesthesia Evaluation  Patient identified by MRN, date of birth, ID band Patient awake    Reviewed: Allergy & Precautions, NPO status , Patient's Chart, lab work & pertinent test results  History of Anesthesia Complications Negative for: history of anesthetic complications  Airway Mallampati: II  TM Distance: >3 FB Neck ROM: Full    Dental  (+) Partial Upper, Partial Lower, Missing, Dental Advisory Given   Pulmonary Current Smoker,    breath sounds clear to auscultation       Cardiovascular hypertension, (-) angina Rhythm:Regular Rate:Normal     Neuro/Psych Anxiety Depression negative neurological ROS     GI/Hepatic Neg liver ROS, GERD  Controlled,  Endo/Other  diabetes (glu 250), Insulin Dependent, Oral Hypoglycemic AgentsMorbid obesity  Renal/GU negative Renal ROS     Musculoskeletal   Abdominal (+) + obese,   Peds  Hematology negative hematology ROS (+)   Anesthesia Other Findings   Reproductive/Obstetrics                            Anesthesia Physical Anesthesia Plan  ASA: III  Anesthesia Plan: General   Post-op Pain Management:    Induction: Intravenous  PONV Risk Score and Plan: 3 and Scopolamine patch - Pre-op, Dexamethasone and Ondansetron  Airway Management Planned: Oral ETT  Additional Equipment:   Intra-op Plan:   Post-operative Plan: Extubation in OR  Informed Consent: I have reviewed the patients History and Physical, chart, labs and discussed the procedure including the risks, benefits and alternatives for the proposed anesthesia with the patient or authorized representative who has indicated his/her understanding and acceptance.     Dental advisory given  Plan Discussed with: CRNA and Surgeon  Anesthesia Plan Comments: (Plan routine monitors, GETA)        Anesthesia Quick Evaluation

## 2018-10-12 NOTE — Transfer of Care (Signed)
Immediate Anesthesia Transfer of Care Note  Patient: Lisa Norton  Procedure(s) Performed: LATERAL INTERNAL HEMORRHOID LIGATION/PEXY ANORECTAL EXAM UNDER ANESTHESIA WITH HEMORRHOIDECTOMY X2 (N/A Rectum)  Patient Location: PACU  Anesthesia Type:General  Level of Consciousness: sedated  Airway & Oxygen Therapy: Patient Spontanous Breathing and Patient connected to face mask oxygen  Post-op Assessment: Report given to RN and Post -op Vital signs reviewed and stable  Post vital signs: Reviewed and stable  Last Vitals:  Vitals Value Taken Time  BP    Temp    Pulse 80 10/12/2018  1:57 PM  Resp 22 10/12/2018  1:57 PM  SpO2 100 % 10/12/2018  1:57 PM  Vitals shown include unvalidated device data.  Last Pain:  Vitals:   10/12/18 0949  TempSrc: Oral      Patients Stated Pain Goal: 4 (30/10/40 4591)  Complications: No apparent anesthesia complications

## 2018-10-12 NOTE — Op Note (Signed)
10/12/2018  1:40 PM  PATIENT:  Lisa Norton  48 y.o. 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  Delay start of Pharmacological VTE agent (>24hrs) due to surgical blood loss or risk of bleeding:  no  DRAINS: none   SPECIMEN: Left lateral and right posterior internal/external hemorrhoid piles  DISPOSITION OF SPECIMEN:  PATHOLOGY  COUNTS:  YES  PLAN OF CARE: Discharge to home after PACU  PATIENT DISPOSITION:  PACU - hemodynamically stable.  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  Some chronic internal/external hemorrhoidal disease as well I recommended examination and surgical treatment:  The anatomy & physiology of the anorectal region was discussed.  The pathophysiology of anal fissure and differential diagnosis was discussed.  Natural history progression  was discussed.   I stressed the importance of a bowel regimen to have daily soft bowel movements to minimize progression of disease.     The patient's condition is not adequately controlled.  Non-operative treatment has not healed the fissure.  Therefore, I recommended  examination under anesthesia for better examination to confirm the diagnosis and treat by lateral internal sphincterotomy to relax the spasm better & allow the fissure to heal.  Technique, benefits, alternatives were discussed.   I noted a good likelihood this will help address the problem.  Risks such as bleeding, pain, incontinence, recurrence, heart attack, death, and other risks were discussed.    Educational handouts further explaining the pathology, treatment options, and bowel regimen were given as well.  The patient expressed understanding & wishes to proceed with surgery.  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  Sphincterotomy location:  Left lateral anal canal.  60% distal internal sphincterotomy performed  Left lateral grade 3 internal hemorrhoid.  Right posterior grade 4 with external component.  Right anterior grade 2.  Hemorrhoidal ligation/pexy done of all hemorrhoidal columns x6.  Right posterior greater than left lateral hemorrhoidectomies done   DESCRIPTION:   Informed consent was confirmed. Patient underwent general anesthesia without difficulty. Patient was placed into prone positioning.  The perianal region was prepped and draped in sterile fashion. Surgical timeout confirmed or plan.  I did digital rectal examination and then transitioned over to anoscopy to get a sense of the anatomy.  I identified an anal fissure in the posterior midline anal canal.  Rather chronic.  After the patient was asleep her sphincter tone was more relaxed but her sphincter complex was still rather hypertensive.  No stricture.  No abscess located.  No fistula.  Enlarged hemorrhoidal piles as noted above in OR findings.  I went ahead and proceeded with internal sphinterotmy technique.  I took a 2-0 Vicryl and did a figure-of-eight ligation at  the left lateral rectum about 5 cm proximal to the anal verge.  I then ran that suture longitudinally  with intermittent stitching.  I then made a longitudinal incision through the anoderm of the left lateral anal canal longitudinally.  I identified the internal and external sphincters.  Elevated the internal sphincter.  I proceeded with a partial internal sphincterotomy starting distally and moving proximally using cautery.  This involved the distal 60% thickness.  This provided improved relaxation of the anal sphincter. I excised enlarged hemorrhoidal piles primarily the vascular complex trying to spare anoderm until there is a much more normal left vertebral hemorrhoidal pile.  I closed the hemorrhoidectomy and anal canal wound vertically by continuing to run the 2-0 Vicryl suture down.    At the proceeded to do hemorrhoidal ligation and pexy.  2-0 Vicryl sutures done in a classic hexagonal pattern.  Again starting 5 cm proximal to the anal sphincters and running the stitch longitudinally such that right anterior/lateral/posterior and left anterior/lateral/posterior hemorrhoidal column ligation and pexy has had been done.  Patient did have a chronically enlarged right posterior grade 4 internal hemorrhoid with external component.  I excised redundant tissue out of that.  The right anterior hemorrhoid pile while in large was only grade 2 and inside to just ligated and pexied that as opposed to doing a third hemorrhoidectomy.  Of note I did run the sutures and tied them down with a Parks retractor or large Hill-Ferguson retractor in place to avoid any narrowing.  At completion of this hemorrhoidal anatomy was much more normal.  There was no narrowing or stricturing.  Redundant hemorrhoidal tissue had been ligated or excised so the anatomy was much more normal..  Hemostasis was good.  I excised some excess external hemorrhoidal tissue in the left lateral and right posterior piles until it was much more normal.  I closed those wounds with 2-0 chromic horizontal mattress interrupted sutures over a large  Hill-Ferguson retractor.  I reexamined the anal canal.   There is was no narrowing.  Hemostasis was excellent.  I repeated anoscopy and examination.  Hemostasis was good.  Patient is being extubated go to recovery room.  I discussed operative findings, updated the patient's status, discussed probable steps to recovery, and gave postoperative recommendations to the patient's significant other.  Recommendations were made.  Questions were answered.  He expressed understanding & appreciation.     [name], M.D., F.A.C.S. Gastrointestinal and Minimally Invasive Surgery [facility] [address], [place], [place] [address] [phone] Main / Paging

## 2018-10-13 ENCOUNTER — Encounter (HOSPITAL_COMMUNITY): Payer: Self-pay | Admitting: Surgery

## 2018-10-16 ENCOUNTER — Ambulatory Visit: Payer: Medicaid Other | Admitting: Podiatry

## 2018-10-18 ENCOUNTER — Other Ambulatory Visit: Payer: Self-pay | Admitting: Physician Assistant

## 2018-10-18 ENCOUNTER — Ambulatory Visit: Payer: Medicaid Other | Admitting: Podiatry

## 2018-10-18 ENCOUNTER — Encounter: Payer: Self-pay | Admitting: Podiatry

## 2018-10-18 DIAGNOSIS — E785 Hyperlipidemia, unspecified: Secondary | ICD-10-CM

## 2018-10-18 DIAGNOSIS — M7661 Achilles tendinitis, right leg: Secondary | ICD-10-CM | POA: Diagnosis not present

## 2018-10-18 DIAGNOSIS — M7731 Calcaneal spur, right foot: Secondary | ICD-10-CM | POA: Diagnosis not present

## 2018-10-18 DIAGNOSIS — M722 Plantar fascial fibromatosis: Secondary | ICD-10-CM | POA: Diagnosis not present

## 2018-10-18 NOTE — Progress Notes (Signed)
   HPI: 48 year old female presents the office today for follow-up evaluation regarding bilateral foot pain.  Patient underwent ankle arthroscopy with endoscopic plantar fasciotomy on 09/29/2017 for the right lower extremity.  Patient continues to have right ankle pain and plantar heel pain.  She also complains of posterior heel pain that is been going on for several months.  Conservative modalities of been unsuccessful including anti-inflammatory injections, oral anti-inflammatories, shoe gear modification, physical therapy, immobilization, and she is concerned that the underlying pain is due to calcaneal heel spurs both plantar and posterior.  She presents today for further treatment evaluation  Past Medical History:  Diagnosis Date  . Anxiety   . COLD SORE 05/15/2010  . DEPRESSION 12/12/2009  . Diabetes mellitus without complication ([place])   . High cholesterol   . 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 HYPERTENSION 12/12/2009  . TOBACCO ABUSE 12/12/2009     Physical Exam: General: The patient is alert and oriented x3 in no acute distress.  Dermatology: Skin is warm, dry and supple bilateral lower extremities. Negative for open lesions or macerations.  Vascular: Palpable pedal pulses bilaterally. No edema or erythema noted. Capillary refill within normal limits.  Neurological: Epicritic and protective threshold grossly intact bilaterally.   Musculoskeletal Exam: Range of motion within normal limits to all pedal and ankle joints bilateral. Muscle strength 5/5 in all groups bilateral.  Significant amount of pain on palpation noted to the plantar heel is worse as well as the posterior heel at the insertion of the Achilles tendon.  There is a prominence to the posterior heel which correlates to the posterior heel spur noted on radiographic exam.  Regarding the left lower extremity patient states that the right lower extremity is significantly more symptomatic than the left at the moment.  Radiographic Exam  taken 09/18/2018:  Normal osseous mineralization. Joint spaces preserved.  There is a very prominent posterior heel spur noted on lateral view as well as a plantar heel spur noted to the right lower extremity.  This correlates with musculoskeletal exam and pain on palpation to this area.  Assessment: 1.  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 2.  Plantar heel spur right 3.  H/o ankle arthroscopy right DOS: 09/29/2017 4.  H/o EPF right. DOS: 09/29/2017   Plan of Care:  1. Patient evaluated. X-Rays reviewed that were taken on 09/18/2018.  2.  Today we discussed the calcaneal spurs both plantar and posterior.  We discussed different treatment modalities both conservatively and surgically.  The patient would like to pursue surgical intervention at this time. All possible complications and details of the procedure were explained.  No guarantees were expressed or implied.  All patient questions answered. 3.  Authorization for surgery initiated today. Surgery would consist of retrocalcaneal exostectomy with repair of Achilles tendon right.  Plantar heel spur resection right.   4.  Return to clinic 1 week postop      Edrick Kins, DPM [facility]  Dr. Edrick Kins, DPM    [address], [place] [address]                Office [phone]  Fax [phone]

## 2018-10-18 NOTE — Patient Instructions (Signed)
Pre-Operative Instructions  Congratulations, you have decided to take an important step towards improving your quality of life.  You can be assured that the doctors and staff at [facility] will be with you every step of the way.  Here are some important things you should know:  1. Plan to be at the surgery center/hospital at least 1 (one) hour prior to your scheduled time, unless otherwise directed by the surgical center/hospital staff.  You must have a responsible adult accompany you, remain during the surgery and drive you home.  Make sure you have directions to the surgical center/hospital to ensure you arrive on time. 2. If you are having surgery at [facility] or [facility] hospitals, you will need a copy of your medical history and physical form from your family physician within one month prior to the date of surgery. We will give you a form for your primary physician to complete.  3. We make every effort to accommodate the date you request for surgery.  However, there are times where surgery dates or times have to be moved.  We will contact you as soon as possible if a change in schedule is required.   4. No aspirin/ibuprofen for one week before surgery.  If you are on aspirin, any non-steroidal anti-inflammatory medications (Mobic, Aleve, Ibuprofen) should not be taken seven (7) days prior to your surgery.  You make take Tylenol for pain prior to surgery.  5. Medications - If you are taking daily heart and blood pressure medications, seizure, reflux, allergy, asthma, anxiety, pain or diabetes medications, make sure you notify the surgery center/hospital before the day of surgery so they can tell you which medications you should take or avoid the day of surgery. 6. No food or drink after midnight the night before surgery unless directed otherwise by surgical center/hospital staff. 7. No alcoholic beverages 24-hours prior to surgery.  No smoking 24-hours prior or 24-hours after  surgery. 8. Wear loose pants or shorts. They should be loose enough to fit over bandages, boots, and casts. 9. Don't wear slip-on shoes. Sneakers are preferred. 10. Bring your boot with you to the surgery center/hospital.  Also bring crutches or a walker if your physician has prescribed it for you.  If you do not have this equipment, it will be provided for you after surgery. 11. If you have not been contacted by the surgery center/hospital by the day before your surgery, call to confirm the date and time of your surgery. 12. Leave-time from work may vary depending on the type of surgery you have.  Appropriate arrangements should be made prior to surgery with your employer. 13. Prescriptions will be provided immediately following surgery by your doctor.  Fill these as soon as possible after surgery and take the medication as directed. Pain medications will not be refilled on weekends and must be approved by the doctor. 14. Remove nail polish on the operative foot and avoid getting pedicures prior to surgery. 15. Wash the night before surgery.  The night before surgery wash the foot and leg well with water and the antibacterial soap provided. Be sure to pay special attention to beneath the toenails and in between the toes.  Wash for at least three (3) minutes. Rinse thoroughly with water and dry well with a towel.  Perform this wash unless told not to do so by your physician.  Enclosed: 1 Ice pack (please put in freezer the night before surgery)   1 Hibiclens skin cleaner     Pre-op instructions  If you have any questions regarding the instructions, please do not hesitate to call our office.  [place]: [address], [place], [place] [address] -- [phone]  [place]: [address] [place], [place] [address] -- [phone]  [place]: [address]  [place], [place] [address] -- [phone]  [place]: [address], [address], [place], [place] [address] -- [phone]  Website:  [email or web address] 

## 2018-10-19 ENCOUNTER — Other Ambulatory Visit: Payer: Self-pay | Admitting: Family Medicine

## 2018-10-19 DIAGNOSIS — E785 Hyperlipidemia, unspecified: Secondary | ICD-10-CM

## 2018-10-20 NOTE — Telephone Encounter (Signed)
Last lipid 11/02/17

## 2018-10-30 ENCOUNTER — Encounter: Payer: Self-pay | Admitting: Family Medicine

## 2018-10-30 ENCOUNTER — Ambulatory Visit: Payer: Medicaid Other | Admitting: Family Medicine

## 2018-10-30 VITALS — BP 167/83 | HR 95 | Temp 97.5°F | Ht 61.0 in | Wt 220.0 lb

## 2018-10-30 DIAGNOSIS — T7840XA Allergy, unspecified, initial encounter: Secondary | ICD-10-CM | POA: Diagnosis not present

## 2018-10-30 DIAGNOSIS — R21 Rash and other nonspecific skin eruption: Secondary | ICD-10-CM | POA: Diagnosis not present

## 2018-10-30 MED ORDER — METHYLPREDNISOLONE ACETATE 80 MG/ML IJ SUSP
80.0000 mg | Freq: Once | INTRAMUSCULAR | Status: AC
  Administered 2018-10-30: 80 mg via INTRAMUSCULAR

## 2018-10-30 MED ORDER — PREDNISONE 20 MG PO TABS: ORAL_TABLET | ORAL | 0 refills | Status: DC

## 2018-10-30 MED ORDER — HYDROXYZINE PAMOATE 100 MG PO CAPS: 100.0000 mg | ORAL_CAPSULE | Freq: Three times a day (TID) | ORAL | 0 refills | Status: DC | PRN

## 2018-10-30 NOTE — Progress Notes (Signed)
Subjective:  Patient ID: Lisa Norton, female    DOB: 1970/10/22, 48 y.o.   MRN: [id number]  Chief Complaint:  Itching all over   HPI: Lisa Norton is a 48 y.o. female presenting on 10/30/2018 for Itching all over   1. Allergic reaction, initial encounter   Pt presents today with complaints of a pruritic rash to legs, arms, and torso. Pt states this started several days after starting Hydrocodone. She denies throat swelling, cough, voice changes, or shortness of breath. No chest pain or palpitations. Pt states she has not changed anything else. No new hygiene or household products. No other new medications.    Relevant past medical, surgical, family, and social history reviewed and updated as indicated.  Allergies and medications reviewed and updated.   Past Medical History:  Diagnosis Date  . Anxiety   . COLD SORE 05/15/2010  . DEPRESSION 12/12/2009  . Diabetes mellitus without complication ([place])   . High cholesterol   . 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 HYPERTENSION 12/12/2009  . TOBACCO ABUSE 12/12/2009    Past Surgical History:  Procedure Laterality Date  . BLADDER SUSPENSION    . CERVICAL FUSION    . CESAREAN SECTION WITH BILATERAL TUBAL LIGATION Bilateral 02/12/2015   Procedure: CESAREAN SECTION;  Surgeon: Florian Buff, MD;  Location: [facility];  Service: Obstetrics;  Laterality: Bilateral;  Fetal Demise  . CHOLECYSTECTOMY  2006  . EVALUATION UNDER ANESTHESIA WITH HEMORRHOIDECTOMY N/A 10/12/2018   Procedure: LATERAL INTERNAL HEMORRHOID LIGATION/PEXY ANORECTAL EXAM UNDER ANESTHESIA WITH HEMORRHOIDECTOMY X2;  Surgeon: Michael Boston, MD;  Location: [facility];  Service: General;  Laterality: N/A;  . FOOT SURGERY Right    Plantar, bone spurs  . LAPAROSCOPIC VAGINAL HYSTERECTOMY WITH SALPINGECTOMY  02/16/2018   [facility]  . TONSILLECTOMY  1988    Social History   Socioeconomic History  . Marital status: Legally Separated    Spouse name: Not on file    . Number of children: 2  . Years of education: Not on file  . Highest education level: Not on file  Occupational History  . Not on file  Social Needs  . Financial resource strain: Not on file  . Food insecurity:    Worry: Not on file    Inability: Not on file  . Transportation needs:    Medical: Not on file    Non-medical: Not on file  Tobacco Use  . Smoking status: Current Every Day Smoker    Packs/day: 1.50    Years: 20.00    Pack years: 30.00    Types: Cigarettes  . Smokeless tobacco: Former Systems developer    Types: Snuff    Quit date: 05/05/1986  Substance and Sexual Activity  . Alcohol use: No  . Drug use: No  . Sexual activity: Not Currently    Birth control/protection: None    Comment: pt states she did not have a tubal  Lifestyle  . Physical activity:    Days per week: Not on file    Minutes per session: Not on file  . Stress: Not on file  Relationships  . Social connections:    Talks on phone: Not on file    Gets together: Not on file    Attends religious service: Not on file    Active member of club or organization: Not on file    Attends meetings of clubs or organizations: Not on file    Relationship status: Not on file  .  Intimate partner violence:    Fear of current or ex partner: Not on file    Emotionally abused: Not on file    Physically abused: Not on file    Forced sexual activity: Not on file  Other Topics Concern  . Not on file  Social History Narrative  . Not on file    Outpatient Encounter Medications as of 10/30/2018  Medication Sig  . cyclobenzaprine (FLEXERIL) 10 MG tablet Take 1 tablet (10 mg total) by mouth 3 (three) times daily as needed for muscle spasms.  . dapagliflozin propanediol (FARXIGA) 10 MG TABS tablet TAKE ONE (1) TABLET EACH DAY  . gabapentin (NEURONTIN) 100 MG capsule TAKE ONE TABLET BY MOUTH 3 TIMES DAILY (Patient taking differently: Take 100 mg by mouth 3 (three) times daily as needed (for pain). )  . gabapentin (NEURONTIN) 300  MG capsule Take 1 capsule (300 mg total) by mouth 2 (two) times daily. Increase to 4x/day as needed  . insulin detemir (LEVEMIR) 100 UNIT/ML injection Inject 0.2 mLs (20 Units total) into the skin at bedtime.  Marland Kitchen linaclotide (LINZESS) 72 MCG capsule Take 72 mcg by mouth daily before breakfast.  . liraglutide (VICTOZA) 18 MG/3ML SOPN Inject 0.2 mLs (1.2 mg total) into the skin at bedtime.  Marland Kitchen loratadine (CLARITIN) 10 MG tablet Take 10 mg by mouth daily.  . magnesium hydroxide (MILK OF MAGNESIA) 400 MG/5ML suspension Take 5-10 mLs by mouth daily as needed for mild constipation.  . polyethylene glycol powder (GLYCOLAX/MIRALAX) powder Take 17 g by mouth 2 (two) times daily as needed. (Patient taking differently: Take 17 g by mouth 2 (two) times daily as needed for moderate constipation. )  . pravastatin (PRAVACHOL) 80 MG tablet TAKE ONE (1) TABLET EACH DAY  . ranitidine (ZANTAC) 150 MG tablet Take 1 tablet (150 mg total) by mouth daily.  . sertraline (ZOLOFT) 50 MG tablet Take 1 tablet (50 mg total) by mouth daily. (Patient taking differently: Take 50 mg by mouth at bedtime. )  . varenicline (CHANTIX STARTING MONTH PAK) 0.5 MG X 11 & 1 MG X 42 tablet TAKE AS DIRECTED  . [DISCONTINUED] oxyCODONE (OXY IR/ROXICODONE) 5 MG immediate release tablet Take 1-2 tablets (5-10 mg total) by mouth every 6 (six) hours as needed for moderate pain, severe pain or breakthrough pain.  . hydrOXYzine (VISTARIL) 100 MG capsule Take 1 capsule (100 mg total) by mouth 3 (three) times daily as needed for itching.  . predniSONE (DELTASONE) 20 MG tablet 2 po at sametime daily for 5 days- start tomorrow  . [EXPIRED] methylPREDNISolone acetate (DEPO-MEDROL) injection 80 mg    No facility-administered encounter medications on file as of 10/30/2018.     Allergies  Allergen Reactions  . Metformin And Related Diarrhea  . Hydrocodone Itching and Rash    Review of Systems  HENT: Negative for sore throat, trouble swallowing and voice  change.   Respiratory: Negative for cough, choking, chest tightness, shortness of breath, wheezing and stridor.   Cardiovascular: Negative for chest pain, palpitations and leg swelling.  Skin: Positive for rash.  Neurological: Negative for dizziness, syncope, light-headedness and headaches.  Psychiatric/Behavioral: Negative for confusion.  All other systems reviewed and are negative.       Objective:  BP (!) 167/83   Pulse 95   Temp (!) 97.5 F (36.4 C) (Oral)   Ht 5\' 1"  (1.549 m)   Wt 220 lb (99.8 kg)   LMP 09/07/2017   BMI 41.57 kg/m    Wt  Readings from Last 3 Encounters:  10/30/18 220 lb (99.8 kg)  10/12/18 223 lb (101.2 kg)  10/05/18 223 lb (101.2 kg)    Physical Exam Vitals signs and nursing note reviewed.  Constitutional:      General: She is in acute distress (moderate).     Appearance: Normal appearance.  HENT:     Head: Normocephalic and atraumatic.     Jaw: There is normal jaw occlusion.     Nose: Nose normal. No congestion.     Mouth/Throat:     Lips: Pink.     Mouth: Mucous membranes are moist. No angioedema.     Pharynx: Oropharynx is clear. No pharyngeal swelling, oropharyngeal exudate, posterior oropharyngeal erythema or uvula swelling.     Tonsils: No tonsillar exudate or tonsillar abscesses.  Eyes:     Conjunctiva/sclera: Conjunctivae normal.     Pupils: Pupils are equal, round, and reactive to light.  Neck:     Musculoskeletal: Normal range of motion and neck supple.     Trachea: Trachea and phonation normal.  Cardiovascular:     Rate and Rhythm: Normal rate and regular rhythm.     Heart sounds: Normal heart sounds. No murmur. No friction rub. No gallop.   Pulmonary:     Effort: Pulmonary effort is normal. No respiratory distress.     Breath sounds: Normal breath sounds. No wheezing, rhonchi or rales.  Skin:    General: Skin is warm and dry.     Capillary Refill: Capillary refill takes less than 2 seconds.     Coloration: Skin is not  cyanotic.     Findings: Rash present. Rash is urticarial.          Comments: Urticarial rash  Neurological:     General: No focal deficit present.     Mental Status: She is alert and oriented to person, place, and time.  Psychiatric:        Mood and Affect: Mood normal.        Behavior: Behavior normal. Behavior is cooperative.        Thought Content: Thought content normal.        Judgment: Judgment normal.     Results for orders placed or performed during the hospital encounter of 10/12/18  Glucose, capillary  Result Value Ref Range   Glucose-Capillary 250 (H) 70 - 99 mg/dL  Glucose, capillary  Result Value Ref Range   Glucose-Capillary 120 (H) 70 - 99 mg/dL       Pertinent labs & imaging results that were available during my care of the patient were reviewed by me and considered in my medical decision making.  Assessment & Plan:  Lisa Norton was seen today for itching all over.  Diagnoses and all orders for this visit:  Allergic reaction, initial encounter Stop hydrocodone. Check blood sugars frequently while taking steroids. Can take benadryl at night. Vistaril during the day, sedation precautions. Continue Zantac and Zyrtec. Report any new or worsening symptoms.  -     methylPREDNISolone acetate (DEPO-MEDROL) injection 80 mg -     predniSONE (DELTASONE) 20 MG tablet; 2 po at sametime daily for 5 days- start tomorrow -     hydrOXYzine (VISTARIL) 100 MG capsule; Take 1 capsule (100 mg total) by mouth 3 (three) times daily as needed for itching.     Continue all other maintenance medications.  Follow up plan: Return in about 1 week (around 11/06/2018), or if symptoms worsen or fail to improve.  Educational handout given for  allergic reaction  The above assessment and management plan was discussed with the patient. The patient verbalized understanding of and has agreed to the management plan. Patient is aware to call the clinic if symptoms persist or worsen. Patient is aware  when to return to the clinic for a follow-up visit. Patient educated on when it is appropriate to go to the emergency department.   Monia Pouch, FNP-C [facility] [phone]

## 2018-10-30 NOTE — Patient Instructions (Addendum)
Stop hydrocodone.   Allergies, Adult An allergy means that your body reacts to something that bothers it (allergen). It is not a normal reaction. This can happen from something that you:  Eat.  Breathe in.  Touch. You can have an allergy (be allergic) to:  Outdoor things, like: ? Pollen. ? Grass. ? Weeds.  Indoor things, like: ? Dust. ? Smoke. ? Pet dander.  Foods.  Medicines.  Things that bother your skin, like: ? Detergents. ? Chemicals. ? Latex.  Perfume.  Bugs. An allergy cannot spread from person to person (is not contagious). Follow these instructions at home:         Stay away from things that you know you are allergic to.  If you have allergies to things in the air, wash out your nose each day. Do it with one of these: ? A salt-water (saline) spray. ? A container (neti pot).  Take over-the-counter and prescription medicines only as told by your doctor.  Keep all follow-up visits as told by your doctor. This is important.  If you are at risk for a very bad allergy reaction (anaphylaxis), keep an auto-injector with you all the time. This is called an epinephrine injection. ? This is pre-measured medicine with a needle. You can put it into your skin by yourself. ? Right after you have a very bad allergy reaction, you or a person with you must give the medicine in less than a few minutes. This is an emergency.  If you have ever had a very bad allergy reaction, wear a medical alert bracelet or necklace. Your very bad allergy should be written on it. Contact a health care provider if:  Your symptoms do not get better with treatment. Get help right away if:  You have symptoms of a very bad allergy reaction. These include: ? A swollen mouth, tongue, or throat. ? Pain or tightness in your chest. ? Trouble breathing. ? Being short of breath. ? Dizziness. ? Fainting. ? Very bad pain in your belly (abdomen). ? Throwing up (vomiting). ? Watery poop  (diarrhea). Summary  An allergy means that your body reacts to something that bothers it (allergen). It is not a normal reaction.  Stay away from things that make your body react.  Take over-the-counter and prescription medicines only as told by your doctor.  If you are at risk for a very bad allergy reaction, carry an auto-injector (epinephrine injection) all the time. Also, wear a medical alert bracelet or necklace so people know about your allergy. This information is not intended to replace advice given to you by your health care provider. Make sure you discuss any questions you have with your health care provider. Document Released: 12/11/2012 Document Revised: 11/29/2016 Document Reviewed: 11/29/2016 Elsevier Interactive Patient Education  2019 [employer].

## 2018-11-01 ENCOUNTER — Telehealth (INDEPENDENT_AMBULATORY_CARE_PROVIDER_SITE_OTHER): Payer: Self-pay | Admitting: Orthopaedic Surgery

## 2018-11-01 DIAGNOSIS — M25552 Pain in left hip: Secondary | ICD-10-CM

## 2018-11-01 NOTE — Telephone Encounter (Signed)
IC pt and advised her to contact [facility] to find out about her appt. Pt was given [facility] number and she is calling them today

## 2018-11-01 NOTE — Telephone Encounter (Signed)
I called and spoke with patient. MRI was denied by patient's insurance x 2. Per Dr. Lorin Mercy, order PT and have the patient come back to see him and he will request another MRI if no relief from PT. Patient understands. She is also filing for disability.  She would prefer to go to [facility] in [place]. I have entered referral and will give all information to Lorriane Shire when I am in the [place] office tomorrow to call patient and schedule PT.

## 2018-11-01 NOTE — Telephone Encounter (Signed)
Patient called asked for a call back concerning her being scheduled for an MRI. Patient was to be scheduled for an MRI at [facility]. I could not find a phone number to give patient to contact [facility]. The number to contact patient is [phone]

## 2018-11-02 ENCOUNTER — Encounter: Payer: Self-pay | Admitting: Pediatrics

## 2018-11-02 ENCOUNTER — Telehealth: Payer: Self-pay | Admitting: Family Medicine

## 2018-11-02 NOTE — Telephone Encounter (Signed)
Spoke with pt, she will make an appointment to be reevaluated.

## 2018-11-02 NOTE — Telephone Encounter (Signed)
Script written and given to Dominica with [facility] in [place]. She will call patient to schedule.

## 2018-11-12 ENCOUNTER — Other Ambulatory Visit: Payer: Self-pay | Admitting: Pediatrics

## 2018-11-13 ENCOUNTER — Encounter: Payer: Self-pay | Admitting: Family Medicine

## 2018-11-13 ENCOUNTER — Ambulatory Visit: Payer: Medicaid Other | Admitting: Family Medicine

## 2018-11-13 ENCOUNTER — Other Ambulatory Visit: Payer: Self-pay

## 2018-11-13 VITALS — BP 127/81 | HR 81 | Temp 97.2°F | Ht 61.0 in | Wt 219.0 lb

## 2018-11-13 DIAGNOSIS — T7840XD Allergy, unspecified, subsequent encounter: Secondary | ICD-10-CM | POA: Diagnosis not present

## 2018-11-13 DIAGNOSIS — L509 Urticaria, unspecified: Secondary | ICD-10-CM

## 2018-11-13 LAB — CBC WITH DIFFERENTIAL/PLATELET
BASOS: 1 %
Basophils Absolute: 0 10*3/uL (ref 0.0–0.2)
EOS (ABSOLUTE): 0.1 10*3/uL (ref 0.0–0.4)
Eos: 2 %
Hematocrit: 42.6 % (ref 34.0–46.6)
Hemoglobin: 15 g/dL (ref 11.1–15.9)
Immature Grans (Abs): 0 10*3/uL (ref 0.0–0.1)
Immature Granulocytes: 0 %
Lymphocytes Absolute: 2.5 10*3/uL (ref 0.7–3.1)
Lymphs: 35 %
MCH: 33.2 pg — AB (ref 26.6–33.0)
MCHC: 35.2 g/dL (ref 31.5–35.7)
MCV: 94 fL (ref 79–97)
Monocytes Absolute: 0.5 10*3/uL (ref 0.1–0.9)
Monocytes: 7 %
NEUTROS ABS: 3.9 10*3/uL (ref 1.4–7.0)
NEUTROS PCT: 55 %
Platelets: 196 10*3/uL (ref 150–450)
RBC: 4.52 x10E6/uL (ref 3.77–5.28)
RDW: 12.3 % (ref 11.7–15.4)
WBC: 7.2 10*3/uL (ref 3.4–10.8)

## 2018-11-13 MED ORDER — PREDNISONE 10 MG (21) PO TBPK: ORAL_TABLET | ORAL | 0 refills | Status: DC

## 2018-11-13 MED ORDER — CETIRIZINE HCL 10 MG PO TABS: 10.0000 mg | ORAL_TABLET | Freq: Every day | ORAL | 11 refills | Status: DC

## 2018-11-13 MED ORDER — HYDROXYZINE PAMOATE 100 MG PO CAPS: 100.0000 mg | ORAL_CAPSULE | Freq: Three times a day (TID) | ORAL | 0 refills | Status: DC | PRN

## 2018-11-13 MED ORDER — METHYLPREDNISOLONE ACETATE 40 MG/ML IJ SUSP
40.0000 mg | Freq: Once | INTRAMUSCULAR | Status: AC
  Administered 2018-11-13: 40 mg via INTRAMUSCULAR

## 2018-11-13 NOTE — Patient Instructions (Signed)
You were given a dose of steroid here in office.  Start the Prednisone tomorrow to prevent rebound rash.  I have sent in Zyrtec to take every night at bedtime (can cause sleepiness).  This will cover the allergy response for 24 hours but it is ok for you to use the hydroxyzine in between if needed for breakthrough itching.  Follow up with Sharyn Lull in 10 days for recheck.  If still persistent, would consider that blood test and possible referral to dermatology or allergy.  Basic Skin Care Your skin plays an important role in keeping the entire body healthy.  Below are some tips on how to try and maximize skin health from the outside in.  1) Bathe in mildly warm water every 1 to 3 days, followed by light drying and an application of a thick moisturizer cream or ointment, preferably one that comes in a tub. a. Fragrance free moisturizing bars or body washes are preferred such as Purpose, Cetaphil, Dove sensitive skin, Aveeno, [employer] or Vanicream products. b. Use a fragrance free cream or ointment, not a lotion, such as plain petroleum jelly or Vaseline ointment, Aquaphor, Vanicream, Eucerin cream or a generic version, CeraVe Cream, Cetaphil Restoraderm, Aveeno Eczema Therapy and [employer], among others. c. Children with very dry skin often need to put on these creams two, three or four times a day.  As much as possible, use these creams enough to keep the skin from looking dry. d. Consider using fragrance free/dye free detergent, such as Arm and Hammer for sensitive skin, Tide Free or All Free.   2) If I am prescribing a medication to go on the skin, the medicine goes on first to the areas that need it, followed by a thick cream as above to the entire body.  3) Nancy Fetter is a major cause of damage to the skin. a. I recommend sun protection for all of my patients. I prefer physical barriers such as hats with wide brims that cover the ears, long sleeve clothing with SPF protection  including rash guards for swimming. These can be found seasonally at outdoor clothing companies, [employer] and [employer] and online at Parker Hann[employer]in.com, www.uvskinz.com and PlayDetails.hu. Avoid peak sun between the hours of 10am to 3pm to minimize sun exposure.  b. I recommend sunscreen for all of my patients older than 64 months of age when in the sun, preferably with broad spectrum coverage and SPF 30 or higher.  i. For children, I recommend sunscreens that only contain titanium dioxide and/or zinc oxide in the active ingredients. These do not burn the eyes and appear to be safer than chemical sunscreens. These sunscreens include zinc oxide paste found in the diaper section, Vanicream Broad Spectrum 50+, Aveeno Natural Mineral Protection, Neutrogena Pure and Free Baby, Johnson and [employer] Daily face and body lotion, Bed Bath & Beyond, among others. ii. There is no such thing as waterproof sunscreen. All sunscreens should be reapplied after 60-80 minutes of wear.  iii. Spray on sunscreens often use chemical sunscreens which do protect against the sun. However, these can be difficult to apply correctly, especially if wind is present, and can be more likely to irritate the skin.  Long term effects of chemical sunscreens are also not fully known.

## 2018-11-13 NOTE — Progress Notes (Signed)
Subjective: CC: rash PCP: Baruch Gouty, FNP Lisa Norton is a 48 y.o. female presenting to clinic today for:  1. Rash Patient was seen on 10/30/2022 rash that was thought to be secondary to use of Norco.  She was treated with Atarax and a 5-day course of steroids.  She follows up today and notes that the rash resolved on the day of the steroid shot.  She never started the prednisone because it would raise her sugar.  She has been using the atarax but does not feel it is very effective anymore.  Additionally, she has been using calamine lotion on her rash.  She is not moisturizing with anything specific.  She reports that she recently had switched to a gain detergent containing frequents but has since discontinued use of that and switch back to her old gain detergent.  No other known exposures including tick bites, new lotions, soaps, foods, recent travel.  She wonders if she "has a blood infection from her surgery".  No fevers.   ROS: Per HPI  Allergies  Allergen Reactions  . Metformin And Related Diarrhea  . Hydrocodone Itching and Rash   Past Medical History:  Diagnosis Date  . Anxiety   . COLD SORE 05/15/2010  . DEPRESSION 12/12/2009  . Diabetes mellitus without complication ([place])   . High cholesterol   . 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 HYPERTENSION 12/12/2009  . TOBACCO ABUSE 12/12/2009    Current Outpatient Medications:  .  cyclobenzaprine (FLEXERIL) 10 MG tablet, Take 1 tablet (10 mg total) by mouth 3 (three) times daily as needed for muscle spasms., Disp: 60 tablet, Rfl: 3 .  dapagliflozin propanediol (FARXIGA) 10 MG TABS tablet, TAKE ONE (1) TABLET EACH DAY, Disp: 90 tablet, Rfl: 0 .  gabapentin (NEURONTIN) 300 MG capsule, Take 1 capsule (300 mg total) by mouth 2 (two) times daily. Increase to 4x/day as needed, Disp: 60 capsule, Rfl: 1 .  hydrOXYzine (VISTARIL) 100 MG capsule, Take 1 capsule (100 mg total) by mouth 3 (three) times daily as needed for itching., Disp: 30  capsule, Rfl: 0 .  insulin detemir (LEVEMIR) 100 UNIT/ML injection, Inject 0.2 mLs (20 Units total) into the skin at bedtime., Disp: 10 mL, Rfl: 2 .  linaclotide (LINZESS) 72 MCG capsule, Take 72 mcg by mouth daily before breakfast., Disp: , Rfl:  .  liraglutide (VICTOZA) 18 MG/3ML SOPN, Inject 0.2 mLs (1.2 mg total) into the skin at bedtime., Disp: 6 mL, Rfl: 2 .  loratadine (CLARITIN) 10 MG tablet, Take 10 mg by mouth daily., Disp: , Rfl:  .  magnesium hydroxide (MILK OF MAGNESIA) 400 MG/5ML suspension, Take 5-10 mLs by mouth daily as needed for mild constipation., Disp: , Rfl:  .  polyethylene glycol powder (GLYCOLAX/MIRALAX) powder, Take 17 g by mouth 2 (two) times daily as needed. (Patient taking differently: Take 17 g by mouth 2 (two) times daily as needed for moderate constipation. ), Disp: 3350 g, Rfl: 1 .  pravastatin (PRAVACHOL) 80 MG tablet, TAKE ONE (1) TABLET EACH DAY, Disp: 30 tablet, Rfl: 0 .  ranitidine (ZANTAC) 150 MG tablet, Take 1 tablet (150 mg total) by mouth daily., Disp: 90 tablet, Rfl: 3 .  sertraline (ZOLOFT) 50 MG tablet, Take 1 tablet (50 mg total) by mouth daily. (Patient taking differently: Take 50 mg by mouth at bedtime. ), Disp: 30 tablet, Rfl: 3 .  varenicline (CHANTIX STARTING MONTH PAK) 0.5 MG X 11 & 1 MG X 42 tablet, TAKE AS DIRECTED (  Patient not taking: Reported on 11/13/2018), Disp: 53 tablet, Rfl: 0 Social History   Socioeconomic History  . Marital status: Legally Separated    Spouse name: Not on file  . Number of children: 2  . Years of education: Not on file  . Highest education level: Not on file  Occupational History  . Not on file  Social Needs  . Financial resource strain: Not on file  . Food insecurity:    Worry: Not on file    Inability: Not on file  . Transportation needs:    Medical: Not on file    Non-medical: Not on file  Tobacco Use  . Smoking status: Current Every Day Smoker    Packs/day: 1.50    Years: 20.00    Pack years: 30.00     Types: Cigarettes  . Smokeless tobacco: Former Systems developer    Types: Snuff    Quit date: 05/05/1986  Substance and Sexual Activity  . Alcohol use: No  . Drug use: No  . Sexual activity: Not Currently    Birth control/protection: None    Comment: pt states she did not have a tubal  Lifestyle  . Physical activity:    Days per week: Not on file    Minutes per session: Not on file  . Stress: Not on file  Relationships  . Social connections:    Talks on phone: Not on file    Gets together: Not on file    Attends religious service: Not on file    Active member of club or organization: Not on file    Attends meetings of clubs or organizations: Not on file    Relationship status: Not on file  . Intimate partner violence:    Fear of current or ex partner: Not on file    Emotionally abused: Not on file    Physically abused: Not on file    Forced sexual activity: Not on file  Other Topics Concern  . Not on file  Social History Narrative  . Not on file   Family History  Problem Relation Age of Onset  . Heart disease Mother   . Narcolepsy Mother     Objective: Office vital signs reviewed. BP 127/81   Pulse 81   Temp (!) 97.2 F (36.2 C) (Oral)   Ht 5\' 1"  (1.549 m)   Wt 219 lb (99.3 kg)   LMP 09/07/2017   BMI 41.38 kg/m   Physical Examination:  General: Awake, alert, well nourished, No acute distress Skin: Blanching, erythematous rash that has associated wheals along bilateral extensor surfaces of the upper extremities and along chest.  Assessment/ Plan: 48 y.o. female   1. Allergic reaction, subsequent encounter Uncertain etiology.  Initially thought to be secondary to Norco use but at this point I would expect this to be cleared from her system.  She never started the prednisone burst but seem to respond well to the injected steroid.  Because she "flushed it down the toilet" I prescribed her prednisone Dosepak and repeated the Depo-Medrol injection here in office.  I have placed  her on oral antihistamine as well as PRN Vistaril.  Check CBC with differential.  We will pay attention specifically to her eosinophil count.  We discussed that if symptoms were not getting better or not responding to therapies we should consider referral plus or minus blood testing for meat allergy as a nonspecific rash can typically be the presenting symptom/sign.  At this point, nothing to suggest anaphylaxis or  severe allergic reaction.  Moisturization recommended. - methylPREDNISolone acetate (DEPO-MEDROL) injection 40 mg - predniSONE (STERAPRED UNI-PAK 21 TAB) 10 MG (21) TBPK tablet; As directed x 6 days  Dispense: 21 tablet; Refill: 0 - cetirizine (ZYRTEC) 10 MG tablet; Take 1 tablet (10 mg total) by mouth daily.  Dispense: 30 tablet; Refill: 11 - hydrOXYzine (VISTARIL) 100 MG capsule; Take 1 capsule (100 mg total) by mouth 3 (three) times daily as needed for itching.  Dispense: 30 capsule; Refill: 0 - CBC with Differential/Platelet  2. Hives of unknown origin - methylPREDNISolone acetate (DEPO-MEDROL) injection 40 mg - predniSONE (STERAPRED UNI-PAK 21 TAB) 10 MG (21) TBPK tablet; As directed x 6 days  Dispense: 21 tablet; Refill: 0 - cetirizine (ZYRTEC) 10 MG tablet; Take 1 tablet (10 mg total) by mouth daily.  Dispense: 30 tablet; Refill: 11 - hydrOXYzine (VISTARIL) 100 MG capsule; Take 1 capsule (100 mg total) by mouth 3 (three) times daily as needed for itching.  Dispense: 30 capsule; Refill: 0 - CBC with Differential/Platelet   Orders Placed This Encounter  Procedures  . CBC with Differential/Platelet   Meds ordered this encounter  Medications  . methylPREDNISolone acetate (DEPO-MEDROL) injection 40 mg  . predniSONE (STERAPRED UNI-PAK 21 TAB) 10 MG (21) TBPK tablet    Sig: As directed x 6 days    Dispense:  21 tablet    Refill:  0  . cetirizine (ZYRTEC) 10 MG tablet    Sig: Take 1 tablet (10 mg total) by mouth daily.    Dispense:  30 tablet    Refill:  11  . hydrOXYzine  (VISTARIL) 100 MG capsule    Sig: Take 1 capsule (100 mg total) by mouth 3 (three) times daily as needed for itching.    Dispense:  30 capsule    Refill:  [place], DO [place] [phone]

## 2018-11-13 NOTE — Telephone Encounter (Signed)
Please advise when back at the office

## 2018-11-14 ENCOUNTER — Ambulatory Visit: Payer: Medicaid Other | Admitting: Family Medicine

## 2018-11-14 ENCOUNTER — Other Ambulatory Visit: Payer: Self-pay | Admitting: Family Medicine

## 2018-11-14 DIAGNOSIS — Z720 Tobacco use: Secondary | ICD-10-CM

## 2018-11-14 DIAGNOSIS — E785 Hyperlipidemia, unspecified: Secondary | ICD-10-CM

## 2018-11-14 MED ORDER — VARENICLINE TARTRATE 1 MG PO TABS: 1.0000 mg | ORAL_TABLET | Freq: Two times a day (BID) | ORAL | 3 refills | Status: AC

## 2018-11-15 NOTE — Telephone Encounter (Signed)
Last lipid 11/02/17

## 2018-11-16 ENCOUNTER — Telehealth: Payer: Self-pay | Admitting: *Deleted

## 2018-11-16 NOTE — Telephone Encounter (Signed)
"  Are you all moving surgeries around?  I'm scheduled for April 9."  We are not at this time.  "So I'm still on for April 9?"  Yes, you're still scheduled for December 07, 2018.

## 2018-11-22 ENCOUNTER — Encounter: Payer: Self-pay | Admitting: Family Medicine

## 2018-11-22 NOTE — Telephone Encounter (Signed)
"  I'm supposed to have surgery on April 9.  Am I still scheduled?"  Yes, you are still scheduled for surgery on April 9.  "I had received a message that they were rescheduling elective surgeries, so I was wondering."

## 2018-11-28 ENCOUNTER — Telehealth: Payer: Self-pay | Admitting: *Deleted

## 2018-11-28 DIAGNOSIS — M7731 Calcaneal spur, right foot: Secondary | ICD-10-CM

## 2018-11-28 DIAGNOSIS — M722 Plantar fascial fibromatosis: Secondary | ICD-10-CM

## 2018-11-28 DIAGNOSIS — M7661 Achilles tendinitis, right leg: Secondary | ICD-10-CM

## 2018-11-28 NOTE — Telephone Encounter (Signed)
"  Dr. Amalia Hailey said I would get a knee scooter for my surgery.  How do I go about getting one and does Medicaid cover it?"  I will put in an order for [facility].  They will contact you and they will let you know whether or not Medicaid covers it.    I put in an order for a knee scooter and sent a message for Rica Mote with [employer], requesting assistance in supplying the patient with the scooter.

## 2018-12-04 ENCOUNTER — Other Ambulatory Visit: Payer: Self-pay

## 2018-12-04 ENCOUNTER — Ambulatory Visit (INDEPENDENT_AMBULATORY_CARE_PROVIDER_SITE_OTHER): Payer: Medicaid Other | Admitting: Family Medicine

## 2018-12-04 ENCOUNTER — Encounter: Payer: Self-pay | Admitting: Family Medicine

## 2018-12-04 DIAGNOSIS — F339 Major depressive disorder, recurrent, unspecified: Secondary | ICD-10-CM | POA: Diagnosis not present

## 2018-12-04 DIAGNOSIS — E1165 Type 2 diabetes mellitus with hyperglycemia: Secondary | ICD-10-CM

## 2018-12-04 DIAGNOSIS — Z794 Long term (current) use of insulin: Secondary | ICD-10-CM

## 2018-12-04 DIAGNOSIS — L509 Urticaria, unspecified: Secondary | ICD-10-CM | POA: Diagnosis not present

## 2018-12-04 DIAGNOSIS — L500 Allergic urticaria: Secondary | ICD-10-CM | POA: Insufficient documentation

## 2018-12-04 MED ORDER — SERTRALINE HCL 100 MG PO TABS: 100.0000 mg | ORAL_TABLET | Freq: Every day | ORAL | 5 refills | Status: DC

## 2018-12-04 MED ORDER — FREESTYLE LIBRE 14 DAY SENSOR MISC: 2.0000 [IU] | Freq: Once | 3 refills | Status: AC

## 2018-12-04 MED ORDER — HYDROXYZINE PAMOATE 100 MG PO CAPS: 100.0000 mg | ORAL_CAPSULE | Freq: Three times a day (TID) | ORAL | 0 refills | Status: DC | PRN

## 2018-12-04 MED ORDER — FREESTYLE LIBRE 14 DAY READER DEVI: 1.0000 [IU] | Freq: Once | 1 refills | Status: AC

## 2018-12-04 NOTE — Progress Notes (Signed)
Virtual Visit via telephone Note Due to COVID-19, visit is conducted virtually and was requested by patient.  I connected with Lisa Norton on 12/04/18 at 0900 by telephone and verified that I am speaking with the correct person using two identifiers. Lisa Norton is currently located at home and family is currently with them during visit. The provider, Monia Pouch, FNP is located in their office at time of visit.  I discussed the limitations, risks, security and privacy concerns of performing an evaluation and management service by telephone and the availability of in person appointments. I also discussed with the patient that there may be a patient responsible charge related to this service. The patient expressed understanding and agreed to proceed.  Subjective:  Patient ID: Lisa Norton, female    DOB: 05/20/1971, 48 y.o.   MRN: [id number]  Chief Complaint:  Rash and Depression   HPI: Lisa Norton is a 48 y.o. female presenting on 12/04/2018 for Rash and Depression   Pt reports the rash has improved slightly. States she still gets welts several times a day. States if she scratches her arm, she welts up. She states she is taking the Zyrtec, Pepcid, and Atarax. She has completed the prednisone. She denies cough, shortness of breath, trouble swallowing, or swelling of throat or lips. States she has severe pruritis at night. She does not know of a possible trigger.  Pt reports she continues to have decreased energy, fatigue, lack of interest, and changes in appetite. States she is depressed on a daily basis. She denies SI or HI. States she feels she may need to see a psychiatrist at this point.    Relevant past medical, surgical, family, and social history reviewed and updated as indicated.  Allergies and medications reviewed and updated.   Past Medical History:  Diagnosis Date  . Anxiety   . COLD SORE 05/15/2010  . DEPRESSION 12/12/2009  . Diabetes mellitus without complication  ([place])   . High cholesterol   . 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 HYPERTENSION 12/12/2009  . TOBACCO ABUSE 12/12/2009    Past Surgical History:  Procedure Laterality Date  . BLADDER SUSPENSION    . CERVICAL FUSION    . CESAREAN SECTION WITH BILATERAL TUBAL LIGATION Bilateral 02/12/2015   Procedure: CESAREAN SECTION;  Surgeon: Florian Buff, MD;  Location: [facility];  Service: Obstetrics;  Laterality: Bilateral;  Fetal Demise  . CHOLECYSTECTOMY  2006  . EVALUATION UNDER ANESTHESIA WITH HEMORRHOIDECTOMY N/A 10/12/2018   Procedure: LATERAL INTERNAL HEMORRHOID LIGATION/PEXY ANORECTAL EXAM UNDER ANESTHESIA WITH HEMORRHOIDECTOMY X2;  Surgeon: Michael Boston, MD;  Location: [facility];  Service: General;  Laterality: N/A;  . FOOT SURGERY Right    Plantar, bone spurs  . LAPAROSCOPIC VAGINAL HYSTERECTOMY WITH SALPINGECTOMY  02/16/2018   [facility]  . TONSILLECTOMY  1988    Social History   Socioeconomic History  . Marital status: Legally Separated    Spouse name: Not on file  . Number of children: 2  . Years of education: Not on file  . Highest education level: Not on file  Occupational History  . Not on file  Social Needs  . Financial resource strain: Not on file  . Food insecurity:    Worry: Not on file    Inability: Not on file  . Transportation needs:    Medical: Not on file    Non-medical: Not on file  Tobacco Use  . Smoking status: Current Every Day Smoker  Packs/day: 1.50    Years: 20.00    Pack years: 30.00    Types: Cigarettes  . Smokeless tobacco: Former Systems developer    Types: Snuff    Quit date: 05/05/1986  Substance and Sexual Activity  . Alcohol use: No  . Drug use: No  . Sexual activity: Not Currently    Birth control/protection: None    Comment: pt states she did not have a tubal  Lifestyle  . Physical activity:    Days per week: Not on file    Minutes per session: Not on file  . Stress: Not on file  Relationships  . Social connections:    Talks on  phone: Not on file    Gets together: Not on file    Attends religious service: Not on file    Active member of club or organization: Not on file    Attends meetings of clubs or organizations: Not on file    Relationship status: Not on file  . Intimate partner violence:    Fear of current or ex partner: Not on file    Emotionally abused: Not on file    Physically abused: Not on file    Forced sexual activity: Not on file  Other Topics Concern  . Not on file  Social History Narrative  . Not on file    Outpatient Encounter Medications as of 12/04/2018  Medication Sig  . cetirizine (ZYRTEC) 10 MG tablet Take 1 tablet (10 mg total) by mouth daily.  . Continuous Blood Gluc Receiver (FREESTYLE LIBRE 14 DAY READER) DEVI 1 Units by Does not apply route once for 1 dose.  . Continuous Blood Gluc Sensor (FREESTYLE LIBRE 14 DAY SENSOR) MISC 2 Units by Does not apply route once for 1 dose.  . cyclobenzaprine (FLEXERIL) 10 MG tablet Take 1 tablet (10 mg total) by mouth 3 (three) times daily as needed for muscle spasms.  . dapagliflozin propanediol (FARXIGA) 10 MG TABS tablet TAKE ONE (1) TABLET EACH DAY  . gabapentin (NEURONTIN) 300 MG capsule Take 1 capsule (300 mg total) by mouth 2 (two) times daily. Increase to 4x/day as needed  . hydrOXYzine (VISTARIL) 100 MG capsule Take 1 capsule (100 mg total) by mouth 3 (three) times daily as needed for itching.  . insulin detemir (LEVEMIR) 100 UNIT/ML injection Inject 0.2 mLs (20 Units total) into the skin at bedtime.  Marland Kitchen linaclotide (LINZESS) 72 MCG capsule Take 72 mcg by mouth daily before breakfast.  . liraglutide (VICTOZA) 18 MG/3ML SOPN Inject 0.2 mLs (1.2 mg total) into the skin at bedtime.  Marland Kitchen loratadine (CLARITIN) 10 MG tablet Take 10 mg by mouth daily.  . magnesium hydroxide (MILK OF MAGNESIA) 400 MG/5ML suspension Take 5-10 mLs by mouth daily as needed for mild constipation.  . polyethylene glycol powder (GLYCOLAX/MIRALAX) powder Take 17 g by mouth 2  (two) times daily as needed. (Patient taking differently: Take 17 g by mouth 2 (two) times daily as needed for moderate constipation. )  . pravastatin (PRAVACHOL) 80 MG tablet TAKE ONE (1) TABLET EACH DAY  . ranitidine (ZANTAC) 150 MG tablet Take 1 tablet (150 mg total) by mouth daily.  . sertraline (ZOLOFT) 100 MG tablet Take 1 tablet (100 mg total) by mouth at bedtime.  . varenicline (CHANTIX CONTINUING MONTH PAK) 1 MG tablet Take 1 tablet (1 mg total) by mouth 2 (two) times daily for 30 days.  . [DISCONTINUED] hydrOXYzine (VISTARIL) 100 MG capsule Take 1 capsule (100 mg total) by mouth 3 (  three) times daily as needed for itching.  . [DISCONTINUED] predniSONE (STERAPRED UNI-PAK 21 TAB) 10 MG (21) TBPK tablet As directed x 6 days  . [DISCONTINUED] sertraline (ZOLOFT) 50 MG tablet Take 1 tablet (50 mg total) by mouth daily. (Patient taking differently: Take 50 mg by mouth at bedtime. )   No facility-administered encounter medications on file as of 12/04/2018.     Allergies  Allergen Reactions  . Metformin And Related Diarrhea  . Hydrocodone Itching and Rash    Review of Systems  Constitutional: Positive for activity change, appetite change and fatigue. Negative for chills, diaphoresis, fever and unexpected weight change.  HENT: Negative for congestion, sore throat, trouble swallowing and voice change.   Respiratory: Negative for cough, choking, chest tightness, shortness of breath, wheezing and stridor.   Cardiovascular: Negative for chest pain and palpitations.  Gastrointestinal: Negative for abdominal pain, constipation, diarrhea, nausea and vomiting.  Genitourinary: Negative for decreased urine volume and difficulty urinating.  Skin: Positive for color change and rash.  Neurological: Negative for dizziness, syncope, weakness and headaches.  Psychiatric/Behavioral: Positive for decreased concentration, dysphoric mood and sleep disturbance. Negative for agitation, behavioral problems,  confusion, hallucinations, self-injury and suicidal ideas. The patient is not nervous/anxious and is not hyperactive.   All other systems reviewed and are negative.        Observations/Objective: No vital signs or physical exam, this was a telephone or virtual health encounter.  Pt alert and oriented, answers all questions appropriately, and able to speak in full sentences.    Assessment and Plan: Lisa Norton was seen today for rash and depression.  Diagnoses and all orders for this visit:  Recurrent depression ([place]) Ongoing symptoms. Will increase Zoloft to 100 mg daily. Referral to psychiatry. Report any new or worsening symptoms. Will reevaluate in 1 month.  -     sertraline (ZOLOFT) 100 MG tablet; Take 1 tablet (100 mg total) by mouth at bedtime. -     Ambulatory referral to Psychiatry  Hives of unknown origin Due to ongoing symptoms despite treatment, will refer to allergist. Continue medications as prescribed. Report any new or worsening symptoms.  -     Ambulatory referral to Allergy -     hydrOXYzine (VISTARIL) 100 MG capsule; Take 1 capsule (100 mg total) by mouth 3 (three) times daily as needed for itching.  Type 2 diabetes mellitus with hyperglycemia, with long-term current use of insulin (HCC) Due for A1C next month. Blood sugars running in low 100s. Wants to try [place] device, will order today. Follow up in 1 month.  -     Continuous Blood Gluc Receiver (FREESTYLE LIBRE 14 DAY READER) DEVI; 1 Units by Does not apply route once for 1 dose. -     Continuous Blood Gluc Sensor (FREESTYLE LIBRE 14 DAY SENSOR) MISC; 2 Units by Does not apply route once for 1 dose.     Follow Up Instructions: Return in about 1 month (around 01/03/2019), or if symptoms worsen or fail to improve, for chronic medical conditions.    I discussed the assessment and treatment plan with the patient. The patient was provided an opportunity to ask questions and all were answered. The patient agreed with  the plan and demonstrated an understanding of the instructions.   The patient was advised to call back or seek an in-person evaluation if the symptoms worsen or if the condition fails to improve as anticipated.  The above assessment and management plan was discussed with the patient. The patient  verbalized understanding of and has agreed to the management plan. Patient is aware to call the clinic if symptoms persist or worsen. Patient is aware when to return to the clinic for a follow-up visit. Patient educated on when it is appropriate to go to the emergency department.    I provided 15 minutes of non-face-to-face time during this encounter. The call started at 0900. The call ended at 0915.   Monia Pouch, FNP-C [facility] [address] [place],  [id number] [phone]

## 2018-12-07 ENCOUNTER — Other Ambulatory Visit: Payer: Self-pay | Admitting: Podiatry

## 2018-12-07 ENCOUNTER — Encounter: Payer: Self-pay | Admitting: Podiatry

## 2018-12-07 DIAGNOSIS — M7661 Achilles tendinitis, right leg: Secondary | ICD-10-CM

## 2018-12-07 DIAGNOSIS — M216X1 Other acquired deformities of right foot: Secondary | ICD-10-CM

## 2018-12-07 DIAGNOSIS — M722 Plantar fascial fibromatosis: Secondary | ICD-10-CM

## 2018-12-07 MED ORDER — OXYCODONE-ACETAMINOPHEN 5-325 MG PO TABS: 1.0000 | ORAL_TABLET | Freq: Four times a day (QID) | ORAL | 0 refills | Status: DC | PRN

## 2018-12-07 NOTE — Progress Notes (Signed)
Post op pain 

## 2018-12-11 ENCOUNTER — Other Ambulatory Visit: Payer: Self-pay

## 2018-12-11 ENCOUNTER — Ambulatory Visit (INDEPENDENT_AMBULATORY_CARE_PROVIDER_SITE_OTHER): Payer: Medicaid Other

## 2018-12-11 ENCOUNTER — Ambulatory Visit (INDEPENDENT_AMBULATORY_CARE_PROVIDER_SITE_OTHER): Payer: Medicaid Other | Admitting: Podiatry

## 2018-12-11 ENCOUNTER — Other Ambulatory Visit: Payer: Self-pay | Admitting: Family Medicine

## 2018-12-11 ENCOUNTER — Encounter: Payer: Self-pay | Admitting: Podiatry

## 2018-12-11 VITALS — Temp 97.2°F

## 2018-12-11 DIAGNOSIS — M7661 Achilles tendinitis, right leg: Secondary | ICD-10-CM

## 2018-12-11 DIAGNOSIS — Z9889 Other specified postprocedural states: Secondary | ICD-10-CM

## 2018-12-11 DIAGNOSIS — M7731 Calcaneal spur, right foot: Secondary | ICD-10-CM

## 2018-12-11 MED ORDER — OXYCODONE-ACETAMINOPHEN 10-325 MG PO TABS: 1.0000 | ORAL_TABLET | Freq: Four times a day (QID) | ORAL | 0 refills | Status: DC | PRN

## 2018-12-11 NOTE — Progress Notes (Signed)
   Subjective:  Patient presents today status post plantar posterior heel resection right foot with repair of Achilles tendon right. DOS: 12/07/2018.  Patient states that she is having some moderate pain to the posterior heel of the right lower extremity where the surgery was.  It has been hurting a lot.  No new complaints at this time  Past Medical History:  Diagnosis Date  . Anxiety   . COLD SORE 05/15/2010  . DEPRESSION 12/12/2009  . Diabetes mellitus without complication ([place])   . High cholesterol   . 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 HYPERTENSION 12/12/2009  . TOBACCO ABUSE 12/12/2009      Objective/Physical Exam Neurovascular status intact.  Capillary refill within normal limits all digits.  Immobilization fiberglass cast intact.  Radiographic Exam:  Osteotomies sites appear to be stable with routine healing.  Assessment: 1. s/p posterior and plantar heel spur resection with repair of Achilles tendon right. DOS: 12/07/2018   Plan of Care:  1. Patient was evaluated. X-rays reviewed 2.  Cast left intact today.  Felt padding was provided to prevent rubbing to the posterior calf 3.  Continue strict nonweightbearing right lower extremity using the knee scooter 4.  Refill prescription for Percocet 10/325 mg #30 5.  Return to clinic in 1 week for cast removal   Edrick Kins, DPM [facility]  Dr. Edrick Kins, [address]                                        [place], [place] [address]                Office [phone]  Fax [phone]

## 2018-12-20 ENCOUNTER — Ambulatory Visit (INDEPENDENT_AMBULATORY_CARE_PROVIDER_SITE_OTHER): Payer: Medicaid Other | Admitting: Podiatry

## 2018-12-20 ENCOUNTER — Other Ambulatory Visit: Payer: Self-pay

## 2018-12-20 VITALS — Temp 97.7°F

## 2018-12-20 DIAGNOSIS — Z9889 Other specified postprocedural states: Secondary | ICD-10-CM

## 2018-12-20 DIAGNOSIS — M7661 Achilles tendinitis, right leg: Secondary | ICD-10-CM | POA: Diagnosis not present

## 2018-12-20 MED ORDER — OXYCODONE-ACETAMINOPHEN 10-325 MG PO TABS: 1.0000 | ORAL_TABLET | Freq: Four times a day (QID) | ORAL | 0 refills | Status: DC | PRN

## 2018-12-20 NOTE — Progress Notes (Signed)
   Subjective:  Patient presents today status post plantar posterior heel resection right foot with repair of Achilles tendon right. DOS: 12/07/2018.  Patient states there is some modest improvement with the pain.  She has left the cast clean and dry without any complications.  She has had some falls with her knee scooter.  Otherwise no new complaints at this time  Past Medical History:  Diagnosis Date  . Anxiety   . COLD SORE 05/15/2010  . DEPRESSION 12/12/2009  . Diabetes mellitus without complication ([place])   . High cholesterol   . 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 HYPERTENSION 12/12/2009  . TOBACCO ABUSE 12/12/2009      Objective/Physical Exam Neurovascular status intact.  Capillary refill within normal limits all digits.  Immobilization fiberglass cast intact.  Skin incisions appear well coapted.  Moderate edema noted.  No active bleeding noted.   Assessment: 1. s/p posterior and plantar heel spur resection with repair of Achilles tendon right. DOS: 12/07/2018   Plan of Care:  1. Patient was evaluated.  2.  Cast removed today.  Dry sterile dressing was applied. 3.  Cam boot dispensed today.  Continue nonweightbearing in the knee scooter 4.  Refill prescription for Percocet 10/325 mg #30 5.  Return to clinic in 2 weeks  Edrick Kins, DPM [facility]  Dr. Edrick Kins, [address]                                        [place], [place] [address]                Office [phone]  Fax [phone]

## 2018-12-28 ENCOUNTER — Other Ambulatory Visit: Payer: Self-pay | Admitting: Podiatry

## 2018-12-28 MED ORDER — OXYCODONE-ACETAMINOPHEN 10-325 MG PO TABS: 1.0000 | ORAL_TABLET | Freq: Four times a day (QID) | ORAL | 0 refills | Status: DC | PRN

## 2018-12-28 NOTE — Progress Notes (Signed)
DOS  12/07/2018  Retrocalcaneal exostectomy with repair of Achilles right. Plantar heel spur resection right.

## 2018-12-28 NOTE — Telephone Encounter (Signed)
Left message for pt to call with status of her painful foot and for additional comfort measures.

## 2018-12-28 NOTE — Telephone Encounter (Signed)
Rx approved

## 2018-12-28 NOTE — Telephone Encounter (Signed)
Pt called states she is having throbbing pain with occasional sharp pain.

## 2019-01-01 ENCOUNTER — Other Ambulatory Visit: Payer: Self-pay | Admitting: *Deleted

## 2019-01-01 ENCOUNTER — Other Ambulatory Visit: Payer: Self-pay | Admitting: Family Medicine

## 2019-01-01 ENCOUNTER — Telehealth: Payer: Self-pay | Admitting: Family Medicine

## 2019-01-01 DIAGNOSIS — Z794 Long term (current) use of insulin: Principal | ICD-10-CM

## 2019-01-01 DIAGNOSIS — E1165 Type 2 diabetes mellitus with hyperglycemia: Secondary | ICD-10-CM

## 2019-01-01 DIAGNOSIS — E1169 Type 2 diabetes mellitus with other specified complication: Secondary | ICD-10-CM

## 2019-01-01 DIAGNOSIS — I10 Essential (primary) hypertension: Secondary | ICD-10-CM

## 2019-01-01 DIAGNOSIS — E1159 Type 2 diabetes mellitus with other circulatory complications: Secondary | ICD-10-CM

## 2019-01-01 DIAGNOSIS — E785 Hyperlipidemia, unspecified: Secondary | ICD-10-CM

## 2019-01-01 NOTE — Telephone Encounter (Signed)
Order has been placed.  Left message to call back to schedule an appt

## 2019-01-01 NOTE — Telephone Encounter (Signed)
Tell her just to do an office visit on Wednesday or Thursday because I need other labs

## 2019-01-01 NOTE — Telephone Encounter (Signed)
She can have A1C drawn.. I saw her last month and she wanted to wait to have A1C completed.

## 2019-01-01 NOTE — Progress Notes (Signed)
Left message stating order has been placed and will need to call to set up lab appt

## 2019-01-01 NOTE — Telephone Encounter (Signed)
Pt is calling states that she needs to come in and get her A1C drawn, states she doesn't need to see [facility] just  Needs this drawn, please call pt once lab is ordered

## 2019-01-02 ENCOUNTER — Other Ambulatory Visit: Payer: Self-pay | Admitting: Pediatrics

## 2019-01-02 ENCOUNTER — Ambulatory Visit: Payer: Medicaid Other | Admitting: Allergy and Immunology

## 2019-01-02 ENCOUNTER — Other Ambulatory Visit: Payer: Self-pay

## 2019-01-02 ENCOUNTER — Other Ambulatory Visit: Payer: Self-pay | Admitting: Family Medicine

## 2019-01-02 ENCOUNTER — Encounter: Payer: Self-pay | Admitting: Allergy and Immunology

## 2019-01-02 ENCOUNTER — Other Ambulatory Visit: Payer: Self-pay | Admitting: Physician Assistant

## 2019-01-02 VITALS — BP 128/84 | HR 94 | Temp 98.2°F | Resp 20

## 2019-01-02 DIAGNOSIS — L500 Allergic urticaria: Secondary | ICD-10-CM

## 2019-01-02 DIAGNOSIS — J3089 Other allergic rhinitis: Secondary | ICD-10-CM | POA: Diagnosis not present

## 2019-01-02 DIAGNOSIS — J302 Other seasonal allergic rhinitis: Secondary | ICD-10-CM | POA: Insufficient documentation

## 2019-01-02 DIAGNOSIS — F17200 Nicotine dependence, unspecified, uncomplicated: Secondary | ICD-10-CM

## 2019-01-02 DIAGNOSIS — M5442 Lumbago with sciatica, left side: Secondary | ICD-10-CM

## 2019-01-02 DIAGNOSIS — E785 Hyperlipidemia, unspecified: Secondary | ICD-10-CM

## 2019-01-02 MED ORDER — FAMOTIDINE 20 MG PO TABS: 20.0000 mg | ORAL_TABLET | Freq: Two times a day (BID) | ORAL | 5 refills | Status: DC

## 2019-01-02 MED ORDER — FLUTICASONE PROPIONATE 50 MCG/ACT NA SUSP: 2.0000 | Freq: Every day | NASAL | 5 refills | Status: DC

## 2019-01-02 MED ORDER — LEVOCETIRIZINE DIHYDROCHLORIDE 5 MG PO TABS: 5.0000 mg | ORAL_TABLET | Freq: Every evening | ORAL | 5 refills | Status: DC

## 2019-01-02 NOTE — Progress Notes (Signed)
New Patient Note  RE: Lisa Norton MRN: [id number] DOB: 1971-01-28 Date of Office Visit: 01/02/2019  Referring provider: Baruch Gouty, FNP Primary care provider: Baruch Gouty, FNP  Chief Complaint: Urticaria and Allergic Rhinitis    History of present illness: Lisa Norton is a 48 y.o. female seen today in consultation requested by Darla Lesches, [place].  Since March 1st, Lisa Norton has experienced recurrent episodes of hives. The hives have appeared at different times over her entire body.  The lesions are described as erythematous, raised, and pruritic.  Individual hives last less than 24 hours without leaving residual pigmentation or bruising. She denies concomitant angioedema, cardiopulmonary symptoms and GI symptoms. She has not experienced unexpected weight loss, recurrent fevers or drenching night sweats. No specific medication, food, skin care product, detergent, soap, or other environmental triggers have been identified.  Initially, it was suspected that hydrocodone may have been the culprit as she had started this in mid-February after hemorrhoid surgery.  However, she discontinued the hydrocodone as well as all other medications and the hives persisted.  The symptoms do not seem to correlate with NSAIDs use. She does believe that emotional stress has been contributing to the severity of the hives. She did not have symptoms consistent with a respiratory tract infection at the time of symptom onset. Lisa Norton has tried to control symptoms with OTC antihistamines which have offered inadequate temporary relief. She has not been evaluated and treated in the emergency department for these symptoms. Skin biopsy has not been performed. Recently, she has been experiencing hives daily. She experiences nasal congestion, rhinorrhea, thick postnasal drainage, and sneezing.  The symptoms are most frequent and severe during the springtime with pollen exposure.  She attempts to control these symptoms with  loratadine. She is a cigarette smoker with a 45-pack-year history.  Assessment and plan: Recurrent urticaria Unclear etiology. Skin tests to select food allergens were negative today.  Environmental allergen skin test revealed robust reactivity to pollens as well as perennial allergens.  Therefore, this may represent allergic urticaria secondary to aero allergen exposure.  In addition, she believes that emotional stress may be contributing to the severity of the urticaria.  NSAIDs but are not the underlying etiology in this case. Physical urticarias are negative by history (i.e. pressure-induced, temperature, vibration, solar, etc.). History and lesions are not consistent with urticaria pigmentosa so I am not suspicious for mastocytosis. There are no concomitant symptoms concerning for anaphylaxis or constitutional symptoms worrisome for an underlying malignancy. We will rule out other potential etiologies with labs. For symptom relief, patient is to take oral antihistamines as directed.  The following labs have been ordered: FCeRI antibody, anti-thyroglobulin antibody, thyroid peroxidase antibody, tryptase, urea breath test, CBC, CMP, ESR, ANA, and galactose-alpha-1,3-galactose IgE level.  The patient will be called with further recommendations after lab results have returned.  Instructions have been discussed and provided for H1/H2 receptor blockade with titration to find lowest effective dose.  A prescription has been provided for levocetirizine, 5 mg daily as needed.  Should there be a significant increase or change in symptoms, a journal is to be kept recording any foods eaten, beverages consumed, medications taken within a 6 hour period prior to the onset of symptoms, as well as record activities being performed, and environmental conditions. For any symptoms concerning for anaphylaxis, 911 is to be called immediately.  Seasonal and perennial allergic rhinitis  Aeroallergen avoidance  measures have been discussed and provided in written form.  Levocetirizine  has been prescribed (as above).  A prescription has been provided for fluticasone nasal spray, one spray per nostril 1-2 times daily as needed. Proper nasal spray technique has been discussed and demonstrated.  Nasal saline spray (i.e., Simply Saline) or nasal saline lavage (i.e., NeilMed) is recommended as needed and prior to medicated nasal sprays.  If allergen avoidance measures and medications fail to adequately relieve symptoms, aeroallergen immunotherapy will be considered.   Meds ordered this encounter  Medications  . levocetirizine (XYZAL) 5 MG tablet    Sig: Take 1 tablet (5 mg total) by mouth every evening.    Dispense:  60 tablet    Refill:  5  . famotidine (PEPCID) 20 MG tablet    Sig: Take 1 tablet (20 mg total) by mouth 2 (two) times daily.    Dispense:  60 tablet    Refill:  5  . fluticasone (FLONASE) 50 MCG/ACT nasal spray    Sig: Place 2 sprays into both nostrils daily.    Dispense:  16 g    Refill:  5    Diagnostics: Spirometry: Normal with an FEV1 of 83% predicted with an FEV1 ratio of 100%. This study was performed while the patient was asymptomatic.  Please see scanned spirometry results for details. Aeroallergen skin testing: Robust reactivity to grass pollen, tree pollen, dust mite antigen, and cockroach antigen.  Positive to ragweed pollen, weed pollen, and molds. Food allergen skin testing: Negative despite a positive histamine control.    Physical examination: Blood pressure 128/84, pulse 94, temperature 98.2 F (36.8 C), temperature source Oral, resp. rate 20, last menstrual period 09/07/2017, SpO2 97 %.  General: Alert, interactive, in no acute distress. HEENT: TMs pearly gray, turbinates edematous with thick discharge, post-pharynx moderately erythematous. Neck: Supple without lymphadenopathy. Lungs: Clear to auscultation without wheezing, rhonchi or rales. CV: Normal S1,  S2 without murmurs. Abdomen: Nondistended, nontender. Skin: Scattered erythematous urticarial type lesions primarily located on the right lower extremity and left wrist , nonvesicular. Extremities:  No clubbing, cyanosis or edema. Neuro:   Grossly intact.  Review of systems:  Review of systems negative except as noted in HPI / PMHx or noted below: Review of Systems  Constitutional: Negative.   HENT: Negative.   Eyes: Negative.   Respiratory: Negative.   Cardiovascular: Negative.   Gastrointestinal: Negative.   Genitourinary: Negative.   Musculoskeletal: Negative.   Skin: Negative.   Neurological: Negative.   Endo/Heme/Allergies: Negative.   Psychiatric/Behavioral: Negative.     Past medical history:  Past Medical History:  Diagnosis Date  . Anxiety   . COLD SORE 05/15/2010  . DEPRESSION 12/12/2009  . Diabetes mellitus without complication ([place])   . High cholesterol   . 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 HYPERTENSION 12/12/2009  . TOBACCO ABUSE 12/12/2009    Past surgical history:  Past Surgical History:  Procedure Laterality Date  . BLADDER SUSPENSION    . CERVICAL FUSION    . CESAREAN SECTION WITH BILATERAL TUBAL LIGATION Bilateral 02/12/2015   Procedure: CESAREAN SECTION;  Surgeon: Florian Buff, MD;  Location: [facility];  Service: Obstetrics;  Laterality: Bilateral;  Fetal Demise  . CHOLECYSTECTOMY  2006  . EVALUATION UNDER ANESTHESIA WITH HEMORRHOIDECTOMY N/A 10/12/2018   Procedure: LATERAL INTERNAL HEMORRHOID LIGATION/PEXY ANORECTAL EXAM UNDER ANESTHESIA WITH HEMORRHOIDECTOMY X2;  Surgeon: Michael Boston, MD;  Location: [facility];  Service: General;  Laterality: N/A;  . FOOT SURGERY Right    Plantar, bone spurs  . LAPAROSCOPIC VAGINAL HYSTERECTOMY WITH SALPINGECTOMY  02/16/2018  [place]  . TONSILLECTOMY  1988    Family history: Family History  Problem Relation Age of Onset  . Heart disease Mother   . Narcolepsy Mother   . Allergic rhinitis Neg  Hx   . Asthma Neg Hx   . Eczema Neg Hx   . Urticaria Neg Hx     Social history: Social History   Socioeconomic History  . Marital status: Legally Separated    Spouse name: Not on file  . Number of children: 2  . Years of education: Not on file  . Highest education level: Not on file  Occupational History  . Not on file  Social Needs  . Financial resource strain: Not on file  . Food insecurity:    Worry: Not on file    Inability: Not on file  . Transportation needs:    Medical: Not on file    Non-medical: Not on file  Tobacco Use  . Smoking status: Current Every Day Smoker    Packs/day: 1.50    Years: 20.00    Pack years: 30.00    Types: Cigarettes  . Smokeless tobacco: Former Systems developer    Types: Snuff    Quit date: 05/05/1986  Substance and Sexual Activity  . Alcohol use: No  . Drug use: No  . Sexual activity: Not Currently    Birth control/protection: None    Comment: pt states she did not have a tubal  Lifestyle  . Physical activity:    Days per week: Not on file    Minutes per session: Not on file  . Stress: Not on file  Relationships  . Social connections:    Talks on phone: Not on file    Gets together: Not on file    Attends religious service: Not on file    Active member of club or organization: Not on file    Attends meetings of clubs or organizations: Not on file    Relationship status: Not on file  . Intimate partner violence:    Fear of current or ex partner: Not on file    Emotionally abused: Not on file    Physically abused: Not on file    Forced sexual activity: Not on file  Other Topics Concern  . Not on file  Social History Narrative  . Not on file   Environmental History: The patient lives in a house with carpeting in the bedroom and central air/heat.  There are dogs in the home which have access to her bedroom.  There is no known mold/water damage in the home.  She is a cigarette smoker and has smoked 1-1/2 packs/day on average over the past  30 years.  Allergies as of 01/02/2019      Reactions   Metformin And Related Diarrhea   Hydrocodone Itching, Rash      Medication List       Accurate as of Jan 02, 2019  2:19 PM. Always use your most recent med list.        cetirizine 10 MG tablet Commonly known as:  ZYRTEC Take 1 tablet (10 mg total) by mouth daily.   cyclobenzaprine 10 MG tablet Commonly known as:  FLEXERIL Take 1 tablet (10 mg total) by mouth 3 (three) times daily as needed for muscle spasms.   dapagliflozin propanediol 10 MG Tabs tablet Commonly known as:  FARXIGA TAKE ONE (1) TABLET EACH DAY   famotidine 20 MG tablet Commonly known as:  Pepcid Take 1 tablet (  20 mg total) by mouth 2 (two) times daily.   fluticasone 50 MCG/ACT nasal spray Commonly known as:  Flonase Place 2 sprays into both nostrils daily.   gabapentin 300 MG capsule Commonly known as:  NEURONTIN Take 1 capsule (300 mg total) by mouth 2 (two) times daily. Increase to 4x/day as needed   hydrOXYzine 50 MG capsule Commonly known as:  VISTARIL TAKE 2 CAPSULES 3 TIMES DAILY AS NEEDED   insulin detemir 100 UNIT/ML injection Commonly known as:  Levemir Inject 0.2 mLs (20 Units total) into the skin at bedtime.   levocetirizine 5 MG tablet Commonly known as:  XYZAL Take 1 tablet (5 mg total) by mouth every evening.   Linzess 72 MCG capsule Generic drug:  linaclotide Take 72 mcg by mouth daily before breakfast.   liraglutide 18 MG/3ML Sopn Commonly known as:  VICTOZA Inject 0.2 mLs (1.2 mg total) into the skin at bedtime.   loratadine 10 MG tablet Commonly known as:  CLARITIN Take 10 mg by mouth daily.   magnesium hydroxide 400 MG/5ML suspension Commonly known as:  MILK OF MAGNESIA Take 5-10 mLs by mouth daily as needed for mild constipation.   oxyCODONE-acetaminophen 10-325 MG tablet Commonly known as:  PERCOCET Take 1 tablet by mouth every 6 (six) hours as needed for pain.   polyethylene glycol powder 17 GM/SCOOP powder  Commonly known as:  GLYCOLAX/MIRALAX Take 17 g by mouth 2 (two) times daily as needed.   pravastatin 80 MG tablet Commonly known as:  PRAVACHOL TAKE ONE (1) TABLET EACH DAY   ranitidine 150 MG tablet Commonly known as:  ZANTAC Take 1 tablet (150 mg total) by mouth daily.   sertraline 100 MG tablet Commonly known as:  Zoloft Take 1 tablet (100 mg total) by mouth at bedtime.       Known medication allergies: Allergies  Allergen Reactions  . Metformin And Related Diarrhea  . Hydrocodone Itching and Rash    I appreciate the opportunity to take part in Lisa Norton's care. Please do not hesitate to contact me with questions.  Sincerely,   R. Edgar Frisk, MD

## 2019-01-02 NOTE — Patient Instructions (Addendum)
Recurrent urticaria Unclear etiology. Skin tests to select food allergens were negative today.  Environmental allergen skin test revealed robust reactivity to pollens as well as perennial allergens.  Therefore, this may represent allergic urticaria secondary to aero allergen exposure.  In addition, she believes that emotional stress may be contributing to the severity of the urticaria.  NSAIDs but are not the underlying etiology in this case. Physical urticarias are negative by history (i.e. pressure-induced, temperature, vibration, solar, etc.). History and lesions are not consistent with urticaria pigmentosa so I am not suspicious for mastocytosis. There are no concomitant symptoms concerning for anaphylaxis or constitutional symptoms worrisome for an underlying malignancy. We will rule out other potential etiologies with labs. For symptom relief, patient is to take oral antihistamines as directed.  The following labs have been ordered: FCeRI antibody, anti-thyroglobulin antibody, thyroid peroxidase antibody, tryptase, urea breath test, CBC, CMP, ESR, ANA, and galactose-alpha-1,3-galactose IgE level.  The patient will be called with further recommendations after lab results have returned.  Instructions have been discussed and provided for H1/H2 receptor blockade with titration to find lowest effective dose.  A prescription has been provided for levocetirizine, 5 mg daily as needed.  Should there be a significant increase or change in symptoms, a journal is to be kept recording any foods eaten, beverages consumed, medications taken within a 6 hour period prior to the onset of symptoms, as well as record activities being performed, and environmental conditions. For any symptoms concerning for anaphylaxis, 911 is to be called immediately.  Seasonal and perennial allergic rhinitis  Aeroallergen avoidance measures have been discussed and provided in written form.  Levocetirizine has been prescribed (as  above).  A prescription has been provided for fluticasone nasal spray, one spray per nostril 1-2 times daily as needed. Proper nasal spray technique has been discussed and demonstrated.  Nasal saline spray (i.e., Simply Saline) or nasal saline lavage (i.e., NeilMed) is recommended as needed and prior to medicated nasal sprays.  If allergen avoidance measures and medications fail to adequately relieve symptoms, aeroallergen immunotherapy will be considered.   When lab results have returned the patient will be called with further recommendations and follow up instructions.  Urticaria (Hives)  . Levocetirizine (Xyzal) 5 mg twice a day and famotidine (Pepcid) 20 mg twice a day. If no symptoms for 7-14 days then decrease to. . Levocetirizine (Xyzal) 5 mg twice a day and famotidine (Pepcid) 20 mg once a day.  If no symptoms for 7-14 days then decrease to. . Levocetirizine (Xyzal) 5 mg twice a day.  If no symptoms for 7-14 days then decrease to. . Levocetirizine (Xyzal) 5 mg once a day.  May use Benadryl (diphenhydramine) as needed for breakthrough symptoms       If symptoms return, then step up dosage  Control of Dust Mite Allergen  House dust mites play a major role in allergic asthma and rhinitis.  They occur in environments with high humidity wherever human skin, the food for dust mites is found. High levels have been detected in dust obtained from mattresses, pillows, carpets, upholstered furniture, bed covers, clothes and soft toys.  The principal allergen of the house dust mite is found in its feces.  A gram of dust may contain 1,000 mites and 250,000 fecal particles.  Mite antigen is easily measured in the air during house cleaning activities.    1. Encase mattresses, including the box spring, and pillow, in an air tight cover.  Seal the zipper end of the encased  mattresses with wide adhesive tape. 2. Wash the bedding in water of 130 degrees Farenheit weekly.  Avoid cotton  comforters/quilts and flannel bedding: the most ideal bed covering is the dacron comforter. 3. Remove all upholstered furniture from the bedroom. 4. Remove carpets, carpet padding, rugs, and non-washable window drapes from the bedroom.  Wash drapes weekly or use plastic window coverings. 5. Remove all non-washable stuffed toys from the bedroom.  Wash stuffed toys weekly. 6. Have the room cleaned frequently with a vacuum cleaner and a damp dust-mop.  The patient should not be in a room which is being cleaned and should wait 1 hour after cleaning before going into the room. 7. Close and seal all heating outlets in the bedroom.  Otherwise, the room will become filled with dust-laden air.  An electric heater can be used to heat the room. Reduce indoor humidity to less than 50%.  Do not use a humidifier.   Reducing Pollen Exposure  The American Academy of Allergy, Asthma and Immunology suggests the following steps to reduce your exposure to pollen during allergy seasons.    1. Do not hang sheets or clothing out to dry; pollen may collect on these items. 2. Do not mow lawns or spend time around freshly cut grass; mowing stirs up pollen. 3. Keep windows closed at night.  Keep car windows closed while driving. 4. Minimize morning activities outdoors, a time when pollen counts are usually at their highest. 5. Stay indoors as much as possible when pollen counts or humidity is high and on windy days when pollen tends to remain in the air longer. 6. Use air conditioning when possible.  Many air conditioners have filters that trap the pollen spores. 7. Use a HEPA room air filter to remove pollen form the indoor air you breathe.   Control of Dog or Cat Allergen  Avoidance is the best way to manage a dog or cat allergy. If you have a dog or cat and are allergic to dog or cats, consider removing the dog or cat from the home. If you have a dog or cat but don't want to find it a new home, or if your family  wants a pet even though someone in the household is allergic, here are some strategies that may help keep symptoms at bay:  1. Keep the pet out of your bedroom and restrict it to only a few rooms. Be advised that keeping the dog or cat in only one room will not limit the allergens to that room. 2. Don't pet, hug or kiss the dog or cat; if you do, wash your hands with soap and water. 3. High-efficiency particulate air (HEPA) cleaners run continuously in a bedroom or living room can reduce allergen levels over time. 4. Place electrostatic material sheet in the air inlet vent in the bedroom. 5. Regular use of a high-efficiency vacuum cleaner or a central vacuum can reduce allergen levels. 6. Giving your dog or cat a bath at least once a week can reduce airborne allergen.   Control of Mold Allergen  Mold and fungi can grow on a variety of surfaces provided certain temperature and moisture conditions exist.  Outdoor molds grow on plants, decaying vegetation and soil.  The major outdoor mold, Alternaria and Cladosporium, are found in very high numbers during hot and dry conditions.  Generally, a late Summer - Fall peak is seen for common outdoor fungal spores.  Rain will temporarily lower outdoor mold spore count, but counts rise  rapidly when the rainy period ends.  The most important indoor molds are Aspergillus and Penicillium.  Dark, humid and poorly ventilated basements are ideal sites for mold growth.  The next most common sites of mold growth are the bathroom and the kitchen.  Outdoor [employer] 1. Use air conditioning and keep windows closed 2. Avoid exposure to decaying vegetation. 3. Avoid leaf raking. 4. Avoid grain handling. 5. Consider wearing a face mask if working in moldy areas.  Indoor Mold Control 1. Maintain humidity below 50%. 2. Clean washable surfaces with 5% bleach solution. 3. Remove sources e.g. Contaminated carpets.   Control of Cockroach Allergen  Cockroach allergen  has been identified as an important cause of acute attacks of asthma, especially in urban settings.  There are fifty-five species of cockroach that exist in the [place], however only three, the [place], [place] species produce allergen that can affect patients with Asthma.  Allergens can be obtained from fecal particles, egg casings and secretions from cockroaches.    1. Remove food sources. 2. Reduce access to water. 3. Seal access and entry points. 4. Spray runways with 0.5-1% Diazinon or Chlorpyrifos 5. Blow boric acid power under stoves and refrigerator. 6. Place bait stations (hydramethylnon) at feeding sites.

## 2019-01-02 NOTE — Assessment & Plan Note (Addendum)
Unclear etiology. Skin tests to select food allergens were negative today.  Environmental allergen skin test revealed robust reactivity to pollens as well as perennial allergens.  Therefore, this may represent allergic urticaria secondary to aero allergen exposure.  In addition, she believes that emotional stress may be contributing to the severity of the urticaria.  NSAIDs but are not the underlying etiology in this case. Physical urticarias are negative by history (i.e. pressure-induced, temperature, vibration, solar, etc.). History and lesions are not consistent with urticaria pigmentosa so I am not suspicious for mastocytosis. There are no concomitant symptoms concerning for anaphylaxis or constitutional symptoms worrisome for an underlying malignancy. We will rule out other potential etiologies with labs. For symptom relief, patient is to take oral antihistamines as directed.  The following labs have been ordered: FCeRI antibody, anti-thyroglobulin antibody, thyroid peroxidase antibody, tryptase, urea breath test, CBC, CMP, ESR, ANA, and galactose-alpha-1,3-galactose IgE level.  The patient will be called with further recommendations after lab results have returned.  Instructions have been discussed and provided for H1/H2 receptor blockade with titration to find lowest effective dose.  A prescription has been provided for levocetirizine, 5 mg daily as needed.  Should there be a significant increase or change in symptoms, a journal is to be kept recording any foods eaten, beverages consumed, medications taken within a 6 hour period prior to the onset of symptoms, as well as record activities being performed, and environmental conditions. For any symptoms concerning for anaphylaxis, 911 is to be called immediately.

## 2019-01-02 NOTE — Assessment & Plan Note (Signed)
   Aeroallergen avoidance measures have been discussed and provided in written form.  Levocetirizine has been prescribed (as above).  A prescription has been provided for fluticasone nasal spray, one spray per nostril 1-2 times daily as needed. Proper nasal spray technique has been discussed and demonstrated.  Nasal saline spray (i.e., Simply Saline) or nasal saline lavage (i.e., NeilMed) is recommended as needed and prior to medicated nasal sprays.  If allergen avoidance measures and medications fail to adequately relieve symptoms, aeroallergen immunotherapy will be considered.

## 2019-01-03 ENCOUNTER — Ambulatory Visit: Payer: Medicaid Other | Admitting: Podiatry

## 2019-01-03 ENCOUNTER — Encounter: Payer: Self-pay | Admitting: Podiatry

## 2019-01-03 VITALS — Temp 97.7°F

## 2019-01-03 DIAGNOSIS — Z9889 Other specified postprocedural states: Secondary | ICD-10-CM

## 2019-01-03 DIAGNOSIS — M7661 Achilles tendinitis, right leg: Secondary | ICD-10-CM

## 2019-01-03 DIAGNOSIS — M7731 Calcaneal spur, right foot: Secondary | ICD-10-CM

## 2019-01-03 MED ORDER — OXYCODONE-ACETAMINOPHEN 10-325 MG PO TABS: 1.0000 | ORAL_TABLET | Freq: Four times a day (QID) | ORAL | 0 refills | Status: DC | PRN

## 2019-01-03 NOTE — Progress Notes (Signed)
   Subjective:  Patient presents today status post plantar posterior heel resection right foot with repair of Achilles tendon right. DOS: 12/07/2018.  Patient has been nonweightbearing using the knee scooter as directed.  She is also kept the dressings clean dry and intact and wearing her cam walker.  She presents today for suture removal  Past Medical History:  Diagnosis Date  . Anxiety   . COLD SORE 05/15/2010  . DEPRESSION 12/12/2009  . Diabetes mellitus without complication ([place])   . High cholesterol   . 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 HYPERTENSION 12/12/2009  . TOBACCO ABUSE 12/12/2009      Objective/Physical Exam Neurovascular status intact.  Capillary refill within normal limits all digits.  Skin incisions are well coapted.  Moderate edema noted to the surgical extremity.  Negative for any erythema.  Assessment: 1. s/p posterior and plantar heel spur resection with repair of Achilles tendon right. DOS: 12/07/2018   Plan of Care:  1. Patient was evaluated.  2.  Sutures were removed today.  Light dressing applied 3.  Compression ankle sleeve dispensed.  Recommend ankle sleeve daily or Ace wraps that were also provided 4.  Continue nonweightbearing and immobilization cam walker x4 additional weeks 5.  Prescription refill for Percocet 10/325 mg  6.  Return to clinic in 4 weeks for follow-up x-ray and to begin transition to weightbearing  Edrick Kins, DPM [facility]  Dr. Edrick Kins, [address]                                        [place], [place] [address]                Office [phone]  Fax [phone]

## 2019-01-04 ENCOUNTER — Ambulatory Visit: Payer: Medicaid Other | Admitting: Family Medicine

## 2019-01-10 ENCOUNTER — Ambulatory Visit: Payer: Medicaid Other | Admitting: Family Medicine

## 2019-01-10 ENCOUNTER — Other Ambulatory Visit: Payer: Self-pay | Admitting: Family Medicine

## 2019-01-10 ENCOUNTER — Other Ambulatory Visit: Payer: Self-pay | Admitting: Podiatry

## 2019-01-10 DIAGNOSIS — E119 Type 2 diabetes mellitus without complications: Secondary | ICD-10-CM

## 2019-01-11 ENCOUNTER — Encounter: Payer: Self-pay | Admitting: Podiatry

## 2019-01-11 MED ORDER — DAPAGLIFLOZIN PROPANEDIOL 10 MG PO TABS: 10.0000 mg | ORAL_TABLET | Freq: Every day | ORAL | 0 refills | Status: DC

## 2019-01-11 NOTE — Telephone Encounter (Signed)
Left message on both home phone requesting a call back to discuss status of foot.

## 2019-01-12 ENCOUNTER — Encounter: Payer: Self-pay | Admitting: Podiatry

## 2019-01-12 MED ORDER — OXYCODONE-ACETAMINOPHEN 10-325 MG PO TABS: 1.0000 | ORAL_TABLET | Freq: Four times a day (QID) | ORAL | 0 refills | Status: DC | PRN

## 2019-01-16 LAB — HELICOBACTER PYLORI  ANTIBODY, IGM: H pylori, IgM Abs: <9 units (ref 0.0–8.9)

## 2019-01-18 ENCOUNTER — Other Ambulatory Visit: Payer: Self-pay

## 2019-01-19 ENCOUNTER — Ambulatory Visit (INDEPENDENT_AMBULATORY_CARE_PROVIDER_SITE_OTHER): Payer: Medicaid Other | Admitting: Family Medicine

## 2019-01-19 ENCOUNTER — Encounter: Payer: Self-pay | Admitting: Family Medicine

## 2019-01-19 VITALS — BP 112/71 | HR 82 | Temp 97.9°F | Ht 61.0 in

## 2019-01-19 DIAGNOSIS — E1169 Type 2 diabetes mellitus with other specified complication: Secondary | ICD-10-CM

## 2019-01-19 DIAGNOSIS — F339 Major depressive disorder, recurrent, unspecified: Secondary | ICD-10-CM

## 2019-01-19 DIAGNOSIS — Z794 Long term (current) use of insulin: Secondary | ICD-10-CM

## 2019-01-19 DIAGNOSIS — E1165 Type 2 diabetes mellitus with hyperglycemia: Secondary | ICD-10-CM

## 2019-01-19 DIAGNOSIS — E785 Hyperlipidemia, unspecified: Secondary | ICD-10-CM

## 2019-01-19 LAB — BAYER DCA HB A1C WAIVED: HB A1C (BAYER DCA - WAIVED): 7.3 % — ABNORMAL HIGH (ref <7.0)

## 2019-01-19 MED ORDER — DAPAGLIFLOZIN PROPANEDIOL 10 MG PO TABS: 10.0000 mg | ORAL_TABLET | Freq: Every day | ORAL | 4 refills | Status: DC

## 2019-01-19 MED ORDER — LIRAGLUTIDE 18 MG/3ML ~~LOC~~ SOPN: 1.2000 mg | PEN_INJECTOR | Freq: Every day | SUBCUTANEOUS | 2 refills | Status: DC

## 2019-01-19 MED ORDER — INSULIN DETEMIR 100 UNIT/ML ~~LOC~~ SOLN: SUBCUTANEOUS | 4 refills | Status: DC

## 2019-01-19 NOTE — Patient Instructions (Signed)

## 2019-01-19 NOTE — Progress Notes (Signed)
Subjective:  Patient ID: Lisa Norton, female    DOB: 14-Mar-1971, 48 y.o.   MRN: [id number]  Chief Complaint:  Medical Management of Chronic Issues   HPI: Lisa Norton is a 48 y.o. female presenting on 01/19/2019 for Medical Management of Chronic Issues   1. Recurrent depression ([place])  Doing very well on current medications. States she has her moments but there are minimal. She denies SI or HI. Compliant with medications without associated side effects.    2. Hyperlipidemia associated with type 2 diabetes mellitus ([place])  Compliant with mediations. Denies myalgias or arthralgias. Does not watch diet or exercise on a regular basis.    3. Type 2 diabetes mellitus with hyperglycemia, with long-term current use of insulin ([facility])  Diabetes mellitus 2 Compliant with meds - Yes Checking CBGs? No Exercising regularly? - No Watching carbohydrate intake? - No Neuropathy ? - No Hypoglycemic events - No Pertinent ROS:  Polyuria - No Polydipsia - No Vision problems - No   Pt did have recent right foot surgery and is healing well. She has followed up with allergist in regards to unknown origin of rash and is undergoing allergy testing. Awaiting results.   Relevant past medical, surgical, family, and social history reviewed and updated as indicated.  Allergies and medications reviewed and updated.   Past Medical History:  Diagnosis Date  . Anxiety   . COLD SORE 05/15/2010  . DEPRESSION 12/12/2009  . Diabetes mellitus without complication ([place])   . High cholesterol   . 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 HYPERTENSION 12/12/2009  . TOBACCO ABUSE 12/12/2009    Past Surgical History:  Procedure Laterality Date  . BLADDER SUSPENSION    . CERVICAL FUSION    . CESAREAN SECTION WITH BILATERAL TUBAL LIGATION Bilateral 02/12/2015   Procedure: CESAREAN SECTION;  Surgeon: Florian Buff, MD;  Location: [facility];  Service: Obstetrics;  Laterality: Bilateral;  Fetal Demise  . CHOLECYSTECTOMY  2006  .  EVALUATION UNDER ANESTHESIA WITH HEMORRHOIDECTOMY N/A 10/12/2018   Procedure: LATERAL INTERNAL HEMORRHOID LIGATION/PEXY ANORECTAL EXAM UNDER ANESTHESIA WITH HEMORRHOIDECTOMY X2;  Surgeon: Michael Boston, MD;  Location: [facility];  Service: General;  Laterality: N/A;  . FOOT SURGERY Right    Plantar, bone spurs  . LAPAROSCOPIC VAGINAL HYSTERECTOMY WITH SALPINGECTOMY  02/16/2018   [facility]  . TONSILLECTOMY  1988    Social History   Socioeconomic History  . Marital status: Legally Separated    Spouse name: Not on file  . Number of children: 2  . Years of education: Not on file  . Highest education level: Not on file  Occupational History  . Not on file  Social Needs  . Financial resource strain: Not on file  . Food insecurity:    Worry: Not on file    Inability: Not on file  . Transportation needs:    Medical: Not on file    Non-medical: Not on file  Tobacco Use  . Smoking status: Current Every Day Smoker    Packs/day: 1.50    Years: 20.00    Pack years: 30.00    Types: Cigarettes  . Smokeless tobacco: Former Systems developer    Types: Snuff    Quit date: 05/05/1986  Substance and Sexual Activity  . Alcohol use: No  . Drug use: No  . Sexual activity: Not Currently    Birth control/protection: None    Comment: pt states she did not have a tubal  Lifestyle  .  Physical activity:    Days per week: Not on file    Minutes per session: Not on file  . Stress: Not on file  Relationships  . Social connections:    Talks on phone: Not on file    Gets together: Not on file    Attends religious service: Not on file    Active member of club or organization: Not on file    Attends meetings of clubs or organizations: Not on file    Relationship status: Not on file  . Intimate partner violence:    Fear of current or ex partner: Not on file    Emotionally abused: Not on file    Physically abused: Not on file    Forced sexual activity: Not on file  Other Topics Concern  .  Not on file  Social History Narrative  . Not on file    Outpatient Encounter Medications as of 01/19/2019  Medication Sig  . cetirizine (ZYRTEC) 10 MG tablet Take 1 tablet (10 mg total) by mouth daily.  . cyclobenzaprine (FLEXERIL) 10 MG tablet Take 1 tablet (10 mg total) by mouth 3 (three) times daily as needed for muscle spasms.  . dapagliflozin propanediol (FARXIGA) 10 MG TABS tablet Take 10 mg by mouth daily.  . famotidine (PEPCID) 20 MG tablet Take 1 tablet (20 mg total) by mouth 2 (two) times daily.  . fluticasone (FLONASE) 50 MCG/ACT nasal spray Place 2 sprays into both nostrils daily.  Marland Kitchen gabapentin (NEURONTIN) 300 MG capsule Take 1 capsule (300 mg total) by mouth 2 (two) times daily. Increase to 4x/day as needed  . hydrOXYzine (VISTARIL) 50 MG capsule TAKE 2 CAPSULES 3 TIMES DAILY AS NEEDED  . insulin detemir (LEVEMIR) 100 UNIT/ML injection INJECT 0.2ML (20 UNITS TOTAL) SQ AT BEDTIME  . levocetirizine (XYZAL) 5 MG tablet Take 1 tablet (5 mg total) by mouth every evening.  . linaclotide (LINZESS) 72 MCG capsule Take 72 mcg by mouth daily before breakfast.  . loratadine (CLARITIN) 10 MG tablet Take 10 mg by mouth daily.  . magnesium hydroxide (MILK OF MAGNESIA) 400 MG/5ML suspension Take 5-10 mLs by mouth daily as needed for mild constipation.  Marland Kitchen oxyCODONE-acetaminophen (PERCOCET) 10-325 MG tablet Take 1 tablet by mouth every 6 (six) hours as needed for pain.  . polyethylene glycol powder (GLYCOLAX/MIRALAX) powder Take 17 g by mouth 2 (two) times daily as needed. (Patient taking differently: Take 17 g by mouth 2 (two) times daily as needed for moderate constipation. )  . pravastatin (PRAVACHOL) 80 MG tablet TAKE ONE (1) TABLET EACH DAY  . sertraline (ZOLOFT) 100 MG tablet Take 1 tablet (100 mg total) by mouth at bedtime.  . [DISCONTINUED] dapagliflozin propanediol (FARXIGA) 10 MG TABS tablet Take 10 mg by mouth daily.  . [DISCONTINUED] LEVEMIR 100 UNIT/ML injection INJECT 0.2ML (20 UNITS  TOTAL) SQ AT BEDTIME  . Accu-Chek FastClix Lancets MISC 4 TIMES DAILY  . ACCU-CHEK GUIDE test strip 4 TIMES DAILY  . liraglutide (VICTOZA) 18 MG/3ML SOPN Inject 0.2 mLs (1.2 mg total) into the skin at bedtime.  . [DISCONTINUED] liraglutide (VICTOZA) 18 MG/3ML SOPN Inject 0.2 mLs (1.2 mg total) into the skin at bedtime.  . [DISCONTINUED] ranitidine (ZANTAC) 150 MG tablet Take 1 tablet (150 mg total) by mouth daily.   No facility-administered encounter medications on file as of 01/19/2019.     Allergies  Allergen Reactions  . Metformin And Related Diarrhea  . Hydrocodone Itching and Rash    Review of Systems  Constitutional: Negative for appetite change, chills, fatigue, fever and unexpected weight change.  Eyes: Negative for photophobia and visual disturbance.  Respiratory: Negative for cough, chest tightness, shortness of breath and wheezing.   Cardiovascular: Negative for chest pain, palpitations and leg swelling.  Gastrointestinal: Negative for abdominal pain, anal bleeding, blood in stool, constipation, diarrhea, nausea and vomiting.  Endocrine: Negative for polydipsia, polyphagia and polyuria.  Genitourinary: Negative for decreased urine volume and difficulty urinating.  Musculoskeletal: Positive for arthralgias.  Skin: Positive for rash.  Neurological: Negative for dizziness, tremors, syncope, facial asymmetry, speech difficulty, weakness, light-headedness, numbness and headaches.  Hematological: Does not bruise/bleed easily.  Psychiatric/Behavioral: Negative for confusion, sleep disturbance and suicidal ideas.  All other systems reviewed and are negative.       Objective:  BP 112/71   Pulse 82   Temp 97.9 F (36.6 C) (Oral)   Ht _0  (1.549 m)   LMP 09/07/2017   BMI 41.38 kg/m    Wt Readings from Last 3 Encounters:  11/13/18 219 lb (99.3 kg)  10/30/18 220 lb (99.8 kg)  10/12/18 223 lb (101.2 kg)    Physical Exam Vitals signs and nursing note reviewed.   Constitutional:      General: She is not in acute distress.    Appearance: Normal appearance. She is well-developed and well-groomed. She is obese. She is not ill-appearing or toxic-appearing.  HENT:     Head: Normocephalic and atraumatic.     Right Ear: Hearing normal.     Left Ear: Hearing normal.     Mouth/Throat:     Lips: Pink.     Mouth: Mucous membranes are moist.     Pharynx: Oropharynx is clear. Uvula midline.  Eyes:     Extraocular Movements: Extraocular movements intact.     Conjunctiva/sclera: Conjunctivae normal.     Pupils: Pupils are equal, round, and reactive to light.  Neck:     Musculoskeletal: Normal range of motion and neck supple.     Thyroid: No thyroid mass, thyromegaly or thyroid tenderness.     Vascular: No carotid bruit or JVD.     Trachea: Trachea and phonation normal.  Cardiovascular:     Rate and Rhythm: Normal rate and regular rhythm.     Heart sounds: Normal heart sounds. No murmur. No friction rub. No gallop.   Pulmonary:     Effort: Pulmonary effort is normal. No respiratory distress.     Breath sounds: Normal breath sounds.  Abdominal:     General: Bowel sounds are normal.     Palpations: Abdomen is soft.  Musculoskeletal:     Right lower leg: No edema.     Left lower leg: No edema.  Skin:    General: Skin is warm and dry.     Capillary Refill: Capillary refill takes less than 2 seconds.     Findings: No bruising or rash.  Neurological:     General: No focal deficit present.     Mental Status: She is alert and oriented to person, place, and time.     Cranial Nerves: No cranial nerve deficit.     Sensory: No sensory deficit.     Motor: No weakness.     Coordination: Coordination normal.     Gait: Gait normal.     Deep Tendon Reflexes: Reflexes normal.  Psychiatric:        Attention and Perception: Attention and perception normal.        Mood and Affect: Mood and affect  normal.        Speech: Speech normal.        Behavior: Behavior  normal. Behavior is cooperative.        Thought Content: Thought content normal. Thought content does not include homicidal or suicidal ideation. Thought content does not include homicidal or suicidal plan.        Cognition and Memory: Cognition and memory normal.        Judgment: Judgment normal.     Results for orders placed or performed in visit on 84/12/82  HELICOBACTER PYLORI  ANTIBODY, IGM  Result Value Ref Range   H pylori, IgM Abs <9.0 0.0 - 8.9 units       Pertinent labs & imaging results that were available during my care of the patient were reviewed by me and considered in my medical decision making.  Assessment & Plan:  Kamaria was seen today for medical management of chronic issues.  Diagnoses and all orders for this visit:  Recurrent depression ([place]) Doing well on current regimen. Does not wish to see counselor. Continue medications as prescribed. Report any new or worsening symptoms.   Hyperlipidemia associated with type 2 diabetes mellitus ([place]) Diet and exercise encouraged. Medications as prescribed. Labs pending.  -     Lipid panel -     CMP14+EGFR  Type 2 diabetes mellitus with hyperglycemia, with long-term current use of insulin ([facility]) A1C 7.3 in office today. Down from 8.1. Diet and exercise encouraged. Will recheck in 3 months and change therapy if warranted. Report any persistent high readings. Labs pending.  -     CMP14+EGFR -     Bayer DCA Hb A1c Waived -     dapagliflozin propanediol (FARXIGA) 10 MG TABS tablet; Take 10 mg by mouth daily. -     insulin detemir (LEVEMIR) 100 UNIT/ML injection; INJECT 0.2ML (20 UNITS TOTAL) SQ AT BEDTIME -     liraglutide (VICTOZA) 18 MG/3ML SOPN; Inject 0.2 mLs (1.2 mg total) into the skin at bedtime.     Continue all other maintenance medications.  Follow up plan: Return in about 3 months (around 04/21/2019), or if symptoms worsen or fail to improve, for CPE.  Educational handout given for depression  The above  assessment and management plan was discussed with the patient. The patient verbalized understanding of and has agreed to the management plan. Patient is aware to call the clinic if symptoms persist or worsen. Patient is aware when to return to the clinic for a follow-up visit. Patient educated on when it is appropriate to go to the emergency department.   Monia Pouch, FNP-C [facility] [phone]

## 2019-01-20 LAB — THYROID ANTIBODIES
Thyroglobulin Antibody: <1 IU/mL (ref 0.0–0.9)
Thyroperoxidase Ab SerPl-aCnc: 9 IU/mL (ref 0–34)

## 2019-01-20 LAB — ALPHA-GAL PANEL
Alpha Gal IgE*: 5.48 kU/L — ABNORMAL HIGH (ref <0.10)
Beef (Bos spp) IgE: 2.53 kU/L — ABNORMAL HIGH (ref <0.35)
Class Interpretation: 2
Class Interpretation: 2
Class Interpretation: 2
Lamb/Mutton (Ovis spp) IgE: 0.78 kU/L — ABNORMAL HIGH (ref <0.35)
Pork (Sus spp) IgE: 1.03 kU/L — ABNORMAL HIGH (ref <0.35)

## 2019-01-20 LAB — CMP14+EGFR
ALT: 11 IU/L (ref 0–32)
AST: 14 IU/L (ref 0–40)
Albumin/Globulin Ratio: 1.7 (ref 1.2–2.2)
Albumin: 3.8 g/dL (ref 3.8–4.8)
Alkaline Phosphatase: 105 IU/L (ref 39–117)
BUN/Creatinine Ratio: 21 (ref 9–23)
BUN: 15 mg/dL (ref 6–24)
Bilirubin Total: 0.2 mg/dL (ref 0.0–1.2)
CO2: 19 mmol/L — ABNORMAL LOW (ref 20–29)
Calcium: 9 mg/dL (ref 8.7–10.2)
Chloride: 102 mmol/L (ref 96–106)
Creatinine, Ser: 0.73 mg/dL (ref 0.57–1.00)
GFR calc Af Amer: 114 mL/min/{1.73_m2} (ref >59)
GFR calc non Af Amer: 99 mL/min/{1.73_m2} (ref >59)
Globulin, Total: 2.2 g/dL (ref 1.5–4.5)
Glucose: 189 mg/dL — ABNORMAL HIGH (ref 65–99)
Potassium: 3.5 mmol/L (ref 3.5–5.2)
Sodium: 138 mmol/L (ref 134–144)
Total Protein: 6 g/dL (ref 6.0–8.5)

## 2019-01-20 LAB — LIPID PANEL
Chol/HDL Ratio: 8.2 ratio — ABNORMAL HIGH (ref 0.0–4.4)
Cholesterol, Total: 172 mg/dL (ref 100–199)
HDL: 21 mg/dL — ABNORMAL LOW (ref >39)
LDL Calculated: 83 mg/dL (ref 0–99)
Triglycerides: 338 mg/dL — ABNORMAL HIGH (ref 0–149)
VLDL Cholesterol Cal: 68 mg/dL — ABNORMAL HIGH (ref 5–40)

## 2019-01-20 LAB — CBC
Hematocrit: 45.8 % (ref 34.0–46.6)
Hemoglobin: 15.7 g/dL (ref 11.1–15.9)
MCH: 32.4 pg (ref 26.6–33.0)
MCHC: 34.3 g/dL (ref 31.5–35.7)
MCV: 95 fL (ref 79–97)
Platelets: 221 10*3/uL (ref 150–450)
RBC: 4.84 x10E6/uL (ref 3.77–5.28)
RDW: 12.5 % (ref 11.7–15.4)
WBC: 8.3 10*3/uL (ref 3.4–10.8)

## 2019-01-20 LAB — CBC WITH DIFFERENTIAL/PLATELET
Basophils Absolute: 0.1 10*3/uL (ref 0.0–0.2)
Basos: 1 %
EOS (ABSOLUTE): 0.2 10*3/uL (ref 0.0–0.4)
Eos: 2 %
Hematocrit: 46.7 % — ABNORMAL HIGH (ref 34.0–46.6)
Hemoglobin: 15.4 g/dL (ref 11.1–15.9)
Immature Grans (Abs): 0 10*3/uL (ref 0.0–0.1)
Immature Granulocytes: 0 %
Lymphocytes Absolute: 2.3 10*3/uL (ref 0.7–3.1)
Lymphs: 31 %
MCH: 32.1 pg (ref 26.6–33.0)
MCHC: 33 g/dL (ref 31.5–35.7)
MCV: 97 fL (ref 79–97)
Monocytes Absolute: 0.6 10*3/uL (ref 0.1–0.9)
Monocytes: 8 %
Neutrophils Absolute: 4.4 10*3/uL (ref 1.4–7.0)
Neutrophils: 58 %
Platelets: 188 10*3/uL (ref 150–450)
RBC: 4.8 x10E6/uL (ref 3.77–5.28)
RDW: 12.6 % (ref 11.7–15.4)
WBC: 7.6 10*3/uL (ref 3.4–10.8)

## 2019-01-20 LAB — COMPREHENSIVE METABOLIC PANEL
ALT: 16 IU/L (ref 0–32)
AST: 15 IU/L (ref 0–40)
Albumin/Globulin Ratio: 1.6 (ref 1.2–2.2)
Albumin: 4 g/dL (ref 3.8–4.8)
Alkaline Phosphatase: 119 IU/L — ABNORMAL HIGH (ref 39–117)
BUN/Creatinine Ratio: 19 (ref 9–23)
BUN: 16 mg/dL (ref 6–24)
Bilirubin Total: <0.2 mg/dL (ref 0.0–1.2)
CO2: 21 mmol/L (ref 20–29)
Calcium: 9.2 mg/dL (ref 8.7–10.2)
Chloride: 105 mmol/L (ref 96–106)
Creatinine, Ser: 0.84 mg/dL (ref 0.57–1.00)
GFR calc Af Amer: 96 mL/min/{1.73_m2} (ref >59)
GFR calc non Af Amer: 84 mL/min/{1.73_m2} (ref >59)
Globulin, Total: 2.5 g/dL (ref 1.5–4.5)
Glucose: 198 mg/dL — ABNORMAL HIGH (ref 65–99)
Potassium: 3.9 mmol/L (ref 3.5–5.2)
Sodium: 140 mmol/L (ref 134–144)
Total Protein: 6.5 g/dL (ref 6.0–8.5)

## 2019-01-20 LAB — ANA: Anti Nuclear Antibody (ANA): NEGATIVE

## 2019-01-20 LAB — CHRONIC URTICARIA: cu index: 2.9 (ref <10)

## 2019-01-20 LAB — SEDIMENTATION RATE: Sed Rate: 36 mm/hr — ABNORMAL HIGH (ref 0–32)

## 2019-01-20 LAB — TSH: TSH: 2.58 u[IU]/mL (ref 0.450–4.500)

## 2019-01-20 LAB — TRYPTASE: Tryptase: 10.8 ug/L (ref 2.2–13.2)

## 2019-01-21 ENCOUNTER — Encounter: Payer: Self-pay | Admitting: Podiatry

## 2019-01-23 ENCOUNTER — Encounter: Payer: Self-pay | Admitting: Family Medicine

## 2019-01-23 DIAGNOSIS — Z91018 Allergy to other foods: Secondary | ICD-10-CM | POA: Insufficient documentation

## 2019-01-23 MED ORDER — ICOSAPENT ETHYL 1 G PO CAPS: 2.0000 | ORAL_CAPSULE | Freq: Two times a day (BID) | ORAL | 3 refills | Status: DC

## 2019-01-23 NOTE — Telephone Encounter (Signed)
Left message for pt to call for an appt with Dr. Amalia Hailey either today in [place] or tomorrow in [place], to apply a light amount of antibiotic ointment to the site and cover, rest and elevate.

## 2019-01-23 NOTE — Addendum Note (Signed)
Addended by: Baruch Gouty on: 01/23/2019 07:50 AM   Modules accepted: Orders

## 2019-01-24 ENCOUNTER — Telehealth: Payer: Self-pay | Admitting: Podiatry

## 2019-01-24 NOTE — Telephone Encounter (Signed)
Patient called to schedule appointment due to surgery incision drainage based on Valery's recommendation. Patient was offered same day appointment and a next day appointment but wanted to wait until Friday 01/26/2019. I expressed I wanted to get her in as soon as possible due to the drainage but patient refused and requested a Friday appointment.

## 2019-01-25 ENCOUNTER — Telehealth: Payer: Self-pay

## 2019-01-25 NOTE — Telephone Encounter (Signed)
Vascepa is not covered under patient's insurance plan, Medicaid.  Formulary drugs are Fenofibrate tablets or Gemfibrozil tablets.

## 2019-01-25 NOTE — Telephone Encounter (Signed)
Left message to please call our office. 

## 2019-01-25 NOTE — Telephone Encounter (Signed)
She can do over the counter Fish Oil. I do not want to add fenofibrate due to risk of myalgias with statin

## 2019-01-26 ENCOUNTER — Encounter: Payer: Self-pay | Admitting: *Deleted

## 2019-01-26 ENCOUNTER — Ambulatory Visit (INDEPENDENT_AMBULATORY_CARE_PROVIDER_SITE_OTHER): Payer: Medicaid Other | Admitting: Podiatry

## 2019-01-26 ENCOUNTER — Other Ambulatory Visit: Payer: Self-pay

## 2019-01-26 ENCOUNTER — Other Ambulatory Visit: Payer: Self-pay | Admitting: *Deleted

## 2019-01-26 DIAGNOSIS — L02619 Cutaneous abscess of unspecified foot: Secondary | ICD-10-CM

## 2019-01-26 DIAGNOSIS — L03119 Cellulitis of unspecified part of limb: Secondary | ICD-10-CM

## 2019-01-26 MED ORDER — EPINEPHRINE 0.3 MG/0.3ML IJ SOAJ: 0.3000 mg | INTRAMUSCULAR | 2 refills | Status: DC | PRN

## 2019-01-26 MED ORDER — DOXYCYCLINE HYCLATE 100 MG PO TABS: 100.0000 mg | ORAL_TABLET | Freq: Two times a day (BID) | ORAL | 0 refills | Status: DC

## 2019-01-31 ENCOUNTER — Encounter: Payer: Medicaid Other | Admitting: Podiatry

## 2019-02-01 DIAGNOSIS — F331 Major depressive disorder, recurrent, moderate: Secondary | ICD-10-CM
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
## 2019-02-02 ENCOUNTER — Telehealth: Payer: Self-pay | Admitting: *Deleted

## 2019-02-02 NOTE — Telephone Encounter (Signed)
Went over lab results with pt on 5/29 to inform her per Dr. Verlin Fester that pt has alpha gal syndrome and pt called back confused because she stated she has not had a tick bite in a few years and her symptoms just started in March. Please advise.

## 2019-02-02 NOTE — Telephone Encounter (Signed)
I'm going to forward this message to Dr. Verlin Fester and he can address when he is in the office on Monday.

## 2019-02-05 NOTE — Telephone Encounter (Signed)
For some people symptoms will manifest within weeks of a tick bite, for others time course is much longer.  In addition, alpha gal may be transmitted via chiggers and tick larvae as well and these bites are less easily recognized than tick bites. She had serum specific IgE elevation to beef, pork, lamb, and galactose-alpha-1,3-galactose.  Having positive results to everything on the alpha gal panel in the context of all other labs being unrevealing, and given her symptoms, strongly suggests alpha gal hypersensitivity.  I would recommend meticulous avoidance of mammalian meat and have access to epinephrine autoinjectors.   If the symptoms occur in the absence of strict avoidance of mammalian meat, a journal is to be kept recording any foods eaten, beverages consumed, and medications taken within a 6 hour time period prior to the onset of symptoms, as well as record activities being performed, and environmental conditions. For any symptoms concerning for anaphylaxis, epinephrine is to be administered and 911 is to be called immediately. Thanks.

## 2019-02-05 NOTE — Telephone Encounter (Signed)
Left a message for pt to return call 

## 2019-02-06 NOTE — Telephone Encounter (Signed)
Pt returned my call and we discussed Dr. Barnett Hatter note

## 2019-02-07 ENCOUNTER — Encounter (HOSPITAL_COMMUNITY): Payer: Self-pay | Admitting: Psychiatry

## 2019-02-07 ENCOUNTER — Other Ambulatory Visit: Payer: Self-pay

## 2019-02-07 ENCOUNTER — Ambulatory Visit (INDEPENDENT_AMBULATORY_CARE_PROVIDER_SITE_OTHER): Payer: Medicaid Other | Admitting: Psychiatry

## 2019-02-07 DIAGNOSIS — F331 Major depressive disorder, recurrent, moderate: Secondary | ICD-10-CM | POA: Diagnosis not present

## 2019-02-07 DIAGNOSIS — F4310 Post-traumatic stress disorder, unspecified: Secondary | ICD-10-CM | POA: Diagnosis not present

## 2019-02-07 MED ORDER — SERTRALINE HCL 100 MG PO TABS: 150.0000 mg | ORAL_TABLET | Freq: Every day | ORAL | 0 refills | Status: DC

## 2019-02-07 NOTE — Patient Instructions (Signed)
1. Increase sertraline 150 mg daily  2. Referral to therapy 3. Next appointment: 7/8 at 10:30

## 2019-02-11 ENCOUNTER — Other Ambulatory Visit: Payer: Self-pay | Admitting: Physician Assistant

## 2019-02-12 ENCOUNTER — Telehealth: Payer: Self-pay

## 2019-02-12 ENCOUNTER — Other Ambulatory Visit: Payer: Self-pay | Admitting: Physician Assistant

## 2019-02-12 ENCOUNTER — Other Ambulatory Visit: Payer: Self-pay | Admitting: *Deleted

## 2019-02-12 DIAGNOSIS — M5442 Lumbago with sciatica, left side: Secondary | ICD-10-CM

## 2019-02-12 NOTE — Telephone Encounter (Signed)
Patient's Medicaid will not pay for Vascepa.  Alternatives are Fenofibrate tablet or Gemfibrozil tablet.

## 2019-02-12 NOTE — Telephone Encounter (Signed)
She can take over the counter Fish Oil. The others that are covered will increase her risk of myalgias.

## 2019-02-12 NOTE — Telephone Encounter (Signed)
Patient aware of recommendation.  

## 2019-02-19 ENCOUNTER — Ambulatory Visit: Payer: Medicaid Other | Admitting: Podiatry

## 2019-02-19 ENCOUNTER — Ambulatory Visit (INDEPENDENT_AMBULATORY_CARE_PROVIDER_SITE_OTHER): Payer: Medicaid Other

## 2019-02-19 ENCOUNTER — Other Ambulatory Visit: Payer: Self-pay

## 2019-02-19 VITALS — Temp 98.1°F

## 2019-02-19 DIAGNOSIS — M7661 Achilles tendinitis, right leg: Secondary | ICD-10-CM | POA: Diagnosis not present

## 2019-02-19 DIAGNOSIS — L03119 Cellulitis of unspecified part of limb: Secondary | ICD-10-CM

## 2019-02-19 DIAGNOSIS — L02619 Cutaneous abscess of unspecified foot: Secondary | ICD-10-CM

## 2019-02-19 MED ORDER — DOXYCYCLINE HYCLATE 100 MG PO TABS: 100.0000 mg | ORAL_TABLET | Freq: Two times a day (BID) | ORAL | 0 refills | Status: DC

## 2019-02-19 MED ORDER — GENTAMICIN SULFATE 0.1 % EX CREA: 1.0000 "application " | TOPICAL_CREAM | Freq: Two times a day (BID) | CUTANEOUS | 1 refills | Status: DC

## 2019-02-21 NOTE — Progress Notes (Signed)
   Subjective:  Patient presents today status post plantar posterior heel resection right foot with repair of Achilles tendon right. DOS: 12/07/2018. She reports continue drainage from the incision site. She has been using the CAM boot and taking Percocet as directed for treatment. There are no modifying factors noted. Patient is here for further evaluation and treatment.   Past Medical History:  Diagnosis Date  . Anxiety   . COLD SORE 05/15/2010  . DEPRESSION 12/12/2009  . Diabetes mellitus without complication ([place])   . High cholesterol   . 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 HYPERTENSION 12/12/2009  . TOBACCO ABUSE 12/12/2009      Objective/Physical Exam Neurovascular status intact. Capillary refill within normal limits all digits. Moderate edema noted to the surgical extremity. Negative for any erythema. Small area of dehiscence noted to the right medial incision.   Radiographic Exam:  Normal osseous mineralization. Joint spaces preserved. No fracture/dislocation/boney destruction.    Assessment: 1. s/p posterior and plantar heel spur resection with repair of Achilles tendon right. DOS: 12/07/2018   Plan of Care:  1. Patient was evaluated. X-Rays reviewed.  2. Light debridement performed with tissue nipper. Dry sterile dressing applied.  3. Culture taken from wound.  4. Prescription for Doxycycline #14 provided to patient.  5. Prescription for Gentamicin cream provided to patient to use daily with a bandage.  6. Transition from CAM boot into good sneakers.  7. Return to clinic in 6 weeks.   Edrick Kins, DPM [facility]  Dr. Edrick Kins, [address]                                        [place], [place] [address]                Office [phone]  Fax [phone]

## 2019-02-22 ENCOUNTER — Ambulatory Visit (HOSPITAL_COMMUNITY): Payer: Medicaid Other | Admitting: Psychiatry

## 2019-02-22 LAB — WOUND CULTURE
MICRO NUMBER:: 593791
SPECIMEN QUALITY:: ADEQUATE

## 2019-02-26 ENCOUNTER — Telehealth: Payer: Self-pay | Admitting: Podiatry

## 2019-02-26 NOTE — Telephone Encounter (Signed)
Pt called stating she received a MyChart message that her results were in from her previous testing and would like to know what they are. Please give patient a call.

## 2019-02-28 MED ORDER — DOXYCYCLINE HYCLATE 100 MG PO TABS: 100.0000 mg | ORAL_TABLET | Freq: Two times a day (BID) | ORAL | 0 refills | Status: DC

## 2019-02-28 NOTE — Telephone Encounter (Signed)
Spoke with patient regarding culture results. Wound has improved with minimal drainage. Improved erythema but there is still some redness around the incision site.   Refill Doxycycline 100mg  #20 BID.   RTC next scheduled appt.   - Dr. Amalia Hailey

## 2019-02-28 NOTE — Addendum Note (Signed)
Addended by: Edrick Kins on: 02/28/2019 06:48 PM   Modules accepted: Orders
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
## 2019-03-07 ENCOUNTER — Encounter (HOSPITAL_COMMUNITY): Payer: Self-pay | Admitting: Psychiatry

## 2019-03-07 ENCOUNTER — Ambulatory Visit (INDEPENDENT_AMBULATORY_CARE_PROVIDER_SITE_OTHER): Payer: Medicaid Other | Admitting: Psychiatry

## 2019-03-07 ENCOUNTER — Other Ambulatory Visit: Payer: Self-pay

## 2019-03-07 DIAGNOSIS — F331 Major depressive disorder, recurrent, moderate: Secondary | ICD-10-CM | POA: Diagnosis not present

## 2019-03-07 DIAGNOSIS — F4310 Post-traumatic stress disorder, unspecified: Secondary | ICD-10-CM

## 2019-03-07 MED ORDER — VENLAFAXINE HCL ER 37.5 MG PO CP24: ORAL_CAPSULE | ORAL | 0 refills | Status: DC

## 2019-03-07 MED ORDER — VENLAFAXINE HCL ER 150 MG PO CP24: 150.0000 mg | ORAL_CAPSULE | Freq: Every day | ORAL | 0 refills | Status: DC

## 2019-03-07 NOTE — Patient Instructions (Addendum)
1. Change medication as follows:  Week 1: Decrease sertraline 100 mg daily, start venlafaxine 37.5 mg daily Week 2: Decrease sertraline 50 mg daily, increase venlafaxine 75 mg daily  Week 3: Discontinue sertraline, increase venlafaxine 150 mg daily  2. Next appointment: 8/5 at 11 AM

## 2019-03-08 ENCOUNTER — Encounter: Payer: Self-pay | Admitting: *Deleted

## 2019-03-13 ENCOUNTER — Encounter (HOSPITAL_COMMUNITY): Payer: Self-pay | Admitting: Psychiatry

## 2019-03-13 ENCOUNTER — Other Ambulatory Visit: Payer: Self-pay

## 2019-03-13 ENCOUNTER — Ambulatory Visit (INDEPENDENT_AMBULATORY_CARE_PROVIDER_SITE_OTHER): Payer: Medicaid Other | Admitting: Psychiatry

## 2019-03-13 DIAGNOSIS — F4310 Post-traumatic stress disorder, unspecified: Secondary | ICD-10-CM

## 2019-03-13 DIAGNOSIS — F331 Major depressive disorder, recurrent, moderate: Secondary | ICD-10-CM
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
## 2019-03-20 ENCOUNTER — Other Ambulatory Visit: Payer: Self-pay

## 2019-03-20 ENCOUNTER — Ambulatory Visit (INDEPENDENT_AMBULATORY_CARE_PROVIDER_SITE_OTHER): Payer: Medicaid Other | Admitting: Family Medicine

## 2019-03-20 ENCOUNTER — Encounter: Payer: Self-pay | Admitting: Family Medicine

## 2019-03-20 DIAGNOSIS — K117 Disturbances of salivary secretion: Secondary | ICD-10-CM

## 2019-03-20 DIAGNOSIS — R682 Dry mouth, unspecified: Secondary | ICD-10-CM | POA: Diagnosis not present

## 2019-03-20 MED ORDER — SALIVASURE MT LOZG: 1.0000 | LOZENGE | OROMUCOSAL | 3 refills | Status: AC | PRN

## 2019-03-20 NOTE — Progress Notes (Addendum)
Virtual Visit via telephone Note Due to COVID-19 pandemic this visit was conducted virtually. This visit type was conducted due to national recommendations for restrictions regarding the COVID-19 Pandemic (e.g. social distancing, sheltering in place) in an effort to limit this patient's exposure and mitigate transmission in our community. All issues noted in this document were discussed and addressed.  A physical exam was not performed with this format.   I connected with [name] on 03/20/19 at 25 by telephone and verified that I am speaking with the correct person using two identifiers. [name] is currently located at home and family is currently with them during visit. The provider, Monia Pouch, FNP is located in their office at time of visit.  I discussed the limitations, risks, security and privacy concerns of performing an evaluation and management service by telephone and the availability of in person appointments. I also discussed with the patient that there may be a patient responsible charge related to this service. The patient expressed understanding and agreed to proceed.  Subjective:  Patient ID: Lisa Norton, female    DOB: 09/29/1970, 48 y.o.   MRN: [id number]  Chief Complaint:  Dry Mouth   HPI: Lisa Norton is a 48 y.o. female presenting on 03/20/2019 for Dry Mouth   Pt reports she recently started Effexor. States she has had severe dry mouth since starting this medication. States her mouth is so dry the sides are cracking. She has been using lozenges without relief of symptoms. She has been applying lip moisturizer without relief of symptoms. No abnormal taste. No other associated symptoms.     Relevant past medical, surgical, family, and social history reviewed and updated as indicated.  Allergies and medications reviewed and updated.   Past Medical History:  Diagnosis Date  . Anxiety   . COLD SORE 05/15/2010  . DEPRESSION 12/12/2009  . Diabetes  mellitus without complication ([place])   . High cholesterol   . 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 HYPERTENSION 12/12/2009  . TOBACCO ABUSE 12/12/2009    Past Surgical History:  Procedure Laterality Date  . BLADDER SUSPENSION    . CERVICAL FUSION    . CESAREAN SECTION WITH BILATERAL TUBAL LIGATION Bilateral 02/12/2015   Procedure: CESAREAN SECTION;  Surgeon: Florian Buff, MD;  Location: [facility];  Service: Obstetrics;  Laterality: Bilateral;  Fetal Demise  . CHOLECYSTECTOMY  2006  . EVALUATION UNDER ANESTHESIA WITH HEMORRHOIDECTOMY N/A 10/12/2018   Procedure: LATERAL INTERNAL HEMORRHOID LIGATION/PEXY ANORECTAL EXAM UNDER ANESTHESIA WITH HEMORRHOIDECTOMY X2;  Surgeon: Michael Boston, MD;  Location: [facility];  Service: General;  Laterality: N/A;  . FOOT SURGERY Right    Plantar, bone spurs  . FOOT SURGERY  2019 and 2020  . LAPAROSCOPIC VAGINAL HYSTERECTOMY WITH SALPINGECTOMY  02/16/2018   [facility]  . TONSILLECTOMY  1988    Social History   Socioeconomic History  . Marital status: Legally Separated    Spouse name: Not on file  . Number of children: 2  . Years of education: Not on file  . Highest education level: Not on file  Occupational History  . Not on file  Social Needs  . Financial resource strain: Not on file  . Food insecurity    Worry: Not on file    Inability: Not on file  . Transportation needs    Medical: Not on file    Non-medical: Not on file  Tobacco Use  . Smoking status: Current Every Day Smoker  Packs/day: 1.50    Years: 20.00    Pack years: 30.00    Types: Cigarettes  . Smokeless tobacco: Former Systems developer    Types: Snuff    Quit date: 05/05/1986  Substance and Sexual Activity  . Alcohol use: Yes    Comment: had a bout of heavy drinking 2015, drinks occasionally now   . Drug use: Yes    Comment: past use of marijuana about every other day for two years, last used 25 years ago.   Marland Kitchen Sexual activity: Yes    Birth control/protection: None,  Surgical    Comment: pt states she did not have a tubal  Lifestyle  . Physical activity    Days per week: Not on file    Minutes per session: Not on file  . Stress: Not on file  Relationships  . Social Herbalist on phone: Not on file    Gets together: Not on file    Attends religious service: Not on file    Active member of club or organization: Not on file    Attends meetings of clubs or organizations: Not on file    Relationship status: Not on file  . Intimate partner violence    Fear of current or ex partner: Not on file    Emotionally abused: Not on file    Physically abused: Not on file    Forced sexual activity: Not on file  Other Topics Concern  . Not on file  Social History Narrative  . Not on file    Outpatient Encounter Medications as of 03/20/2019  Medication Sig  . Accu-Chek FastClix Lancets MISC 4 TIMES DAILY  . ACCU-CHEK GUIDE test strip 4 TIMES DAILY  . Artificial Saliva (SALIVASURE) LOZG Use as directed 1 lozenge in the mouth or throat every hour as needed.  . cetirizine (ZYRTEC) 10 MG tablet Take 1 tablet (10 mg total) by mouth daily.  . cyclobenzaprine (FLEXERIL) 10 MG tablet Take 1 tablet (10 mg total) by mouth 3 (three) times daily as needed for muscle spasms.  . dapagliflozin propanediol (FARXIGA) 10 MG TABS tablet Take 10 mg by mouth daily.  Marland Kitchen doxycycline (VIBRA-TABS) 100 MG tablet Take 1 tablet (100 mg total) by mouth 2 (two) times daily.  Marland Kitchen EPINEPHrine (EPIPEN 2-PAK) 0.3 mg/0.3 mL IJ SOAJ injection Inject 0.3 mLs (0.3 mg total) into the muscle as needed for anaphylaxis.  . famotidine (PEPCID) 20 MG tablet Take 1 tablet (20 mg total) by mouth 2 (two) times daily.  . fluticasone (FLONASE) 50 MCG/ACT nasal spray Place 2 sprays into both nostrils daily.  Marland Kitchen gabapentin (NEURONTIN) 300 MG capsule Take 1 capsule (300 mg total) by mouth 2 (two) times daily. Increase to 4x/day as needed  . gentamicin cream (GARAMYCIN) 0.1 % Apply 1 application topically  2 (two) times daily.  . hydrOXYzine (VISTARIL) 50 MG capsule TAKE 2 CAPSULES 3 TIMES DAILY AS NEEDED  . Icosapent Ethyl 1 g CAPS Take 2 capsules (2 g total) by mouth 2 (two) times daily with a meal for 30 days.  . insulin detemir (LEVEMIR) 100 UNIT/ML injection INJECT 0.2ML (20 UNITS TOTAL) SQ AT BEDTIME  . levocetirizine (XYZAL) 5 MG tablet Take 1 tablet (5 mg total) by mouth every evening.  . linaclotide (LINZESS) 72 MCG capsule Take 72 mcg by mouth daily before breakfast.  . liraglutide (VICTOZA) 18 MG/3ML SOPN Inject 0.2 mLs (1.2 mg total) into the skin at bedtime.  Marland Kitchen loratadine (CLARITIN) 10 MG tablet  Take 10 mg by mouth daily.  . magnesium hydroxide (MILK OF MAGNESIA) 400 MG/5ML suspension Take 5-10 mLs by mouth daily as needed for mild constipation.  Marland Kitchen oxyCODONE-acetaminophen (PERCOCET) 10-325 MG tablet Take 1 tablet by mouth every 6 (six) hours as needed for pain. (Patient not taking: Reported on 03/13/2019)  . polyethylene glycol powder (GLYCOLAX/MIRALAX) powder Take 17 g by mouth 2 (two) times daily as needed. (Patient taking differently: Take 17 g by mouth 2 (two) times daily as needed for moderate constipation. )  . pravastatin (PRAVACHOL) 80 MG tablet TAKE ONE (1) TABLET EACH DAY  . sertraline (ZOLOFT) 100 MG tablet Take 1 tablet (100 mg total) by mouth at bedtime.  . sertraline (ZOLOFT) 100 MG tablet Take 1.5 tablets (150 mg total) by mouth at bedtime.  Marland Kitchen ULTICARE MICRO PEN NEEDLES 32G X 4 MM MISC 3 TIMES DAILY  . venlafaxine XR (EFFEXOR-XR) 150 MG 24 hr capsule Take 1 capsule (150 mg total) by mouth daily with breakfast.  . venlafaxine XR (EFFEXOR-XR) 37.5 MG 24 hr capsule Start 37.5 mg daily for one week, then 75 mg daily for one week   No facility-administered encounter medications on file as of 03/20/2019.     Allergies  Allergen Reactions  . Metformin And Related Diarrhea  . Hydrocodone Itching and Rash    Review of Systems  Constitutional: Negative for activity change,  appetite change, chills, diaphoresis, fatigue, fever and unexpected weight change.  HENT: Positive for mouth sores.   Respiratory: Negative for cough, shortness of breath and wheezing.   Cardiovascular: Negative for chest pain, palpitations and leg swelling.  Gastrointestinal: Negative for abdominal pain, constipation, diarrhea, nausea and vomiting.  Musculoskeletal: Negative for arthralgias and myalgias.  Neurological: Negative for dizziness, weakness, light-headedness and headaches.  Psychiatric/Behavioral: Negative for confusion.  All other systems reviewed and are negative.        Observations/Objective: No vital signs or physical exam, this was a telephone or virtual health encounter.  Pt alert and oriented, answers all questions appropriately, and able to speak in full sentences.    Assessment and Plan: Jurnie was seen today for dry mouth.  Diagnoses and all orders for this visit:  Xerostomia Dry mouth likely due to Effexor. Pt will follow up with psychiatry to discuss changing medications. Symptomatic care discussed. Biotene Dry Mouth rinse twice daily. Possible cheilitis, pt to trial B complex vitamin daily and folic acid. Report any new or worsening symptoms. Salivasure lozenges every hour as needed.   -     Artificial Saliva (SALIVASURE) LOZG; Use as directed 1 lozenge in the mouth or throat every hour as needed.     Follow Up Instructions: Return if symptoms worsen or fail to improve.    I discussed the assessment and treatment plan with the patient. The patient was provided an opportunity to ask questions and all were answered. The patient agreed with the plan and demonstrated an understanding of the instructions.   The patient was advised to call back or seek an in-person evaluation if the symptoms worsen or if the condition fails to improve as anticipated.  The above assessment and management plan was discussed with the patient. The patient verbalized  understanding of and has agreed to the management plan. Patient is aware to call the clinic if symptoms persist or worsen. Patient is aware when to return to the clinic for a follow-up visit. Patient educated on when it is appropriate to go to the emergency department.    I  provided 15 minutes of non-face-to-face time during this encounter. The call started at 1320. The call ended at 1335. The other time was used for coordination of care.    Monia Pouch, FNP-C [facility] [address] [place],  [address] [phone] 03/20/19

## 2019-03-21 ENCOUNTER — Telehealth (HOSPITAL_COMMUNITY): Payer: Self-pay | Admitting: *Deleted

## 2019-03-21 ENCOUNTER — Other Ambulatory Visit (HOSPITAL_COMMUNITY): Payer: Self-pay | Admitting: Psychiatry

## 2019-03-21 MED ORDER — FLUOXETINE HCL 10 MG PO CAPS: ORAL_CAPSULE | ORAL | 0 refills | Status: DC

## 2019-03-21 NOTE — Telephone Encounter (Signed)
Sertraline 50 mg . Patient is willing to try Fluoxetine

## 2019-03-21 NOTE — Telephone Encounter (Signed)
Dr Modesta Messing Patient called stating the the Effexor is causing her severe dry mouth & that the corner of her lips are severely cracked that she is using A&D ointment. She was so concerned she called her PCP which informed that Effexor can cause dry mouth. Patient has stopped taking & would like to try something different.

## 2019-03-21 NOTE — Telephone Encounter (Signed)
Attempted call to patient & LVM for call back. Called patient's Rx  & left the following per provider Dr. Modesta Messing Advise her to first continue sertraline 50 mg daily only until her dry mouth resolves. After improvement in her symptoms, advise her the following: Week 1. Continue sertraline 50 mg daily. Start fluoxetine 10 mg daily  Week 2: Discontinue sertraline, increase fluoxetine 20 mg daily.  Medication ordered to the pharmacy.

## 2019-03-21 NOTE — Telephone Encounter (Signed)
Ask if she is interested in taking fluoxetine (another antidepressant). Also ask what dose of sertraline she is taking now.

## 2019-03-21 NOTE — Telephone Encounter (Signed)
Advise her to first continue sertraline 50 mg daily only until her dry mouth resolves. After improvement in her symptoms, advise her the following: Week 1. Continue sertraline 50 mg daily. Start fluoxetine 10 mg daily  Week 2: Discontinue sertraline, increase fluoxetine 20 mg daily.  Medication ordered to the pharmacy.

## 2019-03-27 ENCOUNTER — Other Ambulatory Visit: Payer: Self-pay

## 2019-03-27 ENCOUNTER — Ambulatory Visit: Payer: Medicaid Other | Admitting: Family Medicine

## 2019-03-28 ENCOUNTER — Other Ambulatory Visit: Payer: Self-pay | Admitting: Family Medicine

## 2019-03-28 ENCOUNTER — Ambulatory Visit (INDEPENDENT_AMBULATORY_CARE_PROVIDER_SITE_OTHER): Payer: Medicaid Other | Admitting: Psychiatry

## 2019-03-28 ENCOUNTER — Telehealth: Payer: Self-pay | Admitting: *Deleted

## 2019-03-28 DIAGNOSIS — F4310 Post-traumatic stress disorder, unspecified: Secondary | ICD-10-CM | POA: Diagnosis not present

## 2019-03-28 DIAGNOSIS — F331 Major depressive disorder, recurrent, moderate: Secondary | ICD-10-CM | POA: Diagnosis not present

## 2019-03-28 NOTE — Telephone Encounter (Signed)
She is already on high dose statin and using gemfibrozil or fenofibrate is not recommended due to increased myalgias and arthralgias. She can use over the counter omega 3 fatty acids. Please let her know to do that. Take them twice daily with meals.

## 2019-03-28 NOTE — Telephone Encounter (Signed)
Prior Auth for [employer]  PA# [id number]  NCTracks

## 2019-03-28 NOTE — Telephone Encounter (Signed)
Vascepa non preferred by insurance.   Pt must try and fail both Gemfibrozil and Fenofibrate
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
## 2019-03-30 NOTE — Telephone Encounter (Signed)
Thank you so much for your hard work

## 2019-03-30 NOTE — Telephone Encounter (Signed)
Prior Auth for [facility]-APPROVED 03/28/19-03/22/20  PA# [id number] [place] aware.

## 2019-04-02 ENCOUNTER — Ambulatory Visit: Payer: Medicaid Other | Admitting: Podiatry

## 2019-04-02 ENCOUNTER — Encounter: Payer: Self-pay | Admitting: Podiatry

## 2019-04-02 ENCOUNTER — Other Ambulatory Visit: Payer: Self-pay

## 2019-04-02 VITALS — Temp 97.7°F

## 2019-04-02 DIAGNOSIS — M7732 Calcaneal spur, left foot: Secondary | ICD-10-CM

## 2019-04-02 DIAGNOSIS — Z9889 Other specified postprocedural states: Secondary | ICD-10-CM

## 2019-04-02 DIAGNOSIS — M7662 Achilles tendinitis, left leg: Secondary | ICD-10-CM | POA: Diagnosis not present

## 2019-04-02 DIAGNOSIS — M7661 Achilles tendinitis, right leg: Secondary | ICD-10-CM

## 2019-04-02 DIAGNOSIS — M722 Plantar fascial fibromatosis: Secondary | ICD-10-CM

## 2019-04-02 NOTE — Patient Instructions (Signed)
Pre-Operative Instructions  Congratulations, you have decided to take an important step towards improving your quality of life.  You can be assured that the doctors and staff at [facility] will be with you every step of the way.  Here are some important things you should know:  1. Plan to be at the surgery center/hospital at least 1 (one) hour prior to your scheduled time, unless otherwise directed by the surgical center/hospital staff.  You must have a responsible adult accompany you, remain during the surgery and drive you home.  Make sure you have directions to the surgical center/hospital to ensure you arrive on time. 2. If you are having surgery at [facility] or [facility], you will need a copy of your medical history and physical form from your family physician within one month prior to the date of surgery. We will give you a form for your primary physician to complete.  3. We make every effort to accommodate the date you request for surgery.  However, there are times where surgery dates or times have to be moved.  We will contact you as soon as possible if a change in schedule is required.   4. No aspirin/ibuprofen for one week before surgery.  If you are on aspirin, any non-steroidal anti-inflammatory medications (Mobic, Aleve, Ibuprofen) should not be taken seven (7) days prior to your surgery.  You make take Tylenol for pain prior to surgery.  5. Medications - If you are taking daily heart and blood pressure medications, seizure, reflux, allergy, asthma, anxiety, pain or diabetes medications, make sure you notify the surgery center/hospital before the day of surgery so they can tell you which medications you should take or avoid the day of surgery. 6. No food or drink after midnight the night before surgery unless directed otherwise by surgical center/hospital staff. 7. No alcoholic beverages 24-hours prior to surgery.  No smoking 24-hours prior or 24-hours after  surgery. 8. Wear loose pants or shorts. They should be loose enough to fit over bandages, boots, and casts. 9. Don't wear slip-on shoes. Sneakers are preferred. 10. Bring your boot with you to the surgery center/hospital.  Also bring crutches or a walker if your physician has prescribed it for you.  If you do not have this equipment, it will be provided for you after surgery. 11. If you have not been contacted by the surgery center/hospital by the day before your surgery, call to confirm the date and time of your surgery. 12. Leave-time from work may vary depending on the type of surgery you have.  Appropriate arrangements should be made prior to surgery with your employer. 13. Prescriptions will be provided immediately following surgery by your doctor.  Fill these as soon as possible after surgery and take the medication as directed. Pain medications will not be refilled on weekends and must be approved by the doctor. 14. Remove nail polish on the operative foot and avoid getting pedicures prior to surgery. 15. Wash the night before surgery.  The night before surgery wash the foot and leg well with water and the antibacterial soap provided. Be sure to pay special attention to beneath the toenails and in between the toes.  Wash for at least three (3) minutes. Rinse thoroughly with water and dry well with a towel.  Perform this wash unless told not to do so by your physician.  Enclosed: 1 Ice pack (please put in freezer the night before surgery)   1 Hibiclens skin cleaner     Pre-op instructions  If you have any questions regarding the instructions, please do not hesitate to call our office.  [place]: [address], [place], [place] [address] -- [phone]  [place]: [address] [place], [place] [address] -- [phone]  [place]: [address]  [place], [place] [address] -- [phone]  [place]: [address], [address], [place], [place] [address] -- [phone]  Website:  [email or web address] 

## 2019-04-03 ENCOUNTER — Encounter: Payer: Self-pay | Admitting: Family

## 2019-04-03 ENCOUNTER — Other Ambulatory Visit: Payer: Self-pay

## 2019-04-03 ENCOUNTER — Ambulatory Visit (INDEPENDENT_AMBULATORY_CARE_PROVIDER_SITE_OTHER): Payer: Medicaid Other | Admitting: Family

## 2019-04-03 VITALS — BP 130/82 | HR 96 | Temp 98.7°F | Ht 61.0 in | Wt 218.4 lb

## 2019-04-03 DIAGNOSIS — K130 Diseases of lips: Secondary | ICD-10-CM | POA: Diagnosis not present

## 2019-04-03 MED ORDER — MUPIROCIN 2 % EX OINT: 1.0000 "application " | TOPICAL_OINTMENT | Freq: Two times a day (BID) | CUTANEOUS | 0 refills | Status: DC

## 2019-04-03 MED ORDER — FLUCONAZOLE 150 MG PO TABS: 150.0000 mg | ORAL_TABLET | ORAL | 0 refills | Status: DC | PRN

## 2019-04-03 MED ORDER — CLOTRIMAZOLE 1 % EX CREA: 1.0000 "application " | TOPICAL_CREAM | Freq: Two times a day (BID) | CUTANEOUS | 0 refills | Status: DC

## 2019-04-03 NOTE — Patient Instructions (Signed)
ANGULAR CHEILITIS-Angular cheilitis, also known as perlche, is an acute or chronic inflammation of the skin and contiguous labial mucosa located at the lateral commissures of the mouth. It typically presents with erythema, maceration, scaling, and fissuring at the corners of the mouth (picture 2A-B). Lesions are most often bilateral and may be painful. Angular cheilitis is caused by excessive moisture and maceration from saliva and secondary infection with Candida albicans or, less commonly, Staphylococcus aureus [25-27]. Angular cheilitis may occur at any age without sex predilection but is especially common in older individuals wearing dentures. Predisposing local factors include wearing orthodontic appliances or ill-fitting dentures, sicca symptoms (dry mouth), intraoral fungal infection, poor oral hygiene, and age-related anatomic changes of the mouth due to reduced vertical facial dimensions [25,28-30].  In older individuals, the loss of vertical dimension of the mouth due to recession of the alveolar ridges or edentulous state leads to drooping of the corners of the mouth, drooling, and retention of saliva in the creases [1,25,29]. Drooling, thumb sucking, and lip licking are frequent causes of angular cheilitis in young children [25,28-30].  Less common causes in both adults and children include nutritional deficiencies, such as B9 (folic acid), zinc, B6 (pyridoxine), B2 (riboflavin), or B3 (niacin) deficiency. Other causes include type 2 diabetes, Sjgren syndrome, immunodeficiency, irritant or allergic reactions to oral hygiene products or denture materials, and medications causing dryness and xerostomia (eg, isotretinoin, acitretin)   Treatment-The management of angular cheilitis involves the control of local predisposing factors and treatment of fungal or bacterial superinfection. General measures to reduce moisture pooling at the corners of the mouth include: ?Improving denture fit and  cleaning ?Maintaining optimal oral hygiene ?Treating sicca symptoms (dry mouth) (see "Treatment of dry mouth and other non-ocular sicca symptoms in Sjgren's syndrome", section on 'Treatment of dry mouth') ?Use of barrier creams (eg, zinc oxide paste) or petrolatum

## 2019-04-03 NOTE — Progress Notes (Signed)
Subjective:    Patient ID: Lisa Norton, female    DOB: 14-Jun-1971, 48 y.o.   MRN: [id number]  Chief Complaint  Patient presents with  . Blister    corners of mouth & dryness    Rash This is a new problem. The current episode started 1 to 4 weeks ago. The problem has been gradually worsening since onset. Location: in bilateral corners in her mouth. The rash is characterized by dryness and pain. She was exposed to a new medication. Pertinent negatives include no congestion, cough, diarrhea, fatigue, fever or sore throat. Past treatments include anti-itch cream (A&D, cocnut oil). The treatment provided no relief.      Review of Systems  Constitutional: Negative for fatigue and fever.  HENT: Negative for congestion and sore throat.   Respiratory: Negative for cough.   Gastrointestinal: Negative for diarrhea.  Skin: Positive for rash.  All other systems reviewed and are negative.      Objective:   Physical Exam Vitals signs reviewed.  Constitutional:      General: She is not in acute distress.    Appearance: She is well-developed.  HENT:     Head: Normocephalic and atraumatic.      Comments: Bilateral corners of mouth cracked and scabbed, dry mouth present Eyes:     Pupils: Pupils are equal, round, and reactive to light.  Neck:     Musculoskeletal: Normal range of motion and neck supple.     Thyroid: No thyromegaly.  Cardiovascular:     Rate and Rhythm: Normal rate and regular rhythm.     Heart sounds: Normal heart sounds. No murmur.  Pulmonary:     Effort: Pulmonary effort is normal. No respiratory distress.     Breath sounds: Normal breath sounds. No wheezing.  Abdominal:     General: Bowel sounds are normal. There is no distension.     Palpations: Abdomen is soft.     Tenderness: There is no abdominal tenderness.  Musculoskeletal: Normal range of motion.        General: No tenderness.  Skin:    General: Skin is warm and dry.  Neurological:     Mental Status:  She is alert and oriented to person, place, and time.     Cranial Nerves: No cranial nerve deficit.     Deep Tendon Reflexes: Reflexes are normal and symmetric.  Psychiatric:        Behavior: Behavior normal.        Thought Content: Thought content normal.        Judgment: Judgment normal.       BP 130/82   Pulse 96   Temp 98.7 F (37.1 C) (Oral)   Ht 5\' 1"  (1.549 m)   Wt 218 lb 6.4 oz (99.1 kg)   LMP 09/07/2017   BMI 41.27 kg/m      Assessment & Plan:  Lisa Norton comes in today with chief complaint of Blister (corners of mouth & dryness)   Diagnosis and orders addressed:  1. Angular cheilitis Keep area clean and dry Keep mouth wet and avoid drying out  You can continues to use OTC biotene Call office if symptoms worsen or do not improve  - clotrimazole (CLOTRIMAZOLE ATHLETES FOOT) 1 % cream; Apply 1 application topically 2 (two) times daily.  Dispense: 30 g; Refill: 0 - fluconazole (DIFLUCAN) 150 MG tablet; Take 1 tablet (150 mg total) by mouth every three (3) days as needed.  Dispense: 3 tablet; Refill: 0 -  mupirocin ointment (BACTROBAN) 2 %; Apply 1 application topically 2 (two) times daily.  Dispense: 22 g; Refill: 0  Lisa Dun, FNP

## 2019-04-04 ENCOUNTER — Encounter (HOSPITAL_COMMUNITY): Payer: Self-pay | Admitting: Psychiatry

## 2019-04-04 ENCOUNTER — Ambulatory Visit (INDEPENDENT_AMBULATORY_CARE_PROVIDER_SITE_OTHER): Payer: Medicaid Other | Admitting: Psychiatry

## 2019-04-04 DIAGNOSIS — F331 Major depressive disorder, recurrent, moderate: Secondary | ICD-10-CM

## 2019-04-04 DIAGNOSIS — F419 Anxiety disorder, unspecified: Secondary | ICD-10-CM | POA: Diagnosis not present

## 2019-04-04 DIAGNOSIS — G4700 Insomnia, unspecified: Secondary | ICD-10-CM

## 2019-04-04 DIAGNOSIS — F4310 Post-traumatic stress disorder, unspecified: Secondary | ICD-10-CM

## 2019-04-04 NOTE — Patient Instructions (Addendum)
1. Please try the following after your oral symptom is resolved. Week 1. Continue sertraline 50 mg daily. Start fluoxetine 10 mg daily  Week 2: Discontinue sertraline, increase fluoxetine 20 mg daily.  2. Next appointment:  9/22 at 10 AM

## 2019-04-04 NOTE — Progress Notes (Signed)
   Subjective:  Patient presents today status post plantar posterior heel resection right foot with repair of Achilles tendon right. DOS: 12/07/2018. She states she is doing well. She denies any significant pain or modifying factors. She has been wearing tennis shoes without issue. Patient is here for further evaluation and treatment.   Past Medical History:  Diagnosis Date  . Anxiety   . COLD SORE 05/15/2010  . DEPRESSION 12/12/2009  . Diabetes mellitus without complication ([place])   . High cholesterol   . 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 HYPERTENSION 12/12/2009  . TOBACCO ABUSE 12/12/2009      Objective/Physical Exam Neurovascular status intact.  Skin incisions appear to be well coapted. No sign of infectious process noted. No dehiscence. No active bleeding noted. Moderate edema noted to the surgical extremity. Pain on palpation noted to the posterior tubercle of the left calcaneus at the insertion of the Achilles tendon consistent with retrocalcaneal bursitis.   Assessment: 1. s/p posterior and plantar heel spur resection with repair of Achilles tendon right. DOS: 12/07/2018 2. Plantar and posterior heel spurs left 3. Achilles tendinitis left    Plan of Care:  1. Patient was evaluated.  2. Today we discussed the conservative versus surgical management of the presenting pathology. The patient opts for surgical management. All possible complications and details of the procedure were explained. All patient questions were answered. No guarantees were expressed or implied. 3. Authorization for surgery was initiated today. Surgery will consist of retrocalcaneal exostectomy with Achilles repair left; plantar heel spur resection left.  4. May resume full activity with no restrictions regarding RLE.  5. Return to clinic one week post op.     Edrick Kins, DPM [facility]  Dr. Edrick Kins, [address]                                        [place], [place] [address]                 Office [phone]  Fax [phone]

## 2019-04-12 ENCOUNTER — Ambulatory Visit (INDEPENDENT_AMBULATORY_CARE_PROVIDER_SITE_OTHER): Payer: Medicaid Other | Admitting: Psychiatry

## 2019-04-12 ENCOUNTER — Other Ambulatory Visit: Payer: Self-pay

## 2019-04-12 DIAGNOSIS — F331 Major depressive disorder, recurrent, moderate: Secondary | ICD-10-CM | POA: Diagnosis not present

## 2019-04-12 DIAGNOSIS — F4310 Post-traumatic stress disorder, unspecified: Secondary | ICD-10-CM | POA: Diagnosis not present
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
## 2019-04-24 ENCOUNTER — Encounter: Payer: Medicaid Other | Admitting: Family Medicine

## 2019-04-24 ENCOUNTER — Other Ambulatory Visit: Payer: Self-pay

## 2019-04-25 ENCOUNTER — Other Ambulatory Visit: Payer: Self-pay | Admitting: Podiatry

## 2019-04-25 ENCOUNTER — Other Ambulatory Visit: Payer: Self-pay | Admitting: Family Medicine

## 2019-04-25 ENCOUNTER — Ambulatory Visit: Payer: Medicaid Other | Admitting: Family Medicine

## 2019-04-25 ENCOUNTER — Encounter: Payer: Self-pay | Admitting: Family Medicine

## 2019-04-25 ENCOUNTER — Other Ambulatory Visit: Payer: Self-pay

## 2019-04-25 VITALS — BP 118/73 | HR 87 | Temp 97.5°F | Ht 61.0 in | Wt 218.0 lb

## 2019-04-25 DIAGNOSIS — Z130 Encounter for screening for diseases of the blood and blood-forming organs and certain disorders involving the immune mechanism: Secondary | ICD-10-CM

## 2019-04-25 DIAGNOSIS — Z124 Encounter for screening for malignant neoplasm of cervix: Secondary | ICD-10-CM

## 2019-04-25 DIAGNOSIS — E1169 Type 2 diabetes mellitus with other specified complication: Secondary | ICD-10-CM | POA: Diagnosis not present

## 2019-04-25 DIAGNOSIS — Z1239 Encounter for other screening for malignant neoplasm of breast: Secondary | ICD-10-CM

## 2019-04-25 DIAGNOSIS — Z794 Long term (current) use of insulin: Secondary | ICD-10-CM

## 2019-04-25 DIAGNOSIS — Z0001 Encounter for general adult medical examination with abnormal findings: Secondary | ICD-10-CM

## 2019-04-25 DIAGNOSIS — N898 Other specified noninflammatory disorders of vagina: Secondary | ICD-10-CM

## 2019-04-25 DIAGNOSIS — I152 Hypertension secondary to endocrine disorders: Secondary | ICD-10-CM

## 2019-04-25 DIAGNOSIS — E1165 Type 2 diabetes mellitus with hyperglycemia: Secondary | ICD-10-CM

## 2019-04-25 DIAGNOSIS — E785 Hyperlipidemia, unspecified: Secondary | ICD-10-CM

## 2019-04-25 DIAGNOSIS — R748 Abnormal levels of other serum enzymes: Secondary | ICD-10-CM

## 2019-04-25 DIAGNOSIS — E1159 Type 2 diabetes mellitus with other circulatory complications: Secondary | ICD-10-CM | POA: Diagnosis not present

## 2019-04-25 DIAGNOSIS — I10 Essential (primary) hypertension: Secondary | ICD-10-CM

## 2019-04-25 DIAGNOSIS — N816 Rectocele: Secondary | ICD-10-CM

## 2019-04-25 LAB — WET PREP FOR TRICH, YEAST, CLUE
Clue Cell Exam: NEGATIVE
Trichomonas Exam: NEGATIVE
Yeast Exam: NEGATIVE

## 2019-04-25 LAB — BAYER DCA HB A1C WAIVED: HB A1C (BAYER DCA - WAIVED): 8.9 % — ABNORMAL HIGH (ref <7.0)

## 2019-04-25 MED ORDER — LIRAGLUTIDE 18 MG/3ML ~~LOC~~ SOPN: 1.8000 mg | PEN_INJECTOR | Freq: Every day | SUBCUTANEOUS | 2 refills | Status: DC

## 2019-04-25 MED ORDER — VICTOZA 18 MG/3ML ~~LOC~~ SOPN: 1.8000 mg | PEN_INJECTOR | Freq: Every day | SUBCUTANEOUS | 6 refills | Status: DC

## 2019-04-25 NOTE — Patient Instructions (Signed)
Health Maintenance, Female Adopting a healthy lifestyle and getting preventive care are important in promoting health and wellness. Ask your health care provider about:  The right schedule for you to have regular tests and exams.  Things you can do on your own to prevent diseases and keep yourself healthy. What should I know about diet, weight, and exercise? Eat a healthy diet   Eat a diet that includes plenty of vegetables, fruits, low-fat dairy products, and lean protein.  Do not eat a lot of foods that are high in solid fats, added sugars, or sodium. Maintain a healthy weight Body mass index (BMI) is used to identify weight problems. It estimates body fat based on height and weight. Your health care provider can help determine your BMI and help you achieve or maintain a healthy weight. Get regular exercise Get regular exercise. This is one of the most important things you can do for your health. Most adults should:  Exercise for at least 150 minutes each week. The exercise should increase your heart rate and make you sweat (moderate-intensity exercise).  Do strengthening exercises at least twice a week. This is in addition to the moderate-intensity exercise.  Spend less time sitting. Even light physical activity can be beneficial. Watch cholesterol and blood lipids Have your blood tested for lipids and cholesterol at 46 years of age, then have this test every 5 years. Have your cholesterol levels checked more often if:  Your lipid or cholesterol levels are high.  You are older than 46 years of age.  You are at high risk for heart disease. What should I know about cancer screening? Depending on your health history and family history, you may need to have cancer screening at various ages. This may include screening for:  Breast cancer.  Cervical cancer.  Colorectal cancer.  Skin cancer.  Lung cancer. What should I know about heart disease, diabetes, and high blood  pressure? Blood pressure and heart disease  High blood pressure causes heart disease and increases the risk of stroke. This is more likely to develop in people who have high blood pressure readings, are of African descent, or are overweight.  Have your blood pressure checked: ? Every 3-5 years if you are 18-39 years of age. ? Every year if you are 40 years old or older. Diabetes Have regular diabetes screenings. This checks your fasting blood sugar level. Have the screening done:  Once every three years after age 40 if you are at a normal weight and have a low risk for diabetes.  More often and at a younger age if you are overweight or have a high risk for diabetes. What should I know about preventing infection? Hepatitis B If you have a higher risk for hepatitis B, you should be screened for this virus. Talk with your health care provider to find out if you are at risk for hepatitis B infection. Hepatitis C Testing is recommended for:  Everyone born from 1945 through 1965.  Anyone with known risk factors for hepatitis C. Sexually transmitted infections (STIs)  Get screened for STIs, including gonorrhea and chlamydia, if: ? You are sexually active and are younger than 46 years of age. ? You are older than 46 years of age and your health care provider tells you that you are at risk for this type of infection. ? Your sexual activity has changed since you were last screened, and you are at increased risk for chlamydia or gonorrhea. Ask your health care provider if   you are at risk.  Ask your health care provider about whether you are at high risk for HIV. Your health care provider may recommend a prescription medicine to help prevent HIV infection. If you choose to take medicine to prevent HIV, you should first get tested for HIV. You should then be tested every 3 months for as long as you are taking the medicine. Pregnancy  If you are about to stop having your period (premenopausal) and  you may become pregnant, seek counseling before you get pregnant.  Take 400 to 800 micrograms (mcg) of folic acid every day if you become pregnant.  Ask for birth control (contraception) if you want to prevent pregnancy. Osteoporosis and menopause Osteoporosis is a disease in which the bones lose minerals and strength with aging. This can result in bone fractures. If you are 65 years old or older, or if you are at risk for osteoporosis and fractures, ask your health care provider if you should:  Be screened for bone loss.  Take a calcium or vitamin D supplement to lower your risk of fractures.  Be given hormone replacement therapy (HRT) to treat symptoms of menopause. Follow these instructions at home: Lifestyle  Do not use any products that contain nicotine or tobacco, such as cigarettes, e-cigarettes, and chewing tobacco. If you need help quitting, ask your health care provider.  Do not use street drugs.  Do not share needles.  Ask your health care provider for help if you need support or information about quitting drugs. Alcohol use  Do not drink alcohol if: ? Your health care provider tells you not to drink. ? You are pregnant, may be pregnant, or are planning to become pregnant.  If you drink alcohol: ? Limit how much you use to 0-1 drink a day. ? Limit intake if you are breastfeeding.  Be aware of how much alcohol is in your drink. In the [place] one drink equals one 12 oz bottle of beer (355 mL), one 5 oz glass of wine (148 mL), or one 1 oz glass of hard liquor (44 mL). General instructions  Schedule regular health, dental, and eye exams.  Stay current with your vaccines.  Tell your health care provider if: ? You often feel depressed. ? You have ever been abused or do not feel safe at home. Summary  Adopting a healthy lifestyle and getting preventive care are important in promoting health and wellness.  Follow your health care provider's instructions about healthy  diet, exercising, and getting tested or screened for diseases.  Follow your health care provider's instructions on monitoring your cholesterol and blood pressure. This information is not intended to replace advice given to you by your health care provider. Make sure you discuss any questions you have with your health care provider. Document Released: 03/01/2011 Document Revised: 08/09/2018 Document Reviewed: 08/09/2018 [employer] Patient Education  2020 [employer]  

## 2019-04-25 NOTE — Progress Notes (Signed)
Subjective:  Patient ID: Lisa Norton, female    DOB: 18-Nov-1970, 48 y.o.   MRN: [id number]  Patient Care Team: Baruch Gouty, FNP as PCP - General (Family Medicine) Michael Boston, MD as Consulting Physician (General Surgery) Marti Sleigh, MD as Consulting Physician (Gynecology) Jared Cahn, Connye Burkitt, FNP (Family Medicine)   Chief Complaint:  Annual Exam and Gynecologic Exam   HPI: Lisa Norton is a 48 y.o. female presenting on 04/25/2019 for Annual Exam and Gynecologic Exam   1. Encounter for general adult medical examination with abnormal findings  Pt here for annual physical exam with PAP. Pt states she has been doing ok. She has depression. This has been ongoing since the loss of her daughter. She is followed by [facility] and is making some progress. She has filed for disability and is awaiting hearing. She states she is unable to work because of her PTSD and depression.    2. Hyperlipidemia associated with type 2 diabetes mellitus ([place])  Pt is compliant with her statin therapy. She does not follow a strict diet or exercise on a regular basis. She denies denies side effects from the medications.    3. Hypertension associated with type 2 diabetes mellitus ([place])  Well controlled. Compliant with medications without associated side effects. No headaches, leg swelling, chest pain, palpitations, confusion, or weakness. No visual changes.    4. Type 2 diabetes mellitus with hyperglycemia, with long-term current use of insulin (HCC)  Compliant with medications. Does not check blood sugars often. No side effects from medications. Does not follow a strict diet or exercise on a regular basis. No polyuria, polydipsia, or polyphagia.      Relevant past medical, surgical, family, and social history reviewed and updated as indicated.  Allergies and medications reviewed and updated. Date reviewed: Chart in Epic.   Past Medical History:  Diagnosis Date  . Anxiety   . COLD SORE 05/15/2010   . DEPRESSION 12/12/2009  . Diabetes mellitus without complication ([place])   . High cholesterol   . 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 HYPERTENSION 12/12/2009  . TOBACCO ABUSE 12/12/2009    Past Surgical History:  Procedure Laterality Date  . BLADDER SUSPENSION    . CERVICAL FUSION    . CESAREAN SECTION WITH BILATERAL TUBAL LIGATION Bilateral 02/12/2015   Procedure: CESAREAN SECTION;  Surgeon: Florian Buff, MD;  Location: [facility];  Service: Obstetrics;  Laterality: Bilateral;  Fetal Demise  . CHOLECYSTECTOMY  2006  . EVALUATION UNDER ANESTHESIA WITH HEMORRHOIDECTOMY N/A 10/12/2018   Procedure: LATERAL INTERNAL HEMORRHOID LIGATION/PEXY ANORECTAL EXAM UNDER ANESTHESIA WITH HEMORRHOIDECTOMY X2;  Surgeon: Michael Boston, MD;  Location: [facility];  Service: General;  Laterality: N/A;  . FOOT SURGERY Right    [place]antar, bone spurs  . FOOT SURGERY  2019 and 2020  . LAPAROSCOPIC VAGINAL HYSTERECTOMY WITH SALPINGECTOMY  02/16/2018   [facility]  . TONSILLECTOMY  1988    Social History   Socioeconomic History  . Marital status: Legally Separated    Spouse name: Not on file  . Number of children: 2  . Years of education: Not on file  . Highest education level: Not on file  Occupational History  . Not on file  Social Needs  . Financial resource strain: Not on file  . Food insecurity    Worry: Not on file    Inability: Not on file  . Transportation needs    Medical: Not on file  Non-medical: Not on file  Tobacco Use  . Smoking status: Current Every Day Smoker    Packs/day: 1.50    Years: 20.00    Pack years: 30.00    Types: Cigarettes  . Smokeless tobacco: Former Systems developer    Types: Snuff    Quit date: 05/05/1986  Substance and Sexual Activity  . Alcohol use: Yes    Comment: had a bout of heavy drinking 2015, drinks occasionally now   . Drug use: Not Currently    Comment: past use of marijuana about every other day for two years, last used 25 years ago.   Marland Kitchen Sexual  activity: Yes    Birth control/protection: None, Surgical    Comment: pt states she did not have a tubal  Lifestyle  . Physical activity    Days per week: Not on file    Minutes per session: Not on file  . Stress: Not on file  Relationships  . Social Herbalist on phone: Not on file    Gets together: Not on file    Attends religious service: Not on file    Active member of club or organization: Not on file    Attends meetings of clubs or organizations: Not on file    Relationship status: Not on file  . Intimate partner violence    Fear of current or ex partner: Not on file    Emotionally abused: Not on file    Physically abused: Not on file    Forced sexual activity: Not on file  Other Topics Concern  . Not on file  Social History Narrative  . Not on file    Outpatient Encounter Medications as of 04/25/2019  Medication Sig  . Accu-Chek FastClix Lancets MISC 4 TIMES DAILY  . ACCU-CHEK GUIDE test strip 4 TIMES DAILY  . cetirizine (ZYRTEC) 10 MG tablet Take 1 tablet (10 mg total) by mouth daily.  . cyclobenzaprine (FLEXERIL) 10 MG tablet Take 1 tablet (10 mg total) by mouth 3 (three) times daily as needed for muscle spasms.  . dapagliflozin propanediol (FARXIGA) 10 MG TABS tablet Take 10 mg by mouth daily.  . famotidine (PEPCID) 20 MG tablet Take 1 tablet (20 mg total) by mouth 2 (two) times daily.  . fluticasone (FLONASE) 50 MCG/ACT nasal spray Place 2 sprays into both nostrils daily.  Marland Kitchen gabapentin (NEURONTIN) 300 MG capsule Take 1 capsule (300 mg total) by mouth 2 (two) times daily. Increase to 4x/day as needed  . hydrOXYzine (VISTARIL) 50 MG capsule TAKE 2 CAPSULES 3 TIMES DAILY AS NEEDED  . insulin detemir (LEVEMIR) 100 UNIT/ML injection INJECT 0.2ML (20 UNITS TOTAL) SQ AT BEDTIME  . levocetirizine (XYZAL) 5 MG tablet Take 1 tablet (5 mg total) by mouth every evening.  . linaclotide (LINZESS) 72 MCG capsule Take 72 mcg by mouth daily before breakfast.  . loratadine  (CLARITIN) 10 MG tablet Take 10 mg by mouth daily.  . magnesium hydroxide (MILK OF MAGNESIA) 400 MG/5ML suspension Take 5-10 mLs by mouth daily as needed for mild constipation.  . mupirocin ointment (BACTROBAN) 2 % Apply 1 application topically 2 (two) times daily.  . polyethylene glycol powder (GLYCOLAX/MIRALAX) powder Take 17 g by mouth 2 (two) times daily as needed. (Patient taking differently: Take 17 g by mouth 2 (two) times daily as needed for moderate constipation. )  . pravastatin (PRAVACHOL) 80 MG tablet TAKE ONE (1) TABLET EACH DAY  . sertraline (ZOLOFT) 100 MG tablet Take 1 tablet (100  mg total) by mouth at bedtime.  Marland Kitchen ULTICARE MICRO PEN NEEDLES 32G X 4 MM MISC 3 TIMES DAILY  . [DISCONTINUED] liraglutide (VICTOZA) 18 MG/3ML SOPN Inject 0.2 mLs (1.2 mg total) into the skin at bedtime.  . [DISCONTINUED] liraglutide (VICTOZA) 18 MG/3ML SOPN Inject 0.3 mLs (1.8 mg total) into the skin at bedtime.  . clotrimazole (CLOTRIMAZOLE ATHLETES FOOT) 1 % cream Apply 1 application topically 2 (two) times daily. (Patient not taking: Reported on 04/25/2019)  . EPINEPHrine (EPIPEN 2-PAK) 0.3 mg/0.3 mL IJ SOAJ injection Inject 0.3 mLs (0.3 mg total) into the muscle as needed for anaphylaxis. (Patient not taking: Reported on 04/25/2019)  . FLUoxetine (PROZAC) 10 MG capsule 10 mg daily for one week, then 20 mg daily (Patient not taking: Reported on 04/25/2019)  . liraglutide (VICTOZA) 18 MG/3ML SOPN Inject 0.3 mLs (1.8 mg total) into the skin daily.  . [DISCONTINUED] fluconazole (DIFLUCAN) 150 MG tablet Take 1 tablet (150 mg total) by mouth every three (3) days as needed.  . [DISCONTINUED] Icosapent Ethyl 1 g CAPS Take 2 capsules (2 g total) by mouth 2 (two) times daily with a meal for 30 days.  . [DISCONTINUED] oxyCODONE-acetaminophen (PERCOCET) 10-325 MG tablet Take 1 tablet by mouth every 6 (six) hours as needed for pain.   No facility-administered encounter medications on file as of 04/25/2019.      Allergies  Allergen Reactions  . Metformin And Related Diarrhea    Review of Systems  Constitutional: Negative for activity change, appetite change, chills, diaphoresis, fatigue, fever and unexpected weight change.  HENT: Negative.   Eyes: Negative.  Negative for photophobia and visual disturbance.  Respiratory: Negative for cough, chest tightness, shortness of breath and wheezing.   Cardiovascular: Negative for chest pain, palpitations and leg swelling.  Gastrointestinal: Positive for rectal pain. Negative for abdominal distention, abdominal pain, anal bleeding, blood in stool, constipation, diarrhea, nausea and vomiting.  Endocrine: Negative.  Negative for cold intolerance, heat intolerance, polydipsia, polyphagia and polyuria.  Genitourinary: Positive for vaginal discharge. Negative for decreased urine volume, difficulty urinating, dyspareunia, dysuria, enuresis, flank pain, frequency, genital sores, hematuria, pelvic pain, urgency, vaginal bleeding and vaginal pain.  Musculoskeletal: Negative for arthralgias, back pain, gait problem, joint swelling, myalgias, neck pain and neck stiffness.  Skin: Negative.   Allergic/Immunologic: Negative.   Neurological: Negative for dizziness, tremors, seizures, syncope, facial asymmetry, speech difficulty, weakness, light-headedness, numbness and headaches.  Hematological: Negative.   Psychiatric/Behavioral: Positive for agitation, decreased concentration, dysphoric mood and sleep disturbance. Negative for confusion, hallucinations, self-injury and suicidal ideas. The patient is nervous/anxious. The patient is not hyperactive.   All other systems reviewed and are negative.       Objective:  BP 118/73   Pulse 87   Temp (!) 97.5 F (36.4 C)   Ht '5\' 1"'  (1.549 m)   Wt 218 lb (98.9 kg)   LMP 09/07/2017   BMI 41.19 kg/m    Wt Readings from Last 3 Encounters:  04/25/19 218 lb (98.9 kg)  04/03/19 218 lb 6.4 oz (99.1 kg)  11/13/18 219 lb (99.3  kg)    Physical Exam Vitals signs and nursing note reviewed.  Constitutional:      General: She is not in acute distress.    Appearance: Normal appearance. She is well-developed and well-groomed. She is obese. She is not ill-appearing, toxic-appearing or diaphoretic.  HENT:     Head: Normocephalic and atraumatic.     Jaw: There is normal jaw occlusion.  Right Ear: Hearing, tympanic membrane, ear canal and external ear normal.     Left Ear: Hearing, tympanic membrane, ear canal and external ear normal.     Nose: Nose normal.     Mouth/Throat:     Lips: Pink.     Mouth: Mucous membranes are moist.     Pharynx: Oropharynx is clear. Uvula midline.  Eyes:     General: Lids are normal.     Extraocular Movements: Extraocular movements intact.     Conjunctiva/sclera: Conjunctivae normal.     Pupils: Pupils are equal, round, and reactive to light.  Neck:     Musculoskeletal: Normal range of motion and neck supple.     Thyroid: No thyroid mass, thyromegaly or thyroid tenderness.     Vascular: No carotid bruit or JVD.     Trachea: Trachea and phonation normal.  Cardiovascular:     Rate and Rhythm: Normal rate and regular rhythm.     Chest Wall: PMI is not displaced.     Pulses: Normal pulses.     Heart sounds: Normal heart sounds. No murmur. No friction rub. No gallop.   Pulmonary:     Effort: Pulmonary effort is normal. No respiratory distress.     Breath sounds: Normal breath sounds. No wheezing.  Chest:     Chest wall: No mass, lacerations, deformity, swelling, tenderness, crepitus or edema. There is no dullness to percussion.     Breasts: Breasts are symmetrical.        Right: Normal. No swelling, bleeding, inverted nipple, mass, nipple discharge, skin change or tenderness.        Left: Normal. No swelling, bleeding, inverted nipple, mass, nipple discharge, skin change or tenderness.  Abdominal:     General: Bowel sounds are normal. There is no distension or abdominal bruit.      Palpations: Abdomen is soft. There is no hepatomegaly, splenomegaly or mass.     Tenderness: There is no abdominal tenderness. There is no right CVA tenderness, left CVA tenderness, guarding or rebound.     Hernia: No hernia is present. There is no hernia in the left inguinal area or right inguinal area.  Genitourinary:    General: Normal vulva.     Exam position: Lithotomy position.     Pubic Area: No rash or pubic lice.      Labia:        Right: No tenderness, lesion or injury.        Left: No rash, tenderness, lesion or injury.      Urethra: No prolapse, urethral pain, urethral swelling or urethral lesion.     Vagina: No signs of injury and foreign body. Vaginal discharge (white homogenous discharge that adheres to vacinal wall. not odorous ) present. No erythema, tenderness, bleeding, lesions or prolapsed vaginal walls.     Uterus: Absent.      Rectum: Tenderness and anal fissure (small rectocele / anal fissure at 5 o'clock, no bleeding, swelling, or drainage) present. No mass, external hemorrhoid or internal hemorrhoid. Normal anal tone.     Comments: Cervix absent, s/p hysterectomy.  Musculoskeletal: Normal range of motion.     Right lower leg: No edema.     Left lower leg: No edema.  Lymphadenopathy:     Cervical: No cervical adenopathy.     Upper Body:     Right upper body: No supraclavicular, axillary or pectoral adenopathy.     Left upper body: No supraclavicular, axillary or pectoral adenopathy.  Lower Body: No right inguinal adenopathy. No left inguinal adenopathy.  Skin:    General: Skin is warm and dry.     Capillary Refill: Capillary refill takes less than 2 seconds.     Coloration: Skin is not cyanotic, jaundiced or pale.     Findings: No rash.  Neurological:     General: No focal deficit present.     Mental Status: She is alert and oriented to person, place, and time.     Cranial Nerves: Cranial nerves are intact. No cranial nerve deficit.     Sensory: Sensation  is intact. No sensory deficit.     Motor: Motor function is intact. No weakness.     Coordination: Coordination is intact. Coordination normal.     Gait: Gait is intact. Gait normal.     Deep Tendon Reflexes: Reflexes are normal and symmetric. Reflexes normal.  Psychiatric:        Attention and Perception: Attention and perception normal.        Mood and Affect: Mood and affect normal.        Speech: Speech normal.        Behavior: Behavior normal. Behavior is cooperative.        Thought Content: Thought content normal.        Cognition and Memory: Cognition and memory normal.        Judgment: Judgment normal.     Results for orders placed or performed in visit on 04/25/19  WET PREP FOR [place], YEAST, CLUE   Specimen: Vaginal Fluid   VAGINAL FLUI  Result Value Ref Range   Trichomonas Exam Negative Negative   Yeast Exam Negative Negative   Clue Cell Exam Negative Negative  Bayer DCA Hb A1c Waived  Result Value Ref Range   HB A1C (BAYER DCA - WAIVED) 8.9 (H) <7.0 %     wet prep negative in office  Pertinent labs & imaging results that were available during my care of the patient were reviewed by me and considered in my medical decision making.  Assessment & Plan:  Chaise was seen today for annual exam and gynecologic exam.  Diagnoses and all orders for this visit:  Encounter for general adult medical examination with abnormal findings Pt has had a total hysterectomy. If this PAP is unremarkable, will discuss discontinuation. Health maintenance discussed. Diet and exercise encouraged.  -     CMP14+EGFR -     CBC with Differential/Platelet -     Microalbumin / creatinine urine ratio -     Lipid panel -     Thyroid Panel With TSH -     WET PREP FOR TRICH, YEAST, CLUE -     Bayer DCA Hb A1c Waived -     MM 3D SCREEN BREAST BILATERAL; Future -     IGP, Aptima HPV, rfx 16/18,45  Hyperlipidemia associated with type 2 diabetes mellitus ([place]) Diet encouraged - increase intake of  fresh fruits and vegetables, increase intake of lean proteins. Bake, broil, or grill foods. Avoid fried, greasy, and fatty foods. Avoid fast foods. Increase intake of fiber-rich whole grains. Exercise encouraged - at least 150 minutes per week and advance as tolerated.  Goal BMI < 25. Continue medications as prescribed. Follow up in 3-6 months as discussed.  -     Lipid panel  Hypertension associated with type 2 diabetes mellitus (HCC) BP well controlled. Changes were not made in regimen today. Daily blood pressure log given with instructions on how to  fill out and told to bring to next visit. Gaol BP 130/80. Pt aware to report any persistent high or low readings. DASH diet and exercise encouraged. Exercise at least 150 minutes per week and increase as tolerated. Goal BMI > 25. Stress management encouraged. Smoking cessation discussed. Avoid excessive alcohol. Avoid NSAID's. Avoid more than 2000 mg of sodium daily. Medications as prescribed. Follow up as scheduled.  -     Microalbumin / creatinine urine ratio -     Thyroid Panel With TSH  Type 2 diabetes mellitus with hyperglycemia, with long-term current use of insulin (HCC) Lab Results  Component Value Date   HGBA1C 8.9 (H) 04/25/2019   HGBA1C 7.3 (H) 01/19/2019   HGBA1C 8.1 (H) 09/06/2018    Diabetes Control: poorly Instruction/counseling given: reminded to get eye exam, reminded to bring blood glucose meter & log to each visit, reminded to bring medications to each visit, discussed foot care, discussed the need for weight loss, discussed diet and provided printed educational material   1.  Rx changes: increased Victoza to 1.8 mg nightly 2.  Education: Reviewed 'ABCs' of diabetes management (respective goals in parentheses):  A1C (<7), blood pressure (<130/80), BMI (<25), and cholesterol (LDL <100). 3.  Discussed pathophysiology of DM; difference between type 1 and type 2 DM. 4.  CHO counting diet discussed.  Reviewed CHO amount in various  foods and how to read nutrition labels.  Discussed recommended serving sizes.  5.  Recommend check BG several times a week and record readings to bring to next appointment. Report persistent high or low readings. 6.  Recommended increase physical activity - goal is 150 minutes per week and advance as tolerated. 7.  Adequate sleep, at least 6-8 hours per night.  8.  Smoking cessation.  9.  Follow up: 3 months  -     Microalbumin / creatinine urine ratio -     Thyroid Panel With TSH -     Bayer DCA Hb A1c Waived -     liraglutide (VICTOZA) 18 MG/3ML SOPN; Inject 0.3 mLs (1.8 mg total) into the skin at bedtime.  Morbid obesity ([place]) Diet and exercise encouraged. Will recheck BMI in 3 months.  -     CMP14+EGFR -     Lipid panel -     Thyroid Panel With TSH  Screening for deficiency anemia -     CBC with Differential/Platelet  Cervical cancer screening If unremarkable, can discontinue due to total hysterectomy.  -     IGP, Aptima HPV, rfx 16/18,45  Rectocele Followed by GI. Symptomatic care discussed. Report any new or worsening symptoms.   Screening for breast cancer Schedule mammogram on [facility] bus.  -     MM 3D SCREEN BREAST BILATERAL; Future  Vaginal discharge Wet prep negative. Denies possibility of ST as not currently sexually active. Vaginal hygiene discussed. Report continued symptoms.  -     WET PREP FOR TRICH, YEAST, CLUE   Continue all other maintenance medications.  Follow up plan: Return in about 3 months (around 07/26/2019), or if symptoms worsen or fail to improve, for DM.  Continue healthy lifestyle choices, including diet (rich in fruits, vegetables, and lean proteins, and low in salt and simple carbohydrates) and exercise (at least 30 minutes of moderate physical activity daily).  Educational handout given for health maintenance  The above assessment and management plan was discussed with the patient. The patient verbalized understanding of and has agreed to  the management plan. Patient is aware  to call the clinic if symptoms persist or worsen. Patient is aware when to return to the clinic for a follow-up visit. Patient educated on when it is appropriate to go to the emergency department.   Monia Pouch, FNP-C [facility] [phone] 04/26/19

## 2019-04-25 NOTE — Telephone Encounter (Signed)
Rx Meloxicam 15MG  30Tab sent to pharmacy [facility], [place]

## 2019-04-26 ENCOUNTER — Ambulatory Visit (INDEPENDENT_AMBULATORY_CARE_PROVIDER_SITE_OTHER): Payer: Medicaid Other | Admitting: Psychiatry

## 2019-04-26 ENCOUNTER — Encounter: Payer: Self-pay | Admitting: Family Medicine

## 2019-04-26 DIAGNOSIS — F331 Major depressive disorder, recurrent, moderate: Secondary | ICD-10-CM

## 2019-04-26 DIAGNOSIS — F4310 Post-traumatic stress disorder, unspecified: Secondary | ICD-10-CM

## 2019-04-26 DIAGNOSIS — R748 Abnormal levels of other serum enzymes: Secondary | ICD-10-CM | POA: Insufficient documentation

## 2019-04-26 LAB — THYROID PANEL WITH TSH
Free Thyroxine Index: 1.3 (ref 1.2–4.9)
T3 Uptake Ratio: 21 % — ABNORMAL LOW (ref 24–39)
T4, Total: 6 ug/dL (ref 4.5–12.0)
TSH: 3.17 u[IU]/mL (ref 0.450–4.500)

## 2019-04-26 LAB — CBC WITH DIFFERENTIAL/PLATELET
Basophils Absolute: 0.1 10*3/uL (ref 0.0–0.2)
Basos: 1 %
EOS (ABSOLUTE): 0.2 10*3/uL (ref 0.0–0.4)
Eos: 2 %
Hematocrit: 49.1 % — ABNORMAL HIGH (ref 34.0–46.6)
Hemoglobin: 16.5 g/dL — ABNORMAL HIGH (ref 11.1–15.9)
Immature Grans (Abs): 0 10*3/uL (ref 0.0–0.1)
Immature Granulocytes: 0 %
Lymphocytes Absolute: 2.6 10*3/uL (ref 0.7–3.1)
Lymphs: 34 %
MCH: 31.7 pg (ref 26.6–33.0)
MCHC: 33.6 g/dL (ref 31.5–35.7)
MCV: 94 fL (ref 79–97)
Monocytes Absolute: 0.5 10*3/uL (ref 0.1–0.9)
Monocytes: 7 %
Neutrophils Absolute: 4.3 10*3/uL (ref 1.4–7.0)
Neutrophils: 56 %
Platelets: 180 10*3/uL (ref 150–450)
RBC: 5.2 x10E6/uL (ref 3.77–5.28)
RDW: 12.2 % (ref 11.7–15.4)
WBC: 7.7 10*3/uL (ref 3.4–10.8)

## 2019-04-26 LAB — LIPID PANEL
Chol/HDL Ratio: 10.6 ratio — ABNORMAL HIGH (ref 0.0–4.4)
Cholesterol, Total: 181 mg/dL (ref 100–199)
HDL: 17 mg/dL — ABNORMAL LOW (ref >39)
Triglycerides: 639 mg/dL (ref 0–149)

## 2019-04-26 LAB — CMP14+EGFR
ALT: 12 IU/L (ref 0–32)
AST: 14 IU/L (ref 0–40)
Albumin/Globulin Ratio: 1.4 (ref 1.2–2.2)
Albumin: 3.9 g/dL (ref 3.8–4.8)
Alkaline Phosphatase: 130 IU/L — ABNORMAL HIGH (ref 39–117)
BUN/Creatinine Ratio: 20 (ref 9–23)
BUN: 15 mg/dL (ref 6–24)
Bilirubin Total: 0.2 mg/dL (ref 0.0–1.2)
CO2: 19 mmol/L — ABNORMAL LOW (ref 20–29)
Calcium: 8.9 mg/dL (ref 8.7–10.2)
Chloride: 99 mmol/L (ref 96–106)
Creatinine, Ser: 0.75 mg/dL (ref 0.57–1.00)
GFR calc Af Amer: 111 mL/min/{1.73_m2} (ref >59)
GFR calc non Af Amer: 96 mL/min/{1.73_m2} (ref >59)
Globulin, Total: 2.8 g/dL (ref 1.5–4.5)
Glucose: 245 mg/dL — ABNORMAL HIGH (ref 65–99)
Potassium: 3.8 mmol/L (ref 3.5–5.2)
Sodium: 137 mmol/L (ref 134–144)
Total Protein: 6.7 g/dL (ref 6.0–8.5)

## 2019-04-26 LAB — MICROALBUMIN / CREATININE URINE RATIO
Creatinine, Urine: 37.1 mg/dL
Microalb/Creat Ratio: <8 mg/g creat (ref 0–29)
Microalbumin, Urine: <3 ug/mL

## 2019-04-26 NOTE — Addendum Note (Signed)
Addended by: Baruch Gouty on: 04/26/2019 12:16 PM   Modules accepted: Orders
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
## 2019-04-28 LAB — IGP, APTIMA HPV, RFX 16/18,45: HPV Aptima: NEGATIVE

## 2019-04-30 ENCOUNTER — Encounter: Payer: Self-pay | Admitting: Family Medicine

## 2019-04-30 ENCOUNTER — Telehealth: Payer: Self-pay | Admitting: Family Medicine

## 2019-05-09 ENCOUNTER — Encounter: Payer: Self-pay | Admitting: Family Medicine

## 2019-05-09 ENCOUNTER — Ambulatory Visit: Payer: Medicaid Other | Admitting: Family Medicine

## 2019-05-09 ENCOUNTER — Other Ambulatory Visit: Payer: Self-pay | Admitting: *Deleted

## 2019-05-09 ENCOUNTER — Other Ambulatory Visit: Payer: Self-pay

## 2019-05-09 VITALS — BP 152/98 | HR 100 | Temp 98.6°F | Ht 61.0 in | Wt 215.0 lb

## 2019-05-09 DIAGNOSIS — N904 Leukoplakia of vulva: Secondary | ICD-10-CM

## 2019-05-09 DIAGNOSIS — K130 Diseases of lips: Secondary | ICD-10-CM

## 2019-05-09 DIAGNOSIS — K146 Glossodynia: Secondary | ICD-10-CM | POA: Diagnosis not present

## 2019-05-09 MED ORDER — CLOBETASOL PROPIONATE 0.05 % EX CREA: 1.0000 "application " | TOPICAL_CREAM | Freq: Every day | CUTANEOUS | 1 refills | Status: DC

## 2019-05-09 NOTE — Patient Instructions (Signed)
  Lichen Sclerosus Lichen sclerosus is a skin problem. It can happen on any part of the body. It happens most often in the anal or genital areas. It can cause itching and discomfort. Treatment can help to control symptoms. It can also help prevent scarring that may lead to other problems. What are the causes? The cause of this condition is not known. It is not passed from one person to another (not contagious). What increases the risk? This condition is more likely to develop in women. It most often occurs after menopause. What are the signs or symptoms? Symptoms of this condition include:  Thin, wrinkled, white areas on the skin.  Thickened white areas on the skin.  Red and swollen patches (lesions) on the skin.  Tears or cracks in the skin.  Bruising.  Blood blisters.  Very bad itching.  Pain, itching, or burning when peeing (urinating).  Trouble pooping (constipation). How is this diagnosed? This condition may be diagnosed with a physical exam. A sample of your skin may also be removed to be looked at under a microscope (biopsy). How is this treated? This condition may be treated with:  Creams or ointments (topical steroids) that are put on the skin in the affected areas. This is the most common treatment.  Medicines that are taken by mouth.  Surgery. This is only needed if the condition is very bad and is causing problems such as scarring. Follow these instructions at home:  Take or use over-the-counter and prescription medicines only as told by your doctor.  Use creams or ointments as told by your doctor.  Do not scratch the affected areas of skin.  If you are a woman, keep the vagina as clean and dry as you can.  Clean the affected area of skin gently with water. Avoid using rough towels or toilet paper.  Keep all follow-up visits as told by your doctor. This is important. Contact a doctor if:  Your redness, swelling, or pain gets worse.  You have fluid,  blood, or pus coming from the area.  You have new red and swollen patches on your skin.  You have a fever.  You have pain during sex. Summary  Lichen sclerosus is a skin problem. It can cause itching and discomfort.  This condition is usually treated with creams or ointments that are put on the skin in the affected areas.  Use medicines only as told by your doctor.  Do not scratch the affected areas of skin.  Keep all follow-up visits as told by your doctor. This is important. This information is not intended to replace advice given to you by your health care provider. Make sure you discuss any questions you have with your health care provider. Document Released: 07/29/2008 Document Revised: 12/29/2017 Document Reviewed: 12/29/2017 Elsevier Patient Education  2020 [employer].

## 2019-05-09 NOTE — Progress Notes (Signed)
Assessment & Plan:  1. Lichen sclerosus of female genitalia - Education provided. Encouraged to use clobetasol cream QHS for 6-12 weeks and then wean down by using every other night for a few weeks, and then only twice weekly for a few more weeks. Advised she may need to use it for flares.  - clobetasol cream (TEMOVATE) 0.05 %; Apply 1 application topically at bedtime.  Dispense: 60 g; Refill: 1  2. Burning tongue - Vitamin B12  3. Cheilitis - Vitamin B12   Follow up plan: Return if symptoms worsen or fail to improve.  Hendricks Limes, MSN, APRN, FNP-C [facility]  Subjective:   Patient ID: Lisa Norton, female    DOB: January 30, 1971, 48 y.o.   MRN: [id number]  HPI: Lisa Norton is a 48 y.o. female presenting on 05/09/2019 for itching on outer vagina  Patient reports intense itching above her clitoris that has been going on for several weeks at least. If she accidentally hits the area when wiping or anything it causes significant pain. Intercourse is also painful. She has been applying OTC moisturizers and monistat to see if she can clear it up but she has not been able.   Patient also reports her tongue is always dry and hurts. She at times develops cracks at the sides of her mouth. She has been using Biotene mouthwash and spray but states it only helps for a few minutes. Feels it doesn't matter how much water she drinks, it doesn't get better.    ROS: Negative unless specifically indicated above in HPI.   Relevant past medical history reviewed and updated as indicated.   Allergies and medications reviewed and updated.   Current Outpatient Medications:  .  Accu-Chek FastClix Lancets MISC, 4 TIMES DAILY, Disp: 102 each, Rfl: 5 .  ACCU-CHEK GUIDE test strip, 4 TIMES DAILY, Disp: 100 each, Rfl: 5 .  cetirizine (ZYRTEC) 10 MG tablet, Take 1 tablet (10 mg total) by mouth daily., Disp: 30 tablet, Rfl: 11 .  clotrimazole (CLOTRIMAZOLE ATHLETES FOOT) 1 % cream,  Apply 1 application topically 2 (two) times daily., Disp: 30 g, Rfl: 0 .  cyclobenzaprine (FLEXERIL) 10 MG tablet, Take 1 tablet (10 mg total) by mouth 3 (three) times daily as needed for muscle spasms., Disp: 60 tablet, Rfl: 3 .  dapagliflozin propanediol (FARXIGA) 10 MG TABS tablet, Take 10 mg by mouth daily., Disp: 30 tablet, Rfl: 4 .  EPINEPHrine (EPIPEN 2-PAK) 0.3 mg/0.3 mL IJ SOAJ injection, Inject 0.3 mLs (0.3 mg total) into the muscle as needed for anaphylaxis., Disp: 2 Device, Rfl: 2 .  famotidine (PEPCID) 20 MG tablet, Take 1 tablet (20 mg total) by mouth 2 (two) times daily., Disp: 60 tablet, Rfl: 5 .  FLUoxetine (PROZAC) 10 MG capsule, 10 mg daily for one week, then 20 mg daily, Disp: 60 capsule, Rfl: 0 .  fluticasone (FLONASE) 50 MCG/ACT nasal spray, Place 2 sprays into both nostrils daily., Disp: 16 g, Rfl: 5 .  gabapentin (NEURONTIN) 300 MG capsule, Take 1 capsule (300 mg total) by mouth 2 (two) times daily. Increase to 4x/day as needed, Disp: 60 capsule, Rfl: 1 .  hydrOXYzine (VISTARIL) 50 MG capsule, TAKE 2 CAPSULES 3 TIMES DAILY AS NEEDED, Disp: 60 capsule, Rfl: 1 .  insulin detemir (LEVEMIR) 100 UNIT/ML injection, INJECT 0.2ML (20 UNITS TOTAL) SQ AT BEDTIME, Disp: 10 mL, Rfl: 4 .  levocetirizine (XYZAL) 5 MG tablet, Take 1 tablet (5 mg total) by mouth every evening., Disp:  60 tablet, Rfl: 5 .  linaclotide (LINZESS) 72 MCG capsule, Take 72 mcg by mouth daily before breakfast., Disp: , Rfl:  .  liraglutide (VICTOZA) 18 MG/3ML SOPN, Inject 0.3 mLs (1.8 mg total) into the skin daily., Disp: 9 mL, Rfl: 6 .  magnesium hydroxide (MILK OF MAGNESIA) 400 MG/5ML suspension, Take 5-10 mLs by mouth daily as needed for mild constipation., Disp: , Rfl:  .  meloxicam (MOBIC) 15 MG tablet, TAKE ONE (1) TABLET EACH DAY, Disp: 60 tablet, Rfl: 1 .  mupirocin ointment (BACTROBAN) 2 %, Apply 1 application topically 2 (two) times daily., Disp: 22 g, Rfl: 0 .  polyethylene glycol powder (GLYCOLAX/MIRALAX)  powder, Take 17 g by mouth 2 (two) times daily as needed. (Patient taking differently: Take 17 g by mouth 2 (two) times daily as needed for moderate constipation. ), Disp: 3350 g, Rfl: 1 .  pravastatin (PRAVACHOL) 80 MG tablet, TAKE ONE (1) TABLET EACH DAY, Disp: 30 tablet, Rfl: 5 .  sertraline (ZOLOFT) 100 MG tablet, Take 1 tablet (100 mg total) by mouth at bedtime., Disp: 30 tablet, Rfl: 5 .  ULTICARE MICRO PEN NEEDLES 32G X 4 MM MISC, 3 TIMES DAILY, Disp: 100 each, Rfl: 11 .  clobetasol cream (TEMOVATE) [id number] %, Apply 1 application topically at bedtime., Disp: 60 g, Rfl: 1  Allergies  Allergen Reactions  . Metformin And Related Diarrhea    Objective:   BP (!) 152/98   Pulse 100   Temp 98.6 F (37 C) (Temporal)   Ht 5\' 1"  (1.549 m)   Wt 215 lb (97.5 kg)   LMP 09/07/2017   SpO2 97%   BMI 40.62 kg/m    Physical Exam Vitals signs reviewed. Exam conducted with a chaperone present.  Constitutional:      General: She is not in acute distress.    Appearance: Normal appearance. She is obese. She is not ill-appearing, toxic-appearing or diaphoretic.  HENT:     Head: Normocephalic and atraumatic.     Mouth/Throat:     Mouth: Mucous membranes are dry.     Comments: Red tongue.  Eyes:     General: No scleral icterus.       Right eye: No discharge.        Left eye: No discharge.     Conjunctiva/sclera: Conjunctivae normal.  Neck:     Musculoskeletal: Normal range of motion and neck supple.  Cardiovascular:     Rate and Rhythm: Tachycardia present.  Pulmonary:     Effort: Pulmonary effort is normal. No respiratory distress.  Genitourinary:    Comments: White skin with a fissure of the clitoral hood. Excoriation present to bilateral labium minora.  Musculoskeletal: Normal range of motion.  Skin:    General: Skin is warm and dry.     Capillary Refill: Capillary refill takes less than 2 seconds.  Neurological:     General: No focal deficit present.     Mental Status: She is alert  and oriented to person, place, and time. Mental status is at baseline.  Psychiatric:        Mood and Affect: Mood normal.        Behavior: Behavior normal.        Thought Content: Thought content normal.        Judgment: Judgment normal.

## 2019-05-10 ENCOUNTER — Telehealth (HOSPITAL_COMMUNITY): Payer: Self-pay | Admitting: Psychiatry

## 2019-05-10 ENCOUNTER — Ambulatory Visit (HOSPITAL_COMMUNITY): Payer: Medicaid Other | Admitting: Psychiatry

## 2019-05-10 ENCOUNTER — Other Ambulatory Visit: Payer: Self-pay

## 2019-05-10 LAB — VITAMIN B12: Vitamin B-12: 624 pg/mL (ref 232–1245)

## 2019-05-10 NOTE — Telephone Encounter (Signed)
Therapist attempted to contact patient twice via text for scheduled appointment, no response. Therapist called patient and left message indicating attempt and requesting patient call office.

## 2019-05-17 ENCOUNTER — Ambulatory Visit (INDEPENDENT_AMBULATORY_CARE_PROVIDER_SITE_OTHER): Payer: Medicaid Other

## 2019-05-17 ENCOUNTER — Encounter: Payer: Self-pay | Admitting: Family Medicine

## 2019-05-17 ENCOUNTER — Other Ambulatory Visit: Payer: Self-pay | Admitting: *Deleted

## 2019-05-17 ENCOUNTER — Ambulatory Visit (INDEPENDENT_AMBULATORY_CARE_PROVIDER_SITE_OTHER): Payer: Medicaid Other | Admitting: Orthopaedic Surgery

## 2019-05-17 ENCOUNTER — Encounter: Payer: Self-pay | Admitting: Orthopaedic Surgery

## 2019-05-17 VITALS — BP 142/93 | HR 93 | Ht 61.0 in | Wt 215.0 lb

## 2019-05-17 DIAGNOSIS — M545 Low back pain, unspecified: Secondary | ICD-10-CM | POA: Insufficient documentation

## 2019-05-17 DIAGNOSIS — M7062 Trochanteric bursitis, left hip: Secondary | ICD-10-CM | POA: Diagnosis not present

## 2019-05-17 DIAGNOSIS — M25552 Pain in left hip: Secondary | ICD-10-CM

## 2019-05-17 DIAGNOSIS — G8929 Other chronic pain: Secondary | ICD-10-CM | POA: Diagnosis not present

## 2019-05-17 DIAGNOSIS — N904 Leukoplakia of vulva: Secondary | ICD-10-CM

## 2019-05-17 DIAGNOSIS — M79604 Pain in right leg: Secondary | ICD-10-CM

## 2019-05-17 NOTE — Progress Notes (Signed)
Office Visit Note   Patient: Lisa Norton           Date of Birth: December 11, 1970           MRN: [id number] Visit Date: 05/17/2019              Requested by: Baruch Gouty, [place],  [place] [address] PCP: Baruch Gouty, FNP   Assessment & Plan: Visit Diagnoses:  1. Trochanteric bursitis, left hip   2. Chronic left hip pain   3. Low back pain without sciatica, unspecified back pain laterality, unspecified chronicity     Plan: X-rays were reviewed.  We discussed recommendations for weight loss to help unload both her hip as well as her back.  We discussed diet exercise use of pool therapy once COVID restrictions are improved.  Follow-up here as needed peer  Follow-Up Instructions: No follow-ups on file.   Orders:  Orders Placed This Encounter  Procedures  . XR HIP UNILAT W OR W/O PELVIS 2-3 VIEWS LEFT   No orders of the defined types were placed in this encounter.     Procedures: No procedures performed   Clinical Data: No additional findings.   Subjective: Chief Complaint  Patient presents with  . Lower Back - Pain    Previously ordered MRI was not approved by Medicaid due to lack of conservative     HPI 48 year old female returns.  She had pain in her hip that did not respond to trochanteric injection on the left had tenderness on the gluteus medius and MRI was denied by her insurance.  She now states she is having upper lumbar pain she had recent surgery on her heel by podiatrist still having some limping from this.  She has not worked since 2017 applying for disability has been denied.  She has an attorney who is assisting her.  She states she has back pain occasionally feels like it locks up on her she denies associated bowel or bladder symptoms.  Review of Systems updated unchanged.  Of note is hypertension, diabetes, morbid obesity.   Objective: Vital Signs: BP (!) 142/93   Pulse 93   Ht 5\' 1"  (1.549 m)   Wt 215 lb (97.5 kg)   LMP  09/07/2017   BMI 40.62 kg/m   Physical Exam Constitutional:      Appearance: She is well-developed.  HENT:     Head: Normocephalic.     Right Ear: External ear normal.     Left Ear: External ear normal.  Eyes:     Pupils: Pupils are equal, round, and reactive to light.  Neck:     Thyroid: No thyromegaly.     Trachea: No tracheal deviation.  Cardiovascular:     Rate and Rhythm: Normal rate.  Pulmonary:     Effort: Pulmonary effort is normal.  Abdominal:     Palpations: Abdomen is soft.  Skin:    General: Skin is warm and dry.  Neurological:     Mental Status: She is alert and oriented to person, place, and time.  Psychiatric:        Behavior: Behavior normal.     Ortho Exam patient has tenderness palpation over the upper lumbar region.  Knee and ankle jerk are intact negative logroll of the hip she has some tenderness over the greater trochanter also gluteus medius on the left.  Negative Trendelenburg gait.  She is ambulatory with a slight limp due to recent heel surgery.  Anterior tib gastrocsoleus is intact.  No quad weakness.  Specialty Comments:  No specialty comments available.  Imaging: Xr Hip Unilat W Or W/o Pelvis 2-3 Views Left  Result Date: 05/17/2019 AP pelvis show hips and frog-leg lateral left hip are obtained and reviewed.  This shows normal anatomy no acute changes normal joint space no marginal osteophytes. Impression: Normal hip x-rays.    PMFS History: Patient Active Problem List   Diagnosis Date Noted  . Low back pain 05/17/2019  . Elevated alkaline phosphatase level 04/26/2019  . Xerostomia 03/20/2019  . PTSD (post-traumatic stress disorder) 02/07/2019  . MDD (major depressive disorder), recurrent episode, moderate ([place]) 02/07/2019  . Allergy to alpha-gal 01/23/2019  . Seasonal and perennial allergic rhinitis 01/02/2019  . Recurrent urticaria 12/04/2018  . 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 Prolapsed internal hemorrhoids, grade 2-4 s/p hemorrhoidal ligation/pexy/resection 10/12/2018 10/12/2018  . Trochanteric bursitis, left hip 09/14/2018  . Rectocele 01/05/2018  . Uterovaginal prolapse 01/05/2018  . Urinary incontinence, mixed 12/16/2017  . S/P cesarean section 02/12/2015  . Fetal demise, greater than 22 weeks, antepartum   . 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 Morbid obesity ([place]) 08/13/2014  . History of hypertension 08/12/2014  . C7 radiculopathy 02/11/2014  . Hyperlipidemia associated with type 2 diabetes mellitus ([place]) 01/04/2011  . Type 2 diabetes mellitus ([place]) 01/04/2011  . TOBACCO ABUSE 12/12/2009  . Hypertension associated with type 2 diabetes mellitus ([place]) 12/12/2009   Past Medical History:  Diagnosis Date  . Anxiety   . COLD SORE 05/15/2010  . DEPRESSION 12/12/2009  . Diabetes mellitus without complication ([place])   . High cholesterol   . 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 HYPERTENSION 12/12/2009  . TOBACCO ABUSE 12/12/2009    Family History  Problem Relation Age of Onset  . Heart disease Mother   . Narcolepsy Mother   . Bipolar disorder Sister   . Allergic rhinitis Neg Hx   . Asthma Neg Hx   . Eczema Neg Hx   . Urticaria Neg Hx     Past Surgical History:  Procedure Laterality Date  . BLADDER SUSPENSION    . CERVICAL FUSION    . CESAREAN SECTION WITH BILATERAL TUBAL LIGATION Bilateral 02/12/2015   Procedure: CESAREAN SECTION;  Surgeon: Florian Buff, MD;  Location: [facility];  Service: Obstetrics;  Laterality: Bilateral;  Fetal Demise  . CHOLECYSTECTOMY  2006  . EVALUATION UNDER ANESTHESIA WITH HEMORRHOIDECTOMY N/A 10/12/2018   Procedure: LATERAL INTERNAL HEMORRHOID LIGATION/PEXY ANORECTAL EXAM UNDER ANESTHESIA WITH HEMORRHOIDECTOMY X2;  Surgeon: Michael Boston, MD;  Location: [facility];  Service: General;  Laterality: N/A;  . FOOT SURGERY Right    Plantar, bone spurs  . FOOT SURGERY  2019 and 2020  . LAPAROSCOPIC VAGINAL HYSTERECTOMY WITH  SALPINGECTOMY  02/16/2018   [facility]  . TONSILLECTOMY  1988   Social History   Occupational History  . Not on file  Tobacco Use  . Smoking status: Current Every Day Smoker    Packs/day: 1.50    Years: 20.00    Pack years: 30.00    Types: Cigarettes  . Smokeless tobacco: Former Systems developer    Types: Snuff    Quit date: 05/05/1986  Substance and Sexual Activity  . Alcohol use: Yes    Comment: had a bout of heavy drinking 2015, drinks occasionally now   . Drug use: Not Currently    Comment: past use of marijuana about every other day for  two years, last used 25 years ago.   Marland Kitchen Sexual activity: Yes    Birth control/protection: None, Surgical    Comment: pt states she did not have a tubal
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
## 2019-05-22 ENCOUNTER — Other Ambulatory Visit: Payer: Self-pay

## 2019-05-22 ENCOUNTER — Encounter (HOSPITAL_COMMUNITY): Payer: Self-pay | Admitting: Psychiatry

## 2019-05-22 ENCOUNTER — Ambulatory Visit (INDEPENDENT_AMBULATORY_CARE_PROVIDER_SITE_OTHER): Payer: Medicaid Other | Admitting: Psychiatry

## 2019-05-22 DIAGNOSIS — F4310 Post-traumatic stress disorder, unspecified: Secondary | ICD-10-CM

## 2019-05-22 DIAGNOSIS — F331 Major depressive disorder, recurrent, moderate: Secondary | ICD-10-CM | POA: Diagnosis not present

## 2019-05-22 MED ORDER — FLUOXETINE HCL 20 MG PO CAPS: 20.0000 mg | ORAL_CAPSULE | Freq: Every day | ORAL | 0 refills | Status: DC

## 2019-05-22 NOTE — Patient Instructions (Signed)
1. Continue fluoxetine 20 mg daily  2. Next appointment: 10/21 at 10:30

## 2019-05-24 ENCOUNTER — Other Ambulatory Visit: Payer: Self-pay

## 2019-05-24 ENCOUNTER — Ambulatory Visit (INDEPENDENT_AMBULATORY_CARE_PROVIDER_SITE_OTHER): Payer: Medicaid Other | Admitting: Psychiatry

## 2019-05-24 DIAGNOSIS — F331 Major depressive disorder, recurrent, moderate: Secondary | ICD-10-CM

## 2019-05-24 DIAGNOSIS — F4310 Post-traumatic stress disorder, unspecified: Secondary | ICD-10-CM | POA: Diagnosis not present
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
## 2019-05-31 ENCOUNTER — Encounter: Payer: Self-pay | Admitting: Podiatry

## 2019-05-31 DIAGNOSIS — M216X2 Other acquired deformities of left foot: Secondary | ICD-10-CM

## 2019-05-31 DIAGNOSIS — M7662 Achilles tendinitis, left leg: Secondary | ICD-10-CM

## 2019-06-01 ENCOUNTER — Other Ambulatory Visit: Payer: Self-pay | Admitting: Family Medicine

## 2019-06-01 ENCOUNTER — Telehealth: Payer: Self-pay | Admitting: *Deleted

## 2019-06-01 ENCOUNTER — Other Ambulatory Visit: Payer: Self-pay | Admitting: Podiatry

## 2019-06-01 DIAGNOSIS — N904 Leukoplakia of vulva: Secondary | ICD-10-CM

## 2019-06-01 MED ORDER — OXYCODONE-ACETAMINOPHEN 5-325 MG PO TABS: 1.0000 | ORAL_TABLET | Freq: Four times a day (QID) | ORAL | 0 refills | Status: DC | PRN

## 2019-06-01 NOTE — Telephone Encounter (Signed)
I told pt Dr. Amalia Hailey would send in the pain medication and she should contact the pharmacy after lunch.

## 2019-06-01 NOTE — Telephone Encounter (Signed)
DOS 05/31/2019 CALCANEAL OSTECTOMY - [id number], HEEL SPUR RESECTION - [id number], REPAIR OF ACHILLES - [phone] OF THE LT FOOT  MEDICAID: E-verified until 06/30/2019

## 2019-06-01 NOTE — Progress Notes (Signed)
.  postop

## 2019-06-01 NOTE — Telephone Encounter (Signed)
Pt's boyfriend, Darci Needle states pt's medications from surgery yesterday have not been sent to the pharmacy.

## 2019-06-02 ENCOUNTER — Encounter: Payer: Self-pay | Admitting: Podiatry

## 2019-06-03 NOTE — Telephone Encounter (Signed)
Patient contacted the office last night, 06/02/2019, for complaint of a cast that was too tight.  Patient had surgery on 05/31/2019.  Patient came in this a.m. Sunday, 06/03/2019, and the cast was bivalved and pressure was released.  Reinforced with an Ace wrap and light compression.  Patient noticed immediate improvement.  She has her first follow-up postoperative visit on Wednesday, 06/06/2019.  Edrick Kins, DPM [facility]  Dr. Edrick Kins, DPM    [address], [place] [address]                Office [phone]  Fax [phone]

## 2019-06-04 ENCOUNTER — Telehealth: Payer: Self-pay | Admitting: *Deleted

## 2019-06-04 NOTE — Telephone Encounter (Addendum)
Prior Auth for Farxiga 10mg -APPROVED 06/04/2019 - 06/03/2020  PA# [id number]  Confirmation [id number]  Online [employer] Portal  Pharmacy notified

## 2019-06-06 ENCOUNTER — Ambulatory Visit (INDEPENDENT_AMBULATORY_CARE_PROVIDER_SITE_OTHER): Payer: Medicaid Other | Admitting: Podiatry

## 2019-06-06 ENCOUNTER — Encounter: Payer: Self-pay | Admitting: Podiatry

## 2019-06-06 ENCOUNTER — Ambulatory Visit (INDEPENDENT_AMBULATORY_CARE_PROVIDER_SITE_OTHER): Payer: Medicaid Other

## 2019-06-06 ENCOUNTER — Ambulatory Visit: Payer: Medicaid Other

## 2019-06-06 ENCOUNTER — Other Ambulatory Visit: Payer: Self-pay

## 2019-06-06 VITALS — BP 155/106 | HR 85 | Temp 98.2°F

## 2019-06-06 DIAGNOSIS — M7662 Achilles tendinitis, left leg: Secondary | ICD-10-CM | POA: Diagnosis not present

## 2019-06-06 DIAGNOSIS — Z09 Encounter for follow-up examination after completed treatment for conditions other than malignant neoplasm: Secondary | ICD-10-CM

## 2019-06-06 MED ORDER — OXYCODONE-ACETAMINOPHEN 10-325 MG PO TABS: 1.0000 | ORAL_TABLET | Freq: Four times a day (QID) | ORAL | 0 refills | Status: AC | PRN

## 2019-06-07 ENCOUNTER — Ambulatory Visit (INDEPENDENT_AMBULATORY_CARE_PROVIDER_SITE_OTHER): Payer: Medicaid Other | Admitting: Psychiatry

## 2019-06-07 DIAGNOSIS — F331 Major depressive disorder, recurrent, moderate: Secondary | ICD-10-CM

## 2019-06-07 DIAGNOSIS — F4310 Post-traumatic stress disorder, unspecified: Secondary | ICD-10-CM | POA: Diagnosis not present
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
## 2019-06-10 NOTE — Progress Notes (Signed)
   Subjective:  Patient presents today status post posterior and inferior heel spur resection right. DOS: 05/31/2019. She states she is doing well but reports the cast is causing pain. She has been nonweightbearing and taking the Percocet as directed. Patient is here for further evaluation and treatment.    Past Medical History:  Diagnosis Date  . Anxiety   . COLD SORE 05/15/2010  . DEPRESSION 12/12/2009  . Diabetes mellitus without complication ([place])   . High cholesterol   . 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 HYPERTENSION 12/12/2009  . TOBACCO ABUSE 12/12/2009      Objective/Physical Exam Neurovascular status intact.  Skin incisions appear to be well coapted with sutures and staples intact. No sign of infectious process noted. No dehiscence. No active bleeding noted. Moderate edema noted to the surgical extremity.  Radiographic Exam:  Orthopedic hardware and osteotomies sites appear to be stable with routine healing.  Assessment: 1. s/p posterior and inferior heel spur resection right. DOS: 05/31/2019   Plan of Care:  1. Patient was evaluated. X-rays reviewed 2. Cast removed today. Dry sterile dressing applied.  3. CAM boot dispensed. Continue nonweightbearing with knee scooter.  4. Refill prescription for Percocet 10/325 mg #30 provided to patient.  5. Return to clinic in one week.    Edrick Kins, DPM [facility]  Dr. Edrick Kins, [address]                                        [place], [place] [address]                Office [phone]  Fax [phone]
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
## 2019-06-18 ENCOUNTER — Encounter: Payer: Self-pay | Admitting: Podiatry

## 2019-06-18 ENCOUNTER — Other Ambulatory Visit: Payer: Self-pay

## 2019-06-18 ENCOUNTER — Ambulatory Visit (INDEPENDENT_AMBULATORY_CARE_PROVIDER_SITE_OTHER): Payer: Self-pay | Admitting: Podiatry

## 2019-06-18 DIAGNOSIS — Z09 Encounter for follow-up examination after completed treatment for conditions other than malignant neoplasm: Secondary | ICD-10-CM

## 2019-06-18 DIAGNOSIS — M7662 Achilles tendinitis, left leg: Secondary | ICD-10-CM

## 2019-06-18 MED ORDER — OXYCODONE-ACETAMINOPHEN 10-325 MG PO TABS: 1.0000 | ORAL_TABLET | Freq: Four times a day (QID) | ORAL | 0 refills | Status: AC | PRN

## 2019-06-20 ENCOUNTER — Ambulatory Visit (INDEPENDENT_AMBULATORY_CARE_PROVIDER_SITE_OTHER): Payer: Medicaid Other | Admitting: Psychiatry

## 2019-06-20 ENCOUNTER — Encounter (HOSPITAL_COMMUNITY): Payer: Self-pay | Admitting: Psychiatry

## 2019-06-20 ENCOUNTER — Other Ambulatory Visit: Payer: Self-pay

## 2019-06-20 DIAGNOSIS — F331 Major depressive disorder, recurrent, moderate: Secondary | ICD-10-CM

## 2019-06-20 DIAGNOSIS — F4310 Post-traumatic stress disorder, unspecified: Secondary | ICD-10-CM | POA: Diagnosis not present

## 2019-06-20 MED ORDER — FLUOXETINE HCL 40 MG PO CAPS: 40.0000 mg | ORAL_CAPSULE | Freq: Every day | ORAL | 0 refills | Status: DC

## 2019-06-20 NOTE — Patient Instructions (Signed)
1. Increase  fluoxetine 40 mg daily  2. Next appointment: 11/16 at 1 PM

## 2019-06-21 ENCOUNTER — Other Ambulatory Visit: Payer: Self-pay

## 2019-06-21 ENCOUNTER — Ambulatory Visit (INDEPENDENT_AMBULATORY_CARE_PROVIDER_SITE_OTHER): Payer: Medicaid Other | Admitting: Psychiatry

## 2019-06-21 DIAGNOSIS — F4310 Post-traumatic stress disorder, unspecified: Secondary | ICD-10-CM

## 2019-06-21 DIAGNOSIS — F331 Major depressive disorder, recurrent, moderate: Secondary | ICD-10-CM | POA: Diagnosis not present
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
## 2019-06-21 NOTE — Progress Notes (Signed)
   Subjective:  Patient presents today status post posterior and inferior heel spur resection right. DOS: 05/31/2019. She reports significant continued pain that has not improved. She has been taking Percocet for treatment and denies modifying. Patient is here for further evaluation and treatment.    Past Medical History:  Diagnosis Date  . Anxiety   . COLD SORE 05/15/2010  . DEPRESSION 12/12/2009  . Diabetes mellitus without complication ([place])   . High cholesterol   . 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 HYPERTENSION 12/12/2009  . TOBACCO ABUSE 12/12/2009      Objective/Physical Exam Neurovascular status intact.  Skin incisions appear to be well coapted with sutures and staples intact. No sign of infectious process noted. No dehiscence. No active bleeding noted. Moderate edema noted to the surgical extremity.   Assessment: 1. s/p posterior and inferior heel spur resection right. DOS: 05/31/2019   Plan of Care:  1. Patient was evaluated.  2. Dressing changed.  3. Continue nonweightbearing in CAM boot.  4. Refill prescription for Percocet 10/325 mg #30 provided to patient.  5. Return to clinic in 2 weeks for suture removal.    Edrick Kins, DPM [facility]  Dr. Edrick Kins, [address]                                        [place], [place] [address]                Office [phone]  Fax [phone]

## 2019-06-26 ENCOUNTER — Encounter: Payer: Self-pay | Admitting: Podiatry

## 2019-06-26 NOTE — Telephone Encounter (Signed)
Left message on pt's home/mobile phone I needed to inform Dr. Amalia Hailey on the current nature of the pain in her foot. Left message on pt's [phone] to call to discuss the pain in her foot.

## 2019-06-27 ENCOUNTER — Other Ambulatory Visit: Payer: Self-pay | Admitting: Podiatry

## 2019-06-27 MED ORDER — OXYCODONE-ACETAMINOPHEN 10-325 MG PO TABS: 1.0000 | ORAL_TABLET | Freq: Four times a day (QID) | ORAL | 0 refills | Status: DC | PRN

## 2019-06-27 NOTE — Progress Notes (Signed)
.  postop

## 2019-07-02 ENCOUNTER — Ambulatory Visit (INDEPENDENT_AMBULATORY_CARE_PROVIDER_SITE_OTHER): Payer: Medicaid Other

## 2019-07-02 ENCOUNTER — Other Ambulatory Visit: Payer: Self-pay

## 2019-07-02 ENCOUNTER — Ambulatory Visit: Payer: Medicaid Other | Admitting: Family Medicine

## 2019-07-02 ENCOUNTER — Ambulatory Visit (INDEPENDENT_AMBULATORY_CARE_PROVIDER_SITE_OTHER): Payer: Medicaid Other | Admitting: Podiatry

## 2019-07-02 DIAGNOSIS — Z09 Encounter for follow-up examination after completed treatment for conditions other than malignant neoplasm: Secondary | ICD-10-CM

## 2019-07-02 DIAGNOSIS — M7732 Calcaneal spur, left foot: Secondary | ICD-10-CM | POA: Diagnosis not present

## 2019-07-02 DIAGNOSIS — M7662 Achilles tendinitis, left leg: Secondary | ICD-10-CM

## 2019-07-02 DIAGNOSIS — M216X2 Other acquired deformities of left foot: Secondary | ICD-10-CM | POA: Diagnosis not present

## 2019-07-02 MED ORDER — OXYCODONE-ACETAMINOPHEN 10-325 MG PO TABS: 1.0000 | ORAL_TABLET | Freq: Four times a day (QID) | ORAL | 0 refills | Status: AC | PRN

## 2019-07-05 ENCOUNTER — Ambulatory Visit (HOSPITAL_COMMUNITY): Payer: Medicaid Other | Admitting: Psychiatry

## 2019-07-05 NOTE — Progress Notes (Signed)
   Subjective:  Patient presents today status post posterior and inferior heel spur resection right. DOS: 05/31/2019. She reports continued pain and associated swelling. Resting the foot on a surface without bearing weight makes the pain worse. She has been taking Percocet and using the CAM boot for treatment. Patient is here for further evaluation and treatment.    Past Medical History:  Diagnosis Date  . Anxiety   . COLD SORE 05/15/2010  . DEPRESSION 12/12/2009  . Diabetes mellitus without complication ([place])   . High cholesterol   . 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 HYPERTENSION 12/12/2009  . TOBACCO ABUSE 12/12/2009      Objective/Physical Exam Neurovascular status intact.  Skin incisions appear to be well coapted with sutures and staples intact. No sign of infectious process noted. No dehiscence. No active bleeding noted. Moderate edema noted to the surgical extremity.   Assessment: 1. s/p posterior and inferior heel spur resection right. DOS: 05/31/2019   Plan of Care:  1. Patient was evaluated.  2. Sutures / staples removed.  3. Unna boot applied.  4. Continue using CAM boot. Nonweightbearing with knee scooter.  5. Refill prescription for Percocet 10/325 mg #30 provided to patient. 6. Return to clinic in 4 weeks.     Edrick Kins, DPM [facility]  Dr. Edrick Kins, [address]                                        [place], [place] [address]                Office [phone]  Fax [phone]
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
## 2019-07-09 ENCOUNTER — Encounter: Payer: Self-pay | Admitting: Podiatry

## 2019-07-10 NOTE — Telephone Encounter (Signed)
Pt called states she is returning my call concerning her pain medication. I left message informing pt is needed to know the status of her foot pain so Dr. Amalia Hailey would know how to proceed.

## 2019-07-10 NOTE — Telephone Encounter (Signed)
Left message on pt's mobile phone informing pt that I needed more information to give Dr. Amalia Hailey concerning the pain in her ankle and leg she was experiencing. Left message on home phone informing pt I needed more information concerning the pain in her leg and ankle.

## 2019-07-11 ENCOUNTER — Telehealth: Payer: Self-pay | Admitting: Podiatry

## 2019-07-11 NOTE — Telephone Encounter (Signed)
Pain is in a lot of pain from surgery and needs to speak with you

## 2019-07-12 ENCOUNTER — Encounter: Payer: Medicaid Other | Admitting: Podiatry

## 2019-07-12 ENCOUNTER — Other Ambulatory Visit: Payer: Self-pay | Admitting: Emergency Medicine

## 2019-07-12 ENCOUNTER — Other Ambulatory Visit (HOSPITAL_COMMUNITY): Payer: Self-pay | Admitting: Emergency Medicine

## 2019-07-12 NOTE — Telephone Encounter (Addendum)
Pt called 07/12/2019 and is scheduled to be seen in office tomorrow. Scheduler A. Tamala Julian states there is an appt today at 11:15am tomorrow. I attempted to contact pt and she called while I was on the phone.Scheduler Myles Rosenthal offered pt an appt today and pt stated she could not get a ride. I spoke with pt and she states the left leg is so painful, and swollen, she can't bend it and it cramps constantly. I told pt that she needed to go to the ED, she may have a blood clot in that leg, and she needed to contact her emergency ride to go now. Pt states she will do that immediately.

## 2019-07-13 ENCOUNTER — Encounter: Payer: Medicaid Other | Admitting: Podiatry

## 2019-07-16 ENCOUNTER — Ambulatory Visit (INDEPENDENT_AMBULATORY_CARE_PROVIDER_SITE_OTHER): Payer: Medicaid Other | Admitting: Psychiatry

## 2019-07-16 ENCOUNTER — Encounter (HOSPITAL_COMMUNITY): Payer: Self-pay | Admitting: Psychiatry

## 2019-07-16 ENCOUNTER — Other Ambulatory Visit: Payer: Self-pay

## 2019-07-16 DIAGNOSIS — F331 Major depressive disorder, recurrent, moderate: Secondary | ICD-10-CM | POA: Diagnosis not present

## 2019-07-16 DIAGNOSIS — F4310 Post-traumatic stress disorder, unspecified: Secondary | ICD-10-CM

## 2019-07-16 MED ORDER — FLUOXETINE HCL 40 MG PO CAPS: 40.0000 mg | ORAL_CAPSULE | Freq: Every day | ORAL | 0 refills | Status: DC

## 2019-07-16 NOTE — Patient Instructions (Signed)
1. Continue fluoxetine 40 mg daily  2. Next appointment:  in two months

## 2019-07-18 ENCOUNTER — Other Ambulatory Visit: Payer: Self-pay

## 2019-07-19 ENCOUNTER — Ambulatory Visit: Payer: Medicaid Other | Admitting: Family Medicine

## 2019-07-19 ENCOUNTER — Telehealth (HOSPITAL_COMMUNITY): Payer: Self-pay | Admitting: Psychiatry

## 2019-07-19 ENCOUNTER — Ambulatory Visit (HOSPITAL_COMMUNITY): Payer: Medicaid Other | Admitting: Psychiatry

## 2019-07-19 ENCOUNTER — Encounter: Payer: Self-pay | Admitting: Family Medicine

## 2019-07-19 VITALS — BP 124/78 | HR 95 | Temp 98.0°F | Resp 20 | Ht 61.0 in | Wt 215.0 lb

## 2019-07-19 DIAGNOSIS — I152 Hypertension secondary to endocrine disorders: Secondary | ICD-10-CM

## 2019-07-19 DIAGNOSIS — E1169 Type 2 diabetes mellitus with other specified complication: Secondary | ICD-10-CM | POA: Diagnosis not present

## 2019-07-19 DIAGNOSIS — R3915 Urgency of urination: Secondary | ICD-10-CM

## 2019-07-19 DIAGNOSIS — Z794 Long term (current) use of insulin: Secondary | ICD-10-CM

## 2019-07-19 DIAGNOSIS — Z23 Encounter for immunization: Secondary | ICD-10-CM

## 2019-07-19 DIAGNOSIS — E1165 Type 2 diabetes mellitus with hyperglycemia: Secondary | ICD-10-CM

## 2019-07-19 DIAGNOSIS — M545 Low back pain, unspecified: Secondary | ICD-10-CM

## 2019-07-19 DIAGNOSIS — E1159 Type 2 diabetes mellitus with other circulatory complications: Secondary | ICD-10-CM

## 2019-07-19 DIAGNOSIS — N3001 Acute cystitis with hematuria: Secondary | ICD-10-CM

## 2019-07-19 DIAGNOSIS — E785 Hyperlipidemia, unspecified: Secondary | ICD-10-CM

## 2019-07-19 DIAGNOSIS — G8929 Other chronic pain: Secondary | ICD-10-CM

## 2019-07-19 DIAGNOSIS — M5412 Radiculopathy, cervical region: Secondary | ICD-10-CM

## 2019-07-19 DIAGNOSIS — I10 Essential (primary) hypertension: Secondary | ICD-10-CM

## 2019-07-19 LAB — URINALYSIS, COMPLETE
Bilirubin, UA: NEGATIVE
Ketones, UA: NEGATIVE
Nitrite, UA: NEGATIVE
Specific Gravity, UA: 1.02 (ref 1.005–1.030)
Urobilinogen, Ur: 0.2 mg/dL (ref 0.2–1.0)
pH, UA: 5.5 (ref 5.0–7.5)

## 2019-07-19 LAB — MICROSCOPIC EXAMINATION
Epithelial Cells (non renal): >10 /hpf — AB (ref 0–10)
Renal Epithel, UA: NONE SEEN /hpf

## 2019-07-19 MED ORDER — SULFAMETHOXAZOLE-TRIMETHOPRIM 800-160 MG PO TABS: 1.0000 | ORAL_TABLET | Freq: Two times a day (BID) | ORAL | 0 refills | Status: AC

## 2019-07-19 MED ORDER — FLUCONAZOLE 150 MG PO TABS: ORAL_TABLET | ORAL | 0 refills | Status: DC

## 2019-07-19 NOTE — Progress Notes (Signed)
Subjective:  Patient ID: Lisa Norton, female    DOB: 1970/11/20, 48 y.o.   MRN: [id number]  Patient Care Team: Baruch Gouty, FNP as PCP - General (Family Medicine) Michael Boston, MD as Consulting Physician (General Surgery) Marti Sleigh, MD as Consulting Physician (Gynecology) Retia Cordle, Connye Burkitt, FNP (Family Medicine)   Chief Complaint:  Medical Management of Chronic Issues (3 mo ), Diabetes, and Hypertension   HPI: Lisa Norton is a 48 y.o. female presenting on 07/19/2019 for Medical Management of Chronic Issues (3 mo ), Diabetes, and Hypertension  1. Type 2 diabetes mellitus with hyperglycemia, with long-term current use of insulin (HCC) Pt states she has been taking medications as prescribed. States she did have a blood sugar reading in the low 300 range. States this was before her medications. States after she took her medication it came down to 160. She denies polydipsia, polyuria, or polyphagia. Does not exercise on a regular basis and does not follow a strict diet.   2. Hypertension associated with type 2 diabetes mellitus ([place]) Compliant with medications. No chest pain, leg swelling, palpitations, headaches, shortness of breath, dizziness, weakness, or confusion. Does try to limit salt intake.   3. Hyperlipidemia associated with type 2 diabetes mellitus ([place]) Compliant with medications without associated side effects. Does not follow a strict diet or exercise on a regular basis.   4. Urgency of urination Urgency and dysuria for 4 days. Worsening over last 2 days. No hematuria, flank pain, abdominal pain, fever, chills, weakness, or confusion. Has not tired anything for the symptoms. States the voids every 20-30 minutes but only small amounts at a time.   5. Chronic bilateral low back pain without sciatica 6. C7 radiculopathy Ongoing lower back and neck pain. Was to follow up with neurosurgery but has not received an appointment. Would like new referral as  symptoms are becoming more cumbersome. Daily pain that is minimally controlled with Mobic, Gabapentin, and Flexeril.      Relevant past medical, surgical, family, and social history reviewed and updated as indicated.  Allergies and medications reviewed and updated. Date reviewed: Chart in Epic.   Past Medical History:  Diagnosis Date  . Anxiety   . COLD SORE 05/15/2010  . DEPRESSION 12/12/2009  . Diabetes mellitus without complication ([place])   . High cholesterol   . 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 HYPERTENSION 12/12/2009  . TOBACCO ABUSE 12/12/2009    Past Surgical History:  Procedure Laterality Date  . BLADDER SUSPENSION    . CERVICAL FUSION    . CESAREAN SECTION WITH BILATERAL TUBAL LIGATION Bilateral 02/12/2015   Procedure: CESAREAN SECTION;  Surgeon: Florian Buff, MD;  Location: [facility];  Service: Obstetrics;  Laterality: Bilateral;  Fetal Demise  . CHOLECYSTECTOMY  2006  . EVALUATION UNDER ANESTHESIA WITH HEMORRHOIDECTOMY N/A 10/12/2018   Procedure: LATERAL INTERNAL HEMORRHOID LIGATION/PEXY ANORECTAL EXAM UNDER ANESTHESIA WITH HEMORRHOIDECTOMY X2;  Surgeon: Michael Boston, MD;  Location: [facility];  Service: General;  Laterality: N/A;  . FOOT SURGERY Right    Plantar, bone spurs  . FOOT SURGERY Bilateral 2019 and 2020   11/2018, 05/31/2019  . LAPAROSCOPIC VAGINAL HYSTERECTOMY WITH SALPINGECTOMY  02/16/2018   [facility]  . TONSILLECTOMY  1988    Social History   Socioeconomic History  . Marital status: Legally Separated    Spouse name: Not on file  . Number of children: 2  . Years of education: Not on file  . Highest education  level: Not on file  Occupational History  . Not on file  Social Needs  . Financial resource strain: Not on file  . Food insecurity    Worry: Not on file    Inability: Not on file  . Transportation needs    Medical: Not on file    Non-medical: Not on file  Tobacco Use  . Smoking status: Current Every Day Smoker    Packs/day:  1.50    Years: 20.00    Pack years: 30.00    Types: Cigarettes  . Smokeless tobacco: Former Systems developer    Types: Snuff    Quit date: 05/05/1986  Substance and Sexual Activity  . Alcohol use: Yes    Comment: had a bout of heavy drinking 2015, drinks occasionally now   . Drug use: Not Currently    Comment: past use of marijuana about every other day for two years, last used 25 years ago.   Marland Kitchen Sexual activity: Yes    Birth control/protection: None, Surgical    Comment: pt states she did not have a tubal  Lifestyle  . Physical activity    Days per week: Not on file    Minutes per session: Not on file  . Stress: Not on file  Relationships  . Social Herbalist on phone: Not on file    Gets together: Not on file    Attends religious service: Not on file    Active member of club or organization: Not on file    Attends meetings of clubs or organizations: Not on file    Relationship status: Not on file  . Intimate partner violence    Fear of current or ex partner: Not on file    Emotionally abused: Not on file    Physically abused: Not on file    Forced sexual activity: Not on file  Other Topics Concern  . Not on file  Social History Narrative  . Not on file    Outpatient Encounter Medications as of 07/19/2019  Medication Sig  . Accu-Chek FastClix Lancets MISC 4 TIMES DAILY  . ACCU-CHEK GUIDE test strip 4 TIMES DAILY  . cetirizine (ZYRTEC) 10 MG tablet Take 1 tablet (10 mg total) by mouth daily.  . clobetasol cream (TEMOVATE) 0.05 % APPLY AT BEDTIME  . clotrimazole (CLOTRIMAZOLE ATHLETES FOOT) 1 % cream Apply 1 application topically 2 (two) times daily.  . cyclobenzaprine (FLEXERIL) 10 MG tablet Take 1 tablet (10 mg total) by mouth 3 (three) times daily as needed for muscle spasms.  . dapagliflozin propanediol (FARXIGA) 10 MG TABS tablet Take 10 mg by mouth daily.  . famotidine (PEPCID) 20 MG tablet Take 1 tablet (20 mg total) by mouth 2 (two) times daily.  Derrill Memo ON  09/17/2019] FLUoxetine (PROZAC) 40 MG capsule Take 1 capsule (40 mg total) by mouth daily.  . fluticasone (FLONASE) 50 MCG/ACT nasal spray Place 2 sprays into both nostrils daily.  Marland Kitchen gabapentin (NEURONTIN) 300 MG capsule Take 1 capsule (300 mg total) by mouth 2 (two) times daily. Increase to 4x/day as needed  . hydrOXYzine (VISTARIL) 50 MG capsule TAKE 2 CAPSULES 3 TIMES DAILY AS NEEDED  . insulin detemir (LEVEMIR) 100 UNIT/ML injection INJECT 0.2ML (20 UNITS TOTAL) SQ AT BEDTIME  . levocetirizine (XYZAL) 5 MG tablet Take 1 tablet (5 mg total) by mouth every evening.  . liraglutide (VICTOZA) 18 MG/3ML SOPN Inject 0.3 mLs (1.8 mg total) into the skin daily.  . magnesium hydroxide (MILK OF  MAGNESIA) 400 MG/5ML suspension Take 5-10 mLs by mouth daily as needed for mild constipation.  . meloxicam (MOBIC) 15 MG tablet TAKE ONE (1) TABLET EACH DAY  . mupirocin ointment (BACTROBAN) 2 % Apply 1 application topically 2 (two) times daily.  . polyethylene glycol powder (GLYCOLAX/MIRALAX) powder Take 17 g by mouth 2 (two) times daily as needed. (Patient taking differently: Take 17 g by mouth 2 (two) times daily as needed for moderate constipation. )  . pravastatin (PRAVACHOL) 80 MG tablet TAKE ONE (1) TABLET EACH DAY  . ULTICARE MICRO PEN NEEDLES 32G X 4 MM MISC 3 TIMES DAILY  . EPINEPHrine (EPIPEN 2-PAK) 0.3 mg/0.3 mL IJ SOAJ injection Inject 0.3 mLs (0.3 mg total) into the muscle as needed for anaphylaxis. (Patient not taking: Reported on 07/19/2019)  . fluconazole (DIFLUCAN) 150 MG tablet 1 po q week x 4 weeks  . Oxycodone HCl 10 MG TABS Take 10 mg by mouth 4 (four) times daily as needed.  . sulfamethoxazole-trimethoprim (BACTRIM DS) 800-160 MG tablet Take 1 tablet by mouth 2 (two) times daily for 7 days.  . [DISCONTINUED] linaclotide (LINZESS) 72 MCG capsule Take 72 mcg by mouth daily before breakfast.   No facility-administered encounter medications on file as of 07/19/2019.     Allergies  Allergen  Reactions  . Metformin And Related Diarrhea    Review of Systems  Constitutional: Negative for activity change, appetite change, chills, diaphoresis, fatigue, fever and unexpected weight change.  HENT: Negative.   Eyes: Negative.  Negative for photophobia and visual disturbance.  Respiratory: Negative for cough, chest tightness and shortness of breath.   Cardiovascular: Negative for chest pain, palpitations and leg swelling.  Gastrointestinal: Negative for abdominal pain, blood in stool, constipation, diarrhea, nausea and vomiting.  Endocrine: Negative.  Negative for cold intolerance, heat intolerance, polydipsia, polyphagia and polyuria.  Genitourinary: Positive for dysuria, frequency and urgency. Negative for decreased urine volume, difficulty urinating, dyspareunia, enuresis, flank pain, genital sores, hematuria, menstrual problem, pelvic pain, vaginal bleeding, vaginal discharge and vaginal pain.  Musculoskeletal: Positive for arthralgias, back pain, gait problem, myalgias, neck pain and neck stiffness.  Skin: Negative.  Negative for color change and rash.  Allergic/Immunologic: Negative.   Neurological: Negative for dizziness, tremors, seizures, syncope, facial asymmetry, speech difficulty, weakness, light-headedness, numbness and headaches.  Hematological: Negative.   Psychiatric/Behavioral: Negative for confusion, hallucinations, sleep disturbance and suicidal ideas.  All other systems reviewed and are negative.       Objective:  BP 124/78   Pulse 95   Temp 98 F (36.7 C)   Resp 20   Ht 5\' 1"  (1.549 m)   Wt 215 lb (97.5 kg)   LMP 09/07/2017   SpO2 97%   BMI 40.62 kg/m    Wt Readings from Last 3 Encounters:  07/19/19 215 lb (97.5 kg)  05/17/19 215 lb (97.5 kg)  05/09/19 215 lb (97.5 kg)    Physical Exam Vitals signs and nursing note reviewed.  Constitutional:      General: She is not in acute distress.    Appearance: Normal appearance. She is well-developed and  well-groomed. She is morbidly obese. She is not ill-appearing, toxic-appearing or diaphoretic.  HENT:     Head: Normocephalic and atraumatic.     Jaw: There is normal jaw occlusion.     Right Ear: Hearing, tympanic membrane, ear canal and external ear normal.     Left Ear: Hearing, tympanic membrane, ear canal and external ear normal.     Nose:  Nose normal.     Mouth/Throat:     Lips: Pink.     Mouth: Mucous membranes are moist.     Pharynx: Oropharynx is clear. Uvula midline.  Eyes:     General: Lids are normal.     Extraocular Movements: Extraocular movements intact.     Conjunctiva/sclera: Conjunctivae normal.     Pupils: Pupils are equal, round, and reactive to light.  Neck:     Musculoskeletal: Normal range of motion and neck supple.     Thyroid: No thyroid mass, thyromegaly or thyroid tenderness.     Vascular: No carotid bruit or JVD.     Trachea: Trachea and phonation normal.  Cardiovascular:     Rate and Rhythm: Normal rate and regular rhythm.     Chest Wall: PMI is not displaced.     Pulses: Normal pulses.     Heart sounds: Normal heart sounds. No murmur. No friction rub. No gallop.   Pulmonary:     Effort: Pulmonary effort is normal. No respiratory distress.     Breath sounds: Normal breath sounds. No wheezing.  Abdominal:     General: Bowel sounds are normal. There is no distension or abdominal bruit.     Palpations: Abdomen is soft. There is no hepatomegaly or splenomegaly.     Tenderness: There is no abdominal tenderness. There is no right CVA tenderness or left CVA tenderness.     Hernia: No hernia is present.  Musculoskeletal:     Right shoulder: Normal.     Left shoulder: Normal.     Cervical back: She exhibits decreased range of motion, tenderness and pain. She exhibits no bony tenderness, no swelling, no edema, no deformity, no laceration, no spasm and normal pulse.     Thoracic back: Normal.     Lumbar back: She exhibits decreased range of motion, tenderness  and pain. She exhibits no bony tenderness, no swelling, no edema, no deformity, no laceration, no spasm and normal pulse.     Right lower leg: No edema.     Left lower leg: No edema.     Left foot: Decreased range of motion (recent surgery, healing surgical scars without erythema or drainage). Normal capillary refill. Tenderness present. No bony tenderness, swelling, crepitus, deformity or laceration.  Lymphadenopathy:     Cervical: No cervical adenopathy.  Skin:    General: Skin is warm and dry.     Capillary Refill: Capillary refill takes less than 2 seconds.     Coloration: Skin is not cyanotic, jaundiced or pale.     Findings: No rash.  Neurological:     General: No focal deficit present.     Mental Status: She is alert and oriented to person, place, and time.     Cranial Nerves: Cranial nerves are intact. No cranial nerve deficit.     Sensory: Sensation is intact. No sensory deficit.     Motor: Motor function is intact. No weakness.     Coordination: Coordination is intact. Coordination normal.     Gait: Gait abnormal (using assitive device due to recent left foot surgery).     Deep Tendon Reflexes: Reflexes are normal and symmetric. Reflexes normal.  Psychiatric:        Attention and Perception: Attention and perception normal.        Mood and Affect: Mood and affect normal.        Speech: Speech normal.        Behavior: Behavior normal. Behavior is cooperative.  Thought Content: Thought content normal.        Cognition and Memory: Cognition and memory normal.        Judgment: Judgment normal.     Results for orders placed or performed in visit on 07/19/19  Urine culture   Specimen: Urine   UR  Result Value Ref Range   Urine Culture, Routine Final report (A)    Organism ID, Bacteria Escherichia coli (A)    Antimicrobial Susceptibility Comment   Microscopic Examination   URINE  Result Value Ref Range   WBC, UA 11-30 (A) 0 - 5 /hpf   RBC 11-30 (A) 0 - 2 /hpf    Epithelial Cells (non renal) >10 (A) 0 - 10 /hpf   Renal Epithel, UA None seen None seen /hpf   Mucus, UA Present Not Estab.   Bacteria, UA Many (A) None seen/Few   Yeast, UA Present None seen  urinalysis- dip and micro  Result Value Ref Range   Specific Gravity, UA 1.020 1.005 - 1.030   pH, UA 5.5 5.0 - 7.5   Color, UA Yellow Yellow   Appearance Ur Clear Clear   Leukocytes,UA Trace (A) Negative   Protein,UA Trace (A) Negative/Trace   Glucose, UA 2+ (A) Negative   Ketones, UA Negative Negative   RBC, UA 2+ (A) Negative   Bilirubin, UA Negative Negative   Urobilinogen, Ur 0.2 0.2 - 1.0 mg/dL   Nitrite, UA Negative Negative   Microscopic Examination See below:        Pertinent labs & imaging results that were available during my care of the patient were reviewed by me and considered in my medical decision making.  Assessment & Plan:  Tarsha was seen today for medical management of chronic issues, diabetes and hypertension.  Diagnoses and all orders for this visit:  Type 2 diabetes mellitus with hyperglycemia, with long-term current use of insulin ([place]) Labs due on 07/27/2019, pt will return to have completed. Diet and exercise encouraged. Continue current medications. Will adjust if warranted.   Hypertension associated with type 2 diabetes mellitus (HCC) BP well controlled. Changes were not made in regimen today. Goal BP is 130/80. Pt aware to report any persistent high or low readings. DASH diet and exercise encouraged. Exercise at least 150 minutes per week and increase as tolerated. Goal BMI > 25. Stress management encouraged. Avoid nicotine and tobacco product use. Avoid excessive alcohol and NSAID's. Avoid more than 2000 mg of sodium daily. Medications as prescribed. Follow up as scheduled.   Hyperlipidemia associated with type 2 diabetes mellitus ([place]) Diet encouraged - increase intake of fresh fruits and vegetables, increase intake of lean proteins. Bake, broil, or grill  foods. Avoid fried, greasy, and fatty foods. Avoid fast foods. Increase intake of fiber-rich whole grains. Exercise encouraged - at least 150 minutes per week and advance as tolerated.  Goal BMI < 25. Continue medications as prescribed. Follow up in 3-6 months as discussed.   Chronic bilateral low back pain without sciatica C7 radiculopathy Ongoing and worsening pain. Was to follow up with neurosurgery but has not received an appointment. Will place new referral today.  -     Ambulatory referral to Neurosurgery  Urgency of urination Urinalysis in office positive for yeast, many bacteria, trace leukocytes, trace protein, and 2+ blood. Will add culture.  -     Urine culture -     urinalysis- dip and micro -     Microscopic Examination  Acute cystitis with hematuria Urinalysis  indicates acute UTI with hematuria. Will initiate below. Culture pending, will change therapy if warranted. Symptomatic care discussed in detail. Repeat urinalysis in 2 weeks.  -     fluconazole (DIFLUCAN) 150 MG tablet; 1 po q week x 4 weeks -     sulfamethoxazole-trimethoprim (BACTRIM DS) 800-160 MG tablet; Take 1 tablet by mouth 2 (two) times daily for 7 days.  Need for immunization against influenza -     Flu Vaccine QUAD 36+ mos IM     Continue all other maintenance medications.  Follow up plan: Return in about 3 months (around 10/19/2019), or if symptoms worsen or fail to improve, for DM.  Continue healthy lifestyle choices, including diet (rich in fruits, vegetables, and lean proteins, and low in salt and simple carbohydrates) and exercise (at least 30 minutes of moderate physical activity daily).  Educational handout given for UTI  The above assessment and management plan was discussed with the patient. The patient verbalized understanding of and has agreed to the management plan. Patient is aware to call the clinic if they develop any new symptoms or if symptoms persist or worsen. Patient is aware when to  return to the clinic for a follow-up visit. Patient educated on when it is appropriate to go to the emergency department.   Monia Pouch, FNP-C [facility] [phone]

## 2019-07-19 NOTE — Patient Instructions (Addendum)
Urinary Tract Infection, Adult A urinary tract infection (UTI) is an infection of any part of the urinary tract. The urinary tract includes:  The kidneys.  The ureters.  The bladder.  The urethra. These organs make, store, and get rid of pee (urine) in the body. What are the causes? This is caused by germs (bacteria) in your genital area. These germs grow and cause swelling (inflammation) of your urinary tract. What increases the risk? You are more likely to develop this condition if:  You have a small, thin tube (catheter) to drain pee.  You cannot control when you pee or poop (incontinence).  You are female, and: ? You use these methods to prevent pregnancy: ? A medicine that kills sperm (spermicide). ? A device that blocks sperm (diaphragm). ? You have low levels of a female hormone (estrogen). ? You are pregnant.  You have genes that add to your risk.  You are sexually active.  You take antibiotic medicines.  You have trouble peeing because of: ? A prostate that is bigger than normal, if you are female. ? A blockage in the part of your body that drains pee from the bladder (urethra). ? A kidney stone. ? A nerve condition that affects your bladder (neurogenic bladder). ? Not getting enough to drink. ? Not peeing often enough.  You have other conditions, such as: ? Diabetes. ? A weak disease-fighting system (immune system). ? Sickle cell disease. ? Gout. ? Injury of the spine. What are the signs or symptoms? Symptoms of this condition include:  Needing to pee right away (urgently).  Peeing often.  Peeing small amounts often.  Pain or burning when peeing.  Blood in the pee.  Pee that smells bad or not like normal.  Trouble peeing.  Pee that is cloudy.  Fluid coming from the vagina, if you are female.  Pain in the belly or lower back. Other symptoms include:  Throwing up (vomiting).  No urge to eat.  Feeling mixed up (confused).  Being tired  and grouchy (irritable).  A fever.  Watery poop (diarrhea). How is this treated? This condition may be treated with:  Antibiotic medicine.  Other medicines.  Drinking enough water. Follow these instructions at home:  Medicines  Take over-the-counter and prescription medicines only as told by your doctor.  If you were prescribed an antibiotic medicine, take it as told by your doctor. Do not stop taking it even if you start to feel better. General instructions  Make sure you: ? Pee until your bladder is empty. ? Do not hold pee for a long time. ? Empty your bladder after sex. ? Wipe from front to back after pooping if you are a female. Use each tissue one time when you wipe.  Drink enough fluid to keep your pee pale yellow.  Keep all follow-up visits as told by your doctor. This is important. Contact a doctor if:  You do not get better after 1-2 days.  Your symptoms go away and then come back. Get help right away if:  You have very bad back pain.  You have very bad pain in your lower belly.  You have a fever.  You are sick to your stomach (nauseous).  You are throwing up. Summary  A urinary tract infection (UTI) is an infection of any part of the urinary tract.  This condition is caused by germs in your genital area.  There are many risk factors for a UTI. These include having a small, thin   tube to drain pee and not being able to control when you pee or poop.  Treatment includes antibiotic medicines for germs.  Drink enough fluid to keep your pee pale yellow. This information is not intended to replace advice given to you by your health care provider. Make sure you discuss any questions you have with your health care provider. Document Released: 02/02/2008 Document Revised: 08/03/2018 Document Reviewed: 02/23/2018 Elsevier Patient Education  2020 [employer]  

## 2019-07-19 NOTE — Telephone Encounter (Signed)
Therapist attempted to contact patient twice via text message through [email or web address] platform, no response.  Therapist called patient and left message indicating attempt and requesting patient call office.

## 2019-07-20 ENCOUNTER — Other Ambulatory Visit: Payer: Self-pay

## 2019-07-20 ENCOUNTER — Ambulatory Visit: Payer: Medicaid Other | Admitting: Family Medicine

## 2019-07-20 ENCOUNTER — Ambulatory Visit (HOSPITAL_COMMUNITY): Payer: Medicaid Other | Admitting: Psychiatry

## 2019-07-21 ENCOUNTER — Encounter: Payer: Self-pay | Admitting: Family Medicine

## 2019-07-21 LAB — URINE CULTURE

## 2019-07-30 ENCOUNTER — Other Ambulatory Visit: Payer: Self-pay

## 2019-07-30 ENCOUNTER — Ambulatory Visit (INDEPENDENT_AMBULATORY_CARE_PROVIDER_SITE_OTHER): Payer: Medicaid Other | Admitting: Psychiatry

## 2019-07-30 DIAGNOSIS — F331 Major depressive disorder, recurrent, moderate: Secondary | ICD-10-CM | POA: Diagnosis not present

## 2019-07-30 DIAGNOSIS — F4310 Post-traumatic stress disorder, unspecified: Secondary | ICD-10-CM | POA: Diagnosis not present
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
## 2019-08-01 ENCOUNTER — Other Ambulatory Visit: Payer: Self-pay | Admitting: Podiatry

## 2019-08-01 ENCOUNTER — Ambulatory Visit (INDEPENDENT_AMBULATORY_CARE_PROVIDER_SITE_OTHER): Payer: Medicaid Other

## 2019-08-01 ENCOUNTER — Ambulatory Visit (INDEPENDENT_AMBULATORY_CARE_PROVIDER_SITE_OTHER): Payer: Medicaid Other | Admitting: Podiatry

## 2019-08-01 ENCOUNTER — Ambulatory Visit: Payer: Self-pay

## 2019-08-01 ENCOUNTER — Other Ambulatory Visit: Payer: Self-pay

## 2019-08-01 DIAGNOSIS — M216X2 Other acquired deformities of left foot: Secondary | ICD-10-CM

## 2019-08-01 DIAGNOSIS — Z9889 Other specified postprocedural states: Secondary | ICD-10-CM

## 2019-08-01 DIAGNOSIS — M7662 Achilles tendinitis, left leg: Secondary | ICD-10-CM

## 2019-08-01 DIAGNOSIS — M7732 Calcaneal spur, left foot: Secondary | ICD-10-CM

## 2019-08-02 ENCOUNTER — Ambulatory Visit (HOSPITAL_COMMUNITY): Payer: Medicaid Other | Admitting: Psychiatry

## 2019-08-02 ENCOUNTER — Telehealth: Payer: Self-pay | Admitting: *Deleted

## 2019-08-02 DIAGNOSIS — Z9889 Other specified postprocedural states: Secondary | ICD-10-CM

## 2019-08-02 DIAGNOSIS — M7662 Achilles tendinitis, left leg: Secondary | ICD-10-CM

## 2019-08-02 DIAGNOSIS — M7732 Calcaneal spur, left foot: Secondary | ICD-10-CM

## 2019-08-02 NOTE — Telephone Encounter (Signed)
[facility] - In-office states they do not accept Medicaid.

## 2019-08-03 NOTE — Telephone Encounter (Signed)
Referral to [facility]

## 2019-08-05 NOTE — Progress Notes (Signed)
   Subjective:  Patient presents today status post posterior and inferior heel spur resection right. DOS: 05/31/2019. She reports continued intermittent pain and swelling. Standing with her foot flat increases the pain. She has been using the CAM boot as directed. Patient is here for further evaluation and treatment.   Past Medical History:  Diagnosis Date  . Anxiety   . COLD SORE 05/15/2010  . DEPRESSION 12/12/2009  . Diabetes mellitus without complication ([place])   . High cholesterol   . 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 HYPERTENSION 12/12/2009  . TOBACCO ABUSE 12/12/2009      Objective/Physical Exam Neurovascular status intact.  Skin incisions appear to be well coapted. No sign of infectious process noted. No dehiscence. No active bleeding noted. Moderate edema noted to the surgical extremity.  Radiographic Exam:  Osteotomies sites appear to be stable with routine healing.  Assessment: 1. s/p posterior and inferior heel spur resection right. DOS: 05/31/2019   Plan of Care:  1. Patient was evaluated. X-Rays reviewed.  2. Begin weightbearing in CAM boot.  3. Physical therapy ordered at [facility].  4. Return to clinic in 6 weeks.      Edrick Kins, DPM [facility]  Dr. Edrick Kins, [address]                                        [place], [place] [address]                Office [phone]  Fax [phone]

## 2019-08-14 ENCOUNTER — Other Ambulatory Visit: Payer: Self-pay

## 2019-08-14 ENCOUNTER — Ambulatory Visit (INDEPENDENT_AMBULATORY_CARE_PROVIDER_SITE_OTHER): Payer: Medicaid Other | Admitting: Psychiatry

## 2019-08-14 DIAGNOSIS — F4310 Post-traumatic stress disorder, unspecified: Secondary | ICD-10-CM

## 2019-08-14 DIAGNOSIS — F331 Major depressive disorder, recurrent, moderate: Secondary | ICD-10-CM | POA: Diagnosis not present
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
## 2019-08-16 ENCOUNTER — Other Ambulatory Visit: Payer: Self-pay

## 2019-08-17 ENCOUNTER — Encounter: Payer: Self-pay | Admitting: Family Medicine

## 2019-08-17 ENCOUNTER — Ambulatory Visit: Payer: Medicaid Other | Admitting: Family Medicine

## 2019-08-17 VITALS — BP 130/82 | HR 88 | Temp 98.9°F | Resp 20 | Ht 61.0 in | Wt 215.0 lb

## 2019-08-17 DIAGNOSIS — M545 Low back pain, unspecified: Secondary | ICD-10-CM

## 2019-08-17 DIAGNOSIS — E1140 Type 2 diabetes mellitus with diabetic neuropathy, unspecified: Secondary | ICD-10-CM

## 2019-08-17 DIAGNOSIS — M79604 Pain in right leg: Secondary | ICD-10-CM | POA: Insufficient documentation

## 2019-08-17 DIAGNOSIS — I10 Essential (primary) hypertension: Secondary | ICD-10-CM

## 2019-08-17 DIAGNOSIS — E1165 Type 2 diabetes mellitus with hyperglycemia: Secondary | ICD-10-CM | POA: Diagnosis not present

## 2019-08-17 DIAGNOSIS — E1159 Type 2 diabetes mellitus with other circulatory complications: Secondary | ICD-10-CM

## 2019-08-17 DIAGNOSIS — E785 Hyperlipidemia, unspecified: Secondary | ICD-10-CM

## 2019-08-17 DIAGNOSIS — Z794 Long term (current) use of insulin: Secondary | ICD-10-CM

## 2019-08-17 DIAGNOSIS — M79605 Pain in left leg: Secondary | ICD-10-CM

## 2019-08-17 DIAGNOSIS — E1169 Type 2 diabetes mellitus with other specified complication: Secondary | ICD-10-CM | POA: Diagnosis not present

## 2019-08-17 DIAGNOSIS — G8929 Other chronic pain: Secondary | ICD-10-CM

## 2019-08-17 DIAGNOSIS — K219 Gastro-esophageal reflux disease without esophagitis: Secondary | ICD-10-CM

## 2019-08-17 LAB — BAYER DCA HB A1C WAIVED: HB A1C (BAYER DCA - WAIVED): 9.5 % — ABNORMAL HIGH (ref <7.0)

## 2019-08-17 MED ORDER — FAMOTIDINE 20 MG PO TABS: 20.0000 mg | ORAL_TABLET | Freq: Two times a day (BID) | ORAL | 5 refills | Status: DC

## 2019-08-17 MED ORDER — INSULIN DETEMIR 100 UNIT/ML ~~LOC~~ SOLN: SUBCUTANEOUS | 4 refills | Status: DC

## 2019-08-17 MED ORDER — GABAPENTIN 300 MG PO CAPS: 300.0000 mg | ORAL_CAPSULE | Freq: Three times a day (TID) | ORAL | 5 refills | Status: DC

## 2019-08-17 MED ORDER — PRAVASTATIN SODIUM 80 MG PO TABS: ORAL_TABLET | ORAL | 5 refills | Status: DC

## 2019-08-17 MED ORDER — DAPAGLIFLOZIN PROPANEDIOL 10 MG PO TABS: 10.0000 mg | ORAL_TABLET | Freq: Every day | ORAL | 4 refills | Status: DC

## 2019-08-17 MED ORDER — VICTOZA 18 MG/3ML ~~LOC~~ SOPN: 1.8000 mg | PEN_INJECTOR | Freq: Every day | SUBCUTANEOUS | 6 refills | Status: DC

## 2019-08-17 NOTE — Progress Notes (Signed)
Subjective:  Patient ID: Lisa Norton, female    DOB: 01/31/1971, 48 y.o.   MRN: [id number]  Patient Care Team: Baruch Gouty, FNP as PCP - General (Family Medicine) Michael Boston, MD as Consulting Physician (General Surgery) Marti Sleigh, MD as Consulting Physician (Gynecology) Akemi Overholser, Connye Burkitt, FNP (Family Medicine)   Chief Complaint:  Medical Management of Chronic Issues (3 mo ), Diabetes, and Hyperlipidemia   HPI: Lisa Norton is a 48 y.o. female presenting on 08/17/2019 for Medical Management of Chronic Issues (3 mo ), Diabetes, and Hyperlipidemia   1. Type 2 diabetes mellitus with hyperglycemia, with long-term current use of insulin ([place]) Patient with longstanding type 2 diabetes that has not been well controlled.  Patient does not watch diet or exercise on a regular basis.  Patient is compliant with medications but is not compliant with checking blood sugars regularly.  States she has not checked her blood sugar in a few days.  She denies polyuria, polyphagia, or polydipsia.  Her eye exam is up-to-date, we have requested records several times and have not received them.  We will request records again today.  Current medications include Farxiga, Levemir, and Victoza.  2. Hypertension associated with type 2 diabetes mellitus ([place]) Blood pressure has been adequately controlled with diet.  She denies headaches, visual changes, dizziness, chest pain, palpitations, confusion, or lower extremity swelling.  3. Hyperlipidemia associated with type 2 diabetes mellitus ([place]) On statin therapy.  Takes as prescribed without associated side effects.  She does not watch diet or exercise on a regular basis.  4. Chronic bilateral low back pain without sciatica 5. Pain in both lower extremities Patient reports ongoing arthralgias and myalgias in lower back and both lower extremities.  This has been present for more than 6 months and seems to wax and wane.  No known injury.  Activity,  weightbearing, and certain movements worsen this pain.  She does take gabapentin for neuropathy, Mobic for anti-inflammatory properties, and is on oxycodone as needed for pain.  She is concerned about a possible underlying autoimmune cause.  Had an ANA at the beginning of the year that was negative.  6. Morbid obesity ([place]) Patient does not diet or exercise on a regular basis.  She has had recent foot surgery and is unable to exercise on a regular basis due to pain and healing.  She is still in a walking boot at this time.  7. Neuropathy due to type 2 diabetes mellitus ([place]) Patient has diabetic neuropathy in bilateral lower extremities.  This pain is worse during the day.  She has been taking gabapentin 300 mg twice daily without complete relief of this pain.  She tolerates the medication well without associated side effects.  She states the pain is sharp, shooting, and stabbing in nature.  States at times her feet feel like they are on fire.  8 out of 10 at worst.   8. Gastroesophageal reflux disease without esophagitis Longstanding history of GERD.  Has been taking famotidine with great relief of symptoms.  No dysphagia, voice changes, cough, water brash, sore throat, choking sensation, hemoptysis, abdominal pain, nausea, or vomiting.  No significant unexplained weight changes.     Relevant past medical, surgical, family, and social history reviewed and updated as indicated.  Allergies and medications reviewed and updated. Date reviewed: Chart in Epic.   Past Medical History:  Diagnosis Date  . Anxiety   . COLD SORE 05/15/2010  . DEPRESSION 12/12/2009  .  Diabetes mellitus without complication ([place])   . High cholesterol   . 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 HYPERTENSION 12/12/2009  . TOBACCO ABUSE 12/12/2009    Past Surgical History:  Procedure Laterality Date  . BLADDER SUSPENSION    . CERVICAL FUSION    . CESAREAN SECTION WITH BILATERAL TUBAL LIGATION Bilateral 02/12/2015   Procedure:  CESAREAN SECTION;  Surgeon: Florian Buff, MD;  Location: [facility];  Service: Obstetrics;  Laterality: Bilateral;  Fetal Demise  . CHOLECYSTECTOMY  2006  . EVALUATION UNDER ANESTHESIA WITH HEMORRHOIDECTOMY N/A 10/12/2018   Procedure: LATERAL INTERNAL HEMORRHOID LIGATION/PEXY ANORECTAL EXAM UNDER ANESTHESIA WITH HEMORRHOIDECTOMY X2;  Surgeon: Michael Boston, MD;  Location: [facility];  Service: General;  Laterality: N/A;  . FOOT SURGERY Right    Plantar, bone spurs  . FOOT SURGERY Bilateral 2019 and 2020   11/2018, 05/31/2019  . LAPAROSCOPIC VAGINAL HYSTERECTOMY WITH SALPINGECTOMY  02/16/2018   [facility]  . TONSILLECTOMY  1988    Social History   Socioeconomic History  . Marital status: Legally Separated    Spouse name: Not on file  . Number of children: 2  . Years of education: Not on file  . Highest education level: Not on file  Occupational History  . Not on file  Tobacco Use  . Smoking status: Current Every Day Smoker    Packs/day: 1.50    Years: 20.00    Pack years: 30.00    Types: Cigarettes  . Smokeless tobacco: Former Systems developer    Types: Snuff    Quit date: 05/05/1986  Substance and Sexual Activity  . Alcohol use: Yes    Comment: had a bout of heavy drinking 2015, drinks occasionally now   . Drug use: Not Currently    Comment: past use of marijuana about every other day for two years, last used 25 years ago.   Marland Kitchen Sexual activity: Yes    Birth control/protection: None, Surgical    Comment: pt states she did not have a tubal  Other Topics Concern  . Not on file  Social History Narrative  . Not on file   Social Determinants of Health   Financial Resource Strain:   . Difficulty of Paying Living Expenses: Not on file  Food Insecurity:   . Worried About Charity fundraiser in the Last Year: Not on file  . Ran Out of Food in the Last Year: Not on file  Transportation Needs:   . Lack of Transportation (Medical): Not on file  . Lack of Transportation  (Non-Medical): Not on file  Physical Activity:   . Days of Exercise per Week: Not on file  . Minutes of Exercise per Session: Not on file  Stress:   . Feeling of Stress : Not on file  Social Connections:   . Frequency of Communication with Friends and Family: Not on file  . Frequency of Social Gatherings with Friends and Family: Not on file  . Attends Religious Services: Not on file  . Active Member of Clubs or Organizations: Not on file  . Attends Archivist Meetings: Not on file  . Marital Status: Not on file  Intimate Partner Violence:   . Fear of Current or Ex-Partner: Not on file  . Emotionally Abused: Not on file  . Physically Abused: Not on file  . Sexually Abused: Not on file    Outpatient Encounter Medications as of 08/17/2019  Medication Sig  . Accu-Chek FastClix Lancets MISC 4 TIMES DAILY  .  ACCU-CHEK GUIDE test strip 4 TIMES DAILY  . cetirizine (ZYRTEC) 10 MG tablet Take 1 tablet (10 mg total) by mouth daily.  . clobetasol cream (TEMOVATE) 0.05 % APPLY AT BEDTIME  . clotrimazole (CLOTRIMAZOLE ATHLETES FOOT) 1 % cream Apply 1 application topically 2 (two) times daily.  . dapagliflozin propanediol (FARXIGA) 10 MG TABS tablet Take 10 mg by mouth daily.  . famotidine (PEPCID) 20 MG tablet Take 1 tablet (20 mg total) by mouth 2 (two) times daily.  Derrill Memo ON 09/17/2019] FLUoxetine (PROZAC) 40 MG capsule Take 1 capsule (40 mg total) by mouth daily.  . fluticasone (FLONASE) 50 MCG/ACT nasal spray Place 2 sprays into both nostrils daily.  Marland Kitchen gabapentin (NEURONTIN) 300 MG capsule Take 1 capsule (300 mg total) by mouth 3 (three) times daily. Increase to 4x/day as needed  . hydrOXYzine (VISTARIL) 50 MG capsule TAKE 2 CAPSULES 3 TIMES DAILY AS NEEDED  . insulin detemir (LEVEMIR) 100 UNIT/ML injection INJECT 0.3ML (30 UNITS TOTAL) SQ AT BEDTIME  . levocetirizine (XYZAL) 5 MG tablet Take 1 tablet (5 mg total) by mouth every evening.  . liraglutide (VICTOZA) 18 MG/3ML SOPN  Inject 0.3 mLs (1.8 mg total) into the skin daily.  . magnesium hydroxide (MILK OF MAGNESIA) 400 MG/5ML suspension Take 5-10 mLs by mouth daily as needed for mild constipation.  . mupirocin ointment (BACTROBAN) 2 % Apply 1 application topically 2 (two) times daily.  . Oxycodone HCl 10 MG TABS Take 10 mg by mouth 4 (four) times daily as needed.  . pravastatin (PRAVACHOL) 80 MG tablet TAKE ONE (1) TABLET EACH DAY  . ULTICARE MICRO PEN NEEDLES 32G X 4 MM MISC 3 TIMES DAILY  . [DISCONTINUED] dapagliflozin propanediol (FARXIGA) 10 MG TABS tablet Take 10 mg by mouth daily.  . [DISCONTINUED] famotidine (PEPCID) 20 MG tablet Take 1 tablet (20 mg total) by mouth 2 (two) times daily.  . [DISCONTINUED] gabapentin (NEURONTIN) 300 MG capsule Take 1 capsule (300 mg total) by mouth 2 (two) times daily. Increase to 4x/day as needed  . [DISCONTINUED] insulin detemir (LEVEMIR) 100 UNIT/ML injection INJECT 0.2ML (20 UNITS TOTAL) SQ AT BEDTIME  . [DISCONTINUED] liraglutide (VICTOZA) 18 MG/3ML SOPN Inject 0.3 mLs (1.8 mg total) into the skin daily.  . [DISCONTINUED] polyethylene glycol powder (GLYCOLAX/MIRALAX) powder Take 17 g by mouth 2 (two) times daily as needed. (Patient taking differently: Take 17 g by mouth 2 (two) times daily as needed for moderate constipation. )  . [DISCONTINUED] pravastatin (PRAVACHOL) 80 MG tablet TAKE ONE (1) TABLET EACH DAY  . EPINEPHrine (EPIPEN 2-PAK) 0.3 mg/0.3 mL IJ SOAJ injection Inject 0.3 mLs (0.3 mg total) into the muscle as needed for anaphylaxis. (Patient not taking: Reported on 07/19/2019)  . [DISCONTINUED] cyclobenzaprine (FLEXERIL) 10 MG tablet Take 1 tablet (10 mg total) by mouth 3 (three) times daily as needed for muscle spasms.  . [DISCONTINUED] fluconazole (DIFLUCAN) 150 MG tablet 1 po q week x 4 weeks  . [DISCONTINUED] meloxicam (MOBIC) 15 MG tablet TAKE ONE (1) TABLET EACH DAY   No facility-administered encounter medications on file as of 08/17/2019.    Allergies    Allergen Reactions  . Metformin And Related Diarrhea    Review of Systems  Constitutional: Negative for activity change, appetite change, chills, diaphoresis, fatigue, fever and unexpected weight change.  HENT: Negative.  Negative for congestion, sore throat, trouble swallowing and voice change.   Eyes: Negative.  Negative for photophobia and visual disturbance.  Respiratory: Negative for cough, choking, chest  tightness and shortness of breath.   Cardiovascular: Negative for chest pain, palpitations and leg swelling.  Gastrointestinal: Negative for abdominal distention, abdominal pain, anal bleeding, blood in stool, constipation, diarrhea, nausea and vomiting.  Endocrine: Negative.  Negative for cold intolerance, heat intolerance, polydipsia, polyphagia and polyuria.  Genitourinary: Negative for decreased urine volume, difficulty urinating, dysuria, flank pain, frequency and urgency.  Musculoskeletal: Positive for arthralgias, back pain, gait problem (in walking boot) and myalgias.  Skin: Negative.  Negative for color change and rash.  Allergic/Immunologic: Negative.   Neurological: Negative for dizziness and headaches.  Hematological: Negative.   Psychiatric/Behavioral: Negative for confusion, hallucinations, sleep disturbance and suicidal ideas.  All other systems reviewed and are negative.       Objective:  BP 130/82   Pulse 88   Temp 98.9 F (37.2 C)   Resp 20   Ht _0  (1.549 m)   Wt 215 lb (97.5 kg)   LMP 09/07/2017   SpO2 97%   BMI 40.62 kg/m    Wt Readings from Last 3 Encounters:  08/17/19 215 lb (97.5 kg)  07/19/19 215 lb (97.5 kg)  05/17/19 215 lb (97.5 kg)    Physical Exam Vitals and nursing note reviewed.  Constitutional:      General: She is not in acute distress.    Appearance: Normal appearance. She is well-developed and well-groomed. She is morbidly obese. She is not ill-appearing, toxic-appearing or diaphoretic.  HENT:     Head: Normocephalic and  atraumatic.     Jaw: There is normal jaw occlusion.     Right Ear: Hearing normal.     Left Ear: Hearing normal.     Nose: Nose normal.     Mouth/Throat:     Lips: Pink.     Mouth: Mucous membranes are moist.     Pharynx: Oropharynx is clear. Uvula midline.  Eyes:     General: Lids are normal.     Extraocular Movements: Extraocular movements intact.     Conjunctiva/sclera: Conjunctivae normal.     Pupils: Pupils are equal, round, and reactive to light.  Neck:     Thyroid: No thyroid mass, thyromegaly or thyroid tenderness.     Vascular: No carotid bruit or JVD.     Trachea: Trachea and phonation normal.  Cardiovascular:     Rate and Rhythm: Normal rate and regular rhythm.     Chest Wall: PMI is not displaced.     Pulses: Normal pulses.     Heart sounds: Normal heart sounds. No murmur. No friction rub. No gallop.   Pulmonary:     Effort: Pulmonary effort is normal. No respiratory distress.     Breath sounds: Normal breath sounds. No wheezing.  Abdominal:     General: Bowel sounds are normal. There is no distension or abdominal bruit.     Palpations: Abdomen is soft. There is no hepatomegaly or splenomegaly.     Tenderness: There is no abdominal tenderness. There is no right CVA tenderness or left CVA tenderness.     Hernia: No hernia is present.  Musculoskeletal:     Cervical back: Normal, normal range of motion and neck supple.     Thoracic back: Tenderness present. No swelling, edema, deformity, signs of trauma, lacerations, spasms or bony tenderness. Decreased range of motion. No scoliosis.     Lumbar back: Tenderness present. No swelling, edema, deformity, signs of trauma, lacerations, spasms or bony tenderness. Decreased range of motion. Negative right straight leg raise test. No scoliosis.  Right hip: Normal.     Left hip: Normal.     Right upper leg: Normal.     Left upper leg: Normal.     Right knee: Normal.     Left knee: Normal.     Right lower leg: No deformity,  lacerations, tenderness or bony tenderness. No edema.     Left lower leg: No edema.     Right ankle: Swelling present. No deformity, ecchymosis or lacerations. No tenderness. Normal range of motion. Normal pulse.     Right Achilles Tendon: Normal.     Left ankle: No swelling.     Comments: Walking boot on left lower extremity.  Lymphadenopathy:     Cervical: No cervical adenopathy.  Skin:    General: Skin is warm and dry.     Capillary Refill: Capillary refill takes less than 2 seconds.     Coloration: Skin is not cyanotic, jaundiced or pale.     Findings: No rash.  Neurological:     General: No focal deficit present.     Mental Status: She is alert and oriented to person, place, and time.     Cranial Nerves: Cranial nerves are intact. No cranial nerve deficit.     Sensory: Sensation is intact. No sensory deficit.     Motor: Motor function is intact. No weakness.     Coordination: Coordination is intact. Coordination normal.     Gait: Gait abnormal (in walking boot from recent surgery).     Deep Tendon Reflexes: Reflexes are normal and symmetric. Reflexes normal.  Psychiatric:        Attention and Perception: Attention and perception normal.        Mood and Affect: Mood and affect normal.        Speech: Speech normal.        Behavior: Behavior normal. Behavior is cooperative.        Thought Content: Thought content normal.        Cognition and Memory: Cognition and memory normal.        Judgment: Judgment normal.     Results for orders placed or performed in visit on 08/17/19  Bayer DCA Hb A1c Waived  Result Value Ref Range   HB A1C (BAYER DCA - WAIVED) 9.5 (H) <7.0 %       Pertinent labs & imaging results that were available during my care of the patient were reviewed by me and considered in my medical decision making.  Assessment & Plan:  Coco was seen today for medical management of chronic issues, diabetes and hyperlipidemia.  Diagnoses and all orders for this  visit:  Type 2 diabetes mellitus with hyperglycemia, with long-term current use of insulin ([facility]) A1C 9.5 today. Will increase Levemir to 30 units at bedtime. Importance of diet and exercise discussed in detail. Follow up in 3 months for reevaluation.  -     Bayer DCA Hb A1c Waived -     CBC with Differential/Platelet -     insulin detemir (LEVEMIR) 100 UNIT/ML injection; INJECT 0.3ML (30 UNITS TOTAL) SQ AT BEDTIME -     dapagliflozin propanediol (FARXIGA) 10 MG TABS tablet; Take 10 mg by mouth daily. -     liraglutide (VICTOZA) 18 MG/3ML SOPN; Inject 0.3 mLs (1.8 mg total) into the skin daily.  Hypertension associated with type 2 diabetes mellitus ([facility]) BP well controlled. Continue aggressive lifestyle changes to stay at goal. Goal BP is 130/80. Pt aware to report any persistent high or low  readings. DASH diet and exercise encouraged. Exercise at least 150 minutes per week and increase as tolerated. Goal BMI > 25. Stress management encouraged. Avoid nicotine and tobacco product use. Avoid excessive alcohol and NSAID's. Avoid more than 2000 mg of sodium daily. Medications as prescribed. Follow up as scheduled.  -     CBC with Differential/Platelet -     CMP14+EGFR  Hyperlipidemia associated with type 2 diabetes mellitus ([place]) Diet encouraged - increase intake of fresh fruits and vegetables, increase intake of lean proteins. Bake, broil, or grill foods. Avoid fried, greasy, and fatty foods. Avoid fast foods. Increase intake of fiber-rich whole grains. Exercise encouraged - at least 150 minutes per week and advance as tolerated.  Goal BMI < 25. Continue medications as prescribed. Follow up in 3-6 months as discussed.  -     CBC with Differential/Platelet -     Lipid panel -     pravastatin (PRAVACHOL) 80 MG tablet; TAKE ONE (1) TABLET EACH DAY  Chronic bilateral low back pain without sciatica Pain in both lower extremities Ongoing and worsening pain. Will draw ANA with reflex today for possible  autoimmune causes. Will increase gabapentin to 300 mg three times daily to see if beneficial. Pt aware to report any new or worsening symptoms.  -     ANA,IFA RA Diag Pnl w/rflx Tit/Patn -     gabapentin (NEURONTIN) 300 MG capsule; Take 1 capsule (300 mg total) by mouth 3 (three) times daily. Increase to 4x/day as needed  Morbid obesity ([place]) Diet and exercise discussed in detail.   Neuropathy due to type 2 diabetes mellitus ([facility]) Will increase gabapentin to 300 mg three times daily. Report any new or worsening symptoms.  -     gabapentin (NEURONTIN) 300 MG capsule; Take 1 capsule (300 mg total) by mouth 3 (three) times daily. Increase to 4x/day as needed  Gastroesophageal reflux disease without esophagitis No red flags present. Diet discussed. Avoid fried, spicy, fatty, greasy, and acidic foods. Avoid caffeine, nicotine, and alcohol. Do not eat 2-3 hours before bedtime and stay upright for at least 1-2 hours after eating. Eat small frequent meals. Avoid NSAID's like motrin and aleve. Medications as prescribed. Report any new or worsening symptoms. Follow up as discussed or sooner if needed.   -     famotidine (PEPCID) 20 MG tablet; Take 1 tablet (20 mg total) by mouth 2 (two) times daily.    Total time spent with patient 40 minutes.  Greater than 50% of encounter spent in coordination of care/counseling.  Continue all other maintenance medications.  Follow up plan: Return in about 3 months (around 11/15/2019), or if symptoms worsen or fail to improve, for DM.  Continue healthy lifestyle choices, including diet (rich in fruits, vegetables, and lean proteins, and low in salt and simple carbohydrates) and exercise (at least 30 minutes of moderate physical activity daily).  Educational handout given for DM  The above assessment and management plan was discussed with the patient. The patient verbalized understanding of and has agreed to the management plan. Patient is aware to call the clinic  if they develop any new symptoms or if symptoms persist or worsen. Patient is aware when to return to the clinic for a follow-up visit. Patient educated on when it is appropriate to go to the emergency department.   Monia Pouch, FNP-C [facility] [phone]

## 2019-08-17 NOTE — Patient Instructions (Signed)

## 2019-08-18 DIAGNOSIS — K219 Gastro-esophageal reflux disease without esophagitis: Secondary | ICD-10-CM | POA: Insufficient documentation

## 2019-08-18 LAB — CBC WITH DIFFERENTIAL/PLATELET
Basophils Absolute: 0.1 10*3/uL (ref 0.0–0.2)
Basos: 1 %
EOS (ABSOLUTE): 0.2 10*3/uL (ref 0.0–0.4)
Eos: 2 %
Hematocrit: 49.4 % — ABNORMAL HIGH (ref 34.0–46.6)
Hemoglobin: 16.2 g/dL — ABNORMAL HIGH (ref 11.1–15.9)
Immature Grans (Abs): 0 10*3/uL (ref 0.0–0.1)
Immature Granulocytes: 0 %
Lymphocytes Absolute: 2.8 10*3/uL (ref 0.7–3.1)
Lymphs: 34 %
MCH: 31.6 pg (ref 26.6–33.0)
MCHC: 32.8 g/dL (ref 31.5–35.7)
MCV: 97 fL (ref 79–97)
Monocytes Absolute: 0.6 10*3/uL (ref 0.1–0.9)
Monocytes: 8 %
Neutrophils Absolute: 4.5 10*3/uL (ref 1.4–7.0)
Neutrophils: 55 %
Platelets: 216 10*3/uL (ref 150–450)
RBC: 5.12 x10E6/uL (ref 3.77–5.28)
RDW: 12.3 % (ref 11.7–15.4)
WBC: 8.2 10*3/uL (ref 3.4–10.8)

## 2019-08-18 LAB — LIPID PANEL
Chol/HDL Ratio: 12.3 ratio — ABNORMAL HIGH (ref 0.0–4.4)
Cholesterol, Total: 209 mg/dL — ABNORMAL HIGH (ref 100–199)
HDL: 17 mg/dL — ABNORMAL LOW (ref >39)
LDL Chol Calc (NIH): 81 mg/dL (ref 0–99)
Triglycerides: 690 mg/dL (ref 0–149)
VLDL Cholesterol Cal: 111 mg/dL — ABNORMAL HIGH (ref 5–40)

## 2019-08-18 LAB — CMP14+EGFR
ALT: 17 IU/L (ref 0–32)
AST: 18 IU/L (ref 0–40)
Albumin/Globulin Ratio: 1.5 (ref 1.2–2.2)
Albumin: 3.7 g/dL — ABNORMAL LOW (ref 3.8–4.8)
Alkaline Phosphatase: 132 IU/L — ABNORMAL HIGH (ref 39–117)
BUN/Creatinine Ratio: 25 — ABNORMAL HIGH (ref 9–23)
BUN: 16 mg/dL (ref 6–24)
Bilirubin Total: <0.2 mg/dL (ref 0.0–1.2)
CO2: 25 mmol/L (ref 20–29)
Calcium: 9.6 mg/dL (ref 8.7–10.2)
Chloride: 99 mmol/L (ref 96–106)
Creatinine, Ser: 0.64 mg/dL (ref 0.57–1.00)
GFR calc Af Amer: 124 mL/min/{1.73_m2} (ref >59)
GFR calc non Af Amer: 107 mL/min/{1.73_m2} (ref >59)
Globulin, Total: 2.5 g/dL (ref 1.5–4.5)
Glucose: 248 mg/dL — ABNORMAL HIGH (ref 65–99)
Potassium: 3.9 mmol/L (ref 3.5–5.2)
Sodium: 138 mmol/L (ref 134–144)
Total Protein: 6.2 g/dL (ref 6.0–8.5)

## 2019-08-20 NOTE — Progress Notes (Signed)
Awaiting final ANA result Hemoglobin and hematocrit are slightly elevated, neck sure you are drinking enough water and avoiding cigarette smoke. Patient was elevated at 248, need to take medications as prescribed and limit excessive sugary foods and drinks and diet.  Alkaline phos was elevated at 132, we need to check this fasting at your next visit.  Renal function is normal. Triglycerides are critically high at 690.  You need to avoid fast foods, fried foods, fatty foods, and greasy foods.  We need to get this number down.  Want you to continue taking your pravastatin I want you to add over-the-counter omega-3 fish oils.  I do not want to add a fenofibrate at this time due to your already existing myalgias.  Need to recheck this in the next 2 to 3 months.  Diet and exercise along with your medications are essential to get these numbers where they need to be.

## 2019-08-21 ENCOUNTER — Encounter: Payer: Self-pay | Admitting: Family Medicine

## 2019-08-21 LAB — ANA,IFA RA DIAG PNL W/RFLX TIT/PATN
ANA Titer 1: NEGATIVE
Cyclic Citrullin Peptide Ab: 8 units (ref 0–19)
Rheumatoid fact SerPl-aCnc: <10 IU/mL (ref 0.0–13.9)

## 2019-08-21 NOTE — Progress Notes (Signed)
ANA negative

## 2019-08-22 ENCOUNTER — Other Ambulatory Visit: Payer: Self-pay | Admitting: Family Medicine

## 2019-08-22 DIAGNOSIS — M791 Myalgia, unspecified site: Secondary | ICD-10-CM

## 2019-08-22 DIAGNOSIS — M5412 Radiculopathy, cervical region: Secondary | ICD-10-CM

## 2019-08-22 DIAGNOSIS — M79604 Pain in right leg: Secondary | ICD-10-CM

## 2019-08-28 ENCOUNTER — Ambulatory Visit (INDEPENDENT_AMBULATORY_CARE_PROVIDER_SITE_OTHER): Payer: Medicaid Other | Admitting: Psychiatry

## 2019-08-28 ENCOUNTER — Other Ambulatory Visit: Payer: Self-pay

## 2019-08-28 DIAGNOSIS — F4310 Post-traumatic stress disorder, unspecified: Secondary | ICD-10-CM | POA: Diagnosis not present

## 2019-08-28 DIAGNOSIS — F331 Major depressive disorder, recurrent, moderate: Secondary | ICD-10-CM | POA: Diagnosis not present
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
## 2019-09-08 ENCOUNTER — Encounter: Payer: Self-pay | Admitting: Podiatry

## 2019-09-11 ENCOUNTER — Telehealth: Payer: Self-pay | Admitting: *Deleted

## 2019-09-11 NOTE — Telephone Encounter (Signed)
-----   Message ----- From: Baruch Gouty, FNP Sent: 09/10/2019   4:40 PM EST To: Zannie Cove, LPN   Will you see if she would like to see rheumatology. I think she has tried in the past and they would not see her.   Thanks,  Sharyn Lull  ----- Message ----- From: Durene Fruits Sent: 09/10/2019   3:32 PM EST To: Baruch Gouty, FNP  Hey, [facility] stated they do not treat for Fibromyalgia & that she would need to be sent to Rheumatology! Thanks Ma'am       I called and LM for pt to call me back to discuss this.4 JHB

## 2019-09-12 ENCOUNTER — Ambulatory Visit (INDEPENDENT_AMBULATORY_CARE_PROVIDER_SITE_OTHER): Payer: Medicaid Other | Admitting: Cardiology

## 2019-09-12 ENCOUNTER — Telehealth: Payer: Self-pay | Admitting: *Deleted

## 2019-09-12 ENCOUNTER — Ambulatory Visit (HOSPITAL_COMMUNITY)
Admission: RE | Admit: 2019-09-12 | Discharge: 2019-09-12 | Disposition: A | Discharge status: Home / Self Care | Payer: Medicaid Other | Source: Ambulatory Visit | Attending: Cardiology | Admitting: Cardiology

## 2019-09-12 ENCOUNTER — Encounter: Payer: Self-pay | Admitting: Cardiology

## 2019-09-12 ENCOUNTER — Ambulatory Visit (INDEPENDENT_AMBULATORY_CARE_PROVIDER_SITE_OTHER): Payer: Medicaid Other | Admitting: Podiatry

## 2019-09-12 ENCOUNTER — Other Ambulatory Visit: Payer: Self-pay

## 2019-09-12 VITALS — BP 140/78 | HR 92 | Temp 97.1°F | Ht 61.0 in | Wt 210.0 lb

## 2019-09-12 DIAGNOSIS — R609 Edema, unspecified: Secondary | ICD-10-CM

## 2019-09-12 DIAGNOSIS — Z9889 Other specified postprocedural states: Secondary | ICD-10-CM | POA: Diagnosis present

## 2019-09-12 DIAGNOSIS — M722 Plantar fascial fibromatosis: Secondary | ICD-10-CM | POA: Diagnosis not present

## 2019-09-12 DIAGNOSIS — I739 Peripheral vascular disease, unspecified: Secondary | ICD-10-CM | POA: Diagnosis not present

## 2019-09-12 DIAGNOSIS — R600 Localized edema: Secondary | ICD-10-CM

## 2019-09-12 DIAGNOSIS — E785 Hyperlipidemia, unspecified: Secondary | ICD-10-CM | POA: Diagnosis not present

## 2019-09-12 DIAGNOSIS — M7662 Achilles tendinitis, left leg: Secondary | ICD-10-CM

## 2019-09-12 DIAGNOSIS — M7661 Achilles tendinitis, right leg: Secondary | ICD-10-CM | POA: Diagnosis not present

## 2019-09-12 DIAGNOSIS — I82452 Acute embolism and thrombosis of left peroneal vein: Secondary | ICD-10-CM | POA: Diagnosis not present

## 2019-09-12 MED ORDER — APIXABAN 5 MG PO TABS: ORAL_TABLET | ORAL | 6 refills | Status: DC

## 2019-09-12 MED ORDER — OXYCODONE-ACETAMINOPHEN 5-325 MG PO TABS: 1.0000 | ORAL_TABLET | Freq: Four times a day (QID) | ORAL | 0 refills | Status: AC | PRN

## 2019-09-12 NOTE — Telephone Encounter (Signed)
[place] - MEDICAID AUTHORIZATION FOR VENOUS DOPPLER [id number] OF LEFT LEG, AUTHORIZATION:  [id number], EFFECTIVE:  09/12/2019, END:  03/10/2020. FAXED TO [place].

## 2019-09-12 NOTE — Progress Notes (Signed)
Please refer to [facility] asap. Thanks, Dr. Amalia Hailey

## 2019-09-12 NOTE — Progress Notes (Signed)
Cardiology Office Note:    Date:  09/13/2019   ID:  SEHER SHOVE, DOB [birth year], MRN [id number]  PCP:  Baruch Gouty, FNP  Cardiologist:  No primary care provider on file.  Electrophysiologist:  None   Referring MD: Baruch Gouty, FNP   Chief Complaint  Patient presents with  . Leg Pain    History of Present Illness:    Lisa Norton is a 49 y.o. female with a hx of type 2 diabetes, hyperlipidemia, hypertension, tobacco use who is seen for an acute visit after found to have DVT on imaging today.  She underwent a heel spur resection on 06/20/2019.  She also hit her foot on the back of a scooter on 07/2019.  She underwent lower extremity duplex today, which showed acute occlusive thrombus in one of left peroneal veins.  She does report that she has been having pain in her left foot.  Also incidentally noted to have left mid SFA occlusion with reconstitution of flow in the distal SFA.  Does report that she gets a burning sensation in both legs with exertion.  Occurs with walking up a flight of stairs.  She denies any bleeding issues.  No history of blood in her stool.  She continues to smoke, has smoked 1-1.5 ppd x 30 years.    Past Medical History:  Diagnosis Date  . Anxiety   . COLD SORE 05/15/2010  . DEPRESSION 12/12/2009  . Diabetes mellitus without complication ([place])   . High cholesterol   . 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 HYPERTENSION 12/12/2009  . TOBACCO ABUSE 12/12/2009    Past Surgical History:  Procedure Laterality Date  . BLADDER SUSPENSION    . CERVICAL FUSION    . CESAREAN SECTION WITH BILATERAL TUBAL LIGATION Bilateral 02/12/2015   Procedure: CESAREAN SECTION;  Surgeon: Florian Buff, MD;  Location: [facility];  Service: Obstetrics;  Laterality: Bilateral;  Fetal Demise  . CHOLECYSTECTOMY  2006  . EVALUATION UNDER ANESTHESIA WITH HEMORRHOIDECTOMY N/A 10/12/2018   Procedure: LATERAL INTERNAL HEMORRHOID LIGATION/PEXY ANORECTAL EXAM UNDER ANESTHESIA WITH HEMORRHOIDECTOMY X2;   Surgeon: Michael Boston, MD;  Location: [facility];  Service: General;  Laterality: N/A;  . FOOT SURGERY Right    Plantar, bone spurs  . FOOT SURGERY Bilateral 2019 and 2020   11/2018, 05/31/2019  . LAPAROSCOPIC VAGINAL HYSTERECTOMY WITH SALPINGECTOMY  02/16/2018   [facility]  . TONSILLECTOMY  1988    Current Medications: Current Meds  Medication Sig  . Accu-Chek FastClix Lancets MISC 4 TIMES DAILY  . ACCU-CHEK GUIDE test strip 4 TIMES DAILY  . cetirizine (ZYRTEC) 10 MG tablet Take 1 tablet (10 mg total) by mouth daily.  . clobetasol cream (TEMOVATE) 0.05 % APPLY AT BEDTIME  . clotrimazole (CLOTRIMAZOLE ATHLETES FOOT) 1 % cream Apply 1 application topically 2 (two) times daily.  . dapagliflozin propanediol (FARXIGA) 10 MG TABS tablet Take 10 mg by mouth daily.  Marland Kitchen EPINEPHrine (EPIPEN 2-PAK) 0.3 mg/0.3 mL IJ SOAJ injection Inject 0.3 mLs (0.3 mg total) into the muscle as needed for anaphylaxis.  . famotidine (PEPCID) 20 MG tablet Take 1 tablet (20 mg total) by mouth 2 (two) times daily.  Derrill Memo ON 09/17/2019] FLUoxetine (PROZAC) 40 MG capsule Take 1 capsule (40 mg total) by mouth daily.  . fluticasone (FLONASE) 50 MCG/ACT nasal spray Place 2 sprays into both nostrils daily.  Marland Kitchen gabapentin (NEURONTIN) 300 MG capsule Take 1 capsule (300 mg total) by mouth 3 (three) times daily.  Increase to 4x/day as needed  . hydrOXYzine (VISTARIL) 50 MG capsule TAKE 2 CAPSULES 3 TIMES DAILY AS NEEDED  . insulin detemir (LEVEMIR) 100 UNIT/ML injection INJECT 0.3ML (30 UNITS TOTAL) SQ AT BEDTIME  . levocetirizine (XYZAL) 5 MG tablet Take 1 tablet (5 mg total) by mouth every evening.  . liraglutide (VICTOZA) 18 MG/3ML SOPN Inject 0.3 mLs (1.8 mg total) into the skin daily.  . magnesium hydroxide (MILK OF MAGNESIA) 400 MG/5ML suspension Take 5-10 mLs by mouth daily as needed for mild constipation.  . mupirocin ointment (BACTROBAN) 2 % Apply 1 application topically 2 (two) times daily.  .  Oxycodone HCl 10 MG TABS Take 10 mg by mouth 4 (four) times daily as needed.  Marland Kitchen oxyCODONE-acetaminophen (PERCOCET) 5-325 MG tablet Take 1 tablet by mouth every 6 (six) hours as needed for severe pain.  . pravastatin (PRAVACHOL) 80 MG tablet TAKE ONE (1) TABLET EACH DAY  . ULTICARE MICRO PEN NEEDLES 32G X 4 MM MISC 3 TIMES DAILY     Allergies:   Metformin and related   Social History   Socioeconomic History  . Marital status: Legally Separated    Spouse name: Not on file  . Number of children: 2  . Years of education: Not on file  . Highest education level: Not on file  Occupational History  . Not on file  Tobacco Use  . Smoking status: Current Every Day Smoker    Packs/day: 1.50    Years: 20.00    Pack years: 30.00    Types: Cigarettes  . Smokeless tobacco: Former Systems developer    Types: Snuff    Quit date: 05/05/1986  Substance and Sexual Activity  . Alcohol use: Yes    Comment: had a bout of heavy drinking 2015, drinks occasionally now   . Drug use: Not Currently    Comment: past use of marijuana about every other day for two years, last used 25 years ago.   Marland Kitchen Sexual activity: Yes    Birth control/protection: None, Surgical    Comment: pt states she did not have a tubal  Other Topics Concern  . Not on file  Social History Narrative  . Not on file   Social Determinants of Health   Financial Resource Strain:   . Difficulty of Paying Living Expenses: Not on file  Food Insecurity:   . Worried About Charity fundraiser in the Last Year: Not on file  . Ran Out of Food in the Last Year: Not on file  Transportation Needs:   . Lack of Transportation (Medical): Not on file  . Lack of Transportation (Non-Medical): Not on file  Physical Activity:   . Days of Exercise per Week: Not on file  . Minutes of Exercise per Session: Not on file  Stress:   . Feeling of Stress : Not on file  Social Connections:   . Frequency of Communication with Friends and Family: Not on file  . Frequency  of Social Gatherings with Friends and Family: Not on file  . Attends Religious Services: Not on file  . Active Member of Clubs or Organizations: Not on file  . Attends Archivist Meetings: Not on file  . Marital Status: Not on file     Family History: The patient's family history includes Bipolar disorder in her sister; Heart disease in her mother; Narcolepsy in her mother. There is no history of Allergic rhinitis, Asthma, Eczema, or Urticaria.  ROS:   Please see the history  of present illness.     All other systems reviewed and are negative.  EKGs/Labs/Other Studies Reviewed:    The following studies were reviewed today:   EKG:  EKG is  ordered today.  The ekg ordered today demonstrates normal sinus rhythm, rate 92, motion artifact, no ST/T abnormalities  Recent Labs: 04/25/2019: TSH 3.170 08/17/2019: ALT 17; BUN 16; Creatinine, Ser 0.64; Hemoglobin 16.2; Platelets 216; Potassium 3.9; Sodium 138  Recent Lipid Panel    Component Value Date/Time   CHOL 209 (H) 08/17/2019 1518   TRIG 690 (HH) 08/17/2019 1518   HDL 17 (L) 08/17/2019 1518   CHOLHDL 12.3 (H) 08/17/2019 1518   CHOLHDL 11 04/25/2014 0847   VLDL 44.0 (H) 04/25/2014 0847   LDLCALC 81 08/17/2019 1518   LDLDIRECT 184.2 04/25/2014 0847    LE duplex 09/12/19: Right: No evidence of common femoral vein obstruction.  Left: Evidence of chronic venous insufficiency is detected in the deep venous system, the distal femoral vein and popliteal vein. Findings consistent with acute deep vein thrombosis involving the left peroneal veins. No cystic structure found in the  popliteal fossa.  Incidental findings: The left mid SFA appears occluded with reconstitution of flow in the distal SFA.     Physical Exam:    VS:  BP 140/78 (BP Location: Left Arm, Patient Position: Sitting, Cuff Size: Large)   Pulse 92   Temp (!) 97.1 F (36.2 C)   Ht 5\' 1"  (1.549 m)   Wt 210 lb (95.3 kg)   LMP 09/07/2017   BMI 39.68 kg/m      Wt Readings from Last 3 Encounters:  09/12/19 210 lb (95.3 kg)  08/17/19 215 lb (97.5 kg)  07/19/19 215 lb (97.5 kg)     GEN:  Well nourished, well developed in no acute distress HEENT: Normal NECK: No JVD; No carotid bruits LYMPHATICS: No lymphadenopathy CARDIAC: RRR, no murmurs, rubs, gallops RESPIRATORY:  Clear to auscultation without rales, wheezing or rhonchi  ABDOMEN: Soft, non-tender, non-distended MUSCULOSKELETAL:  No edema; left foot is warm.  SKIN: Warm and dry NEUROLOGIC:  Alert and oriented x 3 PSYCHIATRIC:  Normal affect   ASSESSMENT:    1. Acute deep vein thrombosis (DVT) of left peroneal vein (H[facility])   2. PAD (peripheral artery disease) ([place])   3. Hyperlipidemia, unspecified hyperlipidemia type    PLAN:    In order of problems listed above:  DVT: Venous duplex today shows acute deep vein thrombosis involving the left peroneal veins.  She does appear symptomatic.  Appears to be provoked given recent surgery.  Given symptomatic distal DVT, recommend treatment.  We will start Eliquis 10 mg twice daily x1 week then reduce to 5 mg twice daily to complete 40-month course.  PAD: On venous duplex today an incidental finding of left mid SFA occlusion was noted.  Will obtain dedicated arterial duplex and ABI for further evaluation.  She does report claudication.  Will plan to refer to Dr. Gwenlyn Found for further evaluation if imaging confirms occluded SFA  Hyperlipidemia: On pravastatin 80 mg daily  T2DM: A1c 9.5 on 12/18  RTC in 3 months  Medication Adjustments/Labs and Tests Ordered: Current medicines are reviewed at length with the patient today.  Concerns regarding medicines are outlined above.  Orders Placed This Encounter  Procedures  . EKG 12-Lead  . VAS [place] LOWER EXTREMITY ARTERIAL DUPLEX  . VAS [place] ABI WITH/WO TBI   Meds ordered this encounter  Medications  . apixaban (ELIQUIS) 5 MG TABS tablet  Sig: Take 2 tablets ( 10 mg ) twice a day for 7 days then take  1 tablet ( 5 mg ) twice a day    Dispense:  74 tablet    Refill:  6    Patient Instructions  Medication Instructions:  Start taking Eliquis 5 mg 2 tablets twice a day for 7 days then 1 tablet twice a day No aspirin or NSaids ( advil,ibuprofen,motrin )  May take Tylenol if needed *If you need a refill on your cardiac medications before your next appointment, please call your pharmacy*  Lab Work: None ordered  Testing/Procedures: Schedule Lower ext arterial dopplers Schedule ABI's  Follow-Up: At [facility], you and your health needs are our priority.  As part of our continuing mission to provide you with exceptional heart care, we have created designated Provider Care Teams.  These Care Teams include your primary Cardiologist (physician) and Advanced Practice Providers (APPs -  Physician Assistants and Nurse Practitioners) who all work together to provide you with the care you need, when you need it.  Your next appointment:  3 months  The format for your next appointment:  Office   Provider:  Dr.Bryannah Boston      Signed, Donato Heinz, MD  09/13/2019 12:00 PM    [place]

## 2019-09-12 NOTE — Telephone Encounter (Signed)
Faxed order to [facility].

## 2019-09-12 NOTE — Telephone Encounter (Signed)
-----   Message from Edrick Kins, DPM sent at 09/12/2019 12:10 PM EST ----- Regarding: venous doppler left Please order venous doppler Left to rule out DVT  Thanks, Dr. Amalia Hailey

## 2019-09-12 NOTE — Patient Instructions (Signed)
Medication Instructions:  Start taking Eliquis 5 mg 2 tablets twice a day for 7 days then 1 tablet twice a day No aspirin or NSaids ( advil,ibuprofen,motrin )  May take Tylenol if needed *If you need a refill on your cardiac medications before your next appointment, please call your pharmacy*  Lab Work: None ordered  Testing/Procedures: Schedule Lower ext arterial dopplers Schedule ABI's  Follow-Up: At [facility], you and your health needs are our priority.  As part of our continuing mission to provide you with exceptional heart care, we have created designated Provider Care Teams.  These Care Teams include your primary Cardiologist (physician) and Advanced Practice Providers (APPs -  Physician Assistants and Nurse Practitioners) who all work together to provide you with the care you need, when you need it.  Your next appointment:  3 months  The format for your next appointment:  Office   Provider:  Dr.Schumann

## 2019-09-13 ENCOUNTER — Telehealth: Payer: Self-pay | Admitting: *Deleted

## 2019-09-13 DIAGNOSIS — M7662 Achilles tendinitis, left leg: Secondary | ICD-10-CM

## 2019-09-13 DIAGNOSIS — R609 Edema, unspecified: Secondary | ICD-10-CM

## 2019-09-13 DIAGNOSIS — Z9889 Other specified postprocedural states: Secondary | ICD-10-CM

## 2019-09-13 NOTE — Telephone Encounter (Signed)
-----   Message from Edrick Kins, DPM sent at 09/12/2019  4:11 PM EST ----- Please refer to [facility] asap. Thanks, Dr. Amalia Hailey

## 2019-09-13 NOTE — Telephone Encounter (Signed)
-----   Message from Edrick Kins, DPM sent at 09/12/2019  3:23 PM EST ----- Regarding: refer to vein and vascular Patient is positive for DVT and femoral blockage. Please refer to vein and vascular asap. Thanks, Dr. Amalia Hailey

## 2019-09-13 NOTE — Telephone Encounter (Signed)
Referral made, information in a different Telephone Call from Staff Message order.

## 2019-09-13 NOTE — Telephone Encounter (Signed)
Faxed required form, referral, clinical of 08/01/2019, and demographics to [facility]. Faxed referral to [facility], [facility] does not accept pt's Medicaid.

## 2019-09-16 NOTE — Progress Notes (Signed)
   Subjective:  Patient presents today status post posterior and inferior heel spur resection right. DOS: 12/07/2018 and status post posterior and inferior heel spur resection left DOS: 05/31/2019. She reports continued pain in the bilateral heels. She reports associated swelling of the posterior left heel. She has been using a cane, walker and wheelchair to get around. She has been taking half a Percocet for treatment because she is running low. Patient is here for further evaluation and treatment.   Past Medical History:  Diagnosis Date  . Anxiety   . COLD SORE 05/15/2010  . DEPRESSION 12/12/2009  . Diabetes mellitus without complication ([place])   . High cholesterol   . 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 HYPERTENSION 12/12/2009  . TOBACCO ABUSE 12/12/2009      Objective/Physical Exam Neurovascular status intact.  Skin incisions appear to be well coapted. No sign of infectious process noted. No dehiscence. No active bleeding noted. Moderate edema noted to the surgical extremity. Tenderness to palpation of the posterior left calf.   Assessment: 1. s/p posterior and inferior heel spur resection left. DOS: 05/31/2019 2. s/p posterior and inferior heel spur resection right. DOS: 12/07/2018 3. Posterior calf pain left    Plan of Care:  1. Patient was evaluated.  2. Reordered physical therapy today. Patient has not started physical therapy yet.  3. Order for venous doppler to rule out DVT.  4. Refill prescription for Percocet 5/325 mg provided to patient.  5. Return to clinic in 6 weeks.    Edrick Kins, DPM [facility]  Dr. Edrick Kins, [address]                                        [place], [place] [address]                Office [phone]  Fax [phone]

## 2019-09-17 ENCOUNTER — Telehealth (HOSPITAL_COMMUNITY): Payer: Self-pay | Admitting: Psychiatry

## 2019-09-17 ENCOUNTER — Other Ambulatory Visit: Payer: Self-pay | Admitting: Allergy and Immunology

## 2019-09-17 ENCOUNTER — Ambulatory Visit (HOSPITAL_COMMUNITY): Payer: Medicaid Other | Admitting: Psychiatry

## 2019-09-17 ENCOUNTER — Other Ambulatory Visit: Payer: Self-pay

## 2019-09-17 NOTE — Telephone Encounter (Signed)
Therapist attempted to contact patient twice via text through doxy [email or web address] platform, no response.  Therapist called patient, left message indicating attempt, and requesting patient call office.

## 2019-09-18 ENCOUNTER — Ambulatory Visit (HOSPITAL_COMMUNITY): Payer: Medicaid Other | Admitting: Psychiatry

## 2019-09-19 ENCOUNTER — Ambulatory Visit (HOSPITAL_COMMUNITY)
Admission: RE | Admit: 2019-09-19 | Discharge: 2019-09-19 | Disposition: A | Discharge status: Home / Self Care | Payer: Medicaid Other | Source: Ambulatory Visit | Attending: Cardiology | Admitting: Cardiology

## 2019-09-19 ENCOUNTER — Other Ambulatory Visit: Payer: Self-pay | Admitting: Family Medicine

## 2019-09-19 ENCOUNTER — Telehealth (HOSPITAL_COMMUNITY): Payer: Self-pay

## 2019-09-19 ENCOUNTER — Other Ambulatory Visit: Payer: Self-pay

## 2019-09-19 ENCOUNTER — Encounter: Payer: Self-pay | Admitting: Family Medicine

## 2019-09-19 DIAGNOSIS — M79605 Pain in left leg: Secondary | ICD-10-CM

## 2019-09-19 DIAGNOSIS — M791 Myalgia, unspecified site: Secondary | ICD-10-CM

## 2019-09-19 DIAGNOSIS — I739 Peripheral vascular disease, unspecified: Secondary | ICD-10-CM

## 2019-09-19 DIAGNOSIS — M5412 Radiculopathy, cervical region: Secondary | ICD-10-CM

## 2019-09-19 DIAGNOSIS — E1140 Type 2 diabetes mellitus with diabetic neuropathy, unspecified: Secondary | ICD-10-CM

## 2019-09-19 DIAGNOSIS — M545 Low back pain, unspecified: Secondary | ICD-10-CM

## 2019-09-19 DIAGNOSIS — I82452 Acute embolism and thrombosis of left peroneal vein: Secondary | ICD-10-CM | POA: Diagnosis present

## 2019-09-19 DIAGNOSIS — G8929 Other chronic pain: Secondary | ICD-10-CM

## 2019-09-19 DIAGNOSIS — M79604 Pain in right leg: Secondary | ICD-10-CM

## 2019-09-19 NOTE — Telephone Encounter (Signed)

## 2019-09-19 NOTE — Telephone Encounter (Signed)
I discussed this with pt today through [employer].   Aware that Rheumatology was recommended and she is ok with that.  Please drop referral to [facility].

## 2019-09-19 NOTE — Telephone Encounter (Signed)
Referral placed.

## 2019-09-20 ENCOUNTER — Encounter: Payer: Self-pay | Admitting: Vascular Surgery

## 2019-09-20 ENCOUNTER — Encounter: Payer: Medicaid Other | Admitting: Vascular Surgery

## 2019-09-20 ENCOUNTER — Encounter: Payer: Self-pay | Admitting: Podiatry

## 2019-09-20 ENCOUNTER — Ambulatory Visit: Payer: Medicaid Other | Admitting: Vascular Surgery

## 2019-09-20 ENCOUNTER — Other Ambulatory Visit: Payer: Self-pay

## 2019-09-20 VITALS — BP 121/77 | HR 89 | Temp 97.7°F | Resp 16 | Ht 61.0 in | Wt 210.0 lb

## 2019-09-20 DIAGNOSIS — I739 Peripheral vascular disease, unspecified: Secondary | ICD-10-CM | POA: Diagnosis not present

## 2019-09-20 DIAGNOSIS — I824Z2 Acute embolism and thrombosis of unspecified deep veins of left distal lower extremity: Secondary | ICD-10-CM

## 2019-09-20 NOTE — Telephone Encounter (Signed)
Rheumatology ref sent.
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
## 2019-09-21 ENCOUNTER — Other Ambulatory Visit: Payer: Self-pay | Admitting: *Deleted

## 2019-09-21 ENCOUNTER — Ambulatory Visit (INDEPENDENT_AMBULATORY_CARE_PROVIDER_SITE_OTHER): Payer: Medicaid Other | Admitting: Psychiatry

## 2019-09-21 ENCOUNTER — Telehealth: Payer: Self-pay | Admitting: Family Medicine

## 2019-09-21 DIAGNOSIS — I739 Peripheral vascular disease, unspecified: Secondary | ICD-10-CM

## 2019-09-21 DIAGNOSIS — F4310 Post-traumatic stress disorder, unspecified: Secondary | ICD-10-CM

## 2019-09-21 DIAGNOSIS — F331 Major depressive disorder, recurrent, moderate: Secondary | ICD-10-CM | POA: Diagnosis not present
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
## 2019-09-21 NOTE — Telephone Encounter (Signed)
REFERRAL REQUEST Telephone Note  What type of referral do you need? Pain Management   Have you been seen at our office for this problem? Yes - Patient had Ref to Rheumatology - Ref was denied and Patient agreed to Pain Management Ref.  (Advise that they will likely need an appointment with their PCP before a referral can be done)  Is there a particular doctor or location that you prefer? [facility] [place]  Patient notified that referrals can take up to a week or longer to process. If they haven't heard anything within a week they should call back and speak with the referral department.

## 2019-09-24 ENCOUNTER — Other Ambulatory Visit: Payer: Self-pay | Admitting: Family Medicine

## 2019-09-24 DIAGNOSIS — M5412 Radiculopathy, cervical region: Secondary | ICD-10-CM

## 2019-09-24 DIAGNOSIS — M545 Low back pain, unspecified: Secondary | ICD-10-CM

## 2019-09-24 DIAGNOSIS — E1140 Type 2 diabetes mellitus with diabetic neuropathy, unspecified: Secondary | ICD-10-CM

## 2019-09-24 DIAGNOSIS — G8929 Other chronic pain: Secondary | ICD-10-CM

## 2019-09-24 DIAGNOSIS — M791 Myalgia, unspecified site: Secondary | ICD-10-CM | POA: Insufficient documentation

## 2019-09-24 NOTE — Telephone Encounter (Signed)
Referral placed, thanks

## 2019-09-25 ENCOUNTER — Telehealth: Payer: Self-pay | Admitting: *Deleted

## 2019-09-25 NOTE — Telephone Encounter (Signed)
Pt called left name and phone number. 

## 2019-09-25 NOTE — Telephone Encounter (Signed)
No. Discontinue physical therapy for the moment. - Dr. Amalia Hailey

## 2019-09-25 NOTE — Telephone Encounter (Signed)
I called pt and she states she saw Dr. Amalia Hailey 2 weeks ago and that she has a blood clot and blocked artery, but she is not sure what is going on, Dr. Scot Dock said to come back in 9 months. I told pt I would inform Dr. Amalia Hailey and call with any information he could give me. I told pt I message that she should not perform PT.

## 2019-10-01 ENCOUNTER — Other Ambulatory Visit: Payer: Self-pay

## 2019-10-01 ENCOUNTER — Ambulatory Visit (HOSPITAL_COMMUNITY): Payer: Medicaid Other | Admitting: Psychiatry

## 2019-10-02 ENCOUNTER — Ambulatory Visit (INDEPENDENT_AMBULATORY_CARE_PROVIDER_SITE_OTHER): Payer: Medicaid Other | Admitting: Psychiatry

## 2019-10-02 ENCOUNTER — Other Ambulatory Visit: Payer: Self-pay

## 2019-10-02 DIAGNOSIS — F331 Major depressive disorder, recurrent, moderate: Secondary | ICD-10-CM

## 2019-10-02 DIAGNOSIS — F4310 Post-traumatic stress disorder, unspecified: Secondary | ICD-10-CM
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
## 2019-10-05 DIAGNOSIS — Z8742 Personal history of other diseases of the female genital tract: Secondary | ICD-10-CM | POA: Insufficient documentation

## 2019-10-08 ENCOUNTER — Ambulatory Visit (INDEPENDENT_AMBULATORY_CARE_PROVIDER_SITE_OTHER): Payer: Medicaid Other | Admitting: Psychiatry

## 2019-10-08 ENCOUNTER — Other Ambulatory Visit: Payer: Self-pay

## 2019-10-08 ENCOUNTER — Encounter: Payer: Self-pay | Admitting: Psychiatry

## 2019-10-08 ENCOUNTER — Telehealth: Payer: Self-pay

## 2019-10-08 DIAGNOSIS — F331 Major depressive disorder, recurrent, moderate: Secondary | ICD-10-CM | POA: Diagnosis not present

## 2019-10-08 DIAGNOSIS — F4310 Post-traumatic stress disorder, unspecified: Secondary | ICD-10-CM

## 2019-10-08 MED ORDER — FLUOXETINE HCL 40 MG PO CAPS: 40.0000 mg | ORAL_CAPSULE | Freq: Every day | ORAL | 0 refills | Status: DC

## 2019-10-08 MED ORDER — ARIPIPRAZOLE 2 MG PO TABS: 2.0000 mg | ORAL_TABLET | Freq: Every day | ORAL | 0 refills | Status: DC

## 2019-10-08 NOTE — Progress Notes (Signed)
Nina MD OP Progress Note  I connected with  Lisa Norton on 10/08/19 by a video enabled telemedicine application and verified that I am speaking with the correct person using two identifiers.   I discussed the limitations of evaluation and management by telemedicine. The patient expressed understanding and agreed to proceed.    10/08/2019 2:51 PM Lisa Norton  MRN:  [id number]  Chief Complaint: " I am the same."  HPI: Patient stated that she continues to be depressed and has hard time moving on.  She gives "her past and keeps beating herself for the things that happened. She described feeling like being stuck in a bag and not being to able to get out. She has been diagnosed with venous thrombosis in lower extremities and is stressed about her health.  She informed that she rarely gets out of her house. She is isolative and the only thing that gives her joy is taking care of her 3 'fur babies'. She resides with her boyfriend. She stated that she has auditory hallucinations. She feels paranoid about being followed and watched by unknown people. She hates it when some one stands behind her when she goes to the store. She stated that she does not have any active suicidal ideations however thoughts of her being better off dead have crossed her mind multiple times.  She stated that she would not hurt herself as she wants to see her deceased daughter and having after her demise. She stated that she is scared to share her any feelings with anyone as she is concerned that they may call 911 and get her locked away. She has taken Wellbutrin in the past but did not like the way it made her feel. She has not taken an antipsychotic as per EMR. She was willing to try Abilify to augment Prozac and also to help her with the hallucinations and paranoid ideations.  Visit Diagnosis:    ICD-10-CM   1. MDD (major depressive disorder), recurrent episode, moderate (HCC)  F33.1   2. PTSD (post-traumatic stress  disorder)  F43.10     Past Psychiatric History: MDD, PTSD  Past Medical History:  Past Medical History:  Diagnosis Date  . Anxiety   . COLD SORE 05/15/2010  . DEPRESSION 12/12/2009  . Diabetes mellitus without complication ([place])   . High cholesterol   . 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 HYPERTENSION 12/12/2009  . TOBACCO ABUSE 12/12/2009    Past Surgical History:  Procedure Laterality Date  . BLADDER SUSPENSION    . CERVICAL FUSION    . CESAREAN SECTION WITH BILATERAL TUBAL LIGATION Bilateral 02/12/2015   Procedure: CESAREAN SECTION;  Surgeon: Florian Buff, MD;  Location: [facility];  Service: Obstetrics;  Laterality: Bilateral;  Fetal Demise  . CHOLECYSTECTOMY  2006  . EVALUATION UNDER ANESTHESIA WITH HEMORRHOIDECTOMY N/A 10/12/2018   Procedure: LATERAL INTERNAL HEMORRHOID LIGATION/PEXY ANORECTAL EXAM UNDER ANESTHESIA WITH HEMORRHOIDECTOMY X2;  Surgeon: Michael Boston, MD;  Location: [facility];  Service: General;  Laterality: N/A;  . FOOT SURGERY Right    Pl[place], bone spurs  . FOOT SURGERY Bilateral 2019 and 2020   11/2018, 05/31/2019  . LAPAROSCOPIC VAGINAL HYSTERECTOMY WITH SALPINGECTOMY  02/16/2018   [facility]  . TONSILLECTOMY  1988    Family Psychiatric History: see below  Family History:  Family History  Problem Relation Age of Onset  . Heart disease Mother   . Narcolepsy Mother   . Bipolar disorder Sister   . Allergic rhinitis Neg  Hx   . Asthma Neg Hx   . Eczema Neg Hx   . Urticaria Neg Hx     Social History:  Social History   Socioeconomic History  . Marital status: Legally Separated    Spouse name: Not on file  . Number of children: 2  . Years of education: Not on file  . Highest education level: Not on file  Occupational History  . Not on file  Tobacco Use  . Smoking status: Current Every Day Smoker    Packs/day: 1.50    Years: 20.00    Pack years: 30.00    Types: Cigarettes  . Smokeless tobacco: Former Systems developer    Types: Snuff    Quit  date: 05/05/1986  Substance and Sexual Activity  . Alcohol use: Yes    Comment: had a bout of heavy drinking 2015, drinks occasionally now   . Drug use: Not Currently    Comment: past use of marijuana about every other day for two years, last used 25 years ago.   Marland Kitchen Sexual activity: Yes    Birth control/protection: None, Surgical    Comment: pt states she did not have a tubal  Other Topics Concern  . Not on file  Social History Narrative  . Not on file   Social Determinants of Health   Financial Resource Strain:   . Difficulty of Paying Living Expenses: Not on file  Food Insecurity:   . Worried About Charity fundraiser in the Last Year: Not on file  . Ran Out of Food in the Last Year: Not on file  Transportation Needs:   . Lack of Transportation (Medical): Not on file  . Lack of Transportation (Non-Medical): Not on file  Physical Activity:   . Days of Exercise per Week: Not on file  . Minutes of Exercise per Session: Not on file  Stress:   . Feeling of Stress : Not on file  Social Connections:   . Frequency of Communication with Friends and Family: Not on file  . Frequency of Social Gatherings with Friends and Family: Not on file  . Attends Religious Services: Not on file  . Active Member of Clubs or Organizations: Not on file  . Attends Archivist Meetings: Not on file  . Marital Status: Not on file    Allergies:  Allergies  Allergen Reactions  . Metformin And Related Diarrhea    Metabolic Disorder Labs: Lab Results  Component Value Date   HGBA1C 9.5 (H) 08/17/2019   MPG 117 (H) 08/12/2014   No results found for: PROLACTIN Lab Results  Component Value Date   CHOL 209 (H) 08/17/2019   TRIG 690 (HH) 08/17/2019   HDL 17 (L) 08/17/2019   CHOLHDL 12.3 (H) 08/17/2019   VLDL 44.0 (H) 04/25/2014   LDLCALC 81 08/17/2019   [place] Comment 04/25/2019   Lab Results  Component Value Date   TSH 3.170 04/25/2019   TSH 2.580 01/19/2019    Therapeutic Level  Labs: No results found for: LITHIUM No results found for: VALPROATE No components found for:  CBMZ  Current Medications: Current Outpatient Medications  Medication Sig Dispense Refill  . Accu-Chek FastClix Lancets MISC 4 TIMES DAILY 102 each 5  . ACCU-CHEK GUIDE test strip 4 TIMES DAILY 100 each 5  . apixaban (ELIQUIS) 5 MG TABS tablet Take 2 tablets ( 10 mg ) twice a day for 7 days then take 1 tablet ( 5 mg ) twice a day 74 tablet 6  .  cetirizine (ZYRTEC) 10 MG tablet Take 1 tablet (10 mg total) by mouth daily. 30 tablet 11  . clobetasol cream (TEMOVATE) 0.05 % APPLY AT BEDTIME 60 g 1  . clotrimazole (CLOTRIMAZOLE ATHLETES FOOT) 1 % cream Apply 1 application topically 2 (two) times daily. 30 g 0  . dapagliflozin propanediol (FARXIGA) 10 MG TABS tablet Take 10 mg by mouth daily. 30 tablet 4  . EPINEPHrine (EPIPEN 2-PAK) 0.3 mg/0.3 mL IJ SOAJ injection Inject 0.3 mLs (0.3 mg total) into the muscle as needed for anaphylaxis. 2 Device 2  . famotidine (PEPCID) 20 MG tablet Take 1 tablet (20 mg total) by mouth 2 (two) times daily. 60 tablet 5  . FLUoxetine (PROZAC) 40 MG capsule Take 1 capsule (40 mg total) by mouth daily. 90 capsule 0  . fluticasone (FLONASE) 50 MCG/ACT nasal spray Place 2 sprays into both nostrils daily. 16 g 5  . gabapentin (NEURONTIN) 300 MG capsule Take 1 capsule (300 mg total) by mouth 3 (three) times daily. Increase to 4x/day as needed 90 capsule 5  . hydrOXYzine (VISTARIL) 50 MG capsule TAKE 2 CAPSULES 3 TIMES DAILY AS NEEDED 60 capsule 1  . insulin detemir (LEVEMIR) 100 UNIT/ML injection INJECT 0.3ML (30 UNITS TOTAL) SQ AT BEDTIME 10 mL 4  . levocetirizine (XYZAL) 5 MG tablet Take 1 tablet (5 mg total) by mouth every evening. 60 tablet 5  . liraglutide (VICTOZA) 18 MG/3ML SOPN Inject 0.3 mLs (1.8 mg total) into the skin daily. 9 mL 6  . magnesium hydroxide (MILK OF MAGNESIA) 400 MG/5ML suspension Take 5-10 mLs by mouth daily as needed for mild constipation.    .  mupirocin ointment (BACTROBAN) 2 % Apply 1 application topically 2 (two) times daily. 22 g 0  . Oxycodone HCl 10 MG TABS Take 10 mg by mouth 4 (four) times daily as needed.    Marland Kitchen oxyCODONE-acetaminophen (PERCOCET) 5-325 MG tablet Take 1 tablet by mouth every 6 (six) hours as needed for severe pain. 30 tablet 0  . pravastatin (PRAVACHOL) 80 MG tablet TAKE ONE (1) TABLET EACH DAY 30 tablet 5  . ULTICARE MICRO PEN NEEDLES 32G X 4 MM MISC 3 TIMES DAILY 100 each 11   No current facility-administered medications for this visit.     Musculoskeletal: Strength & Muscle Tone: unable to assess due to telemed visit Gait & Station: unable to assess due to telemed visit Patient leans: unable to assess due to telemed visit   Psychiatric Specialty Exam: Review of Systems  There were no vitals taken for this visit.There is no height or weight on file to calculate BMI.  General Appearance: Fairly Groomed  Eye Contact:  Good  Speech:  Clear and Coherent and Normal Rate  Volume:  Normal  Mood:  Depressed  Affect:  Sad, tearful  Thought Process:  Goal Directed, Linear and Descriptions of Associations: Intact  Orientation:  Full (Time, Place, and Person)  Thought Content: Logical   Suicidal Thoughts:  No  Homicidal Thoughts:  No  Memory:  Recent;   Good Remote;   Good  Judgement:  Fair  Insight:  Fair  Psychomotor Activity:  Normal  Concentration:  Concentration: Good and Attention Span: Good  Recall:  Good  Fund of Knowledge: Good  Language: Good  Akathisia:  Negative  Handed:  Right  AIMS (if indicated): not done  Assets:  Communication Skills Desire for Improvement Financial Resources/Insurance Housing  ADL's:  Intact  Cognition: WNL  Sleep:  Fair  Screenings: GAD-7     Office Visit from 09/15/2017 in [place] for [facility]  Total GAD-7 Score  3    PHQ2-9     Office Visit from 08/17/2019 in [place] Visit from 07/19/2019 in  [place] Visit from 05/09/2019 in [place] Office Visit from 04/25/2019 in [place] Visit from 01/19/2019 in [place]  PHQ-2 Total Score  1  1  0  3  0  PHQ-9 Total Score  -  -  -  13  0       Assessment and Plan: 49 year old female with history of MDD, PTSD, diabetes mellitus, hypertension, hyperlipidemia, chronic bilateral back pain, PAD now seen for follow-up.  She reports ongoing depressive symptoms with auditory hallucinations and paranoid ideations.  She is agreeable to adding Abilify to her regimen to help her with mood and also the psychotic symptoms. Potential side effects of medication and risks vs benefits of treatment vs non-treatment were explained and discussed. All questions were answered.   1. MDD (major depressive disorder), recurrent episode, moderate (HCC)  - Continue FLUoxetine (PROZAC) 40 MG capsule; Take 1 capsule (40 mg total) by mouth daily.  Dispense: 90 capsule; Refill: 0 - Start ARIPiprazole (ABILIFY) 2 MG tablet; Take 1 tablet (2 mg total) by mouth daily.  Dispense: 30 tablet; Refill: 0  2. PTSD (post-traumatic stress disorder)  - FLUoxetine (PROZAC) 40 MG capsule; Take 1 capsule (40 mg total) by mouth daily.  Dispense: 90 capsule; Refill: 0  F/up in 6 weeks with Dr. Modesta Messing. Continue individual therapy with ms. Peggy.  [place] Crane, MD 10/08/2019, 2:51 PM

## 2019-10-08 NOTE — Telephone Encounter (Signed)
Medication management - Message left for pt after calling [facility] that her Abilify 2 mg tablets were approved and should be able to be filled at her pharmacy now. [id number] and good through 10/02/2020. Interaction # [id number].

## 2019-10-10 ENCOUNTER — Other Ambulatory Visit (INDEPENDENT_AMBULATORY_CARE_PROVIDER_SITE_OTHER): Payer: Medicaid Other

## 2019-10-10 ENCOUNTER — Encounter: Payer: Self-pay | Admitting: Physician Assistant

## 2019-10-10 ENCOUNTER — Ambulatory Visit: Payer: Medicaid Other | Admitting: Physician Assistant

## 2019-10-10 ENCOUNTER — Telehealth: Payer: Self-pay | Admitting: *Deleted

## 2019-10-10 VITALS — BP 100/70 | HR 80 | Temp 97.6°F | Ht 61.0 in | Wt 218.0 lb

## 2019-10-10 DIAGNOSIS — K625 Hemorrhage of anus and rectum: Secondary | ICD-10-CM | POA: Diagnosis not present

## 2019-10-10 DIAGNOSIS — Z8719 Personal history of other diseases of the digestive system: Secondary | ICD-10-CM | POA: Diagnosis not present

## 2019-10-10 LAB — CBC WITH DIFFERENTIAL/PLATELET
Basophils Absolute: 0 10*3/uL (ref 0.0–0.1)
Basophils Relative: 0.5 % (ref 0.0–3.0)
Eosinophils Absolute: 0.2 10*3/uL (ref 0.0–0.7)
Eosinophils Relative: 2.2 % (ref 0.0–5.0)
HCT: 47.9 % — ABNORMAL HIGH (ref 36.0–46.0)
Hemoglobin: 15.9 g/dL — ABNORMAL HIGH (ref 12.0–15.0)
Lymphocytes Relative: 31.4 % (ref 12.0–46.0)
Lymphs Abs: 2.3 10*3/uL (ref 0.7–4.0)
MCHC: 33.3 g/dL (ref 30.0–36.0)
MCV: 97.6 fl (ref 78.0–100.0)
Monocytes Absolute: 0.5 10*3/uL (ref 0.1–1.0)
Monocytes Relative: 6.5 % (ref 3.0–12.0)
Neutro Abs: 4.3 10*3/uL (ref 1.4–7.7)
Neutrophils Relative %: 59.4 % (ref 43.0–77.0)
Platelets: 176 10*3/uL (ref 150.0–400.0)
RBC: 4.91 Mil/uL (ref 3.87–5.11)
RDW: 13.4 % (ref 11.5–15.5)
WBC: 7.2 10*3/uL (ref 4.0–10.5)

## 2019-10-10 NOTE — Patient Instructions (Signed)
If you are age 47 or older, your body mass index should be between 23-30. Your Body mass index is 41.19 kg/m. If this is out of the aforementioned range listed, please consider follow up with your Primary Care Provider.  If you are age 29 or younger, your body mass index should be between 19-25. Your Body mass index is 41.19 kg/m. If this is out of the aformentioned range listed, please consider follow up with your Primary Care Provider.   Your provider has requested that you go to the basement level for lab work before leaving today. Press "B" on the elevator. The lab is located at the first door on the left as you exit the elevator.  We have sent the following medications to your pharmacy for you to pick up at your convenience: Hydrocortisone suppositories nightly for 14 days then as needed.  Start Colace stool softener nightly.  Start Miralax 17 gm - 1/2 dose daily.

## 2019-10-10 NOTE — Telephone Encounter (Signed)
Pharmacy, please give your recommendations for holding Eliquis for upcoming colonoscopy.   I suspect that procedure is going to need to be postponed due to recent DVT but please give your recommendations.  Thanks so much!

## 2019-10-10 NOTE — Progress Notes (Signed)
Subjective:    Patient ID: Lisa Norton, female    DOB: 1970-10-25, 49 y.o.   MRN: [id number]  HPI Lisa "Ambers Iyengar" is a pleasant 49 year old white female, established with Dr. Havery Moros.  She comes in today with recurrent rectal bleeding. She has history of hypertension, GERD, adult onset diabetes mellitus, PTSD, depression and history of rectocele. She had undergone surgery per Dr. Johney Maine in February 2020 for refractory anal fissure and grade 3 and 4 internal/external hemorrhoids.  She had a lateral internal sphincterotomy, hemorrhoidectomy x2 and hemorrhoid ligation/pexy. Patient says she did well for multiple months after surgery and started seeing bright red blood intermittently again a couple of months ago.  Bleeding seems to be exacerbated by constipation and/or hard stools though she says it is hard to control her bowels because she will alternate back and forth between constipation and diarrhea.  She is currently seeing blood in the commode and on the tissue fairly frequently though not daily.  Not having any current rectal pain. She had been back to Dr. Johney Maine recently for follow-up on 10/01/2019 and on exam found only to have a few small anal tag no recurrent fissure and grade 1-2 internal hemorrhoids.  He recommended colonoscopy. Unfortunately she has also been diagnosed with a very recent DVT which is felt to be provoked after an ankle surgery.  She is now on Eliquis.  She was also found without evaluation to have peripheral arterial disease and will be following up with vascular surgery.  Dr. Shuman/cardiology is managing Eliquis and per notes plans a 49-month course.  Review of Systems Pertinent positive and negative review of systems were noted in the above HPI section.  All other review of systems was otherwise negative.  Outpatient Encounter Medications as of 10/10/2019  Medication Sig  . Accu-Chek FastClix Lancets MISC 4 TIMES DAILY  . ACCU-CHEK GUIDE test strip 4 TIMES DAILY  .  apixaban (ELIQUIS) 5 MG TABS tablet Take 2 tablets ( 10 mg ) twice a day for 7 days then take 1 tablet ( 5 mg ) twice a day  . ARIPiprazole (ABILIFY) 2 MG tablet Take 1 tablet (2 mg total) by mouth daily.  . cetirizine (ZYRTEC) 10 MG tablet Take 1 tablet (10 mg total) by mouth daily.  . clobetasol cream (TEMOVATE) 0.05 % APPLY AT BEDTIME  . clotrimazole (CLOTRIMAZOLE ATHLETES FOOT) 1 % cream Apply 1 application topically 2 (two) times daily.  . dapagliflozin propanediol (FARXIGA) 10 MG TABS tablet Take 10 mg by mouth daily.  Marland Kitchen EPINEPHrine (EPIPEN 2-PAK) 0.3 mg/0.3 mL IJ SOAJ injection Inject 0.3 mLs (0.3 mg total) into the muscle as needed for anaphylaxis.  . famotidine (PEPCID) 20 MG tablet Take 1 tablet (20 mg total) by mouth 2 (two) times daily.  Marland Kitchen FLUoxetine (PROZAC) 40 MG capsule Take 1 capsule (40 mg total) by mouth daily.  . fluticasone (FLONASE) 50 MCG/ACT nasal spray Place 2 sprays into both nostrils daily.  Marland Kitchen gabapentin (NEURONTIN) 300 MG capsule Take 1 capsule (300 mg total) by mouth 3 (three) times daily. Increase to 4x/day as needed  . hydrOXYzine (VISTARIL) 50 MG capsule TAKE 2 CAPSULES 3 TIMES DAILY AS NEEDED  . insulin detemir (LEVEMIR) 100 UNIT/ML injection INJECT 0.3ML (30 UNITS TOTAL) SQ AT BEDTIME  . levocetirizine (XYZAL) 5 MG tablet Take 1 tablet (5 mg total) by mouth every evening.  . liraglutide (VICTOZA) 18 MG/3ML SOPN Inject 0.3 mLs (1.8 mg total) into the skin daily.  . magnesium hydroxide (MILK  OF MAGNESIA) 400 MG/5ML suspension Take 5-10 mLs by mouth daily as needed for mild constipation.  . mupirocin ointment (BACTROBAN) 2 % Apply 1 application topically 2 (two) times daily.  . Oxycodone HCl 10 MG TABS Take 10 mg by mouth 4 (four) times daily as needed.  Marland Kitchen oxyCODONE-acetaminophen (PERCOCET) 5-325 MG tablet Take 1 tablet by mouth every 6 (six) hours as needed for severe pain.  . pravastatin (PRAVACHOL) 80 MG tablet TAKE ONE (1) TABLET EACH DAY  . ULTICARE MICRO PEN  NEEDLES 32G X 4 MM MISC 3 TIMES DAILY   No facility-administered encounter medications on file as of 10/10/2019.   Allergies  Allergen Reactions  . Metformin And Related Diarrhea   Patient Active Problem List   Diagnosis Date Noted  . Myalgia 09/24/2019  . Gastroesophageal reflux disease without esophagitis 08/18/2019  . Neuropathy due to type 2 diabetes mellitus ([place]) 08/17/2019  . Pain in both lower extremities 08/17/2019  . Low back pain 05/17/2019  . Elevated alkaline phosphatase level 04/26/2019  . Xerostomia 03/20/2019  . PTSD (post-traumatic stress disorder) 02/07/2019  . MDD (major depressive disorder), recurrent episode, moderate ([place]) 02/07/2019  . Allergy to alpha-gal 01/23/2019  . Seasonal and perennial allergic rhinitis 01/02/2019  . Recurrent urticaria 12/04/2018  . 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 Prolapsed internal hemorrhoids, grade 2-4 s/p hemorrhoidal ligation/pexy/resection 10/12/2018 10/12/2018  . Trochanteric bursitis, left hip 09/14/2018  . Rectocele 01/05/2018  . Uterovaginal prolapse 01/05/2018  . Urinary incontinence, mixed 12/16/2017  . S/P cesarean section 02/12/2015  . Fetal demise, greater than 22 weeks, antepartum   . 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 Morbid obesity ([place]) 08/13/2014  . C7 radiculopathy 02/11/2014  . Hyperlipidemia associated with type 2 diabetes mellitus ([place]) 01/04/2011  . Type 2 diabetes mellitus ([place]) 01/04/2011  . TOBACCO ABUSE 12/12/2009  . Hypertension associated with type 2 diabetes mellitus ([place]) 12/12/2009   Social History   Socioeconomic History  . Marital status: Legally Separated    Spouse name: Not on file  . Number of children: 2  . Years of education: Not on file  . Highest education level: Not on file  Occupational History  . Not on file  Tobacco Use  . Smoking status: Current Every Day Smoker    Packs/day: 1.50    Years: 20.00    Pack  years: 30.00    Types: Cigarettes  . Smokeless tobacco: Former Systems developer    Types: Snuff    Quit date: 05/05/1986  Substance and Sexual Activity  . Alcohol use: Yes    Comment: had a bout of heavy drinking 2015, drinks occasionally now   . Drug use: Not Currently    Comment: past use of marijuana about every other day for two years, last used 25 years ago.   Marland Kitchen Sexual activity: Yes    Birth control/protection: None, Surgical    Comment: pt states she did not have a tubal  Other Topics Concern  . Not on file  Social History Narrative  . Not on file   Social Determinants of Health   Financial Resource Strain:   . Difficulty of Paying Living Expenses: Not on file  Food Insecurity:   . Worried About Charity fundraiser in the Last Year: Not on file  . Ran Out of Food in the Last Year: Not on file  Transportation Needs:   . Lack of Transportation (Medical): Not on file  . Lack of Transportation (  Non-Medical): Not on file  Physical Activity:   . Days of Exercise per Week: Not on file  . Minutes of Exercise per Session: Not on file  Stress:   . Feeling of Stress : Not on file  Social Connections:   . Frequency of Communication with Friends and Family: Not on file  . Frequency of Social Gatherings with Friends and Family: Not on file  . Attends Religious Services: Not on file  . Active Member of Clubs or Organizations: Not on file  . Attends Archivist Meetings: Not on file  . Marital Status: Not on file  Intimate Partner Violence:   . Fear of Current or Ex-Partner: Not on file  . Emotionally Abused: Not on file  . Physically Abused: Not on file  . Sexually Abused: Not on file    Lisa Norton's family history includes Bipolar disorder in her sister; Heart disease in her mother; Narcolepsy in her mother.      Objective:    Vitals:   10/10/19 0928  BP: 100/70  Pulse: 80  Temp: 97.6 F (36.4 C)    Physical Exam Well-developed well-nourished female in no acute  distress.  Height, Weight,218 BMI 41.1  HEENT; nontraumatic normocephalic, EOMI, PER R LA, sclera anicteric. Oropharynx; not examined Neck; supple, no JVD Cardiovascular; regular rate and rhythm with S1-S2, no murmur rub or gallop Pulmonary; Clear bilaterally Abdomen; soft, obese, nontender, nondistended, no palpable mass or hepatosplenomegaly, bowel sounds are active Rectal; not done today Skin; benign exam, no jaundice rash or appreciable lesions Extremities; no clubbing cyanosis or edema skin warm and dry Neuro/Psych; alert and oriented x4, grossly nonfocal mood and affect appropriate       Assessment & Plan:   #77 49 year old white female with recurrent hematochezia x2 months, increased over the past few weeks since starting Eliquis. Patient had undergone internal sphincterotomy for a persistent anal fissure and hemorrhoidectomy for grade 3 and 4 internal and external hemorrhoids in February 2020. Evaluation per Dr. Johney Maine last week with no finding of recurrent fissure and small grade 1-2 internal hemorrhoids.  Etiology of bleeding may be secondary to internal hemorrhoids though cannot rule out other colonic lesion.  #2 new DVT diagnosed January 2021-on Eliquis #3 new diagnosis of peripheral arterial disease #4 GERD stable 5.  Adult onset diabetes mellitus  Plan; patient will need colonoscopy however given very recent DVT do not want to interrupt Eliquis at present. We will check CBC today Start hydrocortisone suppositories nightly x2 weeks then as needed for any recurrent bleeding. Start Colace daily. We will plan to see her back in the office in 3 to 4 weeks and reevaluate, assure hemoglobin stable, and then plan for colonoscopy with Dr. Havery Moros in April 2021 which will be 3 months post DVT.  At that time should be safe to interrupt anticoagulation for procedure. Patient knows to call should she develop any increase in rectal bleeding over the next couple of weeks.  Lisa Norton Genia Harold PA-C 10/10/2019   Cc: Baruch Gouty, FNP

## 2019-10-10 NOTE — Telephone Encounter (Signed)
Patient with diagnosis of DVT (provoked) on Eliquis for anticoagulation.    Procedure: colonoscopy Date of procedure: 11/2019  CrCl 169.7 Platelet count 176  Patient with provoked DVT on 09/12/2019.  She will need to complete a 3 month course of Eliquis. She is scheduled to see Dr. Gardiner Rhyme on April 14.  Once he confirms that she need not continue anticoagulation, she can have colonoscopy.

## 2019-10-10 NOTE — Telephone Encounter (Signed)
[facility] Pre-operative Risk Assessment     Request for surgical clearance:     Endoscopy Procedure  What type of surgery is being performed?     Colonoscopy  When is this surgery scheduled?     11/2019  What type of clearance is required ?   Pharmacy  Are there any medications that need to be held prior to surgery and how long? Eliquis 2 days. (3 months post DVT).   Practice name and name of physician performing surgery?      [facility]  What is your office phone and fax number?      Phone- [phone]  [phone]  Anesthesia type (None, local, MAC, general) ?       MAC

## 2019-10-11 NOTE — Telephone Encounter (Signed)
Primary Cardiologist:No primary care provider on file.  Chart reviewed as part of pre-operative protocol coverage. Because of Lisa Norton's past medical history and time since last visit, he/she will require a follow-up visit in order to better assess preoperative cardiovascular risk.  Pre-op covering staff: - Please schedule appointment and call patient to inform them. - Please contact requesting surgeon's office via preferred method (i.e, phone, fax) to inform them of need for appointment prior to surgery.  If applicable, this message will also be routed to pharmacy pool and/or primary cardiologist for input on holding anticoagulant/antiplatelet agent as requested below so that this information is available at time of patient's appointment.   Lisa Norton. [facility] [address] Office [phone] Fax [phone]

## 2019-10-11 NOTE — Telephone Encounter (Signed)
Patient is returning phone call.  °

## 2019-10-11 NOTE — Progress Notes (Signed)
Agree with assessment as outlined, I discussed the case with Amy Esterwood. She warrants a colonoscopy to clarify etiology of her bleeding symptoms, which sound chronic, and she has no anemia, Hgb is in the 15s which is reassuring. We will reach out to her prescribing physician of Eliquis to see when she would be able to hold the Eliquis for a brief period of time to do a colonoscopy (in case polypectomy is needed). If she can't hold it for a few months yet, and bleeding worsens or symptoms change, we can do a diagnostic colonoscopy on Eliquis to clarify etiology if needed. If she can't hold Eliquis until April and symptoms mild / stable, will wait until then to do this exam or do the exam sooner on the Eliquis if needed.

## 2019-10-11 NOTE — Telephone Encounter (Signed)
S/w pt she states that there is no need to reschedule it is fine the way it is. We will wait until scheduled appt w/Dr Gardiner Rhyme

## 2019-10-11 NOTE — Telephone Encounter (Signed)
LM2CB-DISCUSS CARDIAC CLEARANCE

## 2019-10-13 ENCOUNTER — Encounter: Payer: Self-pay | Admitting: Family Medicine

## 2019-10-15 ENCOUNTER — Other Ambulatory Visit: Payer: Self-pay | Admitting: Family Medicine

## 2019-10-15 DIAGNOSIS — B001 Herpesviral vesicular dermatitis: Secondary | ICD-10-CM

## 2019-10-15 MED ORDER — VALACYCLOVIR HCL 1 G PO TABS: 1000.0000 mg | ORAL_TABLET | Freq: Two times a day (BID) | ORAL | 6 refills | Status: AC

## 2019-10-19 ENCOUNTER — Other Ambulatory Visit: Payer: Self-pay

## 2019-10-22 ENCOUNTER — Ambulatory Visit: Payer: Medicaid Other | Admitting: Family Medicine

## 2019-10-22 ENCOUNTER — Encounter: Payer: Self-pay | Admitting: Family Medicine

## 2019-10-22 ENCOUNTER — Other Ambulatory Visit: Payer: Self-pay

## 2019-10-22 ENCOUNTER — Ambulatory Visit (HOSPITAL_COMMUNITY): Payer: Medicaid Other | Admitting: Psychiatry

## 2019-10-22 VITALS — BP 132/78 | HR 84 | Temp 98.6°F | Resp 20 | Ht 61.0 in | Wt 214.0 lb

## 2019-10-22 DIAGNOSIS — B373 Candidiasis of vulva and vagina: Secondary | ICD-10-CM | POA: Diagnosis not present

## 2019-10-22 DIAGNOSIS — N951 Menopausal and female climacteric states: Secondary | ICD-10-CM | POA: Diagnosis not present

## 2019-10-22 DIAGNOSIS — N898 Other specified noninflammatory disorders of vagina: Secondary | ICD-10-CM | POA: Diagnosis not present

## 2019-10-22 DIAGNOSIS — B3731 Acute candidiasis of vulva and vagina: Secondary | ICD-10-CM

## 2019-10-22 LAB — URINALYSIS, COMPLETE
Bilirubin, UA: NEGATIVE
Ketones, UA: NEGATIVE
Leukocytes,UA: NEGATIVE
Nitrite, UA: NEGATIVE
Protein,UA: NEGATIVE
Specific Gravity, UA: 1.02 (ref 1.005–1.030)
Urobilinogen, Ur: 0.2 mg/dL (ref 0.2–1.0)
pH, UA: 6 (ref 5.0–7.5)

## 2019-10-22 LAB — MICROSCOPIC EXAMINATION
Epithelial Cells (non renal): >10 /hpf — AB (ref 0–10)
Renal Epithel, UA: NONE SEEN /hpf

## 2019-10-22 LAB — WET PREP FOR TRICH, YEAST, CLUE
Clue Cell Exam: NEGATIVE
Trichomonas Exam: NEGATIVE
Yeast Exam: NEGATIVE

## 2019-10-22 MED ORDER — FLUCONAZOLE 150 MG PO TABS: ORAL_TABLET | ORAL | 0 refills | Status: DC

## 2019-10-22 NOTE — Progress Notes (Signed)
Subjective:  Patient ID: Lisa Norton, female    DOB: 07-26-1971, 49 y.o.   MRN: [id number]  Patient Care Team: Baruch Gouty, FNP as PCP - General (Family Medicine) Michael Boston, MD as Consulting Physician (General Surgery) Marti Sleigh, MD as Consulting Physician (Gynecology) Tag Wurtz, Connye Burkitt, FNP (Family Medicine)   Chief Complaint:  Vaginal Discharge   HPI: Lisa Norton is a 49 y.o. female presenting on 10/22/2019 for Vaginal Discharge   Vaginal Discharge The patient's primary symptoms include genital itching, a genital rash and vaginal discharge. The patient's pertinent negatives include no pelvic pain. This is a recurrent problem. The current episode started 1 to 4 weeks ago. The problem occurs constantly. The problem has been gradually worsening. The pain is mild. The problem affects both sides. She is not pregnant. Pertinent negatives include no abdominal pain, anorexia, back pain, chills, constipation, diarrhea, discolored urine, dysuria, fever, flank pain, frequency, headaches, hematuria, joint pain, joint swelling, nausea, painful intercourse, rash, sore throat, urgency or vomiting. The vaginal discharge was thick and white. There has been no bleeding. She has not been passing clots. She has not been passing tissue. The symptoms are aggravated by intercourse, urinating and activity. She has tried antifungals for the symptoms. The treatment provided mild relief. Her sexual activity is non-contributory to the current illness. No, her partner does not have an STD. She uses hysterectomy for contraception.    Relevant past medical, surgical, family, and social history reviewed and updated as indicated.  Allergies and medications reviewed and updated. Date reviewed: Chart in Epic.   Past Medical History:  Diagnosis Date  . Anxiety   . COLD SORE 05/15/2010  . DEPRESSION 12/12/2009  . Diabetes mellitus without complication ([place])   . High cholesterol   . 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 HYPERTENSION 12/12/2009  . TOBACCO ABUSE 12/12/2009    Past Surgical History:  Procedure Laterality Date  . BLADDER SUSPENSION    . CERVICAL FUSION    . CESAREAN SECTION WITH BILATERAL TUBAL LIGATION Bilateral 02/12/2015   Procedure: CESAREAN SECTION;  Surgeon: Florian Buff, MD;  Location: [facility];  Service: Obstetrics;  Laterality: Bilateral;  Fetal Demise  . CHOLECYSTECTOMY  2006  . EVALUATION UNDER ANESTHESIA WITH HEMORRHOIDECTOMY N/A 10/12/2018   Procedure: LATERAL INTERNAL HEMORRHOID LIGATION/PEXY ANORECTAL EXAM UNDER ANESTHESIA WITH HEMORRHOIDECTOMY X2;  Surgeon: Michael Boston, MD;  Location: [facility];  Service: General;  Laterality: N/A;  . FOOT SURGERY Right    Plantar, bone spurs  . FOOT SURGERY Bilateral 2019 and 2020   11/2018, 05/31/2019  . LAPAROSCOPIC VAGINAL HYSTERECTOMY WITH SALPINGECTOMY  02/16/2018   [facility]  . TONSILLECTOMY  1988    Social History   Socioeconomic History  . Marital status: Legally Separated    Spouse name: Not on file  . Number of children: 2  . Years of education: Not on file  . Highest education level: Not on file  Occupational History  . Not on file  Tobacco Use  . Smoking status: Current Every Day Smoker    Packs/day: 1.50    Years: 20.00    Pack years: 30.00    Types: Cigarettes  . Smokeless tobacco: Former Systems developer    Types: Snuff    Quit date: 05/05/1986  Substance and Sexual Activity  . Alcohol use: Yes    Comment: had a bout of heavy drinking 2015, drinks occasionally now   . Drug use: Not Currently  Comment: past use of marijuana about every other day for two years, last used 25 years ago.   Marland Kitchen Sexual activity: Yes    Birth control/protection: None, Surgical    Comment: pt states she did not have a tubal  Other Topics Concern  . Not on file  Social History Narrative  . Not on file   Social Determinants of Health   Financial Resource Strain:   . Difficulty of Paying Living  Expenses: Not on file  Food Insecurity:   . Worried About Charity fundraiser in the Last Year: Not on file  . Ran Out of Food in the Last Year: Not on file  Transportation Needs:   . Lack of Transportation (Medical): Not on file  . Lack of Transportation (Non-Medical): Not on file  Physical Activity:   . Days of Exercise per Week: Not on file  . Minutes of Exercise per Session: Not on file  Stress:   . Feeling of Stress : Not on file  Social Connections:   . Frequency of Communication with Friends and Family: Not on file  . Frequency of Social Gatherings with Friends and Family: Not on file  . Attends Religious Services: Not on file  . Active Member of Clubs or Organizations: Not on file  . Attends Archivist Meetings: Not on file  . Marital Status: Not on file  Intimate Partner Violence:   . Fear of Current or Ex-Partner: Not on file  . Emotionally Abused: Not on file  . Physically Abused: Not on file  . Sexually Abused: Not on file    Outpatient Encounter Medications as of 10/22/2019  Medication Sig  . Accu-Chek FastClix Lancets MISC 4 TIMES DAILY  . ACCU-CHEK GUIDE test strip 4 TIMES DAILY  . apixaban (ELIQUIS) 5 MG TABS tablet Take 2 tablets ( 10 mg ) twice a day for 7 days then take 1 tablet ( 5 mg ) twice a day  . ARIPiprazole (ABILIFY) 2 MG tablet Take 1 tablet (2 mg total) by mouth daily.  . cetirizine (ZYRTEC) 10 MG tablet Take 1 tablet (10 mg total) by mouth daily.  . clobetasol cream (TEMOVATE) 0.05 % APPLY AT BEDTIME  . clotrimazole (CLOTRIMAZOLE ATHLETES FOOT) 1 % cream Apply 1 application topically 2 (two) times daily.  . dapagliflozin propanediol (FARXIGA) 10 MG TABS tablet Take 10 mg by mouth daily.  Marland Kitchen EPINEPHrine (EPIPEN 2-PAK) 0.3 mg/0.3 mL IJ SOAJ injection Inject 0.3 mLs (0.3 mg total) into the muscle as needed for anaphylaxis.  . famotidine (PEPCID) 20 MG tablet Take 1 tablet (20 mg total) by mouth 2 (two) times daily.  Marland Kitchen FLUoxetine (PROZAC) 40 MG  capsule Take 1 capsule (40 mg total) by mouth daily.  . fluticasone (FLONASE) 50 MCG/ACT nasal spray Place 2 sprays into both nostrils daily.  Marland Kitchen gabapentin (NEURONTIN) 300 MG capsule Take 1 capsule (300 mg total) by mouth 3 (three) times daily. Increase to 4x/day as needed  . hydrOXYzine (VISTARIL) 50 MG capsule TAKE 2 CAPSULES 3 TIMES DAILY AS NEEDED  . insulin detemir (LEVEMIR) 100 UNIT/ML injection INJECT 0.3ML (30 UNITS TOTAL) SQ AT BEDTIME  . levocetirizine (XYZAL) 5 MG tablet Take 1 tablet (5 mg total) by mouth every evening.  . liraglutide (VICTOZA) 18 MG/3ML SOPN Inject 0.3 mLs (1.8 mg total) into the skin daily.  . magnesium hydroxide (MILK OF MAGNESIA) 400 MG/5ML suspension Take 5-10 mLs by mouth daily as needed for mild constipation.  . mupirocin ointment (BACTROBAN)  2 % Apply 1 application topically 2 (two) times daily.  . Oxycodone HCl 10 MG TABS Take 10 mg by mouth 4 (four) times daily as needed.  Marland Kitchen oxyCODONE-acetaminophen (PERCOCET) 5-325 MG tablet Take 1 tablet by mouth every 6 (six) hours as needed for severe pain.  . pravastatin (PRAVACHOL) 80 MG tablet TAKE ONE (1) TABLET EACH DAY  . ULTICARE MICRO PEN NEEDLES 32G X 4 MM MISC 3 TIMES DAILY  . valACYclovir (VALTREX) 1000 MG tablet Take 1 tablet (1,000 mg total) by mouth 2 (two) times daily.  . fluconazole (DIFLUCAN) 150 MG tablet 1 po q week x 4 weeks   No facility-administered encounter medications on file as of 10/22/2019.    Allergies  Allergen Reactions  . Metformin And Related Diarrhea    Review of Systems  Constitutional: Negative for activity change, appetite change, chills, diaphoresis, fatigue, fever and unexpected weight change.  HENT: Negative.  Negative for sore throat.   Eyes: Negative.   Respiratory: Negative for cough, chest tightness and shortness of breath.   Cardiovascular: Negative for chest pain, palpitations and leg swelling.  Gastrointestinal: Negative for abdominal pain, anorexia, blood in stool,  constipation, diarrhea, nausea and vomiting.  Endocrine: Negative.   Genitourinary: Positive for dyspareunia, vaginal discharge and vaginal pain. Negative for decreased urine volume, difficulty urinating, dysuria, enuresis, flank pain, frequency, genital sores, hematuria, menstrual problem, pelvic pain, urgency and vaginal bleeding.  Musculoskeletal: Negative for arthralgias, back pain, joint pain and myalgias.  Skin: Negative.  Negative for rash.  Allergic/Immunologic: Negative.   Neurological: Negative for dizziness, tremors, seizures, syncope, facial asymmetry, speech difficulty, weakness, light-headedness, numbness and headaches.  Hematological: Negative.   Psychiatric/Behavioral: Negative for confusion, hallucinations, sleep disturbance and suicidal ideas.  All other systems reviewed and are negative.       Objective:  BP 132/78   Pulse 84   Temp 98.6 F (37 C)   Resp 20   Ht 5\' 1"  (1.549 m)   Wt 214 lb (97.1 kg)   LMP 09/07/2017   SpO2 97%   BMI 40.43 kg/m    Wt Readings from Last 3 Encounters:  10/22/19 214 lb (97.1 kg)  10/10/19 218 lb (98.9 kg)  09/20/19 210 lb (95.3 kg)    Physical Exam Vitals and nursing note reviewed.  Constitutional:      General: She is not in acute distress.    Appearance: Normal appearance. She is well-developed and well-groomed. She is obese. She is not ill-appearing, toxic-appearing or diaphoretic.  HENT:     Head: Normocephalic and atraumatic.     Jaw: There is normal jaw occlusion.     Right Ear: Hearing normal.     Left Ear: Hearing normal.     Nose: Nose normal.     Mouth/Throat:     Lips: Pink.     Mouth: Mucous membranes are moist.     Pharynx: Oropharynx is clear. Uvula midline.  Eyes:     General: Lids are normal.     Extraocular Movements: Extraocular movements intact.     Conjunctiva/sclera: Conjunctivae normal.     Pupils: Pupils are equal, round, and reactive to light.  Neck:     Thyroid: No thyroid mass, thyromegaly  or thyroid tenderness.     Vascular: No carotid bruit or JVD.     Trachea: Trachea and phonation normal.  Cardiovascular:     Rate and Rhythm: Normal rate and regular rhythm.     Chest Wall: PMI is not displaced.  Pulses: Normal pulses.     Heart sounds: Normal heart sounds. No murmur. No friction rub. No gallop.   Pulmonary:     Effort: Pulmonary effort is normal. No respiratory distress.     Breath sounds: Normal breath sounds. No wheezing.  Abdominal:     General: Bowel sounds are normal. There is no distension or abdominal bruit.     Palpations: Abdomen is soft. There is no hepatomegaly or splenomegaly.     Tenderness: There is no abdominal tenderness. There is no right CVA tenderness or left CVA tenderness.     Hernia: No hernia is present.  Genitourinary:    Comments: Redness to external vagina and bilateral labia. Pt did self wet prep Musculoskeletal:        General: Normal range of motion.     Cervical back: Normal range of motion and neck supple.     Right lower leg: No edema.     Left lower leg: No edema.  Lymphadenopathy:     Cervical: No cervical adenopathy.  Skin:    General: Skin is warm and dry.     Capillary Refill: Capillary refill takes less than 2 seconds.     Coloration: Skin is not cyanotic, jaundiced or pale.     Findings: No rash.  Neurological:     General: No focal deficit present.     Mental Status: She is alert and oriented to person, place, and time.     Cranial Nerves: Cranial nerves are intact. No cranial nerve deficit.     Sensory: Sensation is intact. No sensory deficit.     Motor: Motor function is intact. No weakness.     Coordination: Coordination is intact. Coordination normal.     Gait: Gait is intact. Gait normal.     Deep Tendon Reflexes: Reflexes are normal and symmetric. Reflexes normal.  Psychiatric:        Attention and Perception: Attention and perception normal.        Mood and Affect: Mood and affect normal.        Speech:  Speech normal.        Behavior: Behavior normal. Behavior is cooperative.        Thought Content: Thought content normal.        Cognition and Memory: Cognition and memory normal.        Judgment: Judgment normal.     Results for orders placed or performed in visit on 10/10/19  CBC with Differential/Platelet  Result Value Ref Range   WBC 7.2 4.0 - 10.5 K/uL   RBC 4.91 3.87 - 5.11 Mil/uL   Hemoglobin 15.9 (H) 12.0 - 15.0 g/dL   HCT 47.9 (H) 36.0 - 46.0 %   MCV 97.6 78.0 - 100.0 fl   MCHC 33.3 30.0 - 36.0 g/dL   RDW 13.4 11.5 - 15.5 %   Platelets 176.0 150.0 - 400.0 K/uL   Neutrophils Relative % 59.4 43.0 - 77.0 %   Lymphocytes Relative 31.4 12.0 - 46.0 %   Monocytes Relative 6.5 3.0 - 12.0 %   Eosinophils Relative 2.2 0.0 - 5.0 %   Basophils Relative 0.5 0.0 - 3.0 %   Neutro Abs 4.3 1.4 - 7.7 K/uL   Lymphs Abs 2.3 0.7 - 4.0 K/uL   Monocytes Absolute 0.5 0.1 - 1.0 K/uL   Eosinophils Absolute 0.2 0.0 - 0.7 K/uL   Basophils Absolute 0.0 0.0 - 0.1 K/uL     wet prep negative in office, signs and symptoms  consistent with yeast.  Urinalysis positive for trace blood, 3+ glucose, and few bacteria.   Pertinent labs & imaging results that were available during my care of the patient were reviewed by me and considered in my medical decision making.  Assessment & Plan:  Rhayne was seen today for vaginal discharge.  Diagnoses and all orders for this visit:  Vaginal discharge Wet prep and urinalysis in office.  -     WET PREP FOR TRICH, YEAST, CLUE -     Urinalysis, Complete  Menopausal vaginal dryness Pt aware to trial Luvena over the counter. Will discuss HRT if Samul Dada is not beneficial.   Candidal vaginitis Signs and symptoms concerning for candidal vaginitis. Will treat with below. Pt aware to report any new, worsening, or persistent symptoms.  -     fluconazole (DIFLUCAN) 150 MG tablet; 1 po q week x 4 weeks     Continue all other maintenance medications.  Follow up  plan: Return if symptoms worsen or fail to improve.  Continue healthy lifestyle choices, including diet (rich in fruits, vegetables, and lean proteins, and low in salt and simple carbohydrates) and exercise (at least 30 minutes of moderate physical activity daily).  Educational handout given for atrophic vaginitis   The above assessment and management plan was discussed with the patient. The patient verbalized understanding of and has agreed to the management plan. Patient is aware to call the clinic if they develop any new symptoms or if symptoms persist or worsen. Patient is aware when to return to the clinic for a follow-up visit. Patient educated on when it is appropriate to go to the emergency department.   Monia Pouch, FNP-C [facility] [phone]

## 2019-10-22 NOTE — Patient Instructions (Signed)
Atrophic Vaginitis Atrophic vaginitis is a condition in which the tissues that line the vagina become dry and thin. This condition occurs in women who have stopped having their period. It is caused by a drop in a female hormone (estrogen). This hormone helps:  To keep the vagina moist.  To make a clear fluid. This clear fluid helps: ? To make the vagina ready for sex. ? To protect the vagina from infection. If the lining of the vagina is dry and thin, it may cause irritation, burning, or itchiness. It may also:  Make sex painful.  Make an exam of your vagina painful.  Cause bleeding.  Make you lose interest in sex.  Cause a burning feeling when you pee (urinate).  Cause a brown or yellow fluid to come from your vagina. Some women do not have symptoms. Follow these instructions at home: Medicines  Take over-the-counter and prescription medicines only as told by your doctor.  Do not use herbs or other medicines unless your doctor says it is okay.  Use medicines for for dryness. These include: ? Oils to make the vagina soft. ? Creams. ? Moisturizers. General instructions  Do not douche.  Do not use products that can make your vagina dry. These include: ? Scented sprays. ? Scented tampons. ? Scented soaps.  Sex can help increase blood flow and soften the tissue in the vagina. If it hurts to have sex: ? Tell your partner. ? Use products to make sex more comfortable. Use these only as told by your doctor. Contact a doctor if you:  Have discharge from the vagina that is different than usual.  Have a bad smell coming from your vagina.  Have new symptoms.  Do not get better.  Get worse. Summary  Atrophic vaginitis is a condition in which the lining of the vagina becomes dry and thin.  This condition affects women who have stopped having their periods.  Treatment may include using products that help make the vagina soft.  Call a doctor if do not get better with  treatment. This information is not intended to replace advice given to you by your health care provider. Make sure you discuss any questions you have with your health care provider. Document Revised: 08/29/2017 Document Reviewed: 08/29/2017 Elsevier Patient Education  2020 [employer] Inc[employer]  

## 2019-10-24 ENCOUNTER — Other Ambulatory Visit: Payer: Self-pay | Admitting: Podiatry

## 2019-10-24 ENCOUNTER — Other Ambulatory Visit: Payer: Self-pay

## 2019-10-24 ENCOUNTER — Ambulatory Visit (INDEPENDENT_AMBULATORY_CARE_PROVIDER_SITE_OTHER): Payer: Medicaid Other

## 2019-10-24 ENCOUNTER — Ambulatory Visit (INDEPENDENT_AMBULATORY_CARE_PROVIDER_SITE_OTHER): Payer: Medicaid Other | Admitting: Podiatry

## 2019-10-24 DIAGNOSIS — M7732 Calcaneal spur, left foot: Secondary | ICD-10-CM

## 2019-10-24 DIAGNOSIS — Z09 Encounter for follow-up examination after completed treatment for conditions other than malignant neoplasm: Secondary | ICD-10-CM

## 2019-10-24 DIAGNOSIS — M722 Plantar fascial fibromatosis: Secondary | ICD-10-CM | POA: Diagnosis not present

## 2019-10-25 ENCOUNTER — Ambulatory Visit: Payer: Medicaid Other | Admitting: Family Medicine

## 2019-10-26 ENCOUNTER — Ambulatory Visit (INDEPENDENT_AMBULATORY_CARE_PROVIDER_SITE_OTHER): Payer: Medicaid Other | Admitting: Psychiatry

## 2019-10-26 ENCOUNTER — Other Ambulatory Visit: Payer: Self-pay

## 2019-10-26 ENCOUNTER — Telehealth: Payer: Self-pay | Admitting: *Deleted

## 2019-10-26 DIAGNOSIS — M7732 Calcaneal spur, left foot: Secondary | ICD-10-CM

## 2019-10-26 DIAGNOSIS — F4310 Post-traumatic stress disorder, unspecified: Secondary | ICD-10-CM

## 2019-10-26 DIAGNOSIS — F331 Major depressive disorder, recurrent, moderate: Secondary | ICD-10-CM | POA: Diagnosis not present

## 2019-10-26 DIAGNOSIS — M722 Plantar fascial fibromatosis: Secondary | ICD-10-CM

## 2019-10-26 DIAGNOSIS — Z09 Encounter for follow-up examination after completed treatment for conditions other than malignant neoplasm: Secondary | ICD-10-CM
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
## 2019-10-26 NOTE — Telephone Encounter (Signed)
Faxed referral, and demographics to [facility].

## 2019-10-26 NOTE — Telephone Encounter (Signed)
[facility] - Lisa Norton states they do not accept adult medicaid. Faxed referral to [facility].

## 2019-10-30 ENCOUNTER — Ambulatory Visit: Payer: Medicaid Other | Attending: Podiatry | Admitting: Physical Therapy

## 2019-11-01 ENCOUNTER — Telehealth: Payer: Self-pay | Admitting: *Deleted

## 2019-11-01 NOTE — Telephone Encounter (Signed)
I spoke with pt and told her I needed her new pharmacy and I would inform Dr. Amalia Hailey she had called for the prescription. Pt states she changed to the [facility] yesterday. I changed in the Meds & Orders.

## 2019-11-01 NOTE — Telephone Encounter (Signed)
Pt called and states Dr. Amalia Hailey was going to order a steroid, but it is not at her old or new pharmacy.

## 2019-11-05 ENCOUNTER — Other Ambulatory Visit: Payer: Self-pay | Admitting: Podiatry

## 2019-11-05 MED ORDER — METHYLPREDNISOLONE 4 MG PO TBPK: ORAL_TABLET | ORAL | 0 refills | Status: DC

## 2019-11-05 NOTE — Telephone Encounter (Signed)
Rx sent to [facility]. - Dr. Amalia Hailey

## 2019-11-05 NOTE — Progress Notes (Signed)
PRN pain and inflammation - Dr. Amalia Hailey

## 2019-11-05 NOTE — Progress Notes (Signed)
PRN pain and inflammation

## 2019-11-06 NOTE — Progress Notes (Signed)
   Subjective:  Patient presents today status post posterior and inferior heel spur resection right. DOS: 12/07/2018 and status post posterior and inferior heel spur resection left DOS: 05/31/2019. She reports a new complaint of gradually worsening throbbing pain to the bilateral plantar and posterior heels that has been ongoing for the past several months. Walking increases the pain. She has not had any recent treatment for the symptoms. Patient is here for further evaluation and treatment.   Past Medical History:  Diagnosis Date  . Anxiety   . COLD SORE 05/15/2010  . DEPRESSION 12/12/2009  . Diabetes mellitus without complication ([place])   . High cholesterol   . 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 HYPERTENSION 12/12/2009  . TOBACCO ABUSE 12/12/2009      Objective/Physical Exam Neurovascular status intact.  Skin incisions appear to be well coapted. No sign of infectious process noted. No dehiscence. No active bleeding noted. Moderate edema noted to the surgical extremity. Pain with palpation noted to the bilateral heels.   Radiographic Exam:  Normal osseous mineralization. Joint spaces preserved. No fracture/dislocation/boney destruction.     Assessment: 1. s/p posterior and inferior heel spur resection left. DOS: 05/31/2019 2. s/p posterior and inferior heel spur resection right. DOS: 12/07/2018 3. DVT LLE 4. Inflammatory plantar fasciitis bilateral     Plan of Care:  1. Patient was evaluated. X-Rays reviewed.  2. Injection of 0.5 mLs Celestone Soluspan injected into the bilateral heels.  3. Prescription for Medrol Dose Pak provided to patient. 4. Continue management with vascular, Dr. Scot Dock.  5. Resume physical therapy.  6. Return to clinic in 4 weeks.    Edrick Kins, DPM [facility]  Dr. Edrick Kins, [address]                                        [place], [place] [address]                Office [phone]  Fax [phone]

## 2019-11-08 ENCOUNTER — Encounter: Payer: Self-pay | Admitting: Physician Assistant

## 2019-11-08 ENCOUNTER — Other Ambulatory Visit: Payer: Self-pay

## 2019-11-08 ENCOUNTER — Ambulatory Visit: Payer: Medicaid Other | Admitting: Physician Assistant

## 2019-11-08 ENCOUNTER — Telehealth: Payer: Self-pay

## 2019-11-08 VITALS — BP 110/70 | HR 98 | Temp 97.8°F | Ht 61.0 in | Wt 215.0 lb

## 2019-11-08 DIAGNOSIS — R109 Unspecified abdominal pain: Secondary | ICD-10-CM

## 2019-11-08 DIAGNOSIS — K649 Unspecified hemorrhoids: Secondary | ICD-10-CM

## 2019-11-08 DIAGNOSIS — K219 Gastro-esophageal reflux disease without esophagitis: Secondary | ICD-10-CM | POA: Diagnosis not present

## 2019-11-08 DIAGNOSIS — K625 Hemorrhage of anus and rectum: Secondary | ICD-10-CM

## 2019-11-08 DIAGNOSIS — Z01818 Encounter for other preprocedural examination: Secondary | ICD-10-CM

## 2019-11-08 DIAGNOSIS — R6881 Early satiety: Secondary | ICD-10-CM

## 2019-11-08 DIAGNOSIS — R112 Nausea with vomiting, unspecified: Secondary | ICD-10-CM

## 2019-11-08 MED ORDER — SUPREP BOWEL PREP KIT 17.5-3.13-1.6 GM/177ML PO SOLN: 1.0000 | ORAL | 0 refills | Status: DC

## 2019-11-08 MED ORDER — ONDANSETRON HCL 4 MG PO TABS: 4.0000 mg | ORAL_TABLET | Freq: Four times a day (QID) | ORAL | 1 refills | Status: DC | PRN

## 2019-11-08 MED ORDER — PANTOPRAZOLE SODIUM 40 MG PO TBEC: 40.0000 mg | DELAYED_RELEASE_TABLET | Freq: Every day | ORAL | 8 refills | Status: DC

## 2019-11-08 NOTE — Progress Notes (Signed)
Subjective:    Patient ID: Lisa Norton, female    DOB: [birth year], 49 y.o.   MRN: [id number]  HPI Lisa "Octavia Velador" is a pleasant 49 year old white female, recently established and seen by myself in February 2021.  She comes back in today for follow-up and to discuss scheduling colonoscopy. Patient had presented with recurrent hematochezia over the previous couple of months and increased since she was started on Eliquis in early January after a new DVT.  She has history of hemorrhoidectomy in 2024 grade 3 and 4 internal and external hemorrhoids and had undergone an internal sphincterotomy for persistent anal fissure at that time as well. She continues to have intermittent hematochezia, usually just seeing blood on the tissue but occasionally seeing blood in the commode as well.  Bowel movements have been regular and she denies any abdominal or rectal pain today.  She has a prescription for hydrocortisone suppositories but has not been needed them recently. She is complaining of some bloating and early satiety which has been worse over the past few weeks.  She says she feels full and uncomfortable.  Has nausea but no vomiting Has follow-up with Dr. Shuman/cardiology in mid April, he is prescribing the Eliquis and had planned a 65-monthcourse. Other medical issues include obesity, hypertension, GERD, hyperlipidemia and adult onset diabetes mellitus.  Review of Systems Pertinent positive and negative review of systems were noted in the above HPI section.  All other review of systems was otherwise negative.  Outpatient Encounter Medications as of 11/08/2019  Medication Sig  . Accu-Chek FastClix Lancets MISC 4 TIMES DAILY  . ACCU-CHEK GUIDE test strip 4 TIMES DAILY  . apixaban (ELIQUIS) 5 MG TABS tablet Take 2 tablets ( 10 mg ) twice a day for 7 days then take 1 tablet ( 5 mg ) twice a day  . ARIPiprazole (ABILIFY) 2 MG tablet Take 1 tablet (2 mg total) by mouth daily.  . cetirizine (ZYRTEC) 10 MG  tablet Take 1 tablet (10 mg total) by mouth daily.  . clobetasol cream (TEMOVATE) 0.05 % APPLY AT BEDTIME  . clotrimazole (CLOTRIMAZOLE ATHLETES FOOT) 1 % cream Apply 1 application topically 2 (two) times daily.  . dapagliflozin propanediol (FARXIGA) 10 MG TABS tablet Take 10 mg by mouth daily.  .Marland KitchenEPINEPHrine (EPIPEN 2-PAK) 0.3 mg/0.3 mL IJ SOAJ injection Inject 0.3 mLs (0.3 mg total) into the muscle as needed for anaphylaxis.  . famotidine (PEPCID) 20 MG tablet Take 1 tablet (20 mg total) by mouth 2 (two) times daily.  . fluconazole (DIFLUCAN) 150 MG tablet 1 po q week x 4 weeks  . FLUoxetine (PROZAC) 40 MG capsule Take 1 capsule (40 mg total) by mouth daily.  . fluticasone (FLONASE) 50 MCG/ACT nasal spray Place 2 sprays into both nostrils daily.  .Marland Kitchengabapentin (NEURONTIN) 300 MG capsule Take 1 capsule (300 mg total) by mouth 3 (three) times daily. Increase to 4x/day as needed  . hydrOXYzine (VISTARIL) 50 MG capsule TAKE 2 CAPSULES 3 TIMES DAILY AS NEEDED  . insulin detemir (LEVEMIR) 100 UNIT/ML injection INJECT 0.3ML (30 UNITS TOTAL) SQ AT BEDTIME  . levocetirizine (XYZAL) 5 MG tablet Take 1 tablet (5 mg total) by mouth every evening.  . liraglutide (VICTOZA) 18 MG/3ML SOPN Inject 0.3 mLs (1.8 mg total) into the skin daily.  . magnesium hydroxide (MILK OF MAGNESIA) 400 MG/5ML suspension Take 5-10 mLs by mouth daily as needed for mild constipation.  . methylPREDNISolone (MEDROL DOSEPAK) 4 MG TBPK tablet 6 day dose  pack - take as directed  . mupirocin ointment (BACTROBAN) 2 % Apply 1 application topically 2 (two) times daily.  . Oxycodone HCl 10 MG TABS Take 10 mg by mouth 4 (four) times daily as needed.  Marland Kitchen oxyCODONE-acetaminophen (PERCOCET) 5-325 MG tablet Take 1 tablet by mouth every 6 (six) hours as needed for severe pain.  . pravastatin (PRAVACHOL) 80 MG tablet TAKE ONE (1) TABLET EACH DAY  . ULTICARE MICRO PEN NEEDLES 32G X 4 MM MISC 3 TIMES DAILY  . valACYclovir (VALTREX) 1000 MG tablet  Take 1 tablet (1,000 mg total) by mouth 2 (two) times daily.  . Na Sulfate-K Sulfate-Mg Sulf (SUPREP BOWEL PREP KIT) 17.5-3.13-1.6 GM/177ML SOLN Take 1 kit by mouth as directed. For colonoscopy prep  . ondansetron (ZOFRAN) 4 MG tablet Take 1 tablet (4 mg total) by mouth every 6 (six) hours as needed for nausea or vomiting.  . pantoprazole (PROTONIX) 40 MG tablet Take 1 tablet (40 mg total) by mouth daily.   No facility-administered encounter medications on file as of 11/08/2019.   Allergies  Allergen Reactions  . Metformin And Related Diarrhea   Patient Active Problem List   Diagnosis Date Noted  . Menopausal vaginal dryness 10/22/2019  . Myalgia 09/24/2019  . Gastroesophageal reflux disease without esophagitis 08/18/2019  . Neuropathy due to type 2 diabetes mellitus ([place]) 08/17/2019  . Pain in both lower extremities 08/17/2019  . Low back pain 05/17/2019  . Elevated alkaline phosphatase level 04/26/2019  . Xerostomia 03/20/2019  . PTSD (post-traumatic stress disorder) 02/07/2019  . MDD (major depressive disorder), recurrent episode, moderate ([place]) 02/07/2019  . Allergy to alpha-gal 01/23/2019  . Seasonal and perennial allergic rhinitis 01/02/2019  . Recurrent urticaria 12/04/2018  . 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 Prolapsed internal hemorrhoids, grade 2-4 s/p hemorrhoidal ligation/pexy/resection 10/12/2018 10/12/2018  . Trochanteric bursitis, left hip 09/14/2018  . Rectocele 01/05/2018  . Uterovaginal prolapse 01/05/2018  . Urinary incontinence, mixed 12/16/2017  . S/P cesarean section 02/12/2015  . Fetal demise, greater than 22 weeks, antepartum   . 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 Morbid obesity ([place]) 08/13/2014  . C7 radiculopathy 02/11/2014  . Hyperlipidemia associated with type 2 diabetes mellitus ([place]) 01/04/2011  . Type 2 diabetes mellitus ([place]) 01/04/2011  . TOBACCO ABUSE 12/12/2009  . Hypertension  associated with type 2 diabetes mellitus ([place]) 12/12/2009   Social History   Socioeconomic History  . Marital status: Legally Separated    Spouse name: Not on file  . Number of children: 2  . Years of education: Not on file  . Highest education level: Not on file  Occupational History  . Occupation: unemployed  Tobacco Use  . Smoking status: Current Every Day Smoker    Packs/day: 1.50    Years: 20.00    Pack years: 30.00    Types: Cigarettes  . Smokeless tobacco: Former Systems developer    Types: Snuff    Quit date: 05/05/1986  Substance and Sexual Activity  . Alcohol use: Not Currently    Comment: had a bout of heavy drinking 2015, drinks occasionally now   . Drug use: Not Currently    Comment: past use of marijuana about every other day for two years, last used 25 years ago.   Marland Kitchen Sexual activity: Yes    Birth control/protection: None, Surgical    Comment: pt states she did not have a tubal  Other Topics Concern  . Not on file  Social History  Narrative  . Not on file   Social Determinants of Health   Financial Resource Strain:   . Difficulty of Paying Living Expenses:   Food Insecurity:   . Worried About Charity fundraiser in the Last Year:   . Arboriculturist in the Last Year:   Transportation Needs:   . Film/video editor (Medical):   Marland Kitchen Lack of Transportation (Non-Medical):   Physical Activity:   . Days of Exercise per Week:   . Minutes of Exercise per Session:   Stress:   . Feeling of Stress :   Social Connections:   . Frequency of Communication with Friends and Family:   . Frequency of Social Gatherings with Friends and Family:   . Attends Religious Services:   . Active Member of Clubs or Organizations:   . Attends Archivist Meetings:   Marland Kitchen Marital Status:   Intimate Partner Violence:   . Fear of Current or Ex-Partner:   . Emotionally Abused:   Marland Kitchen Physically Abused:   . Sexually Abused:     Lisa Norton family history includes Bipolar disorder in her  sister; Heart disease in her mother; Narcolepsy in her mother.      Objective:    Vitals:   11/08/19 0937  BP: 110/70  Pulse: 98  Temp: 97.8 F (36.6 C)    Physical Exam Well-developed well-nourished WFin no acute distress.  Height, [id number], BMI 40.6  HEENT; nontraumatic normocephalic, EOMI, PE RR LA, sclera anicteric. Oropharynx; not examined/mask/Covid Neck; supple, no JVD Cardiovascular; regular rate and rhythm with S1-S2, no murmur rub or gallop Pulmonary; Clear bilaterally Abdomen; soft, nontender, nondistended, no palpable mass or hepatosplenomegaly, bowel sounds are active Rectal; not done today Skin; benign exam, no jaundice rash or appreciable lesions Extremities; no clubbing cyanosis or edema skin warm and dry Neuro/Psych; alert and oriented x4, grossly nonfocal mood and affect appropriate       Assessment & Plan:   #16 49 year old female with 3-4 month history of intermittent hematochezia, she is status post surgical hemorrhoidectomy for internal and external hemorrhoids 2020 and also had internal sphincterotomy done at that time for persistent anal fissure.  Etiology of current bleeding felt secondary to a recurrent internal hemorrhoids with bleeding exacerbated on Eliquis.  Patient has not had prior colonoscopy so therefore need to rule out other occult colon lesion, proctitis etc.  #2 DVT January 2021-on Eliquis with planned 57-monthcourse #3 GERD #4 early satiety and nausea-rule out gastroparesis, rule out chronic gastropathy, functional dyspepsia  Plan; stop Pepcid and start Protonix 40 mg p.o. every morning Patient will be scheduled for gastric emptying scan She was given a copy of gastroparesis step 3 diet Continue Zofran 4 mg every 6 hours as needed for nausea We will go ahead and schedule for colonoscopy and EGD with Dr. AHavery Moros  Both procedures were discussed in detail with the patient including indications risks and benefits and she is agreeable  to proceed.  Procedures will be scheduled in April. She will need to stop Eliquis for 2 days prior to procedures.  She has follow-up with Dr. Shuman/cardiology in mid April.  We will communicate with him to assure that holding Eliquis for 2 days is reasonable for this patient.   Lisa Norton SGenia HaroldPA-C 11/08/2019   Cc: Lisa Gouty FNP

## 2019-11-08 NOTE — Telephone Encounter (Signed)
Patient with diagnosis of DVT on ELIQUISfor anticoagulation.    Procedure: ENDO/COLONOSCOPY Date of procedure: 12/14/2019  CrCl > 50ML/MIN  *ELIQUIS prescribed after acute DVT for 3 months therapy - tentative end date 12/11/2019* *NO cardiac indication noted*   Please contact PCP to determine need to additional anticoagulation therapy*

## 2019-11-08 NOTE — Patient Instructions (Addendum)
Stop Pepcid.   Start Protonix 40mg  - once daily.  We have sent the following medications to your pharmacy for you to pick up at your convenience: Zofran, Protonix, Suprep   You have been scheduled for an endoscopy and colonoscopy. Please follow the written instructions given to you at your visit today. Please pick up your prep supplies at the pharmacy within the next 1-3 days. If you use inhalers (even only as needed), please bring them with you on the day of your procedure.   You will be contaced by our office prior to your procedure for directions on holding your Eliquis.  If you do not hear from our office 1 week prior to your scheduled procedure, please call [phone] to discuss.  You have been scheduled for a gastric emptying scan at [facility] on 3/19/21at 7:30am. Please arrive at least 15 minutes prior to your appointment for registration. Please make certain not to have anything to eat or drink after midnight the night before your test. Hold all stomach medications (ex: Zofran, phenergan, Reglan) 48 hours prior to your test. If you need to reschedule your appointment, please contact radiology scheduling at [phone]. _____________________________________________________________________ A gastric-emptying study measures how long it takes for food to move through your stomach. There are several ways to measure stomach emptying. In the most common test, you eat food that contains a small amount of radioactive material. A scanner that detects the movement of the radioactive material is placed over your abdomen to monitor the rate at which food leaves your stomach. This test normally takes about 4 hours to complete. _____________________________________________________________________  _  x_   ORAL DIABETIC MEDICATION INSTRUCTIONS  The day before your procedure:  Take your diabetic pill as you do normally  The day of your procedure:  Do not take your diabetic pill    We will check your blood sugar levels during the admission process and again in Recovery before discharging you home    _ x _   INSULIN (LONG ACTING) MEDICATION INSTRUCTIONS  Levemir   The day before your procedure:  Take  your regular evening dose    The day of your procedure:  Do not take your morning dose     The day before your procedure:  Do not take your evening dose   The day of your procedure:  Do not take your morning dose  ______________________________________________________________________                _x_ OTHER NON-INSULIN INJECTABLE MEDICATIONS     Victoza  Hold the am of the procedure

## 2019-11-08 NOTE — Telephone Encounter (Signed)
Request for surgical clearance:     Endoscopy Procedure  What type of surgery is being performed?     Endo/Colon  When is this surgery scheduled?     12/14/19  What type of clearance is required ?   Pharmacy  Are there any medications that need to be held prior to surgery and how long? Eliquis x2 days prior to procedure.  Practice name and name of physician performing surgery?      [facility]  What is your office phone and fax number?      Phone- [phone]  [phone]  Anesthesia type (None, local, MAC, general) ?       MAC

## 2019-11-08 NOTE — Telephone Encounter (Signed)
Pt with recent provoked DVT diagnosed 09/12/2019 and started on Eliquis.   Per GI: "She warrants a colonoscopy to clarify etiology of her bleeding symptoms, which sound chronic, and she has no anemia, Hgb is in the 15s which is reassuring. We will reach out to her prescribing physician of Eliquis to see when she would be able to hold the Eliquis for a brief period of time to do a colonoscopy (in case polypectomy is needed). If she can't hold it for a few months yet, and bleeding worsens or symptoms change, we can do a diagnostic colonoscopy on Eliquis to clarify etiology if needed. If she can't hold Eliquis until April and symptoms mild / stable, will wait until then to do this exam or do the exam sooner on the Eliquis if needed."  I will route to our pharmacist for input on holding A/C.

## 2019-11-09 NOTE — Telephone Encounter (Signed)
Dr. Francella Solian Just to clarify, GI would like a 2 day hold. Are you ok with 2 days or would you prefer 1?

## 2019-11-09 NOTE — Telephone Encounter (Signed)
   Primary Cardiologist:Christopher Fritz Pickerel, MD  Chart reviewed as part of pre-operative protocol coverage. Pre-op clearance already addressed by colleagues in earlier phone notes. To summarize recommendations:  -Pt with recent provoked DVT diagnosed 09/12/2019 and started on Eliquis. Diagnosed by Dr. Allen Kell then seen by Dr. Scot Dock for further input.  -Per Dr. Gardiner Rhyme, her DVT is distal, so risk of embolization is low. OK to hold Eliquis for 2 days.   Will route this bundled recommendation to requesting provider via Epic fax function. Please call with questions.  Daune Perch, NP 11/09/2019, 2:56 PM

## 2019-11-09 NOTE — Telephone Encounter (Signed)
Dr. Gardiner Rhyme and/or Dr. Scot Dock  Pt with recent provoked DVT diagnosed 09/12/2019 and started on Eliquis. Diagnosed by Dr. Allen Kell then seen by Dr. Scot Dock for further input. Now with 3-4 month history of intermittent hematochezia.  Per GI: "She warrants a colonoscopy to clarify etiology of her bleeding symptoms, which sound chronic, and she has no anemia, Hgb is in the 15s which is reassuring. We will reach out to her prescribing physician of Eliquis to see when she would be able to hold the Eliquis for a brief period of time to do a colonoscopy (in case polypectomy is needed). If she can't hold it for a few months yet, and bleeding worsens or symptoms change, we can do a diagnostic colonoscopy on Eliquis to clarify etiology if needed. If she can't hold Eliquis until April and symptoms mild / stable, will wait until then to do this exam or do the exam sooner on the Eliquis if needed."  Can you please comment on whether to hold Eliquis briefly or continue it and pursue diagnostic colonoscopy.   Please route response back to P CV DIV PREOP  Thanks, Gae Bon

## 2019-11-09 NOTE — Progress Notes (Signed)
Agree with assessment and plan as outlined.  

## 2019-11-09 NOTE — Telephone Encounter (Signed)
2 days is OK

## 2019-11-09 NOTE — Telephone Encounter (Signed)
Her DVT is distal, so risk of embolization is low.  OK to hold Eliquis for the procedure

## 2019-11-12 ENCOUNTER — Other Ambulatory Visit: Payer: Self-pay

## 2019-11-12 ENCOUNTER — Ambulatory Visit (INDEPENDENT_AMBULATORY_CARE_PROVIDER_SITE_OTHER): Payer: Medicaid Other | Admitting: Psychiatry

## 2019-11-12 DIAGNOSIS — F331 Major depressive disorder, recurrent, moderate: Secondary | ICD-10-CM | POA: Diagnosis not present

## 2019-11-12 DIAGNOSIS — F4310 Post-traumatic stress disorder, unspecified: Secondary | ICD-10-CM | POA: Diagnosis not present
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
## 2019-11-16 ENCOUNTER — Other Ambulatory Visit: Payer: Self-pay

## 2019-11-16 ENCOUNTER — Ambulatory Visit (HOSPITAL_COMMUNITY)
Admission: RE | Admit: 2019-11-16 | Discharge: 2019-11-16 | Disposition: A | Discharge status: Home / Self Care | Payer: Medicaid Other | Source: Ambulatory Visit | Attending: Physician Assistant | Admitting: Physician Assistant

## 2019-11-16 DIAGNOSIS — K625 Hemorrhage of anus and rectum: Secondary | ICD-10-CM

## 2019-11-16 DIAGNOSIS — K219 Gastro-esophageal reflux disease without esophagitis: Secondary | ICD-10-CM | POA: Diagnosis present

## 2019-11-16 DIAGNOSIS — R112 Nausea with vomiting, unspecified: Secondary | ICD-10-CM | POA: Insufficient documentation

## 2019-11-16 DIAGNOSIS — K649 Unspecified hemorrhoids: Secondary | ICD-10-CM | POA: Diagnosis present

## 2019-11-16 DIAGNOSIS — R6881 Early satiety: Secondary | ICD-10-CM | POA: Diagnosis present

## 2019-11-16 DIAGNOSIS — R109 Unspecified abdominal pain: Secondary | ICD-10-CM | POA: Insufficient documentation

## 2019-11-16 MED ORDER — TECHNETIUM TC 99M SULFUR COLLOID
2.0000 | Freq: Once | INTRAVENOUS | Status: AC | PRN
  Administered 2019-11-16: 2 via INTRAVENOUS

## 2019-11-19 ENCOUNTER — Ambulatory Visit: Payer: Medicaid Other | Admitting: Family Medicine

## 2019-11-20 ENCOUNTER — Other Ambulatory Visit: Payer: Self-pay

## 2019-11-20 ENCOUNTER — Encounter (HOSPITAL_COMMUNITY): Payer: Self-pay | Admitting: Psychiatry

## 2019-11-20 ENCOUNTER — Ambulatory Visit (INDEPENDENT_AMBULATORY_CARE_PROVIDER_SITE_OTHER): Payer: Medicaid Other | Admitting: Psychiatry

## 2019-11-20 ENCOUNTER — Telehealth: Payer: Self-pay | Admitting: *Deleted

## 2019-11-20 DIAGNOSIS — F4310 Post-traumatic stress disorder, unspecified: Secondary | ICD-10-CM

## 2019-11-20 DIAGNOSIS — F331 Major depressive disorder, recurrent, moderate: Secondary | ICD-10-CM | POA: Diagnosis not present

## 2019-11-20 DIAGNOSIS — E1165 Type 2 diabetes mellitus with hyperglycemia: Secondary | ICD-10-CM

## 2019-11-20 MED ORDER — GLUCOSE BLOOD VI STRP: ORAL_STRIP | 12 refills | Status: AC

## 2019-11-20 MED ORDER — ARIPIPRAZOLE 5 MG PO TABS: 5.0000 mg | ORAL_TABLET | Freq: Every day | ORAL | 1 refills | Status: DC

## 2019-11-20 NOTE — Telephone Encounter (Signed)
ACCU-CHEK guide test strips are not covered by patients insurance.  NEW order sent for RX for generic test strips ( in note: "what is covered by insurance")   Test QID / E11.65

## 2019-11-20 NOTE — Patient Instructions (Signed)
1.Continuefluoxetine 40 mg daily  2. Increase Abilify 5 mg daily 3. Next appointment: 4/28 at 10:30

## 2019-11-21 ENCOUNTER — Ambulatory Visit: Payer: Medicaid Other | Admitting: Podiatry

## 2019-11-21 ENCOUNTER — Other Ambulatory Visit: Payer: Self-pay

## 2019-11-21 DIAGNOSIS — M722 Plantar fascial fibromatosis: Secondary | ICD-10-CM

## 2019-11-21 DIAGNOSIS — I739 Peripheral vascular disease, unspecified: Secondary | ICD-10-CM

## 2019-11-23 ENCOUNTER — Ambulatory Visit (INDEPENDENT_AMBULATORY_CARE_PROVIDER_SITE_OTHER): Payer: Medicaid Other | Admitting: Family Medicine

## 2019-11-23 ENCOUNTER — Encounter: Payer: Self-pay | Admitting: Family Medicine

## 2019-11-23 ENCOUNTER — Other Ambulatory Visit: Payer: Self-pay

## 2019-11-23 VITALS — BP 121/78 | HR 79 | Temp 97.9°F | Ht 61.0 in | Wt 222.5 lb

## 2019-11-23 DIAGNOSIS — E785 Hyperlipidemia, unspecified: Secondary | ICD-10-CM

## 2019-11-23 DIAGNOSIS — I10 Essential (primary) hypertension: Secondary | ICD-10-CM

## 2019-11-23 DIAGNOSIS — E1165 Type 2 diabetes mellitus with hyperglycemia: Secondary | ICD-10-CM

## 2019-11-23 DIAGNOSIS — B370 Candidal stomatitis: Secondary | ICD-10-CM

## 2019-11-23 DIAGNOSIS — Z6841 Body Mass Index (BMI) 40.0 and over, adult: Secondary | ICD-10-CM

## 2019-11-23 DIAGNOSIS — E1169 Type 2 diabetes mellitus with other specified complication: Secondary | ICD-10-CM | POA: Diagnosis not present

## 2019-11-23 DIAGNOSIS — Z23 Encounter for immunization: Secondary | ICD-10-CM

## 2019-11-23 DIAGNOSIS — Z794 Long term (current) use of insulin: Secondary | ICD-10-CM

## 2019-11-23 DIAGNOSIS — K219 Gastro-esophageal reflux disease without esophagitis: Secondary | ICD-10-CM

## 2019-11-23 DIAGNOSIS — E1159 Type 2 diabetes mellitus with other circulatory complications: Secondary | ICD-10-CM | POA: Diagnosis not present

## 2019-11-23 LAB — BAYER DCA HB A1C WAIVED: HB A1C (BAYER DCA - WAIVED): 11.2 % — ABNORMAL HIGH (ref <7.0)

## 2019-11-23 MED ORDER — VICTOZA 18 MG/3ML ~~LOC~~ SOPN: 3.0000 mg | PEN_INJECTOR | Freq: Every day | SUBCUTANEOUS | 6 refills | Status: DC

## 2019-11-23 MED ORDER — INSULIN DETEMIR 100 UNIT/ML ~~LOC~~ SOLN: SUBCUTANEOUS | 4 refills | Status: DC

## 2019-11-23 MED ORDER — NYSTATIN 100000 UNIT/ML MT SUSP: 5.0000 mL | Freq: Four times a day (QID) | OROMUCOSAL | 1 refills | Status: AC

## 2019-11-23 NOTE — Progress Notes (Addendum)
Subjective:  Patient ID: Lisa Norton, female    DOB: June 08, 1971, 49 y.o.   MRN: [id number]  Patient Care Team: Baruch Gouty, FNP as PCP - General (Family Medicine) Donato Heinz, MD as PCP - Cardiology (Cardiology) Michael Boston, MD as Consulting Physician (General Surgery) Marti Sleigh, MD as Consulting Physician (Gynecology) Madine Sarr, Connye Burkitt, FNP (Family Medicine)   Chief Complaint:  Tongue is sore and Medical Management of Chronic Issues   HPI: Lisa Norton is a 49 y.o. female presenting on 11/23/2019 for Tongue is sore and Medical Management of Chronic Issues   1. Type 2 diabetes mellitus with hyperglycemia, with long-term current use of insulin ([place]) States her blood sugars have been running in the 200s. States she has not been watching her diet as she should. States she does not have polyuria, polydipsia, or polyphagia. States she does have a beefy red and sore tongue. States this started about 2 days ago and is not gettine better.  She does have a follow up with a weight loss clinic and is going to discuss medications that may help with her weight.   2. Hypertension associated with type 2 diabetes mellitus ([place]) Reports her blood pressure is well controlled. She is compliant with medications but not diet and exercise. She denies chest pain, shortness of breath, leg swelling, headaches, weakness, or confusion.   3. Hyperlipidemia associated with type 2 diabetes mellitus ([place]) Compliant with medications but not diet and exercise. No increased myalgias or arthralgias.   4. Gastroesophageal reflux disease without esophagitis Doing well with current regimen. No hemoptysis, hematemesis, sore throat, cough, trouble swallowing, voice changes, weight loss, fever, fatigue, melena, or hematochezia.      Relevant past medical, surgical, family, and social history reviewed and updated as indicated.  Allergies and medications reviewed and updated. Date reviewed:  Chart in Epic.   Past Medical History:  Diagnosis Date  . Anxiety   . COLD SORE 05/15/2010  . DEPRESSION 12/12/2009  . Diabetes mellitus without complication ([place])   . High cholesterol   . 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 HYPERTENSION 12/12/2009  . TOBACCO ABUSE 12/12/2009    Past Surgical History:  Procedure Laterality Date  . BLADDER SUSPENSION    . CERVICAL FUSION    . CESAREAN SECTION WITH BILATERAL TUBAL LIGATION Bilateral 02/12/2015   Procedure: CESAREAN SECTION;  Surgeon: Florian Buff, MD;  Location: [facility];  Service: Obstetrics;  Laterality: Bilateral;  Fetal Demise  . CHOLECYSTECTOMY  2006  . EVALUATION UNDER ANESTHESIA WITH HEMORRHOIDECTOMY N/A 10/12/2018   Procedure: LATERAL INTERNAL HEMORRHOID LIGATION/PEXY ANORECTAL EXAM UNDER ANESTHESIA WITH HEMORRHOIDECTOMY X2;  Surgeon: Michael Boston, MD;  Location: [facility];  Service: General;  Laterality: N/A;  . FOOT SURGERY Right    Plantar, bone spurs  . FOOT SURGERY Bilateral 2019 and 2020   11/2018, 05/31/2019  . LAPAROSCOPIC VAGINAL HYSTERECTOMY WITH SALPINGECTOMY  02/16/2018   [facility]  . TONSILLECTOMY  1988    Social History   Socioeconomic History  . Marital status: Legally Separated    Spouse name: Not on file  . Number of children: 2  . Years of education: Not on file  . Highest education level: Not on file  Occupational History  . Occupation: unemployed  Tobacco Use  . Smoking status: Current Every Day Smoker    Packs/day: 1.00    Years: 20.00    Pack years: 20.00    Types: Cigarettes  . Smokeless  tobacco: Former Systems developer    Types: Snuff    Quit date: 05/05/1986  Substance and Sexual Activity  . Alcohol use: Not Currently    Comment: had a bout of heavy drinking 2015, drinks occasionally now   . Drug use: Not Currently    Comment: past use of marijuana about every other day for two years, last used 25 years ago.   Marland Kitchen Sexual activity: Yes    Birth control/protection: None, Surgical     Comment: pt states she did not have a tubal  Other Topics Concern  . Not on file  Social History Narrative  . Not on file   Social Determinants of Health   Financial Resource Strain:   . Difficulty of Paying Living Expenses:   Food Insecurity:   . Worried About Charity fundraiser in the Last Year:   . Arboriculturist in the Last Year:   Transportation Needs:   . Film/video editor (Medical):   Marland Kitchen Lack of Transportation (Non-Medical):   Physical Activity:   . Days of Exercise per Week:   . Minutes of Exercise per Session:   Stress:   . Feeling of Stress :   Social Connections:   . Frequency of Communication with Friends and Family:   . Frequency of Social Gatherings with Friends and Family:   . Attends Religious Services:   . Active Member of Clubs or Organizations:   . Attends Archivist Meetings:   Marland Kitchen Marital Status:   Intimate Partner Violence:   . Fear of Current or Ex-Partner:   . Emotionally Abused:   Marland Kitchen Physically Abused:   . Sexually Abused:     Outpatient Encounter Medications as of 11/23/2019  Medication Sig  . Accu-Chek FastClix Lancets MISC 4 TIMES DAILY  . apixaban (ELIQUIS) 5 MG TABS tablet Take 2 tablets ( 10 mg ) twice a day for 7 days then take 1 tablet ( 5 mg ) twice a day  . ARIPiprazole (ABILIFY) 5 MG tablet Take 1 tablet (5 mg total) by mouth daily.  . dapagliflozin propanediol (FARXIGA) 10 MG TABS tablet Take 10 mg by mouth daily.  Marland Kitchen EPINEPHrine (EPIPEN 2-PAK) 0.3 mg/0.3 mL IJ SOAJ injection Inject 0.3 mLs (0.3 mg total) into the muscle as needed for anaphylaxis.  Marland Kitchen FLUoxetine (PROZAC) 40 MG capsule Take 1 capsule (40 mg total) by mouth daily.  . fluticasone (FLONASE) 50 MCG/ACT nasal spray Place 2 sprays into both nostrils daily.  Marland Kitchen gabapentin (NEURONTIN) 300 MG capsule Take 1 capsule (300 mg total) by mouth 3 (three) times daily. Increase to 4x/day as needed  . glucose blood test strip Use to test BS QID and PRN dx.E11.65  . hydrOXYzine  (VISTARIL) 50 MG capsule TAKE 2 CAPSULES 3 TIMES DAILY AS NEEDED  . insulin detemir (LEVEMIR) 100 UNIT/ML injection INJECT 0.3ML (30 UNITS TOTAL) SQ AT BEDTIME  . levocetirizine (XYZAL) 5 MG tablet Take 1 tablet (5 mg total) by mouth every evening.  . liraglutide (VICTOZA) 18 MG/3ML SOPN Inject 0.5 mLs (3 mg total) into the skin daily.  . magnesium hydroxide (MILK OF MAGNESIA) 400 MG/5ML suspension Take 5-10 mLs by mouth daily as needed for mild constipation.  . methylPREDNISolone (MEDROL DOSEPAK) 4 MG TBPK tablet 6 day dose pack - take as directed  . oxyCODONE-acetaminophen (PERCOCET) 5-325 MG tablet Take 1 tablet by mouth every 6 (six) hours as needed for severe pain.  . pantoprazole (PROTONIX) 40 MG tablet Take 1 tablet (40  mg total) by mouth daily.  . pravastatin (PRAVACHOL) 80 MG tablet TAKE ONE (1) TABLET EACH DAY  . ULTICARE MICRO PEN NEEDLES 32G X 4 MM MISC 3 TIMES DAILY  . valACYclovir (VALTREX) 1000 MG tablet Take 1 tablet (1,000 mg total) by mouth 2 (two) times daily.  . [DISCONTINUED] ARIPiprazole (ABILIFY) 2 MG tablet Take 1 tablet (2 mg total) by mouth daily.  . [DISCONTINUED] fluconazole (DIFLUCAN) 150 MG tablet 1 po q week x 4 weeks  . [DISCONTINUED] insulin detemir (LEVEMIR) 100 UNIT/ML injection INJECT 0.3ML (30 UNITS TOTAL) SQ AT BEDTIME (Patient taking differently: 20 Units daily. INJECT 0.3ML (30 UNITS TOTAL) SQ AT BEDTIME)  . [DISCONTINUED] liraglutide (VICTOZA) 18 MG/3ML SOPN Inject 0.3 mLs (1.8 mg total) into the skin daily.  . cetirizine (ZYRTEC) 10 MG tablet Take 1 tablet (10 mg total) by mouth daily. (Patient not taking: Reported on 11/23/2019)  . clobetasol cream (TEMOVATE) 0.05 % APPLY AT BEDTIME (Patient not taking: Reported on 11/23/2019)  . mupirocin ointment (BACTROBAN) 2 % Apply 1 application topically 2 (two) times daily. (Patient not taking: Reported on 11/23/2019)  . Na Sulfate-K Sulfate-Mg Sulf (SUPREP BOWEL PREP KIT) 17.5-3.13-1.6 GM/177ML SOLN Take 1 kit by  mouth as directed. For colonoscopy prep (Patient not taking: Reported on 11/23/2019)  . nystatin (MYCOSTATIN) 100000 UNIT/ML suspension Take 5 mLs (500,000 Units total) by mouth 4 (four) times daily for 7 days.  . ondansetron (ZOFRAN) 4 MG tablet Take 1 tablet (4 mg total) by mouth every 6 (six) hours as needed for nausea or vomiting. (Patient not taking: Reported on 11/23/2019)  . Oxycodone HCl 10 MG TABS Take 10 mg by mouth 4 (four) times daily as needed.  . [DISCONTINUED] clotrimazole (CLOTRIMAZOLE ATHLETES FOOT) 1 % cream Apply 1 application topically 2 (two) times daily.  . [DISCONTINUED] famotidine (PEPCID) 20 MG tablet Take 1 tablet (20 mg total) by mouth 2 (two) times daily.   No facility-administered encounter medications on file as of 11/23/2019.    Allergies  Allergen Reactions  . Metformin And Related Diarrhea    Review of Systems  Constitutional: Negative for activity change, appetite change, chills, diaphoresis, fatigue, fever and unexpected weight change.  HENT: Positive for mouth sores. Negative for congestion, postnasal drip, rhinorrhea, sore throat, trouble swallowing and voice change.   Eyes: Negative.  Negative for photophobia and visual disturbance.  Respiratory: Negative for cough, choking, chest tightness and shortness of breath.   Cardiovascular: Negative for chest pain, palpitations and leg swelling.  Gastrointestinal: Negative for abdominal pain, blood in stool, constipation, diarrhea, nausea and vomiting.  Endocrine: Negative.  Negative for cold intolerance, heat intolerance, polydipsia, polyphagia and polyuria.  Genitourinary: Negative for decreased urine volume, difficulty urinating, dysuria, frequency and urgency.  Musculoskeletal: Positive for arthralgias (chronic, no new or worsening symptoms) and myalgias (chronic).  Skin: Negative.   Allergic/Immunologic: Negative.   Neurological: Negative for dizziness, tremors, seizures, syncope, facial asymmetry, speech  difficulty, weakness, light-headedness, numbness and headaches.  Hematological: Negative.   Psychiatric/Behavioral: Negative for confusion, hallucinations, sleep disturbance and suicidal ideas.  All other systems reviewed and are negative.       Objective:  BP 121/78   Pulse 79   Temp 97.9 F (36.6 C) (Oral)   Ht '5\' 1"'  (1.549 m)   Wt 222 lb 8 oz (100.9 kg)   LMP 09/07/2017   SpO2 98%   BMI 42.04 kg/m    Wt Readings from Last 3 Encounters:  11/23/19 222 lb 8 oz (100.9  kg)  11/08/19 215 lb (97.5 kg)  10/22/19 214 lb (97.1 kg)    Physical Exam Vitals and nursing note reviewed.  Constitutional:      General: She is not in acute distress.    Appearance: Normal appearance. She is well-developed and well-groomed. She is morbidly obese. She is not ill-appearing, toxic-appearing or diaphoretic.  HENT:     Head: Normocephalic and atraumatic.     Jaw: There is normal jaw occlusion.     Right Ear: Hearing normal.     Left Ear: Hearing normal.     Nose: Nose normal.     Mouth/Throat:     Lips: Pink.     Mouth: Mucous membranes are moist.     Pharynx: Oropharynx is clear. Uvula midline.  Eyes:     General: Lids are normal.     Extraocular Movements: Extraocular movements intact.     Conjunctiva/sclera: Conjunctivae normal.     Pupils: Pupils are equal, round, and reactive to light.  Neck:     Thyroid: No thyroid mass, thyromegaly or thyroid tenderness.     Vascular: No carotid bruit or JVD.     Trachea: Trachea and phonation normal.  Cardiovascular:     Rate and Rhythm: Normal rate and regular rhythm.     Chest Wall: PMI is not displaced.     Pulses: Normal pulses.     Heart sounds: Normal heart sounds. No murmur. No friction rub. No gallop.   Pulmonary:     Effort: Pulmonary effort is normal. No respiratory distress.     Breath sounds: Normal breath sounds. No wheezing.  Abdominal:     General: Bowel sounds are normal. There is no distension or abdominal bruit.      Palpations: Abdomen is soft. There is no hepatomegaly or splenomegaly.     Tenderness: There is no abdominal tenderness. There is no right CVA tenderness or left CVA tenderness.     Hernia: No hernia is present.  Musculoskeletal:     Cervical back: Normal, normal range of motion and neck supple.     Thoracic back: Tenderness present. Decreased range of motion.     Lumbar back: Tenderness present. Decreased range of motion.     Right hip: Normal.     Left hip: Normal.     Right lower leg: No edema.     Left lower leg: No edema.  Lymphadenopathy:     Cervical: No cervical adenopathy.  Skin:    General: Skin is warm and dry.     Capillary Refill: Capillary refill takes less than 2 seconds.     Coloration: Skin is not cyanotic, jaundiced or pale.     Findings: No rash.  Neurological:     General: No focal deficit present.     Mental Status: She is alert and oriented to person, place, and time.     Cranial Nerves: Cranial nerves are intact. No cranial nerve deficit.     Sensory: Sensation is intact. No sensory deficit.     Motor: Motor function is intact. No weakness.     Coordination: Coordination is intact. Coordination normal.     Gait: Gait is intact. Gait normal.     Deep Tendon Reflexes: Reflexes are normal and symmetric. Reflexes normal.  Psychiatric:        Attention and Perception: Attention and perception normal.        Mood and Affect: Mood and affect normal.        Speech: Speech  normal.        Behavior: Behavior normal. Behavior is cooperative.        Thought Content: Thought content normal.        Cognition and Memory: Cognition and memory normal.        Judgment: Judgment normal.     Results for orders placed or performed in visit on 10/22/19  WET PREP FOR [place], YEAST, CLUE   Specimen: Vaginal Fluid   VAGINAL FLUI  Result Value Ref Range   Trichomonas Exam Negative Negative   Yeast Exam Negative Negative   Clue Cell Exam Negative Negative  Microscopic  Examination   URINE  Result Value Ref Range   WBC, UA 0-5 0 - 5 /hpf   RBC 0-2 0 - 2 /hpf   Epithelial Cells (non renal) >10 (A) 0 - 10 /hpf   Renal Epithel, UA None seen None seen /hpf   Bacteria, UA Few None seen/Few  Urinalysis, Complete  Result Value Ref Range   Specific Gravity, UA 1.020 1.005 - 1.030   pH, UA 6.0 5.0 - 7.5   Color, UA Yellow Yellow   Appearance Ur Clear Clear   Leukocytes,UA Negative Negative   Protein,UA Negative Negative/Trace   Glucose, UA 3+ (A) Negative   Ketones, UA Negative Negative   RBC, UA Trace (A) Negative   Bilirubin, UA Negative Negative   Urobilinogen, Ur 0.2 0.2 - 1.0 mg/dL   Nitrite, UA Negative Negative   Microscopic Examination See below:        Pertinent labs & imaging results that were available during my care of the patient were reviewed by me and considered in my medical decision making.  Assessment & Plan:  Kayron was seen today for tongue is sore and medical management of chronic issues.  Diagnoses and all orders for this visit:  Type 2 diabetes mellitus with hyperglycemia, with long-term current use of insulin (H[facility]) A1C 11.2. Pt has only been taking Levemir 20 units, will increase to 30 units. Will increase Victoza to 3 mg for dual benefit - DM and morbid obesity. Pt aware of dietary and lifestyle changes that must be made. Aware if A1C remains above 10 at next, will refer to endocrinology. Pt has upcoming appointment with nutritionist.  -     CBC with Differential/Platelet -     Bayer DCA Hb A1c Waived -     liraglutide (VICTOZA) 18 MG/3ML SOPN; Inject 0.5 mLs (3 mg total) into the skin daily. -     insulin detemir (LEVEMIR) 100 UNIT/ML injection; INJECT 0.3ML (30 UNITS TOTAL) SQ AT BEDTIME -     CMP14+EGFR  Class 3 severe obesity with serious comorbidity and body mass index (BMI) of 45.0 to 49.9 in adult, unspecified obesity type (HCC) Will increase Victoza to 3 mg daily for weight management and DM control.  -      liraglutide (VICTOZA) 18 MG/3ML SOPN; Inject 0.5 mLs (3 mg total) into the skin daily. -     CMP14+EGFR -     TSH  Hypertension associated with type 2 diabetes mellitus (HCC) BP well controlled. Changes were not made in regimen today. Goal BP is 130/80. Pt aware to report any persistent high or low readings. DASH diet and exercise encouraged. Exercise at least 150 minutes per week and increase as tolerated. Goal BMI > 25. Stress management encouraged. Avoid nicotine and tobacco product use. Avoid excessive alcohol and NSAID's. Avoid more than 2000 mg of sodium daily. Medications as prescribed. Follow  up as scheduled.  -     CBC with Differential/Platelet -     CMP14+EGFR -     TSH  Hyperlipidemia associated with type 2 diabetes mellitus ([place]) Diet encouraged - increase intake of fresh fruits and vegetables, increase intake of lean proteins. Bake, broil, or grill foods. Avoid fried, greasy, and fatty foods. Avoid fast foods. Increase intake of fiber-rich whole grains. Exercise encouraged - at least 150 minutes per week and advance as tolerated.  Goal BMI < 25. Continue medications as prescribed. Follow up in 3-6 months as discussed.  -     CBC with Differential/Platelet -     Lipid panel -     CMP14+EGFR  Gastroesophageal reflux disease without esophagitis No red flags present. Diet discussed. Avoid fried, spicy, fatty, greasy, and acidic foods. Avoid caffeine, nicotine, and alcohol. Do not eat 2-3 hours before bedtime and stay upright for at least 1-2 hours after eating. Eat small frequent meals. Avoid NSAID's like motrin and aleve. Medications as prescribed. Report any new or worsening symptoms. Follow up as discussed or sooner if needed.   -     CBC with Differential/Platelet  Oral thrush Symptomatic care discussed in detail. Medications as prescribed.  -     nystatin (MYCOSTATIN) 100000 UNIT/ML suspension; Take 5 mLs (500,000 Units total) by mouth 4 (four) times daily for 7 days.    PNA  vaccine given today.   Continue all other maintenance medications.  Follow up plan: Return in about 3 months (around 02/23/2020), or if symptoms worsen or fail to improve, for DM.   Medical decision-making:   40 minutes spent today reviewing the medical chart, counseling the patient/family, and documenting today's visit.  Continue healthy lifestyle choices, including diet (rich in fruits, vegetables, and lean proteins, and low in salt and simple carbohydrates) and exercise (at least 30 minutes of moderate physical activity daily).  Educational handout given for DM  The above assessment and management plan was discussed with the patient. The patient verbalized understanding of and has agreed to the management plan. Patient is aware to call the clinic if they develop any new symptoms or if symptoms persist or worsen. Patient is aware when to return to the clinic for a follow-up visit. Patient educated on when it is appropriate to go to the emergency department.   Monia Pouch, FNP-C [facility] [phone]

## 2019-11-23 NOTE — Addendum Note (Signed)
Addended by: Michaela Corner on: 11/23/2019 03:59 PM   Modules accepted: Orders

## 2019-11-23 NOTE — Patient Instructions (Signed)

## 2019-11-24 LAB — CMP14+EGFR
ALT: 16 IU/L (ref 0–32)
AST: 17 IU/L (ref 0–40)
Albumin/Globulin Ratio: 1.6 (ref 1.2–2.2)
Albumin: 3.7 g/dL — ABNORMAL LOW (ref 3.8–4.8)
Alkaline Phosphatase: 137 IU/L — ABNORMAL HIGH (ref 39–117)
BUN/Creatinine Ratio: 17 (ref 9–23)
BUN: 14 mg/dL (ref 6–24)
Bilirubin Total: <0.2 mg/dL (ref 0.0–1.2)
CO2: 22 mmol/L (ref 20–29)
Calcium: 8.9 mg/dL (ref 8.7–10.2)
Chloride: 101 mmol/L (ref 96–106)
Creatinine, Ser: 0.83 mg/dL (ref 0.57–1.00)
GFR calc Af Amer: 97 mL/min/{1.73_m2} (ref >59)
GFR calc non Af Amer: 84 mL/min/{1.73_m2} (ref >59)
Globulin, Total: 2.3 g/dL (ref 1.5–4.5)
Glucose: 297 mg/dL — ABNORMAL HIGH (ref 65–99)
Potassium: 3.9 mmol/L (ref 3.5–5.2)
Sodium: 138 mmol/L (ref 134–144)
Total Protein: 6 g/dL (ref 6.0–8.5)

## 2019-11-24 LAB — CBC WITH DIFFERENTIAL/PLATELET
Basophils Absolute: 0.1 10*3/uL (ref 0.0–0.2)
Basos: 1 %
EOS (ABSOLUTE): 0.1 10*3/uL (ref 0.0–0.4)
Eos: 2 %
Hematocrit: 47.2 % — ABNORMAL HIGH (ref 34.0–46.6)
Hemoglobin: 16.1 g/dL — ABNORMAL HIGH (ref 11.1–15.9)
Immature Grans (Abs): 0 10*3/uL (ref 0.0–0.1)
Immature Granulocytes: 0 %
Lymphocytes Absolute: 2.2 10*3/uL (ref 0.7–3.1)
Lymphs: 31 %
MCH: 33.6 pg — ABNORMAL HIGH (ref 26.6–33.0)
MCHC: 34.1 g/dL (ref 31.5–35.7)
MCV: 99 fL — ABNORMAL HIGH (ref 79–97)
Monocytes Absolute: 0.5 10*3/uL (ref 0.1–0.9)
Monocytes: 7 %
Neutrophils Absolute: 4.3 10*3/uL (ref 1.4–7.0)
Neutrophils: 59 %
Platelets: 175 10*3/uL (ref 150–450)
RBC: 4.79 x10E6/uL (ref 3.77–5.28)
RDW: 12.1 % (ref 11.7–15.4)
WBC: 7.1 10*3/uL (ref 3.4–10.8)

## 2019-11-24 LAB — LIPID PANEL
Chol/HDL Ratio: 12.3 ratio — ABNORMAL HIGH (ref 0.0–4.4)
Cholesterol, Total: 196 mg/dL (ref 100–199)
HDL: 16 mg/dL — ABNORMAL LOW (ref >39)
Triglycerides: 857 mg/dL (ref 0–149)

## 2019-11-24 LAB — TSH: TSH: 2.43 u[IU]/mL (ref 0.450–4.500)

## 2019-11-25 MED ORDER — FENOFIBRATE 145 MG PO TABS: 145.0000 mg | ORAL_TABLET | Freq: Every day | ORAL | 1 refills | Status: DC

## 2019-11-25 NOTE — Progress Notes (Signed)
   Subjective:  Patient presents today status post posterior and inferior heel spur resection right DOS: 12/07/2018 and status post posterior and inferior heel spur resection left DOS: 05/31/2019. She is here for follow up evaluation of bilateral plantar fasciitis as well. She reports there has been no improvement in her symptoms. She states the left foot hurts worse than the right. She states the Medrol Dose Pak and the injections provided no relief of the pain. She has not been doing physical therapy as directed. She is scheduled to follow up with Dr. Scot Dock in Vascular in 6 months (September). Patient is here for further evaluation and treatment.   Past Medical History:  Diagnosis Date  . Anxiety   . COLD SORE 05/15/2010  . DEPRESSION 12/12/2009  . Diabetes mellitus without complication ([place])   . High cholesterol   . 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 HYPERTENSION 12/12/2009  . TOBACCO ABUSE 12/12/2009      Objective/Physical Exam Neurovascular status intact.  Skin incisions appear to be well coapted. No sign of infectious process noted. No dehiscence. No active bleeding noted. Moderate edema noted to the surgical extremity. DP pulses palpable bilaterally. PT pulses diminished bilaterally.  Pain with palpation noted to the bilateral heels.   Assessment: 1. s/p posterior and inferior heel spur resection left. DOS: 05/31/2019 2. s/p posterior and inferior heel spur resection right. DOS: 12/07/2018 3. DVT LLE 4. Inflammatory plantar fasciitis bilateral   5. PVD BLE   Plan of Care:  1. Patient was evaluated.  2. Continue pain management.  3. Recommended good shoe gear.  4. Continue management with vascular, Dr. Scot Dock.  5. At this time, I believe patient's pain is coming from vascular compromise.   6. Return to clinic as needed.    Edrick Kins, DPM [facility]  Dr. Edrick Kins, [address]                                        [place], [place] [address]                 Office [phone]  Fax [phone]

## 2019-11-25 NOTE — Addendum Note (Signed)
Addended by: Baruch Gouty on: 11/25/2019 01:31 PM   Modules accepted: Orders

## 2019-11-26 NOTE — Telephone Encounter (Signed)
Pt informed okay to hold Eliquis x2 days prior to procedure. Pt voiced understanding.

## 2019-12-04 ENCOUNTER — Ambulatory Visit: Payer: Medicaid Other | Attending: Podiatry | Admitting: Physical Therapy
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
## 2019-12-12 ENCOUNTER — Ambulatory Visit (INDEPENDENT_AMBULATORY_CARE_PROVIDER_SITE_OTHER): Payer: Medicaid Other

## 2019-12-12 ENCOUNTER — Other Ambulatory Visit: Payer: Self-pay

## 2019-12-12 ENCOUNTER — Encounter: Payer: Self-pay | Admitting: Cardiology

## 2019-12-12 ENCOUNTER — Other Ambulatory Visit: Payer: Self-pay | Admitting: Gastroenterology

## 2019-12-12 ENCOUNTER — Ambulatory Visit: Payer: Medicaid Other | Admitting: Cardiology

## 2019-12-12 VITALS — BP 130/74 | HR 90 | Temp 97.0°F | Ht 61.0 in | Wt 214.2 lb

## 2019-12-12 DIAGNOSIS — E785 Hyperlipidemia, unspecified: Secondary | ICD-10-CM

## 2019-12-12 DIAGNOSIS — I82452 Acute embolism and thrombosis of left peroneal vein: Secondary | ICD-10-CM | POA: Diagnosis not present

## 2019-12-12 DIAGNOSIS — Z6841 Body Mass Index (BMI) 40.0 and over, adult: Secondary | ICD-10-CM

## 2019-12-12 DIAGNOSIS — R0602 Shortness of breath: Secondary | ICD-10-CM

## 2019-12-12 DIAGNOSIS — I739 Peripheral vascular disease, unspecified: Secondary | ICD-10-CM

## 2019-12-12 DIAGNOSIS — Z1159 Encounter for screening for other viral diseases: Secondary | ICD-10-CM

## 2019-12-12 MED ORDER — ASPIRIN EC 81 MG PO TBEC: 81.0000 mg | DELAYED_RELEASE_TABLET | Freq: Every day | ORAL | 3 refills | Status: AC

## 2019-12-12 NOTE — Patient Instructions (Signed)
Medication Instructions:  STOP Eliquis After colonoscopy-START Aspirin 81 mg daily  *If you need a refill on your cardiac medications before your next appointment, please call your pharmacy*  Testing/Procedures: Your physician has requested that you have a lower extremity venous duplex. This test is an ultrasound of the veins in the legs. It looks at venous blood flow that carries blood from the heart to the legs. Allow one hour for a Lower Venous exam. There are no restrictions or special instructions.  Your physician has requested that you have an echocardiogram. Echocardiography is a painless test that uses sound waves to create images of your heart. It provides your doctor with information about the size and shape of your heart and how well your heart's chambers and valves are working. This procedure takes approximately one hour. There are no restrictions for this procedure. This will be done at our [facility] location:  [place]: At [facility], you and your health needs are our priority.  As part of our continuing mission to provide you with exceptional heart care, we have created designated Provider Care Teams.  These Care Teams include your primary Cardiologist (physician) and Advanced Practice Providers (APPs -  Physician Assistants and Nurse Practitioners) who all work together to provide you with the care you need, when you need it.  We recommend signing up for the patient portal called "MyChart".  Sign up information is provided on this After Visit Summary.  MyChart is used to connect with patients for Virtual Visits (Telemedicine).  Patients are able to view lab/test results, encounter notes, upcoming appointments, etc.  Non-urgent messages can be sent to your provider as well.   To learn more about what you can do with MyChart, go to [email or web address].    Your next appointment:   1 month(s)  The format for your next appointment:   In  Person  Provider:   Oswaldo Milian, MD

## 2019-12-13 LAB — SARS CORONAVIRUS 2 (TAT 6-24 HRS): SARS Coronavirus 2: NEGATIVE

## 2019-12-14 ENCOUNTER — Other Ambulatory Visit: Payer: Self-pay

## 2019-12-14 ENCOUNTER — Ambulatory Visit (AMBULATORY_SURGERY_CENTER): Payer: Medicaid Other | Admitting: Gastroenterology

## 2019-12-14 ENCOUNTER — Encounter: Payer: Self-pay | Admitting: Gastroenterology

## 2019-12-14 VITALS — BP 96/66 | HR 74 | Temp 98.2°F | Resp 16 | Ht 61.0 in | Wt 214.0 lb

## 2019-12-14 DIAGNOSIS — K648 Other hemorrhoids: Secondary | ICD-10-CM

## 2019-12-14 DIAGNOSIS — K625 Hemorrhage of anus and rectum: Secondary | ICD-10-CM

## 2019-12-14 DIAGNOSIS — K6289 Other specified diseases of anus and rectum: Secondary | ICD-10-CM

## 2019-12-14 DIAGNOSIS — K3189 Other diseases of stomach and duodenum: Secondary | ICD-10-CM | POA: Diagnosis not present

## 2019-12-14 DIAGNOSIS — K635 Polyp of colon: Secondary | ICD-10-CM | POA: Diagnosis not present

## 2019-12-14 DIAGNOSIS — K317 Polyp of stomach and duodenum: Secondary | ICD-10-CM

## 2019-12-14 DIAGNOSIS — K2950 Unspecified chronic gastritis without bleeding: Secondary | ICD-10-CM

## 2019-12-14 DIAGNOSIS — K5730 Diverticulosis of large intestine without perforation or abscess without bleeding: Secondary | ICD-10-CM

## 2019-12-14 DIAGNOSIS — K449 Diaphragmatic hernia without obstruction or gangrene: Secondary | ICD-10-CM | POA: Diagnosis not present

## 2019-12-14 DIAGNOSIS — R6881 Early satiety: Secondary | ICD-10-CM

## 2019-12-14 DIAGNOSIS — D131 Benign neoplasm of stomach: Secondary | ICD-10-CM

## 2019-12-14 DIAGNOSIS — K219 Gastro-esophageal reflux disease without esophagitis: Secondary | ICD-10-CM

## 2019-12-14 DIAGNOSIS — D122 Benign neoplasm of ascending colon: Secondary | ICD-10-CM

## 2019-12-14 MED ORDER — SODIUM CHLORIDE 0.9 % IV SOLN: 500.0000 mL | Freq: Once | INTRAVENOUS | Status: DC

## 2019-12-14 MED ORDER — METOCLOPRAMIDE HCL 5 MG PO TABS: 5.0000 mg | ORAL_TABLET | Freq: Three times a day (TID) | ORAL | 0 refills | Status: DC

## 2019-12-14 NOTE — Progress Notes (Signed)
A and O x3. Report to RN. Tolerated MAC anesthesia well.Teeth unchanged after procedure.

## 2019-12-14 NOTE — Op Note (Signed)
[place] Patient Name: Lisa Norton Procedure Date: 12/14/2019 3:13 PM MRN: [id number] Endoscopist: Remo Lipps P. Havery Moros , MD Age: 49 Referring MD:  Date of Birth: 10-27-1970 Gender: Female Account #: [id number] Procedure:                Colonoscopy Indications:              Intermittent rectal bleeding in the setting of                            Eliquis - patient last took Eliquis 2 days ago                            however has completed course for DVT and will not                            resume it, history of hemorrhoidectomy and surgery                            for anal fissure Medicines:                Monitored Anesthesia Care Procedure:                Pre-Anesthesia Assessment:                           - Prior to the procedure, a History and Physical                            was performed, and patient medications and                            allergies were reviewed. The patient's tolerance of                            previous anesthesia was also reviewed. The risks                            and benefits of the procedure and the sedation                            options and risks were discussed with the patient.                            All questions were answered, and informed consent                            was obtained. Prior Anticoagulants: The patient has                            taken Eliquis (apixaban), last dose was 2 days                            prior to procedure. ASA Grade Assessment: III - A  patient with severe systemic disease. After                            reviewing the risks and benefits, the patient was                            deemed in satisfactory condition to undergo the                            procedure.                           After obtaining informed consent, the colonoscope                            was passed under direct vision. Throughout the   procedure, the patient's blood pressure, pulse, and                            oxygen saturations were monitored continuously. The                            Colonoscope was introduced through the anus and                            advanced to the the terminal ileum, with                            identification of the appendiceal orifice and IC                            valve. The colonoscopy was performed without                            difficulty. The patient tolerated the procedure                            well. The quality of the bowel preparation was                            good. The terminal ileum, ileocecal valve,                            appendiceal orifice, and rectum were photographed. Scope In: 3:34:58 PM Scope Out: 3:51:41 PM Scope Withdrawal Time: 0 hours 14 minutes 37 seconds  Total Procedure Duration: 0 hours 16 minutes 43 seconds  Findings:                 The perianal and digital rectal examinations were                            normal.                           The terminal ileum appeared normal.  A 3 mm polyp was found in the ascending colon. The                            polyp was flat. The polyp was removed with a cold                            biopsy forceps. Resection and retrieval were                            complete.                           Scattered medium-mouthed diverticula were found in                            the left colon and right colon.                           Internal hemorrhoids were found during retroflexion.                           Anal papilla(e) were hypertrophied.                           There was post surgical changes / scarring from                            prior rectal / perianal surgeries. The exam was                            otherwise without abnormality. Complications:            No immediate complications. Estimated blood loss:                            Minimal. Estimated  Blood Loss:     Estimated blood loss was minimal. Impression:               - The examined portion of the ileum was normal.                           - One 3 mm polyp in the ascending colon, removed                            with a cold biopsy forceps. Resected and retrieved.                           - Diverticulosis in the left colon and in the right                            colon.                           - Internal hemorrhoids.                           -  Anal papilla(e) were hypertrophied.                           - The examination was otherwise normal.                           Suspect bleeding was likely due to hemorrhoids in                            the setting of anticoagulation, no other etiologies                            noted on this exam. Hopefully improves now that                            anticoagulation course completed. Recommendation:           - Patient has a contact number available for                            emergencies. The signs and symptoms of potential                            delayed complications were discussed with the                            patient. Return to normal activities tomorrow.                            Written discharge instructions were provided to the                            patient.                           - Resume previous diet.                           - Continue present medications.                           - Await pathology results. Remo Lipps P. Aidan Caloca, MD 12/14/2019 3:58:12 PM This report has been signed electronically.

## 2019-12-14 NOTE — Op Note (Addendum)
[name] Patient Name: Lisa Norton Procedure Date: 12/14/2019 3:14 PM Lisa Norton Endoscopist: Remo Lipps P. Havery Moros , MD Age: 49 Referring MD:  Date of Birth: April 20, 1971 Gender: Female Account #: [id number] Procedure:                Upper GI endoscopy Indications:              Follow-up of gastro-esophageal reflux disease,                            Early satiety - on protonix with not much                            improvement in symptoms Medicines:                Monitored Anesthesia Care Procedure:                Pre-Anesthesia Assessment:                           - Prior to the procedure, a History and Physical                            was performed, and patient medications and                            allergies were reviewed. The patient's tolerance of                            previous anesthesia was also reviewed. The risks                            and benefits of the procedure and the sedation                            options and risks were discussed with the patient.                            All questions were answered, and informed consent                            was obtained. Prior Anticoagulants: The patient has                            taken Eliquis (apixaban), last dose was 2 days                            prior to procedure. ASA Grade Assessment: III - A                            patient with severe systemic disease. After                            reviewing the risks and benefits, the patient was  deemed in satisfactory condition to undergo the                            procedure.                           After obtaining informed consent, the endoscope was                            passed under direct vision. Throughout the                            procedure, the patient's blood pressure, pulse, and                            oxygen saturations were monitored continuously. The       Endoscope was introduced through the mouth, and                            advanced to the second part of duodenum. The upper                            GI endoscopy was accomplished without difficulty.                            The patient tolerated the procedure well. Scope In: Scope Out: Findings:                 Esophagogastric landmarks were identified: the                            Z-line was found at 36 cm, the gastroesophageal                            junction was found at 36 cm and the upper extent of                            the gastric folds was found at 38 cm from the                            incisors.                           A 2 cm hiatal hernia was present.                           A single 2-3 mm polyp was found 20 cm from the                            incisors. The polyp was removed with a cold biopsy                            forceps. Resection and retrieval were complete.  The exam of the esophagus was otherwise normal.                           Retained fluid was found in the gastric body and                            was suctioned.                           The exam of the stomach was otherwise normal.                           Biopsies were taken with a cold forceps in the                            gastric body, at the incisura and in the gastric                            antrum for Helicobacter pylori testing.                           The duodenal bulb and second portion of the                            duodenum were normal. Complications:            No immediate complications. Estimated blood loss:                            Minimal. Estimated Blood Loss:     Estimated blood loss was minimal. Impression:               - Esophagogastric landmarks identified.                           - 2 cm hiatal hernia.                           - Esophageal polyp was found. Resected and                            retrieved.                            - Retained gastric fluid.                           - Normal stomach otherwise - biopsies taken to rule                            out H pylori                           - Normal duodenal bulb and second portion of the  duodenum. Recommendation:           - Patient has a contact number available for                            emergencies. The signs and symptoms of potential                            delayed complications were discussed with the                            patient. Return to normal activities tomorrow.                            Written discharge instructions were provided to the                            patient.                           - Resume previous diet.                           - Continue present medications.                           - Await pathology results.                           - Consideration for trial of low dose Reglan for                            possible gastroparesis, start 5mg  three times daily                            prior to meals Remo Lipps P. Shakenna Herrero, MD 12/14/2019 4:04:03 PM This report has been signed electronically.

## 2019-12-14 NOTE — Patient Instructions (Signed)
Handouts Provided:  Polyps and Diverticulosis ? ?YOU HAD AN ENDOSCOPIC PROCEDURE TODAY AT THE [facility]:   Refer to the procedure report that was given to you for any specific questions about what was found during the examination.  If the procedure report does not answer your questions, please call your gastroenterologist to clarify.  If you requested that your care partner not be given the details of your procedure findings, then the procedure report has been included in a sealed envelope for you to review at your convenience later. ? ?YOU SHOULD EXPECT: Some feelings of bloating in the abdomen. Passage of more gas than usual.  Walking can help get rid of the air that was put into your GI tract during the procedure and reduce the bloating. If you had a lower endoscopy (such as a colonoscopy or flexible sigmoidoscopy) you may notice spotting of blood in your stool or on the toilet paper. If you underwent a bowel prep for your procedure, you may not have a normal bowel movement for a few days. ? ?Please Note:  You might notice some irritation and congestion in your nose or some drainage.  This is from the oxygen used during your procedure.  There is no need for concern and it should clear up in a day or so. ? ?SYMPTOMS TO REPORT IMMEDIATELY: ? ?Following lower endoscopy (colonoscopy or flexible sigmoidoscopy): ? Excessive amounts of blood in the stool ? Significant tenderness or worsening of abdominal pains ? Swelling of the abdomen that is new, acute ? Fever of 100?F or higher ? ?Following upper endoscopy (EGD) ? Vomiting of blood or coffee ground material ? New chest pain or pain under the shoulder blades ? Painful or persistently difficult swallowing ? New shortness of breath ? Fever of 100?F or higher ? Black, tarry-looking stools ? ?For urgent or emergent issues, a gastroenterologist can be reached at any hour by calling [phone]. ?Do not use MyChart messaging for urgent concerns.   ? ? ?DIET:  We do recommend a small meal at first, but then you may proceed to your regular diet.  Drink plenty of fluids but you should avoid alcoholic beverages for 24 hours. ? ?ACTIVITY:  You should plan to take it easy for the rest of today and you should NOT DRIVE or use heavy machinery until tomorrow (because of the sedation medicines used during the test).   ? ?FOLLOW UP: ?Our staff will call the number listed on your records 48-72 hours following your procedure to check on you and address any questions or concerns that you may have regarding the information given to you following your procedure. If we do not reach you, we will leave a message.  We will attempt to reach you two times.  During this call, we will ask if you have developed any symptoms of COVID 19. If you develop any symptoms (ie: fever, flu-like symptoms, shortness of breath, cough etc.) before then, please call [phone].  If you test positive for Covid 19 in the 2 weeks post procedure, please call and report this information to us.   ? ?If any biopsies were taken you will be contacted by phone or by letter within the next 1-3 weeks.  Please call us at [phone] if you have not heard about the biopsies in 3 weeks.  ? ? ?SIGNATURES/CONFIDENTIALITY: ?You and/or your care partner have signed paperwork which will be entered into your electronic medical record.  These signatures attest to the fact that   fact that that the information above on your After Visit Summary has been reviewed and is understood.  Full responsibility of the confidentiality of this discharge information lies with you and/or your care-partner.

## 2019-12-14 NOTE — Progress Notes (Signed)
Vitals-CW Temp-LC  Pt's states no medical or surgical changes since previsit or office visit.  

## 2019-12-18 ENCOUNTER — Other Ambulatory Visit: Payer: Self-pay

## 2019-12-18 ENCOUNTER — Ambulatory Visit (HOSPITAL_COMMUNITY): Payer: Medicaid Other

## 2019-12-18 ENCOUNTER — Telehealth (HOSPITAL_COMMUNITY): Payer: Medicaid Other | Admitting: Psychiatry

## 2019-12-18 ENCOUNTER — Telehealth: Payer: Self-pay

## 2019-12-18 NOTE — Telephone Encounter (Signed)
  Follow up Call-  Call back number 12/14/2019  Post procedure Call Back phone  # [phone]  Permission to leave phone message Yes  Some recent data might be hidden     Patient questions:  Do you have a fever, pain , or abdominal swelling? No. Pain Score  0 *  Have you tolerated food without any problems? Yes.    Have you been able to return to your normal activities? Yes.    Do you have any questions about your discharge instructions: Diet   No. Medications  No. Follow up visit  No.  Do you have questions or concerns about your Care? No.  Actions: * If pain score is 4 or above: 1. No action needed, pain <4.Have you developed a fever since your procedure? no  2.   Have you had an respiratory symptoms (SOB or cough) since your procedure? no  3.   Have you tested positive for COVID 19 since your procedure no  4.   Have you had any family members/close contacts diagnosed with the COVID 19 since your procedure?  no   If yes to any of these questions please route to Joylene John, RN and Erenest Rasher, RN

## 2019-12-19 ENCOUNTER — Other Ambulatory Visit: Payer: Self-pay | Admitting: *Deleted

## 2019-12-19 MED ORDER — HYDROXYZINE PAMOATE 50 MG PO CAPS: ORAL_CAPSULE | ORAL | 1 refills | Status: DC
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
## 2019-12-20 NOTE — Progress Notes (Signed)
Spoke with Lisa Norton and verified pathology information.  Per both the procedure report and nurse's charting- Bottle 1- gastric bx Bottle 2- gastric polyp bx Bottle 3- ascending polyp removal

## 2019-12-25 ENCOUNTER — Other Ambulatory Visit: Payer: Self-pay

## 2019-12-25 ENCOUNTER — Ambulatory Visit (HOSPITAL_BASED_OUTPATIENT_CLINIC_OR_DEPARTMENT_OTHER): Payer: Medicaid Other

## 2019-12-25 DIAGNOSIS — R0602 Shortness of breath: Secondary | ICD-10-CM | POA: Diagnosis present

## 2019-12-25 DIAGNOSIS — I82452 Acute embolism and thrombosis of left peroneal vein: Secondary | ICD-10-CM | POA: Diagnosis present

## 2019-12-26 ENCOUNTER — Ambulatory Visit (HOSPITAL_COMMUNITY)
Admission: RE | Admit: 2019-12-26 | Discharge: 2019-12-26 | Disposition: A | Discharge status: Home / Self Care | Payer: Medicaid Other | Source: Ambulatory Visit | Attending: Internal Medicine | Admitting: Internal Medicine

## 2019-12-26 ENCOUNTER — Telehealth (HOSPITAL_COMMUNITY): Payer: Medicaid Other | Admitting: Psychiatry

## 2019-12-26 DIAGNOSIS — I82452 Acute embolism and thrombosis of left peroneal vein: Secondary | ICD-10-CM | POA: Diagnosis not present

## 2019-12-26 DIAGNOSIS — R0602 Shortness of breath: Secondary | ICD-10-CM | POA: Diagnosis not present
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
## 2019-12-28 ENCOUNTER — Encounter: Payer: Self-pay | Admitting: Family

## 2019-12-28 ENCOUNTER — Encounter: Payer: Self-pay | Admitting: Family Medicine

## 2019-12-28 ENCOUNTER — Ambulatory Visit (INDEPENDENT_AMBULATORY_CARE_PROVIDER_SITE_OTHER): Payer: Medicaid Other | Admitting: Family

## 2019-12-28 DIAGNOSIS — B3731 Acute candidiasis of vulva and vagina: Secondary | ICD-10-CM

## 2019-12-28 DIAGNOSIS — B373 Candidiasis of vulva and vagina: Secondary | ICD-10-CM

## 2019-12-28 MED ORDER — FLUCONAZOLE 150 MG PO TABS: 150.0000 mg | ORAL_TABLET | ORAL | 0 refills | Status: DC | PRN

## 2019-12-28 NOTE — Progress Notes (Signed)
   Virtual Visit via telephone Note Due to COVID-19 pandemic this visit was conducted virtually. This visit type was conducted due to national recommendations for restrictions regarding the COVID-19 Pandemic (e.g. social distancing, sheltering in place) in an effort to limit this patient's exposure and mitigate transmission in our community. All issues noted in this document were discussed and addressed.  A physical exam was not performed with this format.  I connected with Lisa Norton on 12/28/19 at 3:40 pm by telephone and verified that I am speaking with the correct person using two identifiers. Lisa Norton is currently located at home and no one is currently with her during visit. The provider, Evelina Dun, FNP is located in their office at time of visit.  I discussed the limitations, risks, security and privacy concerns of performing an evaluation and management service by telephone and the availability of in person appointments. I also discussed with the patient that there may be a patient responsible charge related to this service. The patient expressed understanding and agreed to proceed.   History and Present Illness:  Vaginal Itching The patient's primary symptoms include genital itching. The patient's pertinent negatives include no vaginal bleeding or vaginal discharge. This is a new problem. The current episode started yesterday. The problem occurs constantly. The problem has been unchanged. Associated symptoms include dysuria. Pertinent negatives include no discolored urine, fever, flank pain, frequency, urgency or vomiting. She has tried antifungals for the symptoms. The treatment provided no relief.      Review of Systems  Constitutional: Negative for fever.  Gastrointestinal: Negative for vomiting.  Genitourinary: Positive for dysuria. Negative for flank pain, frequency, urgency and vaginal discharge.     Observations/Objective: No SOB or distress noted   Assessment  and Plan: 1. Vagina, candidiasis Keep clean and dry Do not scratch Probiotic as needed RTO if symptoms worsen or do not improve  - fluconazole (DIFLUCAN) 150 MG tablet; Take 1 tablet (150 mg total) by mouth every three (3) days as needed.  Dispense: 3 tablet; Refill: 0     I discussed the assessment and treatment plan with the patient. The patient was provided an opportunity to ask questions and all were answered. The patient agreed with the plan and demonstrated an understanding of the instructions.   The patient was advised to call back or seek an in-person evaluation if the symptoms worsen or if the condition fails to improve as anticipated.  The above assessment and management plan was discussed with the patient. The patient verbalized understanding of and has agreed to the management plan. Patient is aware to call the clinic if symptoms persist or worsen. Patient is aware when to return to the clinic for a follow-up visit. Patient educated on when it is appropriate to go to the emergency department.   Time call ended: 3:46 pm    I provided 6 minutes of non-face-to-face time during this encounter.    Evelina Dun, FNP

## 2020-01-01 ENCOUNTER — Telehealth (HOSPITAL_COMMUNITY): Payer: Self-pay | Admitting: Psychiatry

## 2020-01-01 ENCOUNTER — Other Ambulatory Visit: Payer: Self-pay

## 2020-01-01 DIAGNOSIS — F331 Major depressive disorder, recurrent, moderate: Secondary | ICD-10-CM

## 2020-01-01 NOTE — Telephone Encounter (Signed)
Therapist attempted to contact patient twice via text through caregility platform, no response.  Therapist called patient who reported she is traveling as a passenger in a car and is unable to have a confidential visit.  Therapist and patient agree to cancel today's session.  Therapist reminded patient of attendance policy.

## 2020-01-02 ENCOUNTER — Other Ambulatory Visit: Payer: Self-pay

## 2020-01-02 ENCOUNTER — Telehealth (INDEPENDENT_AMBULATORY_CARE_PROVIDER_SITE_OTHER): Payer: Medicaid Other | Admitting: Psychiatry

## 2020-01-02 ENCOUNTER — Encounter (HOSPITAL_COMMUNITY): Payer: Self-pay | Admitting: Psychiatry

## 2020-01-02 DIAGNOSIS — F4310 Post-traumatic stress disorder, unspecified: Secondary | ICD-10-CM

## 2020-01-02 DIAGNOSIS — F331 Major depressive disorder, recurrent, moderate: Secondary | ICD-10-CM | POA: Diagnosis not present

## 2020-01-02 MED ORDER — BUPROPION HCL ER (XL) 150 MG PO TB24: 150.0000 mg | ORAL_TABLET | Freq: Every day | ORAL | 1 refills | Status: DC

## 2020-01-02 MED ORDER — FLUOXETINE HCL 40 MG PO CAPS: 40.0000 mg | ORAL_CAPSULE | Freq: Every day | ORAL | 0 refills | Status: DC

## 2020-01-02 NOTE — Patient Instructions (Signed)
1.Continuefluoxetine 40 mg daily  2. Continue Abilify 5 mg daily 3. Start bupropion 150 mg daily  3. Next appointment:6/25 at 9:30

## 2020-01-02 NOTE — Progress Notes (Signed)
Opened in error

## 2020-01-15 ENCOUNTER — Other Ambulatory Visit: Payer: Self-pay

## 2020-01-15 ENCOUNTER — Ambulatory Visit (INDEPENDENT_AMBULATORY_CARE_PROVIDER_SITE_OTHER): Payer: Medicaid Other | Admitting: Psychiatry

## 2020-01-15 DIAGNOSIS — F4310 Post-traumatic stress disorder, unspecified: Secondary | ICD-10-CM

## 2020-01-15 DIAGNOSIS — F331 Major depressive disorder, recurrent, moderate: Secondary | ICD-10-CM

## 2020-01-15 NOTE — Progress Notes (Signed)
Opened in error

## 2020-01-22 ENCOUNTER — Other Ambulatory Visit: Payer: Self-pay

## 2020-01-22 ENCOUNTER — Ambulatory Visit (INDEPENDENT_AMBULATORY_CARE_PROVIDER_SITE_OTHER): Payer: Medicaid Other | Admitting: Psychiatry

## 2020-01-22 ENCOUNTER — Encounter (HOSPITAL_COMMUNITY): Payer: Self-pay

## 2020-01-22 DIAGNOSIS — F331 Major depressive disorder, recurrent, moderate: Secondary | ICD-10-CM

## 2020-01-22 DIAGNOSIS — F4310 Post-traumatic stress disorder, unspecified: Secondary | ICD-10-CM | POA: Diagnosis not present
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
## 2020-01-23 ENCOUNTER — Encounter (HOSPITAL_COMMUNITY): Payer: Self-pay | Admitting: *Deleted

## 2020-01-23 ENCOUNTER — Telehealth (HOSPITAL_COMMUNITY): Payer: Self-pay | Admitting: *Deleted

## 2020-01-23 NOTE — Telephone Encounter (Signed)
Advise her to take bupropion at night. Advise her to discontinue the medication if she continues to have drowsiness during the day despite this change.

## 2020-01-23 NOTE — Telephone Encounter (Signed)
Patient called stating that her buPROPion (WELLBUTRIN XL) 150 MG 24 hr tablet makes her sleepy and would like to know what to do. Patient next appt with you is 02-22-2020.

## 2020-01-23 NOTE — Telephone Encounter (Signed)
Message sent to patient mychart. Pt is aware that message would be sent to her mychart.

## 2020-01-24 ENCOUNTER — Encounter: Payer: Self-pay | Admitting: Cardiology

## 2020-01-24 ENCOUNTER — Ambulatory Visit (INDEPENDENT_AMBULATORY_CARE_PROVIDER_SITE_OTHER): Payer: Medicaid Other | Admitting: Cardiology

## 2020-01-24 ENCOUNTER — Other Ambulatory Visit: Payer: Self-pay

## 2020-01-24 VITALS — BP 122/64 | HR 94 | Ht 61.0 in | Wt 219.8 lb

## 2020-01-24 DIAGNOSIS — I739 Peripheral vascular disease, unspecified: Secondary | ICD-10-CM

## 2020-01-24 DIAGNOSIS — E785 Hyperlipidemia, unspecified: Secondary | ICD-10-CM

## 2020-01-24 DIAGNOSIS — Z720 Tobacco use: Secondary | ICD-10-CM

## 2020-01-24 DIAGNOSIS — I82452 Acute embolism and thrombosis of left peroneal vein: Secondary | ICD-10-CM | POA: Diagnosis not present

## 2020-01-24 DIAGNOSIS — R0602 Shortness of breath: Secondary | ICD-10-CM

## 2020-01-24 DIAGNOSIS — Z6841 Body Mass Index (BMI) 40.0 and over, adult: Secondary | ICD-10-CM

## 2020-01-24 MED ORDER — ATORVASTATIN CALCIUM 40 MG PO TABS
40.0000 mg | ORAL_TABLET | Freq: Every day | ORAL | 3 refills | Status: DC
Start: 2020-01-24 — End: 2020-07-23

## 2020-01-24 NOTE — Patient Instructions (Signed)
Medication Instructions:  STOP pravastatin START atorvastatin (Lipitor) 40 mg daily  *If you need a refill on your cardiac medications before your next appointment, please call your pharmacy*   Follow-Up: At [facility], you and your health needs are our priority.  As part of our continuing mission to provide you with exceptional heart care, we have created designated Provider Care Teams.  These Care Teams include your primary Cardiologist (physician) and Advanced Practice Providers (APPs -  Physician Assistants and Nurse Practitioners) who all work together to provide you with the care you need, when you need it.  We recommend signing up for the patient portal called "MyChart".  Sign up information is provided on this After Visit Summary.  MyChart is used to connect with patients for Virtual Visits (Telemedicine).  Patients are able to view lab/test results, encounter notes, upcoming appointments, etc.  Non-urgent messages can be sent to your provider as well.   To learn more about what you can do with MyChart, go to [email or web address].    Your next appointment:   6 month(s)  The format for your next appointment:   In Person  Provider:   Oswaldo Milian, MD

## 2020-02-08 ENCOUNTER — Other Ambulatory Visit: Payer: Self-pay

## 2020-02-08 ENCOUNTER — Ambulatory Visit (HOSPITAL_COMMUNITY): Payer: Medicaid Other | Admitting: Psychiatry

## 2020-02-08 DIAGNOSIS — F331 Major depressive disorder, recurrent, moderate: Secondary | ICD-10-CM

## 2020-02-12 ENCOUNTER — Other Ambulatory Visit: Payer: Self-pay

## 2020-02-12 ENCOUNTER — Ambulatory Visit (INDEPENDENT_AMBULATORY_CARE_PROVIDER_SITE_OTHER): Payer: Medicare Other | Admitting: Psychiatry

## 2020-02-12 DIAGNOSIS — F4310 Post-traumatic stress disorder, unspecified: Secondary | ICD-10-CM | POA: Diagnosis not present

## 2020-02-12 DIAGNOSIS — F331 Major depressive disorder, recurrent, moderate: Secondary | ICD-10-CM
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
## 2020-02-22 ENCOUNTER — Telehealth (INDEPENDENT_AMBULATORY_CARE_PROVIDER_SITE_OTHER): Payer: Medicare Other | Admitting: Psychiatry

## 2020-02-22 ENCOUNTER — Other Ambulatory Visit: Payer: Self-pay

## 2020-02-22 ENCOUNTER — Encounter (HOSPITAL_COMMUNITY): Payer: Self-pay | Admitting: Psychiatry

## 2020-02-22 DIAGNOSIS — F4310 Post-traumatic stress disorder, unspecified: Secondary | ICD-10-CM

## 2020-02-22 DIAGNOSIS — F331 Major depressive disorder, recurrent, moderate: Secondary | ICD-10-CM | POA: Diagnosis not present

## 2020-02-22 MED ORDER — FLUOXETINE HCL 20 MG PO CAPS: ORAL_CAPSULE | ORAL | 1 refills | Status: DC

## 2020-02-22 MED ORDER — ARIPIPRAZOLE 5 MG PO TABS: 5.0000 mg | ORAL_TABLET | Freq: Every day | ORAL | 0 refills | Status: DC

## 2020-02-22 NOTE — Patient Instructions (Signed)
1.Increasefluoxetine 60 mg daily 2. Continue Abilify 5 mg daily 3. Discontinue Bupropion 4. Next appointment:8/6 at 10:30

## 2020-02-25 ENCOUNTER — Ambulatory Visit: Payer: Medicaid Other | Admitting: Family Medicine

## 2020-02-29 ENCOUNTER — Ambulatory Visit (HOSPITAL_COMMUNITY): Payer: Medicaid Other | Admitting: Psychiatry

## 2020-03-05 ENCOUNTER — Other Ambulatory Visit: Payer: Self-pay | Admitting: *Deleted

## 2020-03-05 DIAGNOSIS — Z794 Long term (current) use of insulin: Secondary | ICD-10-CM

## 2020-03-05 DIAGNOSIS — E1165 Type 2 diabetes mellitus with hyperglycemia: Secondary | ICD-10-CM

## 2020-03-05 MED ORDER — DAPAGLIFLOZIN PROPANEDIOL 10 MG PO TABS: 10.0000 mg | ORAL_TABLET | Freq: Every day | ORAL | 0 refills | Status: DC

## 2020-03-14 ENCOUNTER — Ambulatory Visit (HOSPITAL_COMMUNITY): Payer: Medicaid Other | Admitting: Psychiatry

## 2020-03-28 ENCOUNTER — Ambulatory Visit (HOSPITAL_COMMUNITY): Payer: Medicaid Other | Admitting: Psychiatry
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
## 2020-04-04 ENCOUNTER — Telehealth (HOSPITAL_COMMUNITY): Payer: Medicare Other | Admitting: Psychiatry

## 2020-04-04 ENCOUNTER — Other Ambulatory Visit: Payer: Self-pay

## 2020-04-10 ENCOUNTER — Other Ambulatory Visit: Payer: Self-pay

## 2020-04-10 ENCOUNTER — Ambulatory Visit (INDEPENDENT_AMBULATORY_CARE_PROVIDER_SITE_OTHER): Payer: Medicare Other | Admitting: Psychiatry

## 2020-04-10 DIAGNOSIS — F331 Major depressive disorder, recurrent, moderate: Secondary | ICD-10-CM | POA: Diagnosis not present

## 2020-04-10 DIAGNOSIS — F4310 Post-traumatic stress disorder, unspecified: Secondary | ICD-10-CM
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
## 2020-04-15 ENCOUNTER — Other Ambulatory Visit: Payer: Self-pay

## 2020-04-15 ENCOUNTER — Ambulatory Visit (INDEPENDENT_AMBULATORY_CARE_PROVIDER_SITE_OTHER): Payer: Medicare Other | Admitting: Psychiatry

## 2020-04-15 DIAGNOSIS — F331 Major depressive disorder, recurrent, moderate: Secondary | ICD-10-CM

## 2020-04-15 DIAGNOSIS — F4310 Post-traumatic stress disorder, unspecified: Secondary | ICD-10-CM | POA: Diagnosis not present
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
## 2020-04-16 ENCOUNTER — Encounter (HOSPITAL_COMMUNITY): Payer: Self-pay | Admitting: Psychiatry

## 2020-04-16 ENCOUNTER — Telehealth (INDEPENDENT_AMBULATORY_CARE_PROVIDER_SITE_OTHER): Payer: Medicare Other | Admitting: Psychiatry

## 2020-04-16 ENCOUNTER — Other Ambulatory Visit: Payer: Self-pay

## 2020-04-16 DIAGNOSIS — F330 Major depressive disorder, recurrent, mild: Secondary | ICD-10-CM

## 2020-04-16 DIAGNOSIS — F4310 Post-traumatic stress disorder, unspecified: Secondary | ICD-10-CM | POA: Diagnosis not present

## 2020-04-16 MED ORDER — FLUOXETINE HCL 20 MG PO CAPS: ORAL_CAPSULE | ORAL | 0 refills | Status: DC

## 2020-04-16 MED ORDER — ARIPIPRAZOLE 5 MG PO TABS: 5.0000 mg | ORAL_TABLET | Freq: Every day | ORAL | 0 refills | Status: DC

## 2020-04-16 MED ORDER — FLUOXETINE HCL 40 MG PO CAPS: 40.0000 mg | ORAL_CAPSULE | Freq: Every day | ORAL | 0 refills | Status: DC

## 2020-04-16 NOTE — Patient Instructions (Signed)
1.Continue fluoxetine 60 mg daily 2.ContinueAbilify 5 mg daily 3. Next appointment:10/20 at 10:30

## 2020-04-18 ENCOUNTER — Other Ambulatory Visit: Payer: Self-pay | Admitting: Physician Assistant

## 2020-04-18 DIAGNOSIS — Z1231 Encounter for screening mammogram for malignant neoplasm of breast: Secondary | ICD-10-CM

## 2020-04-24 ENCOUNTER — Ambulatory Visit (HOSPITAL_COMMUNITY): Payer: Medicare Other | Admitting: Psychiatry

## 2020-05-02 ENCOUNTER — Ambulatory Visit (INDEPENDENT_AMBULATORY_CARE_PROVIDER_SITE_OTHER): Payer: Medicare HMO | Admitting: Psychiatry

## 2020-05-02 ENCOUNTER — Other Ambulatory Visit: Payer: Self-pay

## 2020-05-02 DIAGNOSIS — F330 Major depressive disorder, recurrent, mild: Secondary | ICD-10-CM

## 2020-05-02 DIAGNOSIS — F4310 Post-traumatic stress disorder, unspecified: Secondary | ICD-10-CM
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
## 2020-05-16 ENCOUNTER — Other Ambulatory Visit: Payer: Self-pay

## 2020-05-16 ENCOUNTER — Ambulatory Visit (HOSPITAL_COMMUNITY): Payer: Medicare Other | Admitting: Psychiatry

## 2020-05-20 ENCOUNTER — Other Ambulatory Visit: Payer: Self-pay | Admitting: *Deleted

## 2020-05-20 DIAGNOSIS — G8929 Other chronic pain: Secondary | ICD-10-CM

## 2020-05-20 DIAGNOSIS — E1140 Type 2 diabetes mellitus with diabetic neuropathy, unspecified: Secondary | ICD-10-CM

## 2020-05-20 DIAGNOSIS — M79604 Pain in right leg: Secondary | ICD-10-CM

## 2020-05-20 DIAGNOSIS — M545 Low back pain, unspecified: Secondary | ICD-10-CM

## 2020-05-20 DIAGNOSIS — M79605 Pain in left leg: Secondary | ICD-10-CM

## 2020-05-20 MED ORDER — GABAPENTIN 300 MG PO CAPS: 300.0000 mg | ORAL_CAPSULE | Freq: Three times a day (TID) | ORAL | 0 refills | Status: DC

## 2020-05-30 ENCOUNTER — Ambulatory Visit (INDEPENDENT_AMBULATORY_CARE_PROVIDER_SITE_OTHER): Payer: Medicare HMO | Admitting: Psychiatry

## 2020-05-30 ENCOUNTER — Other Ambulatory Visit: Payer: Self-pay

## 2020-05-30 DIAGNOSIS — F330 Major depressive disorder, recurrent, mild: Secondary | ICD-10-CM

## 2020-05-30 DIAGNOSIS — F4310 Post-traumatic stress disorder, unspecified: Secondary | ICD-10-CM
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
## 2020-06-12 ENCOUNTER — Other Ambulatory Visit: Payer: Self-pay | Admitting: Family Medicine

## 2020-06-12 DIAGNOSIS — E1165 Type 2 diabetes mellitus with hyperglycemia: Secondary | ICD-10-CM

## 2020-06-16 MED ORDER — DAPAGLIFLOZIN PROPANEDIOL 10 MG PO TABS: 10.0000 mg | ORAL_TABLET | Freq: Every day | ORAL | 0 refills | Status: DC

## 2020-06-16 NOTE — Telephone Encounter (Signed)
E-prescribe down. resent 

## 2020-06-16 NOTE — Addendum Note (Signed)
Addended by: Antonietta Barcelona D on: 06/16/2020 08:38 AM   Modules accepted: Orders

## 2020-06-18 ENCOUNTER — Telehealth (HOSPITAL_COMMUNITY): Payer: Medicare Other | Admitting: Psychiatry

## 2020-06-19 ENCOUNTER — Ambulatory Visit (HOSPITAL_COMMUNITY): Payer: Medicare HMO | Admitting: Psychiatry

## 2020-06-19 ENCOUNTER — Other Ambulatory Visit: Payer: Self-pay

## 2020-06-19 ENCOUNTER — Other Ambulatory Visit: Payer: Self-pay | Admitting: Nurse Practitioner

## 2020-06-19 DIAGNOSIS — M79604 Pain in right leg: Secondary | ICD-10-CM

## 2020-06-19 DIAGNOSIS — G8929 Other chronic pain: Secondary | ICD-10-CM

## 2020-06-19 DIAGNOSIS — E1140 Type 2 diabetes mellitus with diabetic neuropathy, unspecified: Secondary | ICD-10-CM

## 2020-06-19 DIAGNOSIS — M545 Low back pain, unspecified: Secondary | ICD-10-CM

## 2020-06-19 NOTE — Telephone Encounter (Signed)
Former [facility] NTBS 30 days given 05/20/20

## 2020-06-20 NOTE — Telephone Encounter (Signed)
Attempted to contact - NA 

## 2020-07-03 ENCOUNTER — Ambulatory Visit (INDEPENDENT_AMBULATORY_CARE_PROVIDER_SITE_OTHER): Payer: Medicare HMO | Admitting: Psychiatry

## 2020-07-03 ENCOUNTER — Other Ambulatory Visit: Payer: Self-pay

## 2020-07-03 DIAGNOSIS — F330 Major depressive disorder, recurrent, mild: Secondary | ICD-10-CM

## 2020-07-03 DIAGNOSIS — F4310 Post-traumatic stress disorder, unspecified: Secondary | ICD-10-CM | POA: Diagnosis not present
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
## 2020-07-16 ENCOUNTER — Other Ambulatory Visit: Payer: Self-pay

## 2020-07-16 ENCOUNTER — Ambulatory Visit (HOSPITAL_COMMUNITY): Payer: Medicare HMO | Admitting: Psychiatry

## 2020-07-22 ENCOUNTER — Encounter: Payer: Self-pay | Admitting: Cardiology

## 2020-07-22 ENCOUNTER — Ambulatory Visit (INDEPENDENT_AMBULATORY_CARE_PROVIDER_SITE_OTHER): Payer: Medicare HMO | Admitting: Cardiology

## 2020-07-22 ENCOUNTER — Other Ambulatory Visit: Payer: Self-pay

## 2020-07-22 VITALS — BP 120/74 | HR 77 | Ht 61.5 in | Wt 207.0 lb

## 2020-07-22 DIAGNOSIS — R0602 Shortness of breath: Secondary | ICD-10-CM | POA: Diagnosis not present

## 2020-07-22 DIAGNOSIS — I739 Peripheral vascular disease, unspecified: Secondary | ICD-10-CM | POA: Diagnosis not present

## 2020-07-22 DIAGNOSIS — E785 Hyperlipidemia, unspecified: Secondary | ICD-10-CM

## 2020-07-22 DIAGNOSIS — I82452 Acute embolism and thrombosis of left peroneal vein: Secondary | ICD-10-CM | POA: Diagnosis not present

## 2020-07-22 DIAGNOSIS — Z720 Tobacco use: Secondary | ICD-10-CM

## 2020-07-22 LAB — LIPID PANEL
Chol/HDL Ratio: 11.9 ratio — ABNORMAL HIGH (ref 0.0–4.4)
Cholesterol, Total: 214 mg/dL — ABNORMAL HIGH (ref 100–199)
HDL: 18 mg/dL — ABNORMAL LOW (ref >39)
LDL Chol Calc (NIH): 126 mg/dL — ABNORMAL HIGH (ref 0–99)
Triglycerides: 389 mg/dL — ABNORMAL HIGH (ref 0–149)
VLDL Cholesterol Cal: 70 mg/dL — ABNORMAL HIGH (ref 5–40)

## 2020-07-22 NOTE — Patient Instructions (Signed)
Medication Instructions:  Your physician recommends that you continue on your current medications as directed. Please refer to the Current Medication list given to you today.  *If you need a refill on your cardiac medications before your next appointment, please call your pharmacy*   Lab Work: Lipid today  If you have labs (blood work) drawn today and your tests are completely normal, you will receive your results only by: Marland Kitchen MyChart Message (if you have MyChart) OR . A paper copy in the mail If you have any lab test that is abnormal or we need to change your treatment, we will call you to review the results.  Follow-Up: At [facility], you and your health needs are our priority.  As part of our continuing mission to provide you with exceptional heart care, we have created designated Provider Care Teams.  These Care Teams include your primary Cardiologist (physician) and Advanced Practice Providers (APPs -  Physician Assistants and Nurse Practitioners) who all work together to provide you with the care you need, when you need it.  We recommend signing up for the patient portal called "MyChart".  Sign up information is provided on this After Visit Summary.  MyChart is used to connect with patients for Virtual Visits (Telemedicine).  Patients are able to view lab/test results, encounter notes, upcoming appointments, etc.  Non-urgent messages can be sent to your provider as well.   To learn more about what you can do with MyChart, go to [email or web address].    Your next appointment:   12 month(s)  The format for your next appointment:   In Person  Provider:   Oswaldo Milian, MD   Other Instructions You have been referred to Amy, our careguide to assist with smoking cessation
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
## 2020-07-23 ENCOUNTER — Other Ambulatory Visit: Payer: Self-pay | Admitting: *Deleted

## 2020-07-23 DIAGNOSIS — E785 Hyperlipidemia, unspecified: Secondary | ICD-10-CM

## 2020-07-23 MED ORDER — ATORVASTATIN CALCIUM 80 MG PO TABS: 80.0000 mg | ORAL_TABLET | Freq: Every day | ORAL | 3 refills | Status: AC

## 2020-07-31 ENCOUNTER — Other Ambulatory Visit: Payer: Self-pay | Admitting: Family Medicine

## 2020-07-31 DIAGNOSIS — E1165 Type 2 diabetes mellitus with hyperglycemia: Secondary | ICD-10-CM

## 2020-08-07 ENCOUNTER — Ambulatory Visit (INDEPENDENT_AMBULATORY_CARE_PROVIDER_SITE_OTHER): Payer: Medicare HMO | Admitting: Psychiatry

## 2020-08-07 ENCOUNTER — Other Ambulatory Visit: Payer: Self-pay

## 2020-08-07 DIAGNOSIS — F4310 Post-traumatic stress disorder, unspecified: Secondary | ICD-10-CM | POA: Diagnosis not present

## 2020-08-07 DIAGNOSIS — F330 Major depressive disorder, recurrent, mild: Secondary | ICD-10-CM
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
## 2020-08-26 ENCOUNTER — Telehealth: Payer: Self-pay

## 2020-08-26 DIAGNOSIS — Z0000 Encounter for general adult medical examination without abnormal findings: Secondary | ICD-10-CM

## 2020-08-26 NOTE — Telephone Encounter (Signed)
Called patient to discuss health coaching for smoking cessation per Dr. Bjorn Pippin. Patient has been scheduled for 09/08/20 at 1:00pm.

## 2020-09-03 ENCOUNTER — Other Ambulatory Visit: Payer: Self-pay | Admitting: *Deleted

## 2020-09-03 ENCOUNTER — Encounter: Payer: Self-pay | Admitting: *Deleted

## 2020-09-03 DIAGNOSIS — E785 Hyperlipidemia, unspecified: Secondary | ICD-10-CM

## 2020-09-08 ENCOUNTER — Telehealth: Payer: Self-pay

## 2020-09-08 ENCOUNTER — Ambulatory Visit: Payer: Medicare HMO

## 2020-09-08 DIAGNOSIS — Z0000 Encounter for general adult medical examination without abnormal findings: Secondary | ICD-10-CM

## 2020-09-08 NOTE — Telephone Encounter (Signed)
Called patient to reschedule appointment that was missed today at 1:00pm. Left message for patient to call back at [phone].

## 2020-09-08 NOTE — Telephone Encounter (Signed)
Called patient to inform her that Dr. Gardiner Rhyme wants labs done today while she is her for her session with me (Care Guide). Left patient a message to return call to [phone].

## 2020-09-18 ENCOUNTER — Other Ambulatory Visit: Payer: Self-pay | Admitting: Family Medicine

## 2020-09-18 DIAGNOSIS — E1165 Type 2 diabetes mellitus with hyperglycemia: Secondary | ICD-10-CM

## 2020-09-26 ENCOUNTER — Other Ambulatory Visit: Payer: Self-pay | Admitting: Family Medicine

## 2020-09-26 DIAGNOSIS — E1165 Type 2 diabetes mellitus with hyperglycemia: Secondary | ICD-10-CM

## 2020-12-06 ENCOUNTER — Other Ambulatory Visit: Payer: Self-pay

## 2020-12-06 DIAGNOSIS — I739 Peripheral vascular disease, unspecified: Secondary | ICD-10-CM

## 2020-12-24 ENCOUNTER — Encounter: Payer: Self-pay | Admitting: Vascular Surgery

## 2020-12-24 ENCOUNTER — Ambulatory Visit (HOSPITAL_COMMUNITY)
Admission: RE | Admit: 2020-12-24 | Discharge: 2020-12-24 | Disposition: A | Discharge status: Home / Self Care | Payer: Medicare HMO | Source: Ambulatory Visit | Attending: Vascular Surgery | Admitting: Vascular Surgery

## 2020-12-24 ENCOUNTER — Ambulatory Visit (INDEPENDENT_AMBULATORY_CARE_PROVIDER_SITE_OTHER): Payer: Medicare HMO | Admitting: Vascular Surgery

## 2020-12-24 ENCOUNTER — Other Ambulatory Visit: Payer: Self-pay

## 2020-12-24 VITALS — BP 122/81 | HR 82 | Temp 98.3°F | Resp 20 | Ht 61.5 in | Wt 206.0 lb

## 2020-12-24 DIAGNOSIS — I739 Peripheral vascular disease, unspecified: Secondary | ICD-10-CM | POA: Diagnosis not present

## 2020-12-24 NOTE — Progress Notes (Signed)
REASON FOR VISIT:   Follow-up of peripheral vascular disease  MEDICAL ISSUES:   PERIPHERAL VASCULAR DISEASE: This patient has evidence of infrainguinal arterial occlusive disease bilaterally but more significantly on the left side.  She has stable claudication.  I have encouraged her to try to get off her cigarettes completely.  She is lost 50 pounds and I encouraged her to continue to work on this.  We discussed the importance of nutrition and exercise.  She is on aspirin and is on a statin.  I would not recommend arteriography unless she developed rest pain or nonhealing ulcer.  I will plan on seeing her back in 1 year.  I ordered follow-up ABIs at that time.  She knows to call sooner if she has problems.   HPI:   Lisa Norton is a pleasant 50 y.o. female who I saw in consultation with a left leg DVT on 09/20/2019.  She had a peroneal DVT.  I encouraged her to elevate her leg and I did not think she needed a follow-up duplex unless the swelling increased.  However she was also noted to have evidence of peripheral vascular disease with infrainguinal arterial occlusive disease bilaterally especially on the left.  She had stable claudication.  We discussed the importance of tobacco cessation.  We also discussed importance of nutrition and exercise.  She comes in for routine follow-up visit.  Since I saw her last she has cut back on the cigarettes and is now smoking about a half a pack per day.  She continues to work on this.  She is lost 50 pounds and her hemoglobin A1c dropped from 13-17.4.  She is trying to walk as much as she can.  She has stable left calf claudication with no significant thigh or hip claudication.  She has minimal symptoms on the right side.  She does have left hip pain at rest and with sitting and sleeping which would suggest this is arthritic pain.  She also has some burning pain in her feet at night but I think this is from neuropathy and it does not sound like rest pain.   She has no history of nonhealing ulcers.  Past Medical History:  Diagnosis Date  . Anxiety   . COLD SORE 05/15/2010  . DEPRESSION 12/12/2009  . Diabetes mellitus without complication ([place])   . High cholesterol   . 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 HYPERTENSION 12/12/2009  . TOBACCO ABUSE 12/12/2009    Family History  Problem Relation Age of Onset  . Heart disease Mother   . Narcolepsy Mother   . Bipolar disorder Sister   . Allergic rhinitis Neg Hx   . Asthma Neg Hx   . Eczema Neg Hx   . Urticaria Neg Hx   . Colon cancer Neg Hx   . Esophageal cancer Neg Hx   . Stomach cancer Neg Hx   . Rectal cancer Neg Hx     SOCIAL HISTORY: Social History   Tobacco Use  . Smoking status: Current Every Day Smoker    Packs/day: 0.50    Years: 20.00    Pack years: 10.00    Types: Cigarettes  . Smokeless tobacco: Former Systems developer    Types: Snuff    Quit date: 05/05/1986  Substance Use Topics  . Alcohol use: Not Currently    Comment: had a bout of heavy drinking 2015, drinks occasionally now     Allergies  Allergen Reactions  . Metformin And Related Diarrhea  Current Outpatient Medications  Medication Sig Dispense Refill  . Accu-Chek FastClix Lancets MISC 4 TIMES DAILY 102 each 5  . ARIPiprazole (ABILIFY) 5 MG tablet Take 1 tablet (5 mg total) by mouth daily. 90 tablet 0  . aspirin EC 81 MG tablet Take 1 tablet (81 mg total) by mouth daily. 90 tablet 3  . atorvastatin (LIPITOR) 80 MG tablet Take 1 tablet (80 mg total) by mouth daily. 90 tablet 3  . cetirizine (ZYRTEC) 10 MG tablet Take 1 tablet (10 mg total) by mouth daily. 30 tablet 11  . clobetasol cream (TEMOVATE) 0.05 % APPLY AT BEDTIME 60 g 1  . dapagliflozin propanediol (FARXIGA) 10 MG TABS tablet Take 1 tablet (10 mg total) by mouth daily. (Needs to be seen before next refill) 30 tablet 0  . Dulaglutide (TRULICITY) 1.5 QI/3.4VQ SOPN Inject into the skin.    . fenofibrate (TRICOR) 145 MG tablet Take 1 tablet (145 mg total) by mouth  daily. 90 tablet 1  . fluconazole (DIFLUCAN) 150 MG tablet Take 1 tablet (150 mg total) by mouth every three (3) days as needed. 3 tablet 0  . FLUoxetine (PROZAC) 20 MG capsule Total of 60 mg daily. Take along with 40 mg cap 90 capsule 0  . FLUoxetine (PROZAC) 40 MG capsule Take 1 capsule (40 mg total) by mouth daily. 90 capsule 0  . fluticasone (FLONASE) 50 MCG/ACT nasal spray Place 2 sprays into both nostrils daily. 16 g 5  . glucose blood test strip Use to test BS QID and PRN dx.E11.65 100 each 12  . levocetirizine (XYZAL) 5 MG tablet Take 1 tablet (5 mg total) by mouth every evening. 60 tablet 5  . liraglutide (VICTOZA) 18 MG/3ML SOPN Inject 0.5 mLs (3 mg total) into the skin daily. 9 mL 6  . magnesium hydroxide (MILK OF MAGNESIA) 400 MG/5ML suspension Take 5-10 mLs by mouth daily as needed for mild constipation.    . metoCLOPramide (REGLAN) 5 MG tablet Take 1 tablet (5 mg total) by mouth 3 (three) times daily before meals. 90 tablet 0  . mupirocin ointment (BACTROBAN) 2 % Apply 1 application topically 2 (two) times daily. 22 g 0  . ondansetron (ZOFRAN) 4 MG tablet Take 1 tablet (4 mg total) by mouth every 6 (six) hours as needed for nausea or vomiting. 50 tablet 1  . oxyCODONE-acetaminophen (PERCOCET) 5-325 MG tablet Take 1 tablet by mouth every 6 (six) hours as needed for severe pain. 30 tablet 0  . pantoprazole (PROTONIX) 40 MG tablet Take 1 tablet (40 mg total) by mouth daily. 30 tablet 8  . phentermine 30 MG capsule Take 30 mg by mouth every morning.    Marland Kitchen ULTICARE MICRO PEN NEEDLES 32G X 4 MM MISC 3 TIMES DAILY 100 each 11  . valACYclovir (VALTREX) 1000 MG tablet Take 1 tablet (1,000 mg total) by mouth 2 (two) times daily. 20 tablet 6  . EPINEPHrine (EPIPEN 2-PAK) 0.3 mg/0.3 mL IJ SOAJ injection Inject 0.3 mLs (0.3 mg total) into the muscle as needed for anaphylaxis. (Patient not taking: Reported on 12/24/2020) 2 Device 2  . gabapentin (NEURONTIN) 300 MG capsule Take 1 capsule (300 mg  total) by mouth 3 (three) times daily. Increase to 4x/day as needed (Needs to be seen before next refill) 90 capsule 0   No current facility-administered medications for this visit.    REVIEW OF SYSTEMS:  [X]  denotes positive finding, [ ]  denotes negative finding Cardiac  Comments:  Chest pain or chest  pressure:    Shortness of breath upon exertion:    Short of breath when lying flat:    Irregular heart rhythm:        Vascular    Pain in calf, thigh, or hip brought on by ambulation: x   Pain in feet at night that wakes you up from your sleep:     Blood clot in your veins:    Leg swelling:         Pulmonary    Oxygen at home:    Productive cough:     Wheezing:         Neurologic    Sudden weakness in arms or legs:     Sudden numbness in arms or legs:     Sudden onset of difficulty speaking or slurred speech:    Temporary loss of vision in one eye:     Problems with dizziness:         Gastrointestinal    Blood in stool:     Vomited blood:         Genitourinary    Burning when urinating:     Blood in urine:        Psychiatric    Major depression:         Hematologic    Bleeding problems:    Problems with blood clotting too easily:        Skin    Rashes or ulcers:        Constitutional    Fever or chills:     PHYSICAL EXAM:   Vitals:   12/24/20 0924  BP: 122/81  Pulse: 82  Resp: 20  Temp: 98.3 F (36.8 C)  SpO2: 96%  Weight: 206 lb (93.4 kg)  Height: 5' 1.5" (1.562 m)    GENERAL: The patient is a well-nourished female, in no acute distress. The vital signs are documented above. CARDIAC: There is a regular rate and rhythm.  VASCULAR: I do not detect carotid bruits. She has palpable femoral pulses. I cannot palpate popliteal or pedal pulses. PULMONARY: There is good air exchange bilaterally without wheezing or rales. ABDOMEN: Soft and non-tender with normal pitched bowel sounds.  MUSCULOSKELETAL: There are no major deformities or  cyanosis. NEUROLOGIC: No focal weakness or paresthesias are detected. SKIN: There are no ulcers or rashes noted. PSYCHIATRIC: The patient has a normal affect.  DATA:    ARTERIAL DOPPLER STUDY: I have independently interpreted her arterial Doppler study today.  On the right side she has a biphasic posterior tibial signal with a triphasic dorsalis pedis signal.  ABI is 86%.  Toe pressure 78 mmHg.  On the left side there is a monophasic dorsalis pedis and posterior tibial signal.  ABI is 65%.  Toe pressure 72 mmHg.  Deitra Mayo [facility] [phone]

## 2021-02-27 ENCOUNTER — Telehealth: Payer: Self-pay | Admitting: Cardiology

## 2021-02-27 NOTE — Telephone Encounter (Signed)
Pt received her xray results in the mail  today and she needs someone and explain them to her. Please advise pt further

## 2021-02-27 NOTE — Telephone Encounter (Signed)
Returned call to patient, she states she had xray completed by pain management doctor, they did not call her but mailed her to report.   She states it mentions "atherosclerosis" but unsure where.    Advised if she is able to send via Mychart, we will be glad to look at the report.   Advised she does have known PAD, so may be referring to this but will have Dr. Gardiner Rhyme review.  Patient aware and verbalized understanding.  Patient will send via [place].

## 2021-03-13 DIAGNOSIS — E785 Hyperlipidemia, unspecified: Secondary | ICD-10-CM

## 2021-03-13 DIAGNOSIS — E1169 Type 2 diabetes mellitus with other specified complication: Secondary | ICD-10-CM

## 2021-03-13 MED ORDER — FENOFIBRATE 145 MG PO TABS: 145.0000 mg | ORAL_TABLET | Freq: Every day | ORAL | 1 refills | Status: DC

## 2021-04-04 IMAGING — NM NM GASTRIC EMPTYING
1 series · 1 of 1 positions shown · non-contrast
Comparison: None.

CLINICAL DATA: Abdominal pain. Nausea vomiting. Dysphagia.
Unintentional weight loss of appetite.

EXAM:
NUCLEAR MEDICINE GASTRIC EMPTYING SCAN
TECHNIQUE: After oral ingestion of radiolabeled meal, sequential abdominal
images were obtained for 120 minutes. Residual percentage of
activity remaining within the stomach was calculated at 60 and 120
minutes.
RADIOPHARMACEUTICALS:  2.0 mCi Cc-FFm sulfur colloid in standardized
meal

[Series 1: 0 min · 4.14mm/px · 1 of 1 slices shown]
[im 1/1]
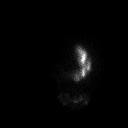

[1 of 1 positions shown; findings below may reference images not displayed]

FINDINGS: Expected location of the stomach in the left upper quadrant.
Ingested meal empties the stomach gradually over the course of the
study with 28% retention at 60 min and 5% retention at 120 min
(normal retention less than 30% at a 120 min).
IMPRESSION: Normal gastric emptying study.
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
## 2021-07-21 ENCOUNTER — Ambulatory Visit (INDEPENDENT_AMBULATORY_CARE_PROVIDER_SITE_OTHER): Payer: Medicare HMO | Admitting: Cardiology

## 2021-07-21 ENCOUNTER — Other Ambulatory Visit: Payer: Self-pay

## 2021-07-21 ENCOUNTER — Ambulatory Visit (INDEPENDENT_AMBULATORY_CARE_PROVIDER_SITE_OTHER): Payer: Medicare HMO

## 2021-07-21 VITALS — BP 122/80 | HR 79 | Ht 61.5 in | Wt 209.4 lb

## 2021-07-21 DIAGNOSIS — Z720 Tobacco use: Secondary | ICD-10-CM

## 2021-07-21 DIAGNOSIS — E785 Hyperlipidemia, unspecified: Secondary | ICD-10-CM

## 2021-07-21 DIAGNOSIS — R002 Palpitations: Secondary | ICD-10-CM

## 2021-07-21 DIAGNOSIS — I739 Peripheral vascular disease, unspecified: Secondary | ICD-10-CM

## 2021-07-21 MED ORDER — EZETIMIBE 10 MG PO TABS: 10.0000 mg | ORAL_TABLET | Freq: Every day | ORAL | 3 refills | Status: DC

## 2021-07-21 NOTE — Patient Instructions (Addendum)
Medication Instructions:  START ZETIA (EZETIMIBE)  10 MG DAILY   *If you need a refill on your cardiac medications before your next appointment, please call your pharmacy*  Lab Work: NONE   Testing/Procedures: 2 WEEK ZIO PATCH  THIS WILL BE MAILED TO YOU   Follow-Up: At [facility], you and your health needs are our priority.  As part of our continuing mission to provide you with exceptional heart care, we have created designated Provider Care Teams.  These Care Teams include your primary Cardiologist (physician) and Advanced Practice Providers (APPs -  Physician Assistants and Nurse Practitioners) who all work together to provide you with the care you need, when you need it.  We recommend signing up for the patient portal called "MyChart".  Sign up information is provided on this After Visit Summary.  MyChart is used to connect with patients for Virtual Visits (Telemedicine).  Patients are able to view lab/test results, encounter notes, upcoming appointments, etc.  Non-urgent messages can be sent to your provider as well.   To learn more about what you can do with MyChart, go to [email or web address].    Your next appointment:   12 month(s)  The format for your next appointment:   In Person  Provider:   Donato Heinz, MD {  Other Instructions  ZIO XT- Long Term Monitor Instructions  Your physician has requested you wear a ZIO patch monitor for 14 days.  This is a single patch monitor. [employer] supplies one patch monitor per enrollment. Additional stickers are not available. Please do not apply patch if you will be having a Nuclear Stress Test,  Echocardiogram, Cardiac CT, MRI, or Chest Xray during the period you would be wearing the  monitor. The patch cannot be worn during these tests. You cannot remove and re-apply the  ZIO XT patch monitor.  Your ZIO patch monitor will be mailed 3 day USPS to your address on file. It may take 3-5 days  to receive your monitor  after you have been enrolled.  Once you have received your monitor, please review the enclosed instructions. Your monitor  has already been registered assigning a specific monitor serial # to you.  Billing and Patient Assistance Program Information  We have supplied [employer] with any of your insurance information on file for billing purposes. [employer] offers a sliding scale Patient Assistance Program for patients that do not have  insurance, or whose insurance does not completely cover the cost of the ZIO monitor.  You must apply for the Patient Assistance Program to qualify for this discounted rate.  To apply, please call [employer] at [phone], select option 4, select option 2, ask to apply for  Patient Assistance Program. Theodore Demark will ask your household income, and how many people  are in your household. They will quote your out-of-pocket cost based on that information.  [employer] will also be able to set up a 46-month, interest-free payment plan if needed.  Applying the monitor   Shave hair from upper left chest.  Hold abrader disc by orange tab. Rub abrader in 40 strokes over the upper left chest as  indicated in your monitor instructions.  Clean area with 4 enclosed alcohol pads. Let dry.  Apply patch as indicated in monitor instructions. Patch will be placed under collarbone on left  side of chest with arrow pointing upward.  Rub patch adhesive wings for 2 minutes. Remove white label marked "1". Remove the white  label marked "2". Rub patch adhesive wings for  2 additional minutes.  While looking in a mirror, press and release button in center of patch. A small green light will  flash 3-4 times. This will be your only indicator that the monitor has been turned on.  Do not shower for the first 24 hours. You may shower after the first 24 hours.  Press the button if you feel a symptom. You will hear a small click. Record Date, Time and  Symptom in the Patient Logbook.  When you are  ready to remove the patch, follow instructions on the last 2 pages of Patient  Logbook. Stick patch monitor onto the last page of Patient Logbook.  Place Patient Logbook in the blue and white box. Use locking tab on box and tape box closed  securely. The blue and white box has prepaid postage on it. Please place it in the mailbox as  soon as possible. Your physician should have your test results approximately 7 days after the  monitor has been mailed back to [facility].  Call [place] at [phone] if you have questions regarding  your ZIO XT patch monitor. Call them immediately if you see an orange light blinking on your  monitor.  If your monitor falls off in less than 4 days, contact our Monitor department at [phone].  If your monitor becomes loose or falls off after 4 days call [employer] at [phone] for  suggestions on securing your monitor

## 2021-07-21 NOTE — Progress Notes (Unsigned)
Enrolled for [employer] to mail a ZIO XT long term holter monitor to the patients address on file.  

## 2021-07-25 DIAGNOSIS — R002 Palpitations: Secondary | ICD-10-CM

## 2021-08-21 ENCOUNTER — Other Ambulatory Visit: Payer: Self-pay | Admitting: Cardiology

## 2021-08-21 DIAGNOSIS — E1169 Type 2 diabetes mellitus with other specified complication: Secondary | ICD-10-CM

## 2021-09-22 ENCOUNTER — Other Ambulatory Visit: Payer: Self-pay

## 2021-09-22 ENCOUNTER — Ambulatory Visit (INDEPENDENT_AMBULATORY_CARE_PROVIDER_SITE_OTHER): Payer: Medicare HMO | Admitting: Allergy

## 2021-09-22 ENCOUNTER — Encounter: Payer: Self-pay | Admitting: Allergy

## 2021-09-22 VITALS — BP 130/80 | HR 84 | Temp 98.0°F | Resp 18 | Ht 62.0 in | Wt 213.0 lb

## 2021-09-22 DIAGNOSIS — T7840XA Allergy, unspecified, initial encounter: Secondary | ICD-10-CM | POA: Insufficient documentation

## 2021-09-22 DIAGNOSIS — Z91018 Allergy to other foods: Secondary | ICD-10-CM | POA: Diagnosis not present

## 2021-09-22 DIAGNOSIS — T7840XD Allergy, unspecified, subsequent encounter: Secondary | ICD-10-CM

## 2021-09-22 DIAGNOSIS — J302 Other seasonal allergic rhinitis: Secondary | ICD-10-CM

## 2021-09-22 DIAGNOSIS — J3089 Other allergic rhinitis: Secondary | ICD-10-CM

## 2021-09-22 NOTE — Progress Notes (Signed)
Follow Up Note  RE: Lisa Norton MRN: [id number] DOB: [birth year] Date of Office Visit: 09/22/2021  Referring provider: No ref. provider found Primary care provider: Valinda Hoar, PA-C (Inactive)  Chief Complaint: Urticaria (ER visit Saturday from hives and throat swelling. Has had alph gal)  History of Present Illness: I had the pleasure of seeing Lisa Norton for a follow up visit at the [facility] on 09/22/2021. She is a 51 y.o. female, who is being followed for urticaria and allergic rhinitis. Her previous allergy office visit was on 01/02/2019 with Dr. Verlin Fester. Today is a new complaint visit of rash .  Patient avoided red meat after the last visit for about 1 year due to positive alpha gal bloodwork.  Alpha gal bloodwork was never rechecked and she failed to follow up. Patient reintroduced red meat at home with no issues and usually eats some type of red meat a few times per week.  Patient grilled out hamburgers on Saturday night. She had 1 hamburger  which was well done and about 3-4 hours afterwards around midnight she noted some hand itching and hives everywhere.  She noted some difficulty swallowing and breathing as well.   Patient went to the ER and was treated with epi, benadryl, decadron, Pepcid and was feeling better after a few hours.  Patient did not have a recurrent episode of hives since the last visit until the above incident.  No recent tick bites. Denies any changes in diet, meds or personal care products.  Patient has Epipen at home.  Patient had cole slaw, burger, pickle, mustard, ketchup, cheese.  Patient had cereal and milk since then with no issues.  She mentions that she had hibachi steak the day before.  Denies drinking alcohol with the above meal and these are things that she usually eats on a regular basis.   Seasonal and perennial allergic rhinitis Not taking any medications but noticed some nasal dryness and congestion  since she started on a cpap machine.   09/20/21 ER visit: Administered Medications - documented in this encounter Administered Medications - Inactive Administered Medications - up to 3 most recent administrations Inactive Administered Medications - up to 3 most recent administrations Medication Order [facility] Action Action Date Dose Rate Site  dexamethasone (DECADRON) injection 10 mg   10 mg, IntraVENous, Once, On Sun 09/20/21 at 0155, For 1 dose   Given 09/20/2021 1:43 AM EST 10 mg           diphenhydrAMINE (BENADRYL) injection 50 mg   50 mg, IntraVENous, Once, On Sun 09/20/21 at 0155, For 1 dose   Given 09/20/2021 1:43 AM EST 50 mg           EPINEPHrine PF/Sulfite free 1 mg/mL injection 0.3 mg   0.3 mg, Intramuscular, Once, On Sun 09/20/21 at 0155, For 1 dose   Given 09/20/2021 1:39 AM EST 0.3 mg   Left Deltoid       famotidine (PEPCID) injection 20 mg   20 mg, IntraVENous, Once, On Sun 09/20/21 at 0155, For 1 dose   Given 09/20/2021 1:43 AM EST 20 mg           NaCl 0.9% bolus 1,000 mL   1,000 mL, IntraVENous, Administer over 65 Minutes, Once, On Sun 09/20/21 at 0155, For 1 dose   New Bag 09/20/2021 1:39 AM EST 1,000 mLs 923.1 mL/hr      Assessment and Plan: Lisa Norton is a 51 y.o. female with: Allergic reaction  Allergic reaction on 09/19/2021 requiring ER visit with epinephrine, Decadron, Benadryl and Pepcid.  Patient had a cheese hamburger (well done) for dinner that night with coleslaw, ketchup, mustard, pickle.  These are things that she eats on a normal basis.  Had cereal with milk with no issues since then.  History of alpha gal allergy and blood work was never repeated.  She reintroduced red meat on her own at home and has been eating it a few times per week without any issues.  She also had steak the night before this reaction. Denies any changes in diet, meds or personal care products. Denies recent tick bites.  Discussed with patient that is possible that her alpha gal allergy returned or  she developed an allergy to something new. Continue to avoid red meat - see handout.  Avoid the following for now: cole slaw, mustard, ketchup, pickle. If the bloodwork is negative will reintroduce back into the diet at home.  Get bloodwork  For mild symptoms you can take over the counter antihistamines such as Benadryl and monitor symptoms closely. If symptoms worsen or if you have severe symptoms including breathing issues, throat closure, significant swelling, whole body hives, severe diarrhea and vomiting, lightheadedness then inject epinephrine and seek immediate medical care afterwards. Action plan given.  Allergy to alpha-gal See assessment and plan as above.  Seasonal and perennial allergic rhinitis Past history - 2020 skin testing was positive to grass, ragweed, weed, trees, mold, dust mites, cockroach. Continue environmental control measures.  Use over the counter antihistamines such as Zyrtec (cetirizine), Claritin (loratadine), Allegra (fexofenadine), or Xyzal (levocetirizine) daily as needed. May take twice a day during allergy flares. May switch antihistamines every few months. Use Flonase (fluticasone) nasal spray 1 spray per nostril twice a day as needed for nasal congestion.  Nasal saline spray (i.e., Simply Saline) or nasal saline lavage (i.e., NeilMed) is recommended as needed and prior to medicated nasal sprays.  Return in about 1 year (around 09/22/2022).  No orders of the defined types were placed in this encounter.  Lab Orders         Alpha-Gal Panel         ANA w/Reflex         CBC with Differential/Platelet         Comprehensive metabolic panel         C3 and C4         Tryptase         Sedimentation rate         Thyroid Cascade Profile         Food Allergy Profile         Allergen, Mustard, f89         Tomato IgE         Allergen, Cabbage, f216         Allergen, Cucumber, f244      Diagnostics: None.   Medication List:  Current Outpatient Medications   Medication Sig Dispense Refill   Accu-Chek FastClix Lancets MISC 4 TIMES DAILY 102 each 5   amitriptyline (ELAVIL) 50 MG tablet Take by mouth.     aspirin EC 81 MG tablet Take 1 tablet (81 mg total) by mouth daily. 90 tablet 3   betamethasone dipropionate 0.05 % lotion      Blood Glucose Monitoring Suppl (ONETOUCH VERIO FLEX SYSTEM) w/Device KIT      cetirizine (ZYRTEC) 10 MG tablet Take 1 tablet (10 mg total) by mouth daily. 30 tablet 11  dapagliflozin propanediol (FARXIGA) 10 MG TABS tablet Take 1 tablet (10 mg total) by mouth daily. (Needs to be seen before next refill) 30 tablet 0   Dulaglutide (TRULICITY) 1.5 OA/4.1YS SOPN Inject into the skin.     EPINEPHrine (EPIPEN 2-PAK) 0.3 mg/0.3 mL IJ SOAJ injection Inject 0.3 mLs (0.3 mg total) into the muscle as needed for anaphylaxis. 2 Device 2   EPINEPHrine 0.3 mg/0.3 mL IJ SOAJ injection Inject into the skin.     ezetimibe (ZETIA) 10 MG tablet Take 1 tablet (10 mg total) by mouth daily. 90 tablet 3   fenofibrate (TRICOR) 145 MG tablet TAKE ONE TABLET BY MOUTH ONCE DAILY 90 tablet 1   fenofibrate (TRICOR) 48 MG tablet Take 48 mg by mouth daily.     fluticasone (FLONASE) 50 MCG/ACT nasal spray Place 2 sprays into both nostrils daily. 16 g 5   gabapentin (NEURONTIN) 400 MG capsule Take 400 mg by mouth daily.     glucose blood test strip Use to test BS QID and PRN dx.E11.65 100 each 12   naloxone (NARCAN) nasal spray 4 mg/0.1 mL SMARTSIG:Both Nares     omeprazole (PRILOSEC) 40 MG capsule      ondansetron (ZOFRAN) 4 MG tablet Take 1 tablet (4 mg total) by mouth every 6 (six) hours as needed for nausea or vomiting. 50 tablet 1   oxyCODONE-acetaminophen (PERCOCET) 5-325 MG tablet Take 1 tablet by mouth every 6 (six) hours as needed for severe pain. 30 tablet 0   phentermine 30 MG capsule Take 30 mg by mouth every morning.     phentermine 30 MG capsule Take by mouth.     topiramate (TOPAMAX) 25 MG tablet Take 25 mg by mouth 2 (two) times daily.      ULTICARE MICRO PEN NEEDLES 32G X 4 MM MISC 3 TIMES DAILY 100 each 11   valACYclovir (VALTREX) 1000 MG tablet Take 1 tablet (1,000 mg total) by mouth 2 (two) times daily. 20 tablet 6   atorvastatin (LIPITOR) 80 MG tablet Take 1 tablet (80 mg total) by mouth daily. 90 tablet 3   gabapentin (NEURONTIN) 300 MG capsule Take 1 capsule (300 mg total) by mouth 3 (three) times daily. Increase to 4x/day as needed (Needs to be seen before next refill) 90 capsule 0   No current facility-administered medications for this visit.   Allergies: Allergies  Allergen Reactions   Metformin And Related Diarrhea   I reviewed her past medical history, social history, family history, and environmental history and no significant changes have been reported from her previous visit.  Review of Systems  Constitutional:  Negative for appetite change, chills, fever and unexpected weight change.  HENT:  Positive for congestion. Negative for rhinorrhea.   Eyes:  Negative for itching.  Respiratory:  Negative for cough, chest tightness, shortness of breath and wheezing.   Gastrointestinal:  Negative for abdominal pain.  Skin:  Negative for rash.  Allergic/Immunologic: Positive for environmental allergies.  Neurological:  Negative for headaches.   Objective: BP 130/80 (BP Location: Left Arm, Patient Position: Sitting, Cuff Size: Normal)    Pulse 84    Temp 98 F (36.7 C) (Temporal)    Resp 18    Ht '5\' 2"'  (1.575 m)    Wt 213 lb (96.6 kg)    LMP 09/07/2017    SpO2 97%    BMI 38.96 kg/m  Body mass index is 38.96 kg/m. Physical Exam Vitals and nursing note reviewed.  Constitutional:  Appearance: Normal appearance. She is well-developed.  HENT:     Head: Normocephalic and atraumatic.     Right Ear: Tympanic membrane and external ear normal.     Left Ear: Tympanic membrane and external ear normal.     Nose: Nose normal.     Mouth/Throat:     Mouth: Mucous membranes are moist.     Pharynx: Oropharynx is clear.   Eyes:     Conjunctiva/sclera: Conjunctivae normal.  Cardiovascular:     Rate and Rhythm: Normal rate and regular rhythm.     Heart sounds: Normal heart sounds. No murmur heard. Pulmonary:     Effort: Pulmonary effort is normal.     Breath sounds: Normal breath sounds. No wheezing, rhonchi or rales.  Musculoskeletal:     Cervical back: Neck supple.  Skin:    General: Skin is warm.     Findings: No rash.  Neurological:     Mental Status: She is alert and oriented to person, place, and time.  Psychiatric:        Behavior: Behavior normal.   Previous notes and tests were reviewed. The plan was reviewed with the patient/family, and all questions/concerned were addressed.  It was my pleasure to see Malak today and participate in her care. Please feel free to contact me with any questions or concerns.  Sincerely,  Rexene Alberts, DO Allergy & Immunology  [facility] office: [place] office: [phone]

## 2021-09-22 NOTE — Assessment & Plan Note (Signed)
Past history - 2020 skin testing was positive to grass, ragweed, weed, trees, mold, dust mites, cockroach.  Continue environmental control measures.   Use over the counter antihistamines such as Zyrtec (cetirizine), Claritin (loratadine), Allegra (fexofenadine), or Xyzal (levocetirizine) daily as needed. May take twice a day during allergy flares. May switch antihistamines every few months.  Use Flonase (fluticasone) nasal spray 1 spray per nostril twice a day as needed for nasal congestion.   Nasal saline spray (i.e., Simply Saline) or nasal saline lavage (i.e., NeilMed) is recommended as needed and prior to medicated nasal sprays.

## 2021-09-22 NOTE — Assessment & Plan Note (Signed)
.   See assessment and plan as above. 

## 2021-09-22 NOTE — Assessment & Plan Note (Signed)
Allergic reaction on 09/19/2021 requiring ER visit with epinephrine, Decadron, Benadryl and Pepcid.  Patient had a cheese hamburger (well done) for dinner that night with coleslaw, ketchup, mustard, pickle.  These are things that she eats on a normal basis.  Had cereal with milk with no issues since then.  History of alpha gal allergy and blood work was never repeated.  She reintroduced red meat on her own at home and has been eating it a few times per week without any issues.  She also had steak the night before this reaction. Denies any changes in diet, meds or personal care products. Denies recent tick bites.   Discussed with patient that is possible that her alpha gal allergy returned or she developed an allergy to something new.  Continue to avoid red meat - see handout.   Avoid the following for now: cole slaw, mustard, ketchup, pickle. o If the bloodwork is negative will reintroduce back into the diet at home.   Get bloodwork   For mild symptoms you can take over the counter antihistamines such as Benadryl and monitor symptoms closely. If symptoms worsen or if you have severe symptoms including breathing issues, throat closure, significant swelling, whole body hives, severe diarrhea and vomiting, lightheadedness then inject epinephrine and seek immediate medical care afterwards.  Action plan given.

## 2021-09-22 NOTE — Patient Instructions (Addendum)
Alpha gal allergy/allergic reaction Continue to avoid red meat - see handout.  Avoid the following for now: cole slaw, mustard, ketchup, pickle. If the bloodwork is negative will have you reintroduce it at home.  Get bloodwork  We are ordering labs, so please allow 1-2 weeks for the results to come back. With the newly implemented Cures Act, the labs might be visible to you at the same time that they become visible to me. However, I will not address the results until all of the results are back, so please be patient.  In the meantime, continue recommendations in your patient instructions, including avoidance measures (if applicable), until you hear from me. For mild symptoms you can take over the counter antihistamines such as Benadryl and monitor symptoms closely. If symptoms worsen or if you have severe symptoms including breathing issues, throat closure, significant swelling, whole body hives, severe diarrhea and vomiting, lightheadedness then inject epinephrine and seek immediate medical care afterwards. Action plan given.  Environmental allergies 2020 skin testing was positive to grass, ragweed, weed, trees, mold, dust mites, cockroach. Continue environmental control measures.  Use over the counter antihistamines such as Zyrtec (cetirizine), Claritin (loratadine), Allegra (fexofenadine), or Xyzal (levocetirizine) daily as needed. May take twice a day during allergy flares. May switch antihistamines every few months. Use Flonase (fluticasone) nasal spray 1 spray per nostril twice a day as needed for nasal congestion.  Nasal saline spray (i.e., Simply Saline) or nasal saline lavage (i.e., NeilMed) is recommended as needed and prior to medicated nasal sprays.  Follow up in 6-12 months or sooner if needed.

## 2021-09-27 LAB — COMPREHENSIVE METABOLIC PANEL
ALT: 21 IU/L (ref 0–32)
AST: 19 IU/L (ref 0–40)
Albumin/Globulin Ratio: 1.4 (ref 1.2–2.2)
Albumin: 3.5 g/dL — ABNORMAL LOW (ref 3.8–4.8)
Alkaline Phosphatase: 112 IU/L (ref 44–121)
BUN/Creatinine Ratio: 20 (ref 9–23)
BUN: 13 mg/dL (ref 6–24)
Bilirubin Total: 0.3 mg/dL (ref 0.0–1.2)
CO2: 22 mmol/L (ref 20–29)
Calcium: 8.4 mg/dL — ABNORMAL LOW (ref 8.7–10.2)
Chloride: 96 mmol/L (ref 96–106)
Creatinine, Ser: 0.66 mg/dL (ref 0.57–1.00)
Globulin, Total: 2.5 g/dL (ref 1.5–4.5)
Glucose: 309 mg/dL — ABNORMAL HIGH (ref 70–99)
Potassium: 3.8 mmol/L (ref 3.5–5.2)
Sodium: 136 mmol/L (ref 134–144)
Total Protein: 6 g/dL (ref 6.0–8.5)
eGFR: 107 mL/min/{1.73_m2} (ref >59)

## 2021-09-27 LAB — ALPHA-GAL PANEL
Allergen Lamb IgE: 4 kU/L — AB
Beef IgE: 8.51 kU/L — AB
IgE (Immunoglobulin E), Serum: 272 IU/mL (ref 6–495)
O215-IgE Alpha-Gal: 20.5 kU/L — AB
Pork IgE: 3.51 kU/L — AB

## 2021-09-27 LAB — CBC WITH DIFFERENTIAL/PLATELET
Basophils Absolute: 0.1 10*3/uL (ref 0.0–0.2)
Basos: 1 %
EOS (ABSOLUTE): 0.2 10*3/uL (ref 0.0–0.4)
Eos: 2 %
Hematocrit: 48.5 % — ABNORMAL HIGH (ref 34.0–46.6)
Hemoglobin: 16.4 g/dL — ABNORMAL HIGH (ref 11.1–15.9)
Immature Grans (Abs): 0 10*3/uL (ref 0.0–0.1)
Immature Granulocytes: 0 %
Lymphocytes Absolute: 2.7 10*3/uL (ref 0.7–3.1)
Lymphs: 38 %
MCH: 32.9 pg (ref 26.6–33.0)
MCHC: 33.8 g/dL (ref 31.5–35.7)
MCV: 97 fL (ref 79–97)
Monocytes Absolute: 0.6 10*3/uL (ref 0.1–0.9)
Monocytes: 8 %
Neutrophils Absolute: 3.5 10*3/uL (ref 1.4–7.0)
Neutrophils: 51 %
Platelets: 179 10*3/uL (ref 150–450)
RBC: 4.99 x10E6/uL (ref 3.77–5.28)
RDW: 12 % (ref 11.7–15.4)
WBC: 7 10*3/uL (ref 3.4–10.8)

## 2021-09-27 LAB — FOOD ALLERGY PROFILE
Allergen Corn, IgE: <0.1 kU/L
Clam IgE: <0.1 kU/L
Codfish IgE: <0.1 kU/L
Egg White IgE: <0.1 kU/L
Milk IgE: 1.13 kU/L — AB
Peanut IgE: <0.1 kU/L
Scallop IgE: <0.1 kU/L
Sesame Seed IgE: <0.1 kU/L
Shrimp IgE: <0.1 kU/L
Soybean IgE: <0.1 kU/L
Walnut IgE: <0.1 kU/L
Wheat IgE: <0.1 kU/L

## 2021-09-27 LAB — THYROID CASCADE PROFILE: TSH: 2 u[IU]/mL (ref 0.450–4.500)

## 2021-09-27 LAB — SEDIMENTATION RATE: Sed Rate: 29 mm/hr (ref 0–32)

## 2021-09-27 LAB — ALLERGEN, MUSTARD, F89: F089-IgE Mustard: <0.1 kU/L

## 2021-09-27 LAB — C3 AND C4
Complement C3, Serum: 137 mg/dL (ref 82–167)
Complement C4, Serum: 22 mg/dL (ref 12–38)

## 2021-09-27 LAB — ALLERGEN, CUCUMBER, F244: Allergen Cucumber IgE: <0.1 kU/L

## 2021-09-27 LAB — ANA W/REFLEX: Anti Nuclear Antibody (ANA): NEGATIVE

## 2021-09-27 LAB — ALLERGEN, TOMATO F25: Allergen Tomato, IgE: <0.1 kU/L

## 2021-09-27 LAB — TRYPTASE: Tryptase: 11.3 ug/L (ref 2.2–13.2)

## 2021-09-27 LAB — ALLERGEN, CABBAGE, F216: Allergen Cabbage IgE: <0.1 kU/L

## 2021-11-05 DIAGNOSIS — F17200 Nicotine dependence, unspecified, uncomplicated: Secondary | ICD-10-CM | POA: Insufficient documentation

## 2021-11-05 DIAGNOSIS — R079 Chest pain, unspecified: Secondary | ICD-10-CM | POA: Insufficient documentation

## 2021-11-05 DIAGNOSIS — E871 Hypo-osmolality and hyponatremia: Secondary | ICD-10-CM | POA: Insufficient documentation

## 2021-11-19 DIAGNOSIS — E785 Hyperlipidemia, unspecified: Secondary | ICD-10-CM | POA: Insufficient documentation

## 2021-11-19 DIAGNOSIS — I2510 Atherosclerotic heart disease of native coronary artery without angina pectoris: Secondary | ICD-10-CM | POA: Insufficient documentation

## 2021-11-30 ENCOUNTER — Telehealth: Payer: Self-pay | Admitting: Cardiology

## 2021-11-30 NOTE — Telephone Encounter (Signed)
Patient had MI and stents placed at [facility] in March and wants to be scheduled to see Dr. Gardiner Rhyme. Scheduled for 5/12. ?

## 2021-11-30 NOTE — Telephone Encounter (Signed)
Patient states she had a heart attack 3 weeks ago. She says she went to [facility] and then was transferred to [place]. She would like her notes reviewed and to see how soon she can be worked in.  ?
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
## 2021-12-03 ENCOUNTER — Encounter: Payer: Self-pay | Admitting: Cardiology

## 2021-12-03 ENCOUNTER — Ambulatory Visit (INDEPENDENT_AMBULATORY_CARE_PROVIDER_SITE_OTHER): Payer: Medicare HMO | Admitting: Cardiology

## 2021-12-03 VITALS — BP 118/68 | HR 73 | Ht 62.0 in | Wt 205.0 lb

## 2021-12-03 DIAGNOSIS — Z794 Long term (current) use of insulin: Secondary | ICD-10-CM

## 2021-12-03 DIAGNOSIS — I25118 Atherosclerotic heart disease of native coronary artery with other forms of angina pectoris: Secondary | ICD-10-CM | POA: Diagnosis not present

## 2021-12-03 DIAGNOSIS — E1169 Type 2 diabetes mellitus with other specified complication: Secondary | ICD-10-CM

## 2021-12-03 DIAGNOSIS — Z720 Tobacco use: Secondary | ICD-10-CM

## 2021-12-03 DIAGNOSIS — I739 Peripheral vascular disease, unspecified: Secondary | ICD-10-CM

## 2021-12-03 DIAGNOSIS — R002 Palpitations: Secondary | ICD-10-CM | POA: Diagnosis not present

## 2021-12-03 DIAGNOSIS — E785 Hyperlipidemia, unspecified: Secondary | ICD-10-CM | POA: Diagnosis not present

## 2021-12-03 MED ORDER — NITROGLYCERIN 0.4 MG SL SUBL: 0.4000 mg | SUBLINGUAL_TABLET | SUBLINGUAL | 3 refills | Status: DC | PRN

## 2021-12-03 NOTE — Patient Instructions (Addendum)
Medication Instructions:  ?Your physician recommends that you continue on your current medications as directed. Please refer to the Current Medication list given to you today. ? ?*If you need a refill on your cardiac medications before your next appointment, please call your pharmacy* ? ? ?Lab Work: ?Please return for FASTING labs in 2 months (Lipid) ? ?Our in office lab hours are Monday-Friday 8:00-4:00, closed for lunch 12:45-1:45 pm.  No appointment needed. ? ?Follow-Up: ?At [facility], you and your health needs are our priority.  As part of our continuing mission to provide you with exceptional heart care, we have created designated Provider Care Teams.  These Care Teams include your primary Cardiologist (physician) and Advanced Practice Providers (APPs -  Physician Assistants and Nurse Practitioners) who all work together to provide you with the care you need, when you need it. ? ?We recommend signing up for the patient portal called "MyChart".  Sign up information is provided on this After Visit Summary.  MyChart is used to connect with patients for Virtual Visits (Telemedicine).  Patients are able to view lab/test results, encounter notes, upcoming appointments, etc.  Non-urgent messages can be sent to your provider as well.   ?To learn more about what you can do with MyChart, go to [email or web address].   ? ?Your next appointment:   ?3 month(s) ? ?The format for your next appointment:   ?In Person ? ?Provider:   ?Donato Heinz, MD { ? ? ?You have been referred to: Endocrinology ? ? ? ? ?

## 2021-12-14 ENCOUNTER — Other Ambulatory Visit: Payer: Self-pay | Admitting: *Deleted

## 2021-12-14 DIAGNOSIS — I739 Peripheral vascular disease, unspecified: Secondary | ICD-10-CM

## 2021-12-17 ENCOUNTER — Ambulatory Visit (INDEPENDENT_AMBULATORY_CARE_PROVIDER_SITE_OTHER): Payer: Medicare HMO | Admitting: Vascular Surgery

## 2021-12-17 ENCOUNTER — Ambulatory Visit (HOSPITAL_COMMUNITY)
Admission: RE | Admit: 2021-12-17 | Discharge: 2021-12-17 | Disposition: A | Discharge status: Home / Self Care | Payer: Medicare HMO | Source: Ambulatory Visit | Attending: Vascular Surgery | Admitting: Vascular Surgery

## 2021-12-17 ENCOUNTER — Encounter: Payer: Self-pay | Admitting: Vascular Surgery

## 2021-12-17 VITALS — BP 101/65 | HR 68 | Temp 98.0°F | Resp 20 | Ht 62.0 in | Wt 204.0 lb

## 2021-12-17 DIAGNOSIS — I739 Peripheral vascular disease, unspecified: Secondary | ICD-10-CM

## 2021-12-17 NOTE — Progress Notes (Signed)
? ? ?REASON FOR VISIT:  ? ?Follow-up of PAD ? ?MEDICAL ISSUES:  ? ?PERIPHERAL ARTERIAL DISEASE: This patient has stable claudication of the left leg.  She has infrainguinal arterial occlusive disease.  I encouraged her to stay as active as possible and continue walking.  She is good to start cardiac rehab which I think will be excellent.  We also discussed the importance of nutrition.  I congratulated her on quitting tobacco.  I ordered follow-up ABIs in 1 year and I will see her back at that time.  She knows to call sooner if she has problems. ? ?HPI:  ? ?Lisa Norton is a pleasant 51 y.o. female who I last saw on 12/24/2020.  She has peripheral arterial disease with infrainguinal arterial occlusive disease bilaterally.  Her symptoms are worse significant on the left side.  She had stable claudication.  At the time of her last visit we discussed the importance of tobacco cessation, nutrition, and exercise. ? ?Since I saw her last, she had a myocardial infarction in March and was treated in [place].  She had 3 stents placed.  She is getting ready to do cardiac rehab.  She does describe some left calf claudication.  Interestingly is mostly burning pain in her calf but it is brought on by ambulation and relieved with rest.  She has no symptoms on the right side.  She denies any hip or thigh claudication.  She denies any history of rest pain or nonhealing ulcers. ? ?She tells me that she quit smoking as that her heart attack scared her significantly.  She is also trying to lose weight. ? ? ?Past Medical History:  ?Diagnosis Date  ? Anxiety   ? COLD SORE 05/15/2010  ? DEPRESSION 12/12/2009  ? Diabetes mellitus without complication ([place])   ? High cholesterol   ? 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 HYPERTENSION 12/12/2009  ? Myocardial infarction [facility])   ? TOBACCO ABUSE 12/12/2009  ? ? ?Family History  ?Problem Relation Age of Onset  ? Heart disease Mother   ? Narcolepsy Mother   ? Bipolar disorder Sister   ?  Allergic rhinitis Neg Hx   ? Asthma Neg Hx   ? Eczema Neg Hx   ? Urticaria Neg Hx   ? Colon cancer Neg Hx   ? Esophageal cancer Neg Hx   ? Stomach cancer Neg Hx   ? Rectal cancer Neg Hx   ? ? ?SOCIAL HISTORY: ?Social History  ? ?Tobacco Use  ? Smoking status: Former  ?  Packs/day: 0.50  ?  Years: 20.00  ?  Pack years: 10.00  ?  Types: Cigarettes  ? Smokeless tobacco: Former  ?  Types: Snuff  ?  Quit date: 05/05/1986  ?Substance Use Topics  ? Alcohol use: Not Currently  ?  Comment: had a bout of heavy drinking 2015, drinks occasionally now   ? ? ?Allergies  ?Allergen Reactions  ? Metformin And Related Diarrhea  ? ? ?Current Outpatient Medications  ?Medication Sig Dispense Refill  ? Accu-Chek FastClix Lancets MISC 4 TIMES DAILY 102 each 5  ? amitriptyline (ELAVIL) 50 MG tablet Take by mouth.    ? aspirin EC 81 MG tablet Take 1 tablet (81 mg total) by mouth daily. 90 tablet 3  ? cetirizine (ZYRTEC) 10 MG tablet Take 1 tablet (10 mg total) by mouth daily. 30 tablet 11  ? dapagliflozin propanediol (FARXIGA) 10 MG TABS tablet Take 1 tablet (10 mg total) by mouth daily. (Needs to  be seen before next refill) 30 tablet 0  ? Dulaglutide (TRULICITY) 3 QZ/0.0PQ SOPN Inject into the skin.    ? EPINEPHrine (EPIPEN 2-PAK) 0.3 mg/0.3 mL IJ SOAJ injection Inject 0.3 mLs (0.3 mg total) into the muscle as needed for anaphylaxis. 2 Device 2  ? EPINEPHrine 0.3 mg/0.3 mL IJ SOAJ injection Inject into the skin.    ? fenofibrate (TRICOR) 145 MG tablet Take 145 mg by mouth daily.    ? fenofibrate (TRICOR) 48 MG tablet Take 48 mg by mouth daily.    ? fluticasone (FLONASE) 50 MCG/ACT nasal spray Place 2 sprays into both nostrils daily. 16 g 5  ? gabapentin (NEURONTIN) 400 MG capsule Take 400 mg by mouth daily.    ? glucose blood test strip Use to test BS QID and PRN dx.E11.65 100 each 12  ? metoprolol tartrate (LOPRESSOR) 25 MG tablet Take 25 mg by mouth 2 (two) times daily.    ? naloxone (NARCAN) nasal spray 4 mg/0.1 mL SMARTSIG:Both Nares     ? nitroGLYCERIN (NITROSTAT) 0.4 MG SL tablet Place 1 tablet (0.4 mg total) under the tongue every 5 (five) minutes as needed. 25 tablet 3  ? omeprazole (PRILOSEC) 40 MG capsule     ? ondansetron (ZOFRAN) 4 MG tablet Take 1 tablet (4 mg total) by mouth every 6 (six) hours as needed for nausea or vomiting. 50 tablet 1  ? oxyCODONE-acetaminophen (PERCOCET) 5-325 MG tablet Take 1 tablet by mouth every 6 (six) hours as needed for severe pain. 30 tablet 0  ? topiramate (TOPAMAX) 25 MG tablet Take 25 mg by mouth 2 (two) times daily.    ? ULTICARE MICRO PEN NEEDLES 32G X 4 MM MISC 3 TIMES DAILY 100 each 11  ? valACYclovir (VALTREX) 1000 MG tablet Take 1 tablet (1,000 mg total) by mouth 2 (two) times daily. 20 tablet 6  ? atorvastatin (LIPITOR) 80 MG tablet Take 1 tablet (80 mg total) by mouth daily. 90 tablet 3  ? ezetimibe (ZETIA) 10 MG tablet Take 1 tablet (10 mg total) by mouth daily. 90 tablet 3  ? ?No current facility-administered medications for this visit.  ? ? ?REVIEW OF SYSTEMS:  ?'[X]'$  denotes positive finding, '[ ]'$  denotes negative finding ?Cardiac  Comments:  ?Chest pain or chest pressure:    ?Shortness of breath upon exertion: x   ?Short of breath when lying flat: x   ?Irregular heart rhythm:    ?    ?Vascular    ?Pain in calf, thigh, or hip brought on by ambulation: x   ?Pain in feet at night that wakes you up from your sleep:     ?Blood clot in your veins:    ?Leg swelling:     ?    ?Pulmonary    ?Oxygen at home:    ?Productive cough:     ?Wheezing:     ?    ?Neurologic    ?Sudden weakness in arms or legs:     ?Sudden numbness in arms or legs:     ?Sudden onset of difficulty speaking or slurred speech:    ?Temporary loss of vision in one eye:     ?Problems with dizziness:     ?    ?Gastrointestinal    ?Blood in stool:     ?Vomited blood:     ?    ?Genitourinary    ?Burning when urinating:     ?Blood in urine:    ?    ?Psychiatric    ?  Major depression:  x   ?    ?Hematologic    ?Bleeding problems:    ?Problems  with blood clotting too easily:    ?    ?Skin    ?Rashes or ulcers:    ?    ?Constitutional    ?Fever or chills:    ? ?PHYSICAL EXAM:  ? ?Vitals:  ? 12/17/21 1141  ?BP: 101/65  ?Pulse: 68  ?Resp: 20  ?Temp: 98 ?F (36.7 ?C)  ?SpO2: 95%  ?Weight: 204 lb (92.5 kg)  ?Height: '5\' 2"'$  (1.575 m)  ? ? ?GENERAL: The patient is a well-nourished female, in no acute distress. The vital signs are documented above. ?CARDIAC: There is a regular rate and rhythm.  ?VASCULAR: I do not detect carotid bruits ?She has palpable femoral pulses. ?She has a palpable right dorsalis pedis pulse.  I cannot palpate pulses in the left foot. ?PULMONARY: There is good air exchange bilaterally without wheezing or rales. ?ABDOMEN: Soft and non-tender with normal pitched bowel sounds.  ?MUSCULOSKELETAL: There are no major deformities or cyanosis. ?NEUROLOGIC: No focal weakness or paresthesias are detected. ?SKIN: There are no ulcers or rashes noted. ?PSYCHIATRIC: The patient has a normal affect. ? ?DATA:   ? ?ARTERIAL DOPPLER STUDY: I did independently interpreted her arterial Doppler study today. ? ?On the right side there is a monophasic dorsalis pedis and posterior tibial signal.  ABI is 96% although this may be falsely elevated.  Toe pressure is 90 mmHg. ? ?On the left side there is a monophasic dorsalis pedis and posterior tibial signal.  ABI 74%.  Toe pressures 45 mmHg.  6 ? ?Lisa Norton ?[facility] ?Office [phone] ?

## 2022-01-08 ENCOUNTER — Ambulatory Visit: Payer: Medicare HMO | Admitting: Cardiology

## 2022-01-30 LAB — LIPID PANEL
Chol/HDL Ratio: 7.6 ratio — ABNORMAL HIGH (ref 0.0–4.4)
Cholesterol, Total: 106 mg/dL (ref 100–199)
HDL: 14 mg/dL — ABNORMAL LOW (ref >39)
LDL Chol Calc (NIH): 25 mg/dL (ref 0–99)
Triglycerides: 489 mg/dL — ABNORMAL HIGH (ref 0–149)
VLDL Cholesterol Cal: 67 mg/dL — ABNORMAL HIGH (ref 5–40)

## 2022-02-04 ENCOUNTER — Encounter: Payer: Self-pay | Admitting: Cardiology

## 2022-02-04 DIAGNOSIS — E785 Hyperlipidemia, unspecified: Secondary | ICD-10-CM

## 2022-02-15 NOTE — Telephone Encounter (Signed)
Recommend establishing with pharmacy lipid clinic

## 2022-02-15 NOTE — Telephone Encounter (Signed)
Triglycerides are still elevated, we had prescribed Vascepa but I do not see this on her med list, is she taking this?

## 2022-02-16 ENCOUNTER — Ambulatory Visit (INDEPENDENT_AMBULATORY_CARE_PROVIDER_SITE_OTHER): Payer: Medicare HMO | Admitting: Pharmacist

## 2022-02-16 ENCOUNTER — Encounter: Payer: Self-pay | Admitting: Pharmacist

## 2022-02-16 VITALS — BP 102/71 | HR 72 | Wt 207.2 lb

## 2022-02-16 DIAGNOSIS — E785 Hyperlipidemia, unspecified: Secondary | ICD-10-CM | POA: Diagnosis not present

## 2022-02-16 DIAGNOSIS — I25118 Atherosclerotic heart disease of native coronary artery with other forms of angina pectoris: Secondary | ICD-10-CM

## 2022-02-16 DIAGNOSIS — E1169 Type 2 diabetes mellitus with other specified complication: Secondary | ICD-10-CM

## 2022-02-16 DIAGNOSIS — F17200 Nicotine dependence, unspecified, uncomplicated: Secondary | ICD-10-CM

## 2022-02-16 MED ORDER — VARENICLINE TARTRATE 1 MG PO TABS: 1.0000 mg | ORAL_TABLET | Freq: Two times a day (BID) | ORAL | 5 refills | Status: AC

## 2022-02-16 MED ORDER — ICOSAPENT ETHYL 1 G PO CAPS: 2.0000 g | ORAL_CAPSULE | Freq: Two times a day (BID) | ORAL | 5 refills | Status: AC

## 2022-02-16 MED ORDER — CHANTIX STARTING MONTH PAK 0.5 MG X 11 & 1 MG X 42 PO TBPK: ORAL_TABLET | ORAL | 0 refills | Status: DC

## 2022-02-16 MED ORDER — PRALUENT 75 MG/ML ~~LOC~~ SOAJ: 1.0000 mL | SUBCUTANEOUS | 3 refills | Status: DC

## 2022-02-16 NOTE — Patient Instructions (Addendum)
It was nice meeting you this morning  We would like to keep your LDL (bad cholesterol) less than 55 and your triglycerides less than 150  I will do a prior authorization for Praluent for you.  It works the same way as the [place] and is administered the same way.  I will contact you when it is approved  I have increased your dose of Vascepa to 2 grams twice a day  Please continue your fenofibrate '145mg'$  daily  I have also refilled your Chantix for you  Please call me with any questions  Karren Cobble, PharmD, [place], [place], [place], [place], [place], [address] Phone: [phone], Fax: [phone]

## 2022-02-16 NOTE — Progress Notes (Unsigned)
Patient ID: Lisa Norton                 DOB: [birth year]                    MRN: [id number]     HPI: Lisa Norton is a 51 y.o. female patient referred to lipid clinic by Dr Gardiner Rhyme. PMH is significant for uncontrolled T2dm, HLD, HTN, CAD, obesity, tobacco use and depression.  Patient presents today to discuss lipid levels. Is frustrated by her health. Reports that her health has been poor ever since her heart attack and she has trouble getting to rehab due to travel distance.  Triglycerides had previously been very elevated although she has decreased them from 851 to 489 in past 3 months. Currently being treated with fenofibrate and Vascepa 1gram daily.  Is currently taking Repatha '140mg'$  sq every 2 weeks. Is relying on samples since PA was not approved.  Blood sugar is uncontrolled. Most recent A1c 9.9 and fasting blood sugars routinely >200.  Currently on Lantus, Trulicity and [place]. Has had recurrent yeast infections all year. Lantus was recently increased to 35 units but she reports it has not made much difference.  Has started smoking again and is now up to 3/4ths a pack a day.  Blames it on stress and depression due to death of daughter.  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      amoxicillin-clavulanate (AUGMENTIN) 875-125 MG tablet Take 1 tablet by mouth 2 (two) times daily.     aspirin EC 81 MG tablet Take 1 tablet (81 mg total) by mouth daily. 90 tablet 3   atorvastatin (LIPITOR) 80 MG tablet Take 1 tablet (80 mg total) by mouth daily. 90 tablet 3   BRILINTA 90 MG TABS tablet Take 90 mg by mouth 2 (two) times daily.     cetirizine (ZYRTEC) 10 MG tablet Take 1 tablet (10 mg total) by mouth daily. 30 tablet 11   dapagliflozin propanediol (FARXIGA) 10 MG TABS tablet Take 1 tablet (10 mg total) by mouth daily. (Needs to be seen before next refill) 30 tablet 0   Dulaglutide (TRULICITY) 3 YQ/6.5HQ SOPN Inject into the skin.     EPINEPHrine 0.3 mg/0.3 mL IJ SOAJ injection Inject into the skin.     Evolocumab 140 MG/ML SOSY Inject into the skin.     ezetimibe (ZETIA) 10 MG tablet Take 1 tablet (10 mg total) by mouth daily. 90 tablet 3   fenofibrate (TRICOR) 145 MG tablet Take 145 mg by mouth daily.     fluconazole (DIFLUCAN) 150 MG tablet Take 150 mg by mouth once.     fluticasone (FLONASE) 50 MCG/ACT nasal spray Place 2 sprays into both nostrils daily. 16 g  5   gabapentin (NEURONTIN) 400 MG capsule Take 400 mg by mouth daily.     glucose blood test strip Use to test BS QID and PRN dx.E11.65 100 each 12   icosapent Ethyl (VASCEPA) 1 g capsule Take 2 g by mouth daily.     isosorbide mononitrate (IMDUR) 30 MG 24 hr tablet Take 30 mg by mouth daily.     LANTUS SOLOSTAR 100 UNIT/ML Solostar Pen Inject into the skin.     metoprolol tartrate (LOPRESSOR) 25 MG tablet Take 25 mg by mouth 2 (two) times daily.     mupirocin ointment (BACTROBAN) 2 % SMARTSIG:1 Application Topical 2-3 Times Daily     naloxone (NARCAN) nasal spray 4 mg/0.1 mL SMARTSIG:Both Nares     neomycin-polymyxin b-dexamethasone (MAXITROL) 3.5-10000-0.1 SUSP SMARTSIG:In Eye(s)     nitroGLYCERIN (NITROSTAT) 0.4 MG SL tablet Place 1 tablet (0.4 mg total) under the tongue every 5 (five) minutes as needed. 25 tablet 3    omeprazole (PRILOSEC) 40 MG capsule      ondansetron (ZOFRAN) 4 MG tablet Take 1 tablet (4 mg total) by mouth every 6 (six) hours as needed for nausea or vomiting. 50 tablet 1   oxyCODONE-acetaminophen (PERCOCET) 5-325 MG tablet Take 1 tablet by mouth every 6 (six) hours as needed for severe pain. 30 tablet 0   topiramate (TOPAMAX) 25 MG tablet Take 25 mg by mouth 2 (two) times daily.     ULTICARE MICRO PEN NEEDLES 32G X 4 MM MISC 3 TIMES DAILY 100 each 11   valACYclovir (VALTREX) 1000 MG tablet Take 1 tablet (1,000 mg total) by mouth 2 (two) times daily. 20 tablet 6   No current facility-administered medications on file prior to visit.    Allergies  Allergen Reactions   Metformin And Related Diarrhea    Assessment/Plan:  1. Hyperlipidemia - Patient current LDL 25 which is at goal of <55 on Repatha and atorvastatin.  Triglycerides very elevated however, likely due to uncontrolled DM.  Advised that it will be challenging to reduce her triglycerides while her blood sugar is elevated.  Vascepa is dose is not currently optimized.  Will increase to 2 grams twice daily and continue fenofibrate.  Advised controlling blood sugar will benefit her health in many aspects.  Recommended she hold [place] for now until her blood sugar comes down as it is the likely cause of her frequent yeast infections.  Likely needs insulin titration as well.  LDL at goal on Repatha, however PA likely not being approved since plan prefers Praluent until 02/27/22. Advised I will complete PA for her and contact her when approved.  Unfortunately she has started smoking again.  Was previously able to quit using Chantix and would like to try again.  Will send starting month pack and continuing month pack.  HLD: Continue fenofibrate '145mg'$  daily Increase Vascepa to 2 grams BID D/C Repatha samples Start Praluent '75mg'$  sq q 14 days  Smoking Cessation: Start Chantix  T2DM Continue Lantus 35 units daily Continue  Trulicity '3mg'$  sq weekly Hold Farxiga at this time   Karren Cobble, PharmD, [place], [place], [place], [place], [place], [address] Phone: [phone], Fax: [phone]
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
## 2022-03-09 ENCOUNTER — Ambulatory Visit: Payer: Medicare HMO | Admitting: Cardiology

## 2022-03-22 ENCOUNTER — Encounter: Payer: Self-pay | Admitting: Cardiology

## 2022-03-26 ENCOUNTER — Encounter: Payer: Self-pay | Admitting: Cardiology

## 2022-03-26 ENCOUNTER — Ambulatory Visit (INDEPENDENT_AMBULATORY_CARE_PROVIDER_SITE_OTHER): Payer: Medicare HMO | Admitting: Cardiology

## 2022-03-26 VITALS — BP 120/68 | HR 85 | Ht 62.0 in | Wt 204.8 lb

## 2022-03-26 DIAGNOSIS — E785 Hyperlipidemia, unspecified: Secondary | ICD-10-CM

## 2022-03-26 DIAGNOSIS — E1169 Type 2 diabetes mellitus with other specified complication: Secondary | ICD-10-CM

## 2022-03-26 DIAGNOSIS — I25118 Atherosclerotic heart disease of native coronary artery with other forms of angina pectoris: Secondary | ICD-10-CM | POA: Diagnosis not present

## 2022-03-26 DIAGNOSIS — R002 Palpitations: Secondary | ICD-10-CM | POA: Diagnosis not present

## 2022-03-26 DIAGNOSIS — Z720 Tobacco use: Secondary | ICD-10-CM

## 2022-03-26 DIAGNOSIS — I739 Peripheral vascular disease, unspecified: Secondary | ICD-10-CM | POA: Diagnosis not present
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
## 2022-03-26 NOTE — Patient Instructions (Signed)
Medication Instructions:  STOP zetia  CONTINUE current medications  *If you need a refill on your cardiac medications before your next appointment, please call your pharmacy*   Lab Work: FASTING lipid panel in 1 month   If you have labs (blood work) drawn today and your tests are completely normal, you will receive your results only by: [place] (if you have MyChart) OR A paper copy in the mail If you have any lab test that is abnormal or we need to change your treatment, we will call you to review the results.   Testing/Procedures: NONE   Follow-Up: At [facility], you and your health needs are our priority.  As part of our continuing mission to provide you with exceptional heart care, we have created designated Provider Care Teams.  These Care Teams include your primary Cardiologist (physician) and Advanced Practice Providers (APPs -  Physician Assistants and Nurse Practitioners) who all work together to provide you with the care you need, when you need it.  We recommend signing up for the patient portal called "MyChart".  Sign up information is provided on this After Visit Summary.  MyChart is used to connect with patients for Virtual Visits (Telemedicine).  Patients are able to view lab/test results, encounter notes, upcoming appointments, etc.  Non-urgent messages can be sent to your provider as well.   To learn more about what you can do with MyChart, go to [email or web address].    Your next appointment:   3 month(s)  The format for your next appointment:   In Person  Provider:   Donato Heinz, MD {

## 2022-04-16 ENCOUNTER — Ambulatory Visit: Payer: Medicare HMO

## 2022-04-29 ENCOUNTER — Encounter: Payer: Self-pay | Admitting: Pharmacist Clinician (PhC)/ Clinical Pharmacy Specialist

## 2022-04-29 ENCOUNTER — Ambulatory Visit: Payer: Medicare HMO | Attending: Cardiology | Admitting: Pharmacist Clinician (PhC)/ Clinical Pharmacy Specialist

## 2022-04-29 DIAGNOSIS — Z794 Long term (current) use of insulin: Secondary | ICD-10-CM | POA: Diagnosis not present

## 2022-04-29 DIAGNOSIS — E785 Hyperlipidemia, unspecified: Secondary | ICD-10-CM

## 2022-04-29 DIAGNOSIS — E1165 Type 2 diabetes mellitus with hyperglycemia: Secondary | ICD-10-CM | POA: Diagnosis not present

## 2022-04-29 NOTE — Assessment & Plan Note (Signed)
Patient with DM2, last A1c at 9.9.  Has since started on Ozempic, still at the 0.25 mg dose.  Reviewed dietary information to help keep GI distress down.  She notes home blood sugar readings are already starting to drop and is now getting 43% of readings in goal range (currently 80-180), and 17% above 300.   No changes to her medications.

## 2022-04-29 NOTE — Assessment & Plan Note (Signed)
Patient with mixed hyperlipidemia, LDL currently at goal, however patient is having apparent reactions to Praluent.  Will reach out to insurance company to switch to [place].  Patient would prefer the monthly Pushtronix device.  Will give her sample next week for her to try to be sure the reaction was to Praluent inactive ingredients/preservatives.  She will continue with Repatha Pushtronix once monthly.    Recently increased Vascepa from 1 capsule daily to 2 bid, because of elevated triglycerides.  She will repeat labs for that next week.

## 2022-04-29 NOTE — Progress Notes (Unsigned)
04/29/2022 DESA RE[facility] Jan 27, 1971 [id number]   HPI:  Lisa Norton is a 51 y.o. female patient of Lisa Norton, who presents today for a lipid clinic evaluation.  See pertinent past medical history below.  She was started on Praluent in late June and has had very good results, with LDL dropping to 25.  Unfortunately, the injections cause her to break out in a welt at the injection site.  She used 5 or 6 doses and the reaction continued with each.  She noted it was painful for a short time after the injection until the welt finally cleared - hours after the injection.    Past Medical History: CAD 3/23 NSTEMI DES x 3 to RCA  DM2 3/23 A1c 9.9 - on Ozempic, Lantus  HTN Controlled   obesity Has already dropped 5 pounds on Ozempic  DVT 09/30/19  - provoked - heel surgery 05/2019   Current Medications: Praluent 75 mg q14d, atorvastatin 80 mg qd, fenofibrate 145 mg qd, icosapent ethyl 2 gm bid  Cholesterol Goals: LDL < 55   Intolerant/previously tried: none  Family history: mother has heart disease, sister with bipolar disorder  Exercise: going to [employer], working in gym, and looking to start water aerobic classes  Diet: has been working on diet, cutting back and trying to be more aware of what she eats.  Gets nausea/vomiting with Ozempic if she eats too much  Labs: 01/29/22:  TC 106, TG 489 HDL 14, LDL 25   Current Outpatient Medications  Medication Sig Dispense Refill   Accu-Chek FastClix Lancets MISC 4 TIMES DAILY 102 each 5   Alirocumab (PRALUENT) 75 MG/ML SOAJ Inject 1 mL into the skin every 14 (fourteen) days. 6 mL 3   aspirin EC 81 MG tablet Take 1 tablet (81 mg total) by mouth daily. 90 tablet 3   atorvastatin (LIPITOR) 80 MG tablet Take 1 tablet (80 mg total) by mouth daily. 90 tablet 3   BRILINTA 90 MG TABS tablet Take 90 mg by mouth 2 (two) times daily.     cetirizine (ZYRTEC) 10 MG tablet Take 1 tablet (10 mg total) by mouth daily. 30 tablet 11    dapagliflozin propanediol (FARXIGA) 10 MG TABS tablet Take 1 tablet (10 mg total) by mouth daily. (Needs to be seen before next refill) 30 tablet 0   doxycycline (VIBRAMYCIN) 100 MG capsule Take 100 mg by mouth 2 (two) times daily.     Dulaglutide (TRULICITY) 3 GY/6.9SW SOPN Inject into the skin.     EPINEPHrine 0.3 mg/0.3 mL IJ SOAJ injection Inject into the skin.     fenofibrate (TRICOR) 145 MG tablet Take 145 mg by mouth daily.     fluconazole (DIFLUCAN) 150 MG tablet Take 150 mg by mouth once.     fluticasone (FLONASE) 50 MCG/ACT nasal spray Place 2 sprays into both nostrils daily. 16 g 5   gabapentin (NEURONTIN) 400 MG capsule Take 400 mg by mouth daily.     glucose blood test strip Use to test BS QID and PRN dx.E11.65 100 each 12   icosapent Ethyl (VASCEPA) 1 g capsule Take 2 capsules (2 g total) by mouth 2 (two) times daily. 120 capsule 5   isosorbide mononitrate (IMDUR) 30 MG 24 hr tablet Take 30 mg by mouth daily.     LANTUS SOLOSTAR 100 UNIT/ML Solostar Pen Inject into the skin.     metoprolol tartrate (LOPRESSOR) 25 MG tablet Take 25 mg by mouth 2 (two)  times daily.     mupirocin ointment (BACTROBAN) 2 % SMARTSIG:1 Application Topical 2-3 Times Daily     naloxone (NARCAN) nasal spray 4 mg/0.1 mL SMARTSIG:Both Nares     neomycin-polymyxin b-dexamethasone (MAXITROL) 3.5-10000-0.1 SUSP SMARTSIG:In Eye(s)     nitroGLYCERIN (NITROSTAT) 0.4 MG SL tablet Place 1 tablet (0.4 mg total) under the tongue every 5 (five) minutes as needed. 25 tablet 3   omeprazole (PRILOSEC) 40 MG capsule      ondansetron (ZOFRAN) 4 MG tablet Take 1 tablet (4 mg total) by mouth every 6 (six) hours as needed for nausea or vomiting. 50 tablet 1   oxyCODONE-acetaminophen (PERCOCET) 5-325 MG tablet Take 1 tablet by mouth every 6 (six) hours as needed for severe pain. 30 tablet 0   topiramate (TOPAMAX) 25 MG tablet Take 25 mg by mouth 2 (two) times daily.     ULTICARE MICRO PEN NEEDLES 32G X 4 MM MISC 3 TIMES DAILY 100  each 11   valACYclovir (VALTREX) 1000 MG tablet Take 1 tablet (1,000 mg total) by mouth 2 (two) times daily. 20 tablet 6   varenicline (CHANTIX) 1 MG tablet Take 1 tablet (1 mg total) by mouth 2 (two) times daily. 60 tablet 5   Varenicline Tartrate, Starter, (CHANTIX STARTING MONTH PAK) 0.5 MG X 11 & 1 MG X 42 TBPK Take 0.'5mg'$  once daily for 3 days, then take 0.'5mg'$  twice a day for days 4 to 7, then take '1mg'$  twice a day 1 each 0   No current facility-administered medications for this visit.    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   Dyslipidemia Patient with mixed hyperlipidemia, LDL currently at goal, however patient is having apparent reactions to Praluent.  Will reach out to insurance company to switch to [place].  Patient would prefer the monthly Pushtronix device.  Will give her sample next week for her to try to be sure the reaction was to Praluent inactive ingredients/preservatives.  She will continue with Repatha Pushtronix once monthly.    Recently increased Vascepa from 1 capsule daily to 2 bid, because of elevated triglycerides.  She will repeat labs for that next week.   Type 2 diabetes mellitus Patient with DM2, last A1c at 9.9.  Has since started on Ozempic, still at the 0.25 mg dose.  Reviewed dietary information to help keep GI distress down.  She notes home blood sugar readings are already starting to drop and is now getting 43% of readings in goal range (currently 80-180), and 17% above 300.   No changes to her medications.    Tommy Medal PharmD CPP [place] [address] [place], [place]  [id number] [phone]

## 2022-04-29 NOTE — Patient Instructions (Signed)
Thank you for choosing [facility]  Your Results:             Your most recent labs Goal  Total Cholesterol 106 < 200  Triglycerides 489 < 150  HDL (happy/good cholesterol) 14 > 40  LDL (lousy/bad cholesterol 25 < 55   Medication changes:  We will start the process to get Repatha Pushtronix covered by your insurance.  Once the prior authorization is complete, I will call you to let you know and confirm pharmacy information.   You will use 1 Pushtronix device each month.  Lab orders:  We want to repeat labs after 2-3 months.  We will send you a lab order to remind you once we get closer to that time.    OZEMPIC TIPS FOR SUCCESS Write down the reasons why you want to lose weight and post it in a place where you'll see it often. Start small and work your way up. Keep in mind that it takes time to achieve goals, and small steps add up. Any additional movements help to burn calories. Taking the stairs rather than the elevator and parking at the far end of your parking lot are easy ways to start. Brisk walking for at least 30 minutes 4 or more days of the week is an excellent goal to work toward  [place] Did you know that it can take 15 minutes or more for your brain to receive the message that you've eaten? That means that, if you eat less food, but consume it slower, you may still feel satisfied. Eating a lot of fruits and vegetables can also help you feel fuller. Eat off of smaller plates so that moderate portions don't seem too small    If you have any questions or concerns, please reach out to [place].  Wilburta Milbourn/Chris at [phone].  [place]

## 2022-05-20 LAB — LIPID PANEL
Chol/HDL Ratio: 11.8 ratio — ABNORMAL HIGH (ref 0.0–4.4)
Cholesterol, Total: 225 mg/dL — ABNORMAL HIGH (ref 100–199)
HDL: 19 mg/dL — ABNORMAL LOW (ref >39)
LDL Chol Calc (NIH): 146 mg/dL — ABNORMAL HIGH (ref 0–99)
Triglycerides: 322 mg/dL — ABNORMAL HIGH (ref 0–149)
VLDL Cholesterol Cal: 60 mg/dL — ABNORMAL HIGH (ref 5–40)

## 2022-05-21 ENCOUNTER — Telehealth: Payer: Self-pay | Admitting: Pharmacist

## 2022-05-21 ENCOUNTER — Telehealth: Payer: Self-pay | Admitting: Cardiology

## 2022-05-21 DIAGNOSIS — I2510 Atherosclerotic heart disease of native coronary artery without angina pectoris: Secondary | ICD-10-CM

## 2022-05-21 DIAGNOSIS — E1169 Type 2 diabetes mellitus with other specified complication: Secondary | ICD-10-CM

## 2022-05-21 MED ORDER — REPATHA PUSHTRONEX SYSTEM 420 MG/3.5ML ~~LOC~~ SOCT: 3.5000 mL | SUBCUTANEOUS | 5 refills | Status: DC

## 2022-05-21 NOTE — Telephone Encounter (Signed)
Returned call to patient who states she was wanting to know if her authorization was approved for her [place] [employer]. Advised patient per chart review the authorization was approved and medication was sent to her pharmacy this morning.   Patient would like to know if she can have pharmacy call her to go over the instructions for using the pushtronix again and she does not remember how to do this. Advised patient I would forward message over for review. Patient verbalized understanding.

## 2022-05-21 NOTE — Telephone Encounter (Signed)
Patient called to follow-up with Dr. Brion Aliment regarding her Pushtronix application.

## 2022-05-21 NOTE — Telephone Encounter (Signed)
Repatha PA approved through 08/29/22.

## 2022-06-02 ENCOUNTER — Encounter: Payer: Self-pay | Admitting: Pharmacist Clinician (PhC)/ Clinical Pharmacy Specialist

## 2022-06-30 ENCOUNTER — Ambulatory Visit: Payer: Medicare HMO | Admitting: Cardiology

## 2022-08-02 ENCOUNTER — Ambulatory Visit: Payer: Medicare HMO | Admitting: Cardiology

## 2022-09-12 ENCOUNTER — Other Ambulatory Visit: Payer: Self-pay | Admitting: Pharmacist Clinician (PhC)/ Clinical Pharmacy Specialist

## 2022-09-12 ENCOUNTER — Encounter: Payer: Self-pay | Admitting: Pharmacist Clinician (PhC)/ Clinical Pharmacy Specialist

## 2022-09-12 DIAGNOSIS — E1169 Type 2 diabetes mellitus with other specified complication: Secondary | ICD-10-CM

## 2022-09-20 ENCOUNTER — Other Ambulatory Visit (HOSPITAL_COMMUNITY): Payer: Self-pay

## 2022-09-20 ENCOUNTER — Ambulatory Visit: Payer: Medicare HMO | Attending: Cardiology | Admitting: Cardiology
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
## 2022-09-29 NOTE — Progress Notes (Signed)
Cardiology Clinic Note   Patient Name: Lisa Norton Date of Encounter: 10/01/2022  Primary Care Provider:  Valinda Hoar, [place] Primary Cardiologist:  Donato Heinz, MD  Patient Profile    Lisa Norton is a 52 y.o. female with a past medical history of CAD s/p PCI with DES x 3 to RCA March 2023, PAD, palpitations, hypertension, hyperlipidemia, T2DM, tobacco abuse, history of DVT left lower extremity 2021 now resolved who presents to the clinic today for 3 month follow up.   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   HYPERTENSION 12/12/2009   Myocardial infarction [facility])    TOBACCO ABUSE 12/12/2009   Past Surgical History:  Procedure Laterality Date   BLADDER SUSPENSION     CERVICAL FUSION     CESAREAN SECTION WITH BILATERAL TUBAL LIGATION Bilateral 02/12/2015   Procedure: CESAREAN SECTION;  Surgeon: Florian Buff, MD;  Location: [facility];  Service: Obstetrics;  Laterality: Bilateral;  Fetal Demise   CHOLECYSTECTOMY  2006   CORONARY ANGIOPLASTY WITH STENT PLACEMENT     EVALUATION UNDER ANESTHESIA WITH HEMORRHOIDECTOMY N/A 10/12/2018   Procedure: LATERAL INTERNAL HEMORRHOID LIGATION/PEXY ANORECTAL EXAM UNDER ANESTHESIA WITH HEMORRHOIDECTOMY X2;  Surgeon: Michael Boston, MD;  Location: [facility];  Service: General;  Laterality: N/A;   FOOT SURGERY Right    Plantar, bone spurs   FOOT SURGERY Bilateral 2019 and 2020   11/2018, 05/31/2019   LAPAROSCOPIC VAGINAL HYSTERECTOMY WITH SALPINGECTOMY  02/16/2018   [facility]   TONSILLECTOMY  1988    Allergies  Allergies  Allergen Reactions   Metformin And Related Diarrhea    History of Present Illness    Lisa Norton has a past medical history of: CAD.  LHC performed at [facility]  11/06/2021 (NSTEMI): Mid RCA 99%, distal RCA 100%. PCI with DES x 3 mid to distal RCA (distal 2.25 x 20 mm, mid 2.25 x 24 mm, 2.25 x 20 mm -stents to mid RCA are not overlapping). Palpitations.  14-day ZIO 08/13/2021: Predominant underlying rhythm was sinus rhythm.  No significant arrhythmia.  15 patient triggered events corresponded with sinus rhythm with/without PACs. PAD. Vascular US ABI 12/17/2021: Right: Resting right ABI within normal range, may be falsely elevated due to calcified arteries. Right TBI normal. Left: Resting left ABI indicates moderate left lower extremity arterial disease. The left TBI is abnormal. Unchanged from previous.  Followed by vascular surgery. Hypertension. Hyperlipidemia. Lipid panel 05/19/2022: LDL 146, HDL 19, triglycerides 322, total 225. T2DM. Tobacco abuse.  OSA. CPAP. History of postoperative DVT 2021. Vascular US left lower extremity 09/12/2019:Acute DVT involving left peroneal veins. Incidental finding: left mid SFA appears occluded with reconstitution of flow in distal SFA.   Ms. Cobert was first evaluated by Dr. Gardiner Rhyme on 09/12/2019 for leg pain.  She is followed for the history outlined above.  She was last seen in the office by Dr. Gardiner Rhyme on 03/26/2022 for follow-up after LHC at [facility] for NSTEMI.  Patient was unable to participate in cardiac rehab but had been on an exercise program at the [facility].  Patient continued to smoke at that time.  She had recently started Chantix and is hoping to quit.  Today, patient reports she is doing well. Patient denies shortness  of breath or dyspnea on exertion. No chest pain, pressure, or tightness. Denies lower extremity edema, orthopnea, or PND. No palpitations. She was recently seen in the emergency department at [facility] for chest pain on 09/26/2022. She presented to the ED with a 2 hour history of left sided chest pain described as severe heart burn and similar to previous pain with MI.  She took 2 NTG at home along with pepto with a slight relief. She was hypertensive at 181/94. She was given a GI cocktail and discharged home. She states she remained in pain until the next day when pain resolved on its own. She has not had any episodes of pain since that time. She is active in her home caring for her grandbaby and has recently been moving furniture without pain or dyspnea. She denies lower extremity edema, abdominal bloating/fullness, orthopnea or PND. She continues to smoke half a pack of cigarettes a day. She started on Repatha 3 months ago and missed one dose.    Home Medications    Current Meds  Medication Sig   Accu-Chek FastClix Lancets MISC 4 TIMES DAILY   aspirin EC 81 MG tablet Take 1 tablet (81 mg total) by mouth daily.   atorvastatin (LIPITOR) 80 MG tablet Take 1 tablet (80 mg total) by mouth daily.   BRILINTA 90 MG TABS tablet Take 90 mg by mouth 2 (two) times daily.   doxycycline (VIBRAMYCIN) 100 MG capsule Take 100 mg by mouth 2 (two) times daily.   Evolocumab with Infusor ([place]) 420 MG/3.5ML SOCT Inject 3.5 mLs into the skin every 30 (thirty) days.   famotidine (PEPCID) 40 MG tablet Take 40 mg by mouth daily.   fenofibrate (TRICOR) 145 MG tablet Take 145 mg by mouth daily.   gabapentin (NEURONTIN) 400 MG capsule Take 400 mg by mouth daily.   glucose blood test strip Use to test BS QID and PRN dx.E11.65   icosapent Ethyl (VASCEPA) 1 g capsule Take 2 capsules (2 g total) by mouth 2 (two) times daily.   insulin degludec (TRESIBA) 200 UNIT/ML FlexTouch Pen Inject 50 Units into the skin in the morning.   isosorbide mononitrate (IMDUR) 30 MG 24 hr tablet Take 30 mg by mouth daily.   LANTUS SOLOSTAR 100 UNIT/ML Solostar Pen Inject into the skin.   metoprolol tartrate (LOPRESSOR) 25 MG tablet Take 25 mg by mouth 2 (two) times daily.   nitroGLYCERIN (NITROSTAT) 0.4 MG SL tablet Place 1 tablet (0.4 mg total) under the tongue every 5 (five) minutes as  needed.   oxyCODONE-acetaminophen (PERCOCET) 5-325 MG tablet Take 1 tablet by mouth every 6 (six) hours as needed for severe pain.   OZEMPIC, 1 MG/DOSE, 4 MG/3ML SOPN Inject 1 mg into the skin once a week.   ULTICARE MICRO PEN NEEDLES 32G X 4 MM MISC 3 TIMES DAILY   varenicline (CHANTIX) 1 MG tablet Take 1 tablet (1 mg total) by mouth 2 (two) times daily.    Family History    Family History  Problem Relation Age of Onset   Heart disease Mother    Narcolepsy Mother    Bipolar disorder Sister    Allergic rhinitis Neg Hx    Asthma Neg Hx    Eczema Neg Hx    Urticaria Neg Hx    Colon cancer Neg Hx    Esophageal cancer Neg Hx    Stomach cancer Neg Hx    Rectal cancer Neg Hx    She indicated  that her mother is alive. She indicated that the status of her sister is unknown. She indicated that the status of her neg hx is unknown.   Social History    Social History   Socioeconomic History   Marital status: Divorced    Spouse name: Not on file   Number of children: 2   Years of education: Not on file   Highest education level: Not on file  Occupational History   Occupation: unemployed  Tobacco Use   Smoking status: Former    Packs/day: 0.50    Years: 20.00    Total pack years: 10.00    Types: Cigarettes   Smokeless tobacco: Former    Types: Snuff    Quit date: 05/05/1986  Vaping Use   Vaping Use: Former   Quit date: 03/12/2018  Substance and Sexual Activity   Alcohol use: Not Currently    Comment: had a bout of heavy drinking 2015, drinks occasionally now    Drug use: Not Currently    Comment: past use of marijuana about every other day for two years, last used 25 years ago.    Sexual activity: Yes    Birth control/protection: None, Surgical    Comment: pt states she did not have a tubal  Other Topics Concern   Not on file  Social History Narrative   Not on file   Social Determinants of Health   Financial Resource Strain: Not on file  Food Insecurity: Not on file   Transportation Needs: Not on file  Physical Activity: Not on file  Stress: Not on file  Social Connections: Not on file  Intimate Partner Violence: Not on file     Review of Systems    General: No chills, fever, night sweats or weight changes.  Cardiovascular:  No chest pain, dyspnea on exertion, edema, orthopnea, palpitations, paroxysmal nocturnal dyspnea. Dermatological: No rash, lesions/masses Respiratory: No cough, dyspnea Urologic: No hematuria, dysuria Abdominal:   No nausea, vomiting, diarrhea, bright red blood per rectum, melena, or hematemesis Neurologic:  No visual changes, weakness, changes in mental status. All other systems reviewed and are otherwise negative except as noted above.  Physical Exam    VS:  BP 106/64 (BP Location: Left Arm, Patient Position: Sitting, Cuff Size: Large)   Ht '5\' 2"'$  (1.575 m)   Wt 209 lb 12.8 oz (95.2 kg)   LMP 09/07/2017   SpO2 96%   BMI 38.37 kg/m  , BMI Body mass index is 38.37 kg/m. GEN:  Well nourished, well developed, in no acute distress. HEENT: Normal. Neck: Supple, no JVD, carotid bruits, or masses. Cardiac: RRR, no murmurs, rubs, or gallops. No clubbing, cyanosis, edema.  Radials/DP/PT 2+ and equal bilaterally.  Respiratory:  Respirations regular and unlabored, decreased breath sounds bilaterally. GI: Soft, nontender, nondistended. MS: No deformity or atrophy. Skin: Warm and dry, no rash. Neuro: Strength and sensation are intact. Psych: Normal affect.  Accessory Clinical Findings    Recent Labs: No results found for requested labs within last 365 days.   Recent Lipid Panel    Component Value Date/Time   CHOL 225 (H) 05/19/2022 1000   TRIG 322 (H) 05/19/2022 1000   HDL 19 (L) 05/19/2022 1000   CHOLHDL 11.8 (H) 05/19/2022 1000   CHOLHDL 11 04/25/2014 0847   VLDL 44.0 (H) 04/25/2014 0847   LDLCALC 146 (H) 05/19/2022 1000   LDLDIRECT 184.2 04/25/2014 0847     ECG personally reviewed by me today: NSR, rate 76  bpm.  No  significant changes from 12/03/2021.    Assessment & Plan   CAD.  S/p PCI with DES x 3 mid to distal RCA performed at [facility] March 2023.  Patient with one episode of pain on 09/26/2022 for which she was seen at [facility] and received a GI cocktail. She states she was discharged home with pain which did not resolve until the next day. Denies any other episodes of pain since. She is active with her grandbaby and has been moving furniture in her home without chest pain or DOE. Continue aspirin, Brilinta, Vascepa, tricor, Repatha, Lipitor, isosorbide, metoprolol, and PRN SL NTG.  Palpitations.  14-day ZIO December 2022 did not show significant arrhythmias.  Patient denies palpitations. Continue Metoprolol.  PAD.  ABI April 2023 showed moderate left lower extremity arterial disease.  Followed by vascular surgery. Hypertension.  BP today 106/64. Patient denies headache or dizziness.  Continue Metoprolol.  Hyperlipidemia.  LDL September 2023 146, not at goal. She started Repatha 3 months ago but missed a dose. She is now on track. She will have repeat lipid panel and LFTs in 1 month. Continue Lipitor, Vascepa, Tricor, and Repatha.  Tobacco abuse. Continues to smoke 1/2 pack a day. She has a quit date of April 2024. She is on Chantix. She is encouraged to slowly cut back from now until quit date in April.    Disposition: Return in 6 months or sooner as needed.    Justice Britain. Elmore Hyslop, DNP, NP-C     10/01/2022, 10:15 AM [address] Office [phone] Fax [phone]

## 2022-10-01 ENCOUNTER — Ambulatory Visit: Payer: Medicare HMO | Attending: Cardiology | Admitting: Student

## 2022-10-01 ENCOUNTER — Encounter: Payer: Self-pay | Admitting: Adult Health

## 2022-10-01 VITALS — BP 106/64 | Ht 62.0 in | Wt 209.8 lb

## 2022-10-01 DIAGNOSIS — R002 Palpitations: Secondary | ICD-10-CM | POA: Diagnosis not present

## 2022-10-01 DIAGNOSIS — I739 Peripheral vascular disease, unspecified: Secondary | ICD-10-CM | POA: Diagnosis not present

## 2022-10-01 DIAGNOSIS — I2510 Atherosclerotic heart disease of native coronary artery without angina pectoris: Secondary | ICD-10-CM

## 2022-10-01 DIAGNOSIS — E785 Hyperlipidemia, unspecified: Secondary | ICD-10-CM

## 2022-10-01 DIAGNOSIS — F17200 Nicotine dependence, unspecified, uncomplicated: Secondary | ICD-10-CM

## 2022-10-01 DIAGNOSIS — I10 Essential (primary) hypertension: Secondary | ICD-10-CM

## 2022-10-01 NOTE — Patient Instructions (Signed)
Medication Instructions:  No Changes *If you need a refill on your cardiac medications before your next appointment, please call your pharmacy*   Lab Work: Lipid Panel, Hepatic panel If you have labs (blood work) drawn today and your tests are completely normal, you will receive your results only by: [place] (if you have MyChart) OR A paper copy in the mail If you have any lab test that is abnormal or we need to change your treatment, we will call you to review the results.   Testing/Procedures: No Testing   Follow-Up: At [facility], you and your health needs are our priority.  As part of our continuing mission to provide you with exceptional heart care, we have created designated Provider Care Teams.  These Care Teams include your primary Cardiologist (physician) and Advanced Practice Providers (APPs -  Physician Assistants and Nurse Practitioners) who all work together to provide you with the care you need, when you need it.  We recommend signing up for the patient portal called "MyChart".  Sign up information is provided on this After Visit Summary.  MyChart is used to connect with patients for Virtual Visits (Telemedicine).  Patients are able to view lab/test results, encounter notes, upcoming appointments, etc.  Non-urgent messages can be sent to your provider as well.   To learn more about what you can do with MyChart, go to [email or web address].    Your next appointment:   6 month(s)  Provider:   Donato Heinz, MD  or APP

## 2022-10-07 ENCOUNTER — Telehealth: Payer: Self-pay | Admitting: Cardiology

## 2022-10-07 NOTE — Telephone Encounter (Signed)
  Jamie with [facility] calling, she would like to ask Dr. Gardiner Rhyme if pt is cleared to do exercise without a cardiac monitor or if there's any restriction. She said, notes in epic will be great, they have access to epic.

## 2022-10-08 NOTE — Telephone Encounter (Signed)
Attempt to return call-office closed on Friday.

## 2022-10-08 NOTE — Telephone Encounter (Signed)
Yes she is cleared to exercise without monitor

## 2022-11-11 ENCOUNTER — Other Ambulatory Visit: Payer: Self-pay | Admitting: Cardiology

## 2022-11-12 LAB — HEPATIC FUNCTION PANEL
ALT: 16 IU/L (ref 0–32)
AST: 17 IU/L (ref 0–40)
Albumin: 4.4 g/dL (ref 3.9–4.9)
Alkaline Phosphatase: 141 IU/L — ABNORMAL HIGH (ref 44–121)
Bilirubin Total: 0.3 mg/dL (ref 0.0–1.2)
Bilirubin, Direct: <0.1 mg/dL (ref 0.00–0.40)
Total Protein: 6.9 g/dL (ref 6.0–8.5)

## 2022-11-12 LAB — LIPID PANEL
Chol/HDL Ratio: 5.9 ratio — ABNORMAL HIGH (ref 0.0–4.4)
Cholesterol, Total: 141 mg/dL (ref 100–199)
HDL: 24 mg/dL — ABNORMAL LOW
LDL Chol Calc (NIH): 74 mg/dL (ref 0–99)
Triglycerides: 264 mg/dL — ABNORMAL HIGH (ref 0–149)
VLDL Cholesterol Cal: 43 mg/dL — ABNORMAL HIGH (ref 5–40)

## 2022-11-17 ENCOUNTER — Other Ambulatory Visit: Payer: Self-pay | Admitting: Cardiology

## 2022-11-17 ENCOUNTER — Other Ambulatory Visit: Payer: Self-pay | Admitting: *Deleted

## 2022-11-17 DIAGNOSIS — I2510 Atherosclerotic heart disease of native coronary artery without angina pectoris: Secondary | ICD-10-CM

## 2022-11-17 DIAGNOSIS — E1169 Type 2 diabetes mellitus with other specified complication: Secondary | ICD-10-CM

## 2022-11-17 DIAGNOSIS — E785 Hyperlipidemia, unspecified: Secondary | ICD-10-CM

## 2022-11-17 MED ORDER — EZETIMIBE 10 MG PO TABS: 10.0000 mg | ORAL_TABLET | Freq: Every day | ORAL | 3 refills | Status: DC

## 2022-11-19 ENCOUNTER — Encounter: Payer: Self-pay | Admitting: Cardiology

## 2022-11-22 ENCOUNTER — Other Ambulatory Visit: Payer: Self-pay | Admitting: *Deleted

## 2022-11-22 DIAGNOSIS — I2510 Atherosclerotic heart disease of native coronary artery without angina pectoris: Secondary | ICD-10-CM

## 2022-11-22 DIAGNOSIS — E785 Hyperlipidemia, unspecified: Secondary | ICD-10-CM

## 2022-12-08 ENCOUNTER — Encounter: Payer: Self-pay | Admitting: Cardiology

## 2022-12-08 NOTE — Telephone Encounter (Signed)
Error

## 2022-12-14 ENCOUNTER — Other Ambulatory Visit: Payer: Self-pay | Admitting: *Deleted

## 2022-12-14 DIAGNOSIS — I739 Peripheral vascular disease, unspecified: Secondary | ICD-10-CM

## 2022-12-23 ENCOUNTER — Ambulatory Visit (INDEPENDENT_AMBULATORY_CARE_PROVIDER_SITE_OTHER): Payer: Medicare HMO | Admitting: Vascular Surgery

## 2022-12-23 ENCOUNTER — Encounter: Payer: Self-pay | Admitting: Vascular Surgery

## 2022-12-23 ENCOUNTER — Ambulatory Visit (HOSPITAL_COMMUNITY)
Admission: RE | Admit: 2022-12-23 | Discharge: 2022-12-23 | Disposition: A | Discharge status: Home / Self Care | Payer: Medicare HMO | Source: Ambulatory Visit | Attending: Vascular Surgery | Admitting: Vascular Surgery

## 2022-12-23 VITALS — BP 129/86 | HR 82 | Temp 98.1°F | Resp 20 | Ht 62.0 in | Wt 205.0 lb

## 2022-12-23 DIAGNOSIS — I70219 Atherosclerosis of native arteries of extremities with intermittent claudication, unspecified extremity: Secondary | ICD-10-CM | POA: Diagnosis not present

## 2022-12-23 DIAGNOSIS — I739 Peripheral vascular disease, unspecified: Secondary | ICD-10-CM | POA: Diagnosis present

## 2022-12-23 LAB — VAS US ABI WITH/WO TBI
Left ABI: 0.71
Right ABI: 1.04

## 2022-12-23 NOTE — Progress Notes (Signed)
REASON FOR VISIT:   Follow-up of peripheral arterial disease  MEDICAL ISSUES:   PERIPHERAL ARTERIAL DISEASE WITH CLAUDICATION: This patient has stable claudication of the left calf.  Her ABIs are stable.  She has no rest pain or nonhealing ulcers.  We have again discussed importance of tobacco cessation.  She is on aspirin and is on a statin.  I encouraged her to stay as active as possible.  We also discussed importance of nutrition.  She plans on having gastric bypass surgery in about 6 months.  I have ordered a follow-up ABI in 1 year.  I explained that I will be retiring so she will be seen on the PA schedule at that time.  She knows to call sooner if she has problems.  HPI:   Lisa Norton is a pleasant 52 y.o. female who I last saw on 12/17/2021.  I have been following her with peripheral arterial disease.  She has infrainguinal arterial occlusive disease bilaterally.  Her symptoms were worse on the left.  She had stable claudication.  We discussed importance of nutrition and exercise.  Of note she has a significant cardiac history.  She had a myocardial infarction in March 2023 and had 3 stents placed.  After that she quit smoking.  She comes in for a 1 year follow-up visit.  Since I saw her last, she does admit to left calf claudication.  She states she can get halfway through [facility] before experiencing symptoms.  She has no symptoms on the right leg.  She has no thigh or hip claudication.  She denies any history of rest pain or nonhealing ulcers.  She has a history of diabetes, hypertension, and hypercholesterolemia.  She had quit after her heart attack but has started smoking again.  She smoking about 4 cigarettes a day.  She is considering gastric bypass surgery and plans on quitting prior to this.  She hopes to stay off the cigarettes after that.  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   HYPERTENSION 12/12/2009   Myocardial infarction    TOBACCO ABUSE 12/12/2009    Family History  Problem Relation Age of Onset   Heart disease Mother    Narcolepsy Mother    Bipolar disorder Sister    Allergic rhinitis Neg Hx    Asthma Neg Hx    Eczema Neg Hx    Urticaria Neg Hx    Colon cancer Neg Hx    Esophageal cancer Neg Hx    Stomach cancer Neg Hx    Rectal cancer Neg Hx     SOCIAL HISTORY: Social History   Tobacco Use   Smoking status: Former    Packs/day: 0.50    Years: 20.00    Additional pack years: 0.00    Total pack years: 10.00    Types: Cigarettes   Smokeless tobacco: Former    Types: Snuff    Quit date: 05/05/1986  Substance Use Topics   Alcohol use: Not Currently    Comment: had a bout of heavy drinking 2015, drinks occasionally now     Allergies  Allergen Reactions   Metformin And Related Diarrhea    Current Outpatient Medications  Medication Sig Dispense Refill   Accu-Chek FastClix Lancets MISC 4 TIMES DAILY 102 each 5   aspirin EC 81 MG tablet  Take 1 tablet (81 mg total) by mouth daily. 90 tablet 3   BRILINTA 90 MG TABS tablet Take 90 mg by mouth 2 (two) times daily.     cetirizine (ZYRTEC) 10 MG tablet Take 1 tablet (10 mg total) by mouth daily. 30 tablet 11   doxycycline (VIBRAMYCIN) 100 MG capsule Take 100 mg by mouth 2 (two) times daily.     empagliflozin (JARDIANCE) 10 MG TABS tablet Take 10 mg by mouth daily.     EPINEPHrine 0.3 mg/0.3 mL IJ SOAJ injection Inject into the skin.     Evolocumab with Infusor (REPATHA PUSHTRONEX SYSTEM) 420 MG/3.5ML SOCT INJECT 3.5 MLS INTO THE SKIN EVERY 30(THIRTY) DAYS 10.8 mL 3   ezetimibe (ZETIA) 10 MG tablet Take 1 tablet (10 mg total) by mouth daily. 90 tablet 3   famotidine (PEPCID) 40 MG tablet Take 40 mg by mouth daily.     fenofibrate (TRICOR) 145 MG tablet Take 145 mg by mouth daily.     fluconazole (DIFLUCAN) 150 MG tablet Take  150 mg by mouth once.     fluticasone (FLONASE) 50 MCG/ACT nasal spray Place 2 sprays into both nostrils daily. 16 g 5   gabapentin (NEURONTIN) 400 MG capsule Take 400 mg by mouth daily.     glucose blood test strip Use to test BS QID and PRN dx.E11.65 100 each 12   icosapent Ethyl (VASCEPA) 1 g capsule Take 2 capsules (2 g total) by mouth 2 (two) times daily. 120 capsule 5   insulin degludec (TRESIBA) 200 UNIT/ML FlexTouch Pen Inject 50 Units into the skin in the morning.     isosorbide mononitrate (IMDUR) 30 MG 24 hr tablet Take 30 mg by mouth daily.     LANTUS SOLOSTAR 100 UNIT/ML Solostar Pen Inject into the skin.     metoprolol tartrate (LOPRESSOR) 25 MG tablet Take 25 mg by mouth 2 (two) times daily.     mupirocin ointment (BACTROBAN) 2 %      naloxone (NARCAN) nasal spray 4 mg/0.1 mL      neomycin-polymyxin b-dexamethasone (MAXITROL) 3.5-10000-0.1 SUSP      omeprazole (PRILOSEC) 40 MG capsule      ondansetron (ZOFRAN) 4 MG tablet Take 1 tablet (4 mg total) by mouth every 6 (six) hours as needed for nausea or vomiting. 50 tablet 1   oxyCODONE-acetaminophen (PERCOCET) 5-325 MG tablet Take 1 tablet by mouth every 6 (six) hours as needed for severe pain. 30 tablet 0   OZEMPIC, 1 MG/DOSE, 4 MG/3ML SOPN Inject 1 mg into the skin once a week.     topiramate (TOPAMAX) 25 MG tablet Take 25 mg by mouth 2 (two) times daily.     ULTICARE MICRO PEN NEEDLES 32G X 4 MM MISC 3 TIMES DAILY 100 each 11   valACYclovir (VALTREX) 1000 MG tablet Take 1 tablet (1,000 mg total) by mouth 2 (two) times daily. 20 tablet 6   varenicline (CHANTIX) 1 MG tablet Take 1 tablet (1 mg total) by mouth 2 (two) times daily. 60 tablet 5   Varenicline Tartrate, Starter, (CHANTIX STARTING MONTH PAK) 0.5 MG X 11 & 1 MG X 42 TBPK Take 0.5mg  once daily for 3 days, then take 0.5mg  twice a day for days 4 to 7, then take  twice a day 1 each 0   atorvastatin (LIPITOR) 80 MG tablet Take 1 tablet (80 mg total) by mouth daily. 90  tablet 3   nitroGLYCERIN (NITROSTAT) 0.4 MG SL tablet Place 1 tablet (0.4  mg total) under the tongue every 5 (five) minutes as needed. 25 tablet 3   No current facility-administered medications for this visit.    REVIEW OF SYSTEMS:  [X]  denotes positive finding, [ ]  denotes negative finding Cardiac  Comments:  Chest pain or chest pressure:    Shortness of breath upon exertion:    Short of breath when lying flat:    Irregular heart rhythm:        Vascular    Pain in calf, thigh, or hip brought on by ambulation: x   Pain in feet at night that wakes you up from your sleep:  x   Blood clot in your veins:    Leg swelling:         Pulmonary    Oxygen at home:    Productive cough:     Wheezing:         Neurologic    Sudden weakness in arms or legs:     Sudden numbness in arms or legs:     Sudden onset of difficulty speaking or slurred speech:    Temporary loss of vision in one eye:     Problems with dizziness:         Gastrointestinal    Blood in stool:     Vomited blood:         Genitourinary    Burning when urinating:     Blood in urine:        Psychiatric    Major depression:         Hematologic    Bleeding problems:    Problems with blood clotting too easily:        Skin    Rashes or ulcers:        Constitutional    Fever or chills:     PHYSICAL EXAM:   Vitals:   12/23/22 0947  BP: 129/86  Pulse: 82  Resp: 20  Temp: 98.1 F (36.7 C)  SpO2: 97%  Weight: 205 lb (93 kg)  Height: 5\' 2"  (1.575 m)   Body mass index is 37.49 kg/m.  GENERAL: The patient is a well-nourished female, in no acute distress. The vital signs are documented above. CARDIAC: There is a regular rate and rhythm.  VASCULAR: I do not detect carotid bruits. On the right side she has a palpable femoral, popliteal, and dorsalis pedis pulse. On the left side she has a slightly diminished femoral pulse.  I cannot palpate pedal pulses. She has no significant lower extremity  swelling. PULMONARY: There is good air exchange bilaterally without wheezing or rales. ABDOMEN: Soft and non-tender with normal pitched bowel sounds.  MUSCULOSKELETAL: There are no major deformities or cyanosis. NEUROLOGIC: No focal weakness or paresthesias are detected. SKIN: There are no ulcers or rashes noted. PSYCHIATRIC: The patient has a normal affect.  DATA:    ARTERIAL DOPPLER STUDY: I have independently interpreted her arterial Doppler study today.  On the right side there is a biphasic posterior tibial and dorsalis pedis signal.  ABI is 100%.  Toe pressures 87 mmHg.  On the left side, there is a monophasic posterior tibial and dorsalis pedis signal.  ABI is 71%.  This is stable compared to 74% a year ago.  Toe pressure on the left is 87 mmHg.  Waverly Ferrari [facility] [phone]

## 2022-12-31 ENCOUNTER — Telehealth: Payer: Self-pay | Admitting: Cardiology

## 2022-12-31 NOTE — Telephone Encounter (Signed)
Paper Work Dropped Off: pre-op paperwork  Date: 05.03.24 @12 .10 pm  Location of paper: Placed in Dr [employer]

## 2023-01-03 ENCOUNTER — Other Ambulatory Visit: Payer: Self-pay

## 2023-01-03 DIAGNOSIS — I739 Peripheral vascular disease, unspecified: Secondary | ICD-10-CM

## 2023-01-04 ENCOUNTER — Telehealth: Payer: Self-pay | Admitting: *Deleted

## 2023-01-04 NOTE — Telephone Encounter (Signed)
   Pre-operative Risk Assessment    Patient Name: Lisa Norton  DOB: Apr 05, 1971 MRN: [id number]      Request for Surgical Clearance    Procedure:   laparoscopic bariatric surgery  Date of Surgery:  Clearance TBD                                 Surgeon:   Surgeon's Group or Practice Name:  [facility] Phone number:  [phone] Fax number:  [phone]   Type of Clearance Requested:   - Medical  - Pharmacy:  Hold Ticagrelor (Brilinta)     Type of Anesthesia:       Additional requests/questions:  Please advise surgeon/provider what medications should be held.  Signed, Harvel Ricks   01/04/2023, 2:30 PM

## 2023-01-04 NOTE — Telephone Encounter (Signed)
   Name: Lisa Norton  DOB: 02-Jan-1971  MRN: [id number]  Primary Cardiologist: Little Ishikawa, MD   Preoperative team, please contact this patient and set up a phone call appointment for further preoperative risk assessment. Please obtain consent and complete medication review. Thank you for your help.  I confirm that guidance regarding antiplatelet and oral anticoagulation therapy has been completed and, if necessary, noted below.  Per office protocol, he may hold Brilinta for 5-7 days prior to procedure and should resume as soon as hemodynamically stable postoperatively.    Carlos Levering, NP 01/04/2023, 5:11 PM [facility]

## 2023-01-05 ENCOUNTER — Telehealth: Payer: Self-pay | Admitting: *Deleted

## 2023-01-05 NOTE — Telephone Encounter (Signed)
S/w pt, set up telephone clearance.    Patient Consent for Virtual Visit         Lisa Norton has provided verbal consent on 01/05/2023 for a virtual visit (video or telephone).   CONSENT FOR VIRTUAL VISIT FOR:  Lisa Norton  By participating in this virtual visit I agree to the following:  I hereby voluntarily request, consent and authorize [facility] and its employed or contracted physicians, physician assistants, nurse practitioners or other licensed health care professionals (the Practitioner), to provide me with telemedicine health care services (the "Services") as deemed necessary by the treating Practitioner. I acknowledge and consent to receive the Services by the Practitioner via telemedicine. I understand that the telemedicine visit will involve communicating with the Practitioner through live audiovisual communication technology and the disclosure of certain medical information by electronic transmission. I acknowledge that I have been given the opportunity to request an in-person assessment or other available alternative prior to the telemedicine visit and am voluntarily participating in the telemedicine visit.  I understand that I have the right to withhold or withdraw my consent to the use of telemedicine in the course of my care at any time, without affecting my right to future care or treatment, and that the Practitioner or I may terminate the telemedicine visit at any time. I understand that I have the right to inspect all information obtained and/or recorded in the course of the telemedicine visit and may receive copies of available information for a reasonable fee.  I understand that some of the potential risks of receiving the Services via telemedicine include:  Delay or interruption in medical evaluation due to technological equipment failure or disruption; Information transmitted may not be sufficient (e.g. poor resolution of images) to allow for appropriate  medical decision making by the Practitioner; and/or  In rare instances, security protocols could fail, causing a breach of personal health information.  Furthermore, I acknowledge that it is my responsibility to provide information about my medical history, conditions and care that is complete and accurate to the best of my ability. I acknowledge that Practitioner's advice, recommendations, and/or decision may be based on factors not within their control, such as incomplete or inaccurate data provided by me or distortions of diagnostic images or specimens that may result from electronic transmissions. I understand that the practice of medicine is not an exact science and that Practitioner makes no warranties or guarantees regarding treatment outcomes. I acknowledge that a copy of this consent can be made available to me via my patient portal [facility] MyChart), or I can request a printed copy by calling the office of [facility].    I understand that my insurance will be billed for this visit.   I have read or had this consent read to me. I understand the contents of this consent, which adequately explains the benefits and risks of the Services being provided via telemedicine.  I have been provided ample opportunity to ask questions regarding this consent and the Services and have had my questions answered to my satisfaction. I give my informed consent for the services to be provided through the use of telemedicine in my medical care

## 2023-01-12 ENCOUNTER — Encounter: Payer: Self-pay | Admitting: Nurse Practitioner

## 2023-01-12 ENCOUNTER — Ambulatory Visit: Payer: Medicare HMO | Attending: Cardiovascular Disease | Admitting: Nurse Practitioner

## 2023-01-12 DIAGNOSIS — Z01810 Encounter for preprocedural cardiovascular examination: Secondary | ICD-10-CM | POA: Diagnosis not present

## 2023-01-12 NOTE — Progress Notes (Signed)
Virtual Visit via Telephone Note   Because of Lisa Norton's co-morbid illnesses, she is at least at moderate risk for complications without adequate follow up.  This format is felt to be most appropriate for this patient at this time.  The patient did not have access to video technology/had technical difficulties with video requiring transitioning to audio format only (telephone).  All issues noted in this document were discussed and addressed.  No physical exam could be performed with this format.  Please refer to the patient's chart for her consent to telehealth for [facility].  Evaluation Performed:  Preoperative cardiovascular risk assessment _____________   Date:  01/12/2023   Patient ID:  Lisa Norton, DOB [birth year], MRN [id number] Patient Location:  Home Provider location:   Office  Primary Care Provider:  Kirt Boys, PA-C Primary Cardiologist:  Little Ishikawa, MD  Chief Complaint / Patient Profile   52 y.o. y/o female with a h/o CAD s/p PCI with DES x 3 to RCA, PAD, OSA on CPAP, palpitations, HTN, HLD, type 2 DM, tobacco abuse, DVT left lower extremity 2021 now resolved who is pending laparoscopic bariatric surgery and presents today for telephonic preoperative cardiovascular risk assessment.  History of Present Illness    Lisa Norton is a 52 y.o. female who presents via audio/video conferencing for a telehealth visit today.  Pt was last seen in cardiology clinic on 10/01/22 by Carlos Levering, NP.  At that time EARTHA SUMMERHILL was doing well.  The patient is now pending procedure as outlined above. Since her last visit, she denies chest pain, shortness of breath, lower extremity edema, fatigue, palpitations, melena, hematuria, hemoptysis, diaphoresis, weakness, presyncope, syncope, orthopnea, and PND. She is somewhat limited by pain in her left leg but is able to achieve > 4 METS activity without concerning cardiac symptoms.    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   HYPERTENSION 12/12/2009   Myocardial infarction [facility])    TOBACCO ABUSE 12/12/2009   Past Surgical History:  Procedure Laterality Date   BLADDER SUSPENSION     CERVICAL FUSION     CESAREAN SECTION WITH BILATERAL TUBAL LIGATION Bilateral 02/12/2015   Procedure: CESAREAN SECTION;  Surgeon: Lazaro Arms, MD;  Location: [facility];  Service: Obstetrics;  Laterality: Bilateral;  Fetal Demise   CHOLECYSTECTOMY  2006   CORONARY ANGIOPLASTY WITH STENT PLACEMENT     EVALUATION UNDER ANESTHESIA WITH HEMORRHOIDECTOMY N/A 10/12/2018   Procedure: LATERAL INTERNAL HEMORRHOID LIGATION/PEXY ANORECTAL EXAM UNDER ANESTHESIA WITH HEMORRHOIDECTOMY X2;  Surgeon: Karie Soda, MD;  Location: [facility];  Service: General;  Laterality: N/A;   FOOT SURGERY Right    Plantar, bone spurs   FOOT SURGERY Bilateral 2019 and 2020   11/2018, 05/31/2019   LAPAROSCOPIC VAGINAL HYSTERECTOMY WITH SALPINGECTOMY  02/16/2018   [facility]   TONSILLECTOMY  1988    Allergies  Allergies  Allergen Reactions   Metformin And Related Diarrhea    Home Medications    Prior to Admission medications   Medication Sig Start Date End Date Taking? Authorizing Provider  Accu-Chek FastClix Lancets MISC 4 TIMES DAILY 01/02/19   Sonny Masters, FNP  aspirin EC 81 MG tablet Take 1 tablet (81 mg total) by mouth daily. 12/12/19  Little Ishikawa, MD  atorvastatin (LIPITOR) 80 MG tablet Take 1 tablet (80 mg total) by mouth daily. 07/23/20 01/05/23  Little Ishikawa, MD  BRILINTA 90 MG TABS tablet Take 90 mg by mouth 2 (two) times daily. 01/15/22   [provider]  empagliflozin (JARDIANCE) 10 MG TABS tablet Take 10 mg by mouth daily. 12/21/22   [provider]  EPINEPHrine 0.3  mg/0.3 mL IJ SOAJ injection Inject into the skin. 09/20/21   [provider]  Evolocumab with Infusor (REPATHA PUSHTRONEX SYSTEM) 420 MG/3.5ML SOCT INJECT 3.5 MLS INTO THE SKIN EVERY 30(THIRTY) DAYS 11/17/22   Alvstad, Belenda Cruise L, RPH-CPP  ezetimibe (ZETIA) 10 MG tablet Take 1 tablet (10 mg total) by mouth daily. 11/17/22 11/12/23  Little Ishikawa, MD  famotidine (PEPCID) 40 MG tablet Take 40 mg by mouth daily. 07/12/22   [provider]  fenofibrate (TRICOR) 145 MG tablet Take 145 mg by mouth daily. 12/11/21   [provider]  gabapentin (NEURONTIN) 400 MG capsule Take 400 mg by mouth daily. 09/01/21   [provider]  glucose blood test strip Use to test BS QID and PRN dx.E11.65 11/20/19   Rakes, Doralee Albino, FNP  icosapent Ethyl (VASCEPA) 1 g capsule Take 2 capsules (2 g total) by mouth 2 (two) times daily. 02/16/22   Little Ishikawa, MD  insulin degludec (TRESIBA) 200 UNIT/ML FlexTouch Pen Inject 50 Units into the skin in the morning. 09/17/22   [provider]  isosorbide mononitrate (IMDUR) 30 MG 24 hr tablet Take 30 mg by mouth daily. 01/19/22   [provider]  metoprolol tartrate (LOPRESSOR) 25 MG tablet Take 25 mg by mouth 2 (two) times daily.    [provider]  naloxone [facility]) nasal spray 4 mg/0.1 mL  08/29/21   [provider]  nitroGLYCERIN (NITROSTAT) 0.4 MG SL tablet Place 1 tablet (0.4 mg total) under the tongue every 5 (five) minutes as needed. 12/03/21 01/05/23  Little Ishikawa, MD  oxyCODONE-acetaminophen (PERCOCET) 5-325 MG tablet Take 1 tablet by mouth every 6 (six) hours as needed for severe pain. 09/12/19   Felecia Shelling, DPM  OZEMPIC, 1 MG/DOSE, 4 MG/3ML SOPN Inject 2 mg into the skin once a week. 09/17/22   [provider]  polyethylene glycol powder (GLYCOLAX/MIRALAX) 17 GM/SCOOP powder Take 0.5 Containers by mouth as needed for moderate constipation. 02/20/18   [provider]   topiramate (TOPAMAX) 25 MG tablet Take 25 mg by mouth 2 (two) times daily. 09/21/21   [provider]  Stann Ore MICRO PEN NEEDLES 32G X 4 MM MISC 3 TIMES DAILY 02/12/19   Sonny Masters, FNP  valACYclovir (VALTREX) 1000 MG tablet Take 1 tablet (1,000 mg total) by mouth 2 (two) times daily. 10/15/19   Sonny Masters, FNP  varenicline (CHANTIX) 1 MG tablet Take 1 tablet (1 mg total) by mouth 2 (two) times daily. 02/16/22   Little Ishikawa, MD    Physical Exam    Vital Signs:  LARAVEN [place] does not have vital signs available for review today.  Given telephonic nature of communication, physical exam is limited. AAOx3. NAD. Normal affect.  Speech and respirations are unlabored.  Accessory Clinical Findings    None  Assessment & Plan    1.  Preoperative Cardiovascular Risk Assessment: According to the Revised Cardiac Risk Index (RCRI), her Perioperative Risk of Major Cardiac Event is (%): 11. Her Functional Capacity in METs is: 7.25 according to the  [facility] Activity Status Index (DASI).  The patient was advised that if she develops new symptoms prior to surgery to contact our office to arrange for a follow-up visit, and she verbalized understanding.  Per office protocol, he may hold Brilinta for 5 days prior to procedure and should resume as soon as hemodynamically stable postoperatively. Ideally aspirin should be continued without interruption, however if the bleeding risk is too great, aspirin may be held for 5-7 days prior to surgery. Please resume aspirin post operatively when it is felt to be safe from a bleeding standpoint.   A copy of this note will be routed to requesting surgeon.  Time:   Today, I have spent 7 minutes with the patient with telehealth technology discussing medical history, symptoms, and management plan.    Levi Aland, NP-C  01/12/2023, 10:00 AM [address], [address] Office [phone] Fax [phone]

## 2023-04-15 NOTE — Progress Notes (Signed)
treated
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
## 2023-04-18 ENCOUNTER — Encounter: Payer: Self-pay | Admitting: Cardiology

## 2023-04-18 ENCOUNTER — Ambulatory Visit: Payer: Medicare HMO | Admitting: Cardiology

## 2023-04-18 VITALS — BP 118/72 | HR 83 | Ht 62.0 in | Wt 198.2 lb

## 2023-04-18 DIAGNOSIS — F17200 Nicotine dependence, unspecified, uncomplicated: Secondary | ICD-10-CM

## 2023-04-18 DIAGNOSIS — I25118 Atherosclerotic heart disease of native coronary artery with other forms of angina pectoris: Secondary | ICD-10-CM

## 2023-04-18 DIAGNOSIS — G4733 Obstructive sleep apnea (adult) (pediatric): Secondary | ICD-10-CM

## 2023-04-18 DIAGNOSIS — E785 Hyperlipidemia, unspecified: Secondary | ICD-10-CM | POA: Diagnosis not present

## 2023-04-18 DIAGNOSIS — R002 Palpitations: Secondary | ICD-10-CM

## 2023-04-18 DIAGNOSIS — I739 Peripheral vascular disease, unspecified: Secondary | ICD-10-CM | POA: Diagnosis not present

## 2023-04-18 NOTE — Patient Instructions (Signed)
Medication Instructions:  Stop Brilinta *If you need a refill on your cardiac medications before your next appointment, please call your pharmacy*  Follow-Up: At [facility], you and your health needs are our priority.  As part of our continuing mission to provide you with exceptional heart care, we have created designated Provider Care Teams.  These Care Teams include your primary Cardiologist (physician) and Advanced Practice Providers (APPs -  Physician Assistants and Nurse Practitioners) who all work together to provide you with the care you need, when you need it.  We recommend signing up for the patient portal called "MyChart".  Sign up information is provided on this After Visit Summary.  MyChart is used to connect with patients for Virtual Visits (Telemedicine).  Patients are able to view lab/test results, encounter notes, upcoming appointments, etc.  Non-urgent messages can be sent to your provider as well.   To learn more about what you can do with MyChart, go to [email or web address].    Your next appointment:   6 month(s)  Provider:   Little Ishikawa, MD     Other Instructions Referred to Pulmonology for Obstructive Sleep apnea

## 2023-04-29 ENCOUNTER — Encounter: Payer: Self-pay | Admitting: Cardiology

## 2023-09-23 ENCOUNTER — Other Ambulatory Visit: Payer: Self-pay | Admitting: Cardiology

## 2023-10-19 ENCOUNTER — Telehealth: Payer: Self-pay | Admitting: Cardiology

## 2023-10-19 NOTE — Telephone Encounter (Signed)
Patient identification verified by 2 forms. Marilynn Rail, RN    Called and spoke to patient  Patient states:   -developed chest discomfort a few days ago   -discomfort comes and goes   -discomfort can last a few minutes   -feels like a sharp pain   -a few times discomfort has felt like pressure   -took NTG once and it alleviated the pain   -last episode of chest discomfort was last night  Patient denies:   -SOB/difficulty breathing   -sweating   -radiating down arm or to jaw  Patient scheduled for OV 2/20 at 10:05am  Reviewed ED warning signs/precautions  Patient verbalized understanding, no questions at this time

## 2023-10-19 NOTE — Telephone Encounter (Signed)
Pt c/o of Chest Pain: STAT if CP now or developed within 24 hours  1. Are you having CP right now?  No   2. Are you experiencing any other symptoms (ex. SOB, nausea, vomiting, sweating)?  Feels like something is going in her throat   3. How long have you been experiencing CP?  Past few days   4. Is your CP continuous or coming and going?  Coming an going   5. Have you taken Nitroglycerin?  Yes and it helped  ?

## 2023-10-20 ENCOUNTER — Ambulatory Visit: Payer: Medicare HMO | Attending: General Practice | Admitting: Emergency Medicine

## 2023-10-20 ENCOUNTER — Encounter: Payer: Self-pay | Admitting: Emergency Medicine

## 2023-10-20 VITALS — BP 132/74 | Ht 61.0 in | Wt 202.0 lb

## 2023-10-20 DIAGNOSIS — G4733 Obstructive sleep apnea (adult) (pediatric): Secondary | ICD-10-CM

## 2023-10-20 DIAGNOSIS — E785 Hyperlipidemia, unspecified: Secondary | ICD-10-CM | POA: Diagnosis not present

## 2023-10-20 DIAGNOSIS — R079 Chest pain, unspecified: Secondary | ICD-10-CM

## 2023-10-20 DIAGNOSIS — I2510 Atherosclerotic heart disease of native coronary artery without angina pectoris: Secondary | ICD-10-CM

## 2023-10-20 DIAGNOSIS — F17200 Nicotine dependence, unspecified, uncomplicated: Secondary | ICD-10-CM

## 2023-10-20 DIAGNOSIS — R002 Palpitations: Secondary | ICD-10-CM | POA: Diagnosis not present

## 2023-10-20 DIAGNOSIS — I739 Peripheral vascular disease, unspecified: Secondary | ICD-10-CM

## 2023-10-20 NOTE — Progress Notes (Signed)
Cardiology Office Note:    Date:  10/20/2023  ID:  [name], DOB March 07, 1971, MRN [id number] PCP: Kirt Boys, PA-C  [facility] Providers Cardiologist:  Little Ishikawa, MD       Patient Profile:      Lisa Norton is a 53 y.o. female with visit-pertinent history of coronary artery disease, type 2 diabetes, hyperlipidemia, hypertension, tobacco use.  She was initially seen for acute visit after found to have DVT on imaging 09/12/2019.  She was found to have acute occlusive thrombus in one of the left peroneal veins.  On 09/12/2019 she was started on Eliquis to complete a 25-month course for distal DVT.  Repeat duplex on 12/18/2019 showed resolution of DVT.  Arterial duplex was also ordered and showed right SFA 50 to 74% stenosis and left SFA total occlusion.  She was seen by Dr. Durwin Nora and vascular surgery who recommended conservative management.  She reported shortness of breath and TEE was done on 12/25/2019 that showed normal biventricular function, grade 1 DD, no significant valvular disease.  She had noted palpitations and Zio patch x 14 days was completed on 08/13/2021 showing no significant arrhythmias.  She was admitted at [facility] in March 2023 with NSTEMI.  She underwent PCI to mid-distal RCA with DES x 3.  Echo 11/05/2021 showed EF 65%, no significant valvular disease.  Last seen in clinic on 04/18/2023.  Brilinta was discontinued since it has been over 1 year since PCI.  She was referred to sleep medicine that she was not able to tolerate her CPAP.  It was noted she began smoking cigarettes again.  Medication regimen was continued and she was to follow-up in 6 months.      History of Present Illness:  Discussed the use of AI scribe software for clinical note transcription with the patient, who gave verbal consent to proceed.  Lisa Norton is a 53 y.o. female who returns for acute visit for chest pain.   Patient arrives today with complaints of recent onset  of chest pain. The pain is located to the left side of her chest, just over her left breast and associated with a sensation in the lower part of the throat. The pain started about six days ago and is not constant, but comes and goes, lasting from a few seconds to a few minutes. The episodes occur both at rest and on exertion, with no particular triggers identified.  She is able to walk up stairs, walk, take trash out without chest pain.  There is no radiation of her pain. The patient estimates the episodes occur two to three times a day. Nitroglycerin was taken twice and helped alleviate the pain.  She has been cutting her metoprolol tartrate dose in half due to perceived shortness of breath in the past. She has a history of smoking and is currently on Chantix, reducing her cigarette consumption to about ten per day. She also has sleep apnea but does not use a mask due to nasal congestion, for which she is seeing an ENT specialist.    Review of Systems  Constitutional: Negative for weight gain and weight loss.  Cardiovascular:  Positive for chest pain. Negative for claudication, dyspnea on exertion, irregular heartbeat, leg swelling, near-syncope, orthopnea, palpitations, paroxysmal nocturnal dyspnea and syncope.  Respiratory:  Negative for cough, hemoptysis and shortness of breath.   Gastrointestinal:  Negative for abdominal pain, hematochezia and melena.  Genitourinary:  Negative for hematuria.  Neurological:  Negative for  dizziness and light-headedness.     See HPI     Home Medications:    Prior to Admission medications   Medication Sig Start Date End Date Taking? Authorizing Provider  Accu-Chek FastClix Lancets MISC 4 TIMES DAILY 01/02/19   Sonny Masters, FNP  aspirin EC 81 MG tablet Take 1 tablet (81 mg total) by mouth daily. 12/12/19   Little Ishikawa, MD  atorvastatin (LIPITOR) 80 MG tablet Take 1 tablet (80 mg total) by mouth daily. 07/23/20 04/18/23  Little Ishikawa, MD   clobetasol ointment (TEMOVATE) 0.05 % Apply 1 Application topically 2 (two) times daily. 04/06/23   [provider]  empagliflozin (JARDIANCE) 10 MG TABS tablet Take 10 mg by mouth daily. 12/21/22   [provider]  EPINEPHrine 0.3 mg/0.3 mL IJ SOAJ injection Inject into the skin. 09/20/21   [provider]  Evolocumab with Infusor (REPATHA PUSHTRONEX SYSTEM) 420 MG/3.5ML SOCT INJECT 3.5 MLS INTO THE SKIN EVERY 30(THIRTY) DAYS 11/17/22   Alvstad, Belenda Cruise L, RPH-CPP  ezetimibe (ZETIA) 10 MG tablet TAKE ONE TABLET BY MOUTH ONCE DAILY 09/23/23   Little Ishikawa, MD  famotidine (PEPCID) 40 MG tablet Take 40 mg by mouth daily. 07/12/22   [provider]  fenofibrate (TRICOR) 145 MG tablet Take 145 mg by mouth daily. 12/11/21   [provider]  fluconazole (DIFLUCAN) 100 MG tablet Take 100 mg by mouth daily. 04/04/23   [provider]  gabapentin (NEURONTIN) 400 MG capsule Take 400 mg by mouth daily. 09/01/21   [provider]  glucose blood test strip Use to test BS QID and PRN dx.E11.65 11/20/19   Rakes, Doralee Albino, FNP  icosapent Ethyl (VASCEPA) 1 g capsule Take 2 capsules (2 g total) by mouth 2 (two) times daily. 02/16/22   Little Ishikawa, MD  insulin degludec (TRESIBA) 200 UNIT/ML FlexTouch Pen Inject 50 Units into the skin in the morning. 09/17/22   [provider]  isosorbide mononitrate (IMDUR) 30 MG 24 hr tablet Take 30 mg by mouth daily. 01/19/22   [provider]  ketoconazole (NIZORAL) 2 % shampoo Apply 1 Application topically 2 (two) times a week. 04/06/23   [provider]  metoprolol tartrate (LOPRESSOR) 25 MG tablet Take 25 mg by mouth 2 (two) times daily.    [provider]  naloxone [facility]) nasal spray 4 mg/0.1 mL  08/29/21   [provider]  nitroGLYCERIN (NITROSTAT) 0.4 MG SL tablet Place 1 tablet (0.4 mg total) under the tongue every 5 (five) minutes as needed. 12/03/21 01/05/23   Little Ishikawa, MD  oxyCODONE-acetaminophen (PERCOCET) 5-325 MG tablet Take 1 tablet by mouth every 6 (six) hours as needed for severe pain. 09/12/19   Felecia Shelling, DPM  OZEMPIC, 2 MG/DOSE, 8 MG/3ML SOPN Inject into the skin.    [provider]  polyethylene glycol powder (GLYCOLAX/MIRALAX) 17 GM/SCOOP powder Take 0.5 Containers by mouth as needed for moderate constipation. 02/20/18   [provider]  topiramate (TOPAMAX) 25 MG tablet Take 25 mg by mouth 2 (two) times daily. Patient not taking: Reported on 04/18/2023 09/21/21   [provider]  Stann Ore MICRO PEN NEEDLES 32G X 4 MM MISC 3 TIMES DAILY 02/12/19   Sonny Masters, FNP  valACYclovir (VALTREX) 1000 MG tablet Take 1 tablet (1,000 mg total) by mouth 2 (two) times daily. 10/15/19   Sonny Masters, FNP  varenicline (CHANTIX) 1 MG tablet Take 1 tablet (1 mg total) by mouth  2 (two) times daily. 02/16/22   Little Ishikawa, MD   Studies Reviewed:   EKG Interpretation Date/Time:  Thursday October 20 2023 10:44:17 EST Ventricular Rate:  71 PR Interval:  182 QRS Duration:  84 QT Interval:  404 QTC Calculation: 439 R Axis:   -20  Text Interpretation: Normal sinus rhythm Normal ECG Confirmed by Rise Paganini [phone]) on 10/20/2023 11:19:29 AM    VAS [place] ABI 12/23/2022 Right: Resting right ankle-brachial index is within normal range. The  right toe-brachial index is abnormal.   Left: Resting left ankle-brachial index indicates moderate left lower  extremity arterial disease. The left toe-brachial index is abnormal.   ZIO 08/13/2021 Patch Wear Time:  14 days and 0 hours (2022-11-26T13:41:21-0500 to 2022-12-10T13:41:25-0500)   Patient had a min HR of 60 bpm, max HR of 153 bpm, and avg HR of 88 bpm. Predominant underlying rhythm was Sinus Rhythm. Isolated SVEs were rare (<1.0%), SVE Couplets were rare (<1.0%), and no SVE Triplets were present. Isolated VEs were rare (<1.0%),  and no VE Couplets or  VE Triplets were present.  15 patient triggered events, corresponding to sinus rhythm  PACs Risk Assessment/Calculations:             Physical Exam:   VS:  BP 132/74 (BP Location: Left Arm, Patient Position: Sitting, Cuff Size: Large)   Ht 5\' 1"  (1.549 m)   Wt 202 lb (91.6 kg)   LMP 09/07/2017   BMI 38.17 kg/m    Wt Readings from Last 3 Encounters:  10/20/23 202 lb (91.6 kg)  04/18/23 198 lb 3.2 oz (89.9 kg)  12/23/22 205 lb (93 kg)    Constitutional:      Appearance: Normal and healthy appearance. Not in distress.  HENT:     Head: Normocephalic.  Neck:     Vascular: JVD normal.  Pulmonary:     Effort: Pulmonary effort is normal.     Breath sounds: Normal breath sounds.  Chest:     Chest wall: Not tender to palpatation.  Cardiovascular:     PMI at left midclavicular line. Normal rate. Regular rhythm. Normal S1. Normal S2.      Murmurs: There is no murmur.     No gallop.  No click. No rub.  Pulses:    Intact distal pulses.  Edema:    Peripheral edema absent.  Musculoskeletal: Normal range of motion.     Cervical back: Normal range of motion and neck supple. Skin:    General: Skin is warm and dry.  Neurological:     General: No focal deficit present.     Mental Status: Alert, oriented to person, place, and time and oriented to person, place and time.  Psychiatric:        Mood and Affect: Mood and affect normal.        Behavior: Behavior is cooperative.        Thought Content: Thought content normal.        Assessment and Plan:  Coronary artery disease / Chest pain She was admitted at [facility] in March 2023 with NSTEMI.  Underwent PCI to mid - distal RCA with DES x3.  Cath also showed 60% mid LAD stenosis and 60% diagonal stenosis, both negative for ischemia by IFR.   Echo 11/05/2021 showed EF 65%, no significant valvular disease -New onset of left-sided chest pain, intermittent in nature, lasting seconds to minutes, occurring at rest and on exertion, occurring 2-3  times a day, no radiation and no aggravating  factors, with some relief from nitroglycerin -EKG today shows normal sinus rhythm HR 71 bpm but no ST-T wave changes -Order Cardiac PET stress for further ischemic evaluation -BMET and CBC today -Will have her increase dosage of metoprolol tartrate from 12.5 mg BID to 25 mg BID for antianginal benefit -Continue aspirin 81 mg daily, atorvastatin 80 mg daily, Zetia 10 mg daily, isosorbide 30 mg daily, nitroglycerin -ED precautions given  Palpitations Zio patch x 14 days 07/2021 showed no significant arrhythmia. She denies any recent palpitations -Continue metoprolol tartrate  DVT Venous duplex 08/2019 shows acute deep vein thrombosis involving the left peroneal veins.  She completed 31-month course of Eliquis -Today she is without any leg pain or swelling  Peripheral arterial disease Arterial duplex shows right SFA 50 to 75% stenosis and left SFA total occlusion.   ABI 11/2022 1.04 on right and 0.71 on left -She denies any claudication symptoms at this time -Managed by vascular surgery -Encouraged complete smoking cessation  Hyperlipidemia LDL 74, HDL 24, TG 264, TC 141 on 08/6107 LDL cholesterol is currently uncontrolled and above goal of less than 70 -She will need repeat lipid panel and follow-up visit -Continue atorvastatin 80 mg daily, Zetia 10 mg daily, Vascepa 2G twice daily  Tobacco use Currently smoking 10 cigarettes/day, only changes her smoking cessation -Complete tobacco cessation encouraged  OSA Nonadherent to CPAP due to nasal congestion -Currently working with ENT          Informed Consent   Shared Decision Making/Informed Consent The risks [chest pain, shortness of breath, cardiac arrhythmias, dizziness, blood pressure fluctuations, myocardial infarction, stroke/transient ischemic attack, nausea, vomiting, allergic reaction, radiation exposure, metallic taste sensation and life-threatening complications (estimated to be  1 in 10,000)], benefits (risk stratification, diagnosing coronary artery disease, treatment guidance) and alternatives of a cardiac PET stress test were discussed in detail with Ms. Lupu and she agrees to proceed.     Dispo:  Return in about 6 weeks (around 12/01/2023).  Signed, Denyce Robert, NP

## 2023-10-20 NOTE — Patient Instructions (Addendum)
Medication Instructions:  The current medical regimen is effective;  continue present plan and medications as directed. Please refer to the Current Medication list given to you today.  *If you need a refill on your cardiac medications before your next appointment, please call your pharmacy*  Lab Work: BMET CBC TODAY If you have labs (blood work) drawn today and your tests are completely normal, you will receive your results only [id number] Message (if you have MyChart) OR  A paper copy in the mail If you have any lab test that is abnormal or we need to change your treatment, we will call you to review the results.  Testing/Procedures: CARDIAC PET SCAN   Follow-Up: At [facility], you and your health needs are our priority.  As part of our continuing mission to provide you with exceptional heart care, we have created designated Provider Care Teams.  These Care Teams include your primary Cardiologist (physician) and Advanced Practice Providers (APPs -  Physician Assistants and Nurse Practitioners) who all work together to provide you with the care you need, when you need it.  Your next appointment:   6 week(s) (MAKE SURE THIS IS AFTER PET SCAN)  Provider:   Little Ishikawa, MD  or  [facility]         Other Instructions NONE

## 2023-10-21 LAB — BASIC METABOLIC PANEL
BUN/Creatinine Ratio: 24 — ABNORMAL HIGH (ref 9–23)
BUN: 15 mg/dL (ref 6–24)
CO2: 21 mmol/L (ref 20–29)
Calcium: 9.2 mg/dL (ref 8.7–10.2)
Chloride: 102 mmol/L (ref 96–106)
Creatinine, Ser: 0.63 mg/dL (ref 0.57–1.00)
Glucose: 129 mg/dL — ABNORMAL HIGH (ref 70–99)
Potassium: 4 mmol/L (ref 3.5–5.2)
Sodium: 138 mmol/L (ref 134–144)
eGFR: 107 mL/min/{1.73_m2} (ref >59)

## 2023-10-21 LAB — CBC
Hematocrit: 51 % — ABNORMAL HIGH (ref 34.0–46.6)
Hemoglobin: 17 g/dL — ABNORMAL HIGH (ref 11.1–15.9)
MCH: 32.1 pg (ref 26.6–33.0)
MCHC: 33.3 g/dL (ref 31.5–35.7)
MCV: 96 fL (ref 79–97)
Platelets: 163 10*3/uL (ref 150–450)
RBC: 5.3 x10E6/uL — ABNORMAL HIGH (ref 3.77–5.28)
RDW: 12.2 % (ref 11.7–15.4)
WBC: 7.9 10*3/uL (ref 3.4–10.8)

## 2023-11-30 NOTE — Progress Notes (Deleted)
 Cardiology Office Note:    Date:  11/30/2023  ID:  Lisa Norton, DOB 06-23-1971, MRN [id number] PCP: Kirt Boys, PA-C  [facility] Providers Cardiologist:  Little Ishikawa, MD { Click to update primary MD,subspecialty MD or APP then REFRESH:1}    {Click to Open Review  :1}   Patient Profile:      Chief Complaint: *** History of Present Illness:  Lisa Norton is a 53 y.o. female with visit-pertinent history of coronary artery disease, type 2 diabetes, hyperlipidemia, hypertension, tobacco use.   She was initially seen for acute visit after found to have DVT on imaging 09/12/2019.  She was found to have acute occlusive thrombus in one of the left peroneal veins.   On 09/12/2019 she was started on Eliquis to complete a 71-month course for distal DVT.  Repeat duplex on 12/18/2019 showed resolution of DVT.  Arterial duplex was also ordered and showed right SFA 50 to 74% stenosis and left SFA total occlusion.  She was seen by Dr. Durwin Nora and vascular surgery who recommended conservative management.  She reported shortness of breath and TEE was done on 12/25/2019 that showed normal biventricular function, grade 1 DD, no significant valvular disease.  She had noted palpitations and Zio patch x 14 days was completed on 08/13/2021 showing no significant arrhythmias.   She was admitted at [facility] in March 2023 with NSTEMI.  She underwent PCI to mid-distal RCA with DES x 3.  Echo 11/05/2021 showed EF 65%, no significant valvular disease.   Seen in clinic on 04/18/2023.  Brilinta was discontinued since it has been over 1 year since PCI.  She was referred to sleep medicine that she was not able to tolerate her CPAP.  It was noted she began smoking cigarettes again.  Medication regimen was continued and she was to follow-up in 6 months.  She was last seen in clinic on 10/20/2023.  Patient had a recent onset of chest pain.  The pain had been intermittent for 6 days located in the left side  of her chest with radiation to the lower part of her throat.  The episodes occur both at rest and on exertion.  Her metoprolol was increased from 12.5 mg twice daily to 25 mg twice daily for antianginal benefit.  Discussed the use of AI scribe software for clinical note transcription with the patient, who gave verbal consent to proceed.  History of Present Illness     Review of systems:  Please see the history of present illness. All other systems are reviewed and otherwise negative. ***     Home Medications:    No outpatient medications have been marked as taking for the 12/01/23 encounter (Appointment) with Denyce Robert, NP.   Studies Reviewed:       *** Risk Assessment/Calculations:   {Does this patient have ATRIAL FIBRILLATION?:[id number]} No BP recorded.  {Refresh Note OR Click here to enter BP  :1}***       Physical Exam:   VS:  LMP 09/07/2017    Wt Readings from Last 3 Encounters:  10/20/23 202 lb (91.6 kg)  04/18/23 198 lb 3.2 oz (89.9 kg)  12/23/22 205 lb (93 kg)    GEN: Well nourished, well developed in no acute distress NECK: No JVD; No carotid bruits CARDIAC: ***RRR, no murmurs, rubs, gallops RESPIRATORY:  Clear to auscultation without rales, wheezing or rhonchi  ABDOMEN: Soft, non-tender, non-distended EXTREMITIES:  No edema; No acute deformity ***     Assessment  and Plan:  Assessment and Plan Assessment & Plan      {Are you ordering a CV Procedure (e.g. stress test, cath, DCCV, TEE, etc)?   Press F2        :[id number]}  Dispo:  No follow-ups on file.  Signed, Denyce Robert, NP

## 2023-12-01 ENCOUNTER — Ambulatory Visit: Payer: Medicare HMO | Attending: Emergency Medicine | Admitting: Emergency Medicine

## 2023-12-01 ENCOUNTER — Telehealth: Payer: Self-pay | Admitting: *Deleted

## 2023-12-01 NOTE — Telephone Encounter (Signed)
 Called patient to reschedule appointment but no answer.

## 2023-12-30 ENCOUNTER — Encounter (HOSPITAL_COMMUNITY): Payer: Self-pay

## 2024-01-02 ENCOUNTER — Ambulatory Visit: Payer: Medicare HMO

## 2024-01-02 ENCOUNTER — Encounter (HOSPITAL_COMMUNITY): Payer: Medicare HMO

## 2024-01-03 ENCOUNTER — Ambulatory Visit (HOSPITAL_COMMUNITY)
Admission: RE | Admit: 2024-01-03 | Discharge: 2024-01-03 | Disposition: A | Discharge status: Home / Self Care | Payer: Medicare HMO | Source: Ambulatory Visit | Attending: Emergency Medicine | Admitting: Emergency Medicine

## 2024-01-03 DIAGNOSIS — I2510 Atherosclerotic heart disease of native coronary artery without angina pectoris: Secondary | ICD-10-CM

## 2024-01-03 DIAGNOSIS — R079 Chest pain, unspecified: Secondary | ICD-10-CM | POA: Diagnosis present

## 2024-01-03 LAB — NM PET CT CARDIAC PERFUSION MULTI W/ABSOLUTE BLOODFLOW
LV dias vol: 69 mL (ref 46–106)
LV sys vol: 24 mL
MBFR: 4
Nuc Rest EF: 65 %
Nuc Stress EF: 68 %
Peak HR: 74 {beats}/min
Rest HR: 60 {beats}/min
Rest MBF: 0.61 ml/g/min
Rest Nuclear Isotope Dose: 23.8 mCi
SDS: 1
SRS: 2
SSS: 3
ST Depression (mm): 0 mm
Stress MBF: 2.44 ml/g/min
Stress Nuclear Isotope Dose: 23.7 mCi

## 2024-01-03 MED ORDER — RUBIDIUM RB82 GENERATOR (RUBYFILL)
23.7300 | PACK | Freq: Once | INTRAVENOUS | Status: AC
  Administered 2024-01-03: 23.73 via INTRAVENOUS

## 2024-01-03 MED ORDER — RUBIDIUM RB82 GENERATOR (RUBYFILL)
23.7600 | PACK | Freq: Once | INTRAVENOUS | Status: AC
  Administered 2024-01-03: 23.76 via INTRAVENOUS

## 2024-01-03 MED ORDER — REGADENOSON 0.4 MG/5ML IV SOLN
0.4000 mg | Freq: Once | INTRAVENOUS | Status: AC
  Administered 2024-01-03: 0.4 mg via INTRAVENOUS

## 2024-01-03 MED ORDER — REGADENOSON 0.4 MG/5ML IV SOLN
INTRAVENOUS | Status: AC
  Filled 2024-01-03: qty 5

## 2024-01-05 ENCOUNTER — Ambulatory Visit: Payer: Medicare HMO | Attending: Cardiology | Admitting: Cardiology

## 2024-01-05 VITALS — BP 132/78 | HR 82 | Ht 61.2 in | Wt 193.2 lb

## 2024-01-05 DIAGNOSIS — F17200 Nicotine dependence, unspecified, uncomplicated: Secondary | ICD-10-CM

## 2024-01-05 DIAGNOSIS — I2510 Atherosclerotic heart disease of native coronary artery without angina pectoris: Secondary | ICD-10-CM | POA: Diagnosis not present

## 2024-01-05 DIAGNOSIS — G4733 Obstructive sleep apnea (adult) (pediatric): Secondary | ICD-10-CM

## 2024-01-05 DIAGNOSIS — I739 Peripheral vascular disease, unspecified: Secondary | ICD-10-CM | POA: Diagnosis not present

## 2024-01-05 DIAGNOSIS — E785 Hyperlipidemia, unspecified: Secondary | ICD-10-CM

## 2024-01-05 DIAGNOSIS — E1169 Type 2 diabetes mellitus with other specified complication: Secondary | ICD-10-CM

## 2024-01-05 MED ORDER — REPATHA PUSHTRONEX SYSTEM 420 MG/3.5ML ~~LOC~~ SOCT: 420.0000 mg | SUBCUTANEOUS | 3 refills | Status: DC

## 2024-01-05 MED ORDER — NITROGLYCERIN 0.4 MG SL SUBL: 0.4000 mg | SUBLINGUAL_TABLET | SUBLINGUAL | 3 refills | Status: AC | PRN

## 2024-01-05 NOTE — Patient Instructions (Signed)
 Medication Instructions:  Continue current medications as discussed with your provider *If you need a refill on your cardiac medications before your next appointment, please call your pharmacy*  Lab Work: Fasting lipid panel in 3 months If you have labs (blood work) drawn today and your tests are completely normal, you will receive your results only by: MyChart Message (if you have MyChart) OR A paper copy in the mail If you have any lab test that is abnormal or we need to change your treatment, we will call you to review the results.  Testing/Procedures: none  Follow-Up: At [facility], you and your health needs are our priority.  As part of our continuing mission to provide you with exceptional heart care, our providers are all part of one team.  This team includes your primary Cardiologist (physician) and Advanced Practice Providers or APPs (Physician Assistants and Nurse Practitioners) who all work together to provide you with the care you need, when you need it.  Your next appointment:   6 month(s)  Provider:   Wendie Hamburg, MD    We recommend signing up for the patient portal called "MyChart".  Sign up information is provided on this After Visit Summary.  MyChart is used to connect with patients for Virtual Visits (Telemedicine).  Patients are able to view lab/test results, encounter notes, upcoming appointments, etc.  Non-urgent messages can be sent to your provider as well.   To learn more about what you can do with MyChart, go to [email or web address].   Other Instructions Referral to [facility]  Call [phone] [phone]
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
## 2024-01-06 ENCOUNTER — Telehealth: Payer: Self-pay | Admitting: Cardiology

## 2024-01-06 DIAGNOSIS — I2510 Atherosclerotic heart disease of native coronary artery without angina pectoris: Secondary | ICD-10-CM

## 2024-01-06 DIAGNOSIS — E785 Hyperlipidemia, unspecified: Secondary | ICD-10-CM

## 2024-01-06 DIAGNOSIS — Z794 Long term (current) use of insulin: Secondary | ICD-10-CM

## 2024-01-06 MED ORDER — REPATHA SURECLICK 140 MG/ML ~~LOC~~ SOAJ: 1.0000 mL | SUBCUTANEOUS | 1 refills | Status: AC

## 2024-01-06 NOTE — Telephone Encounter (Signed)
 Pt c/o medication issue:  1. Name of Medication:   Evolocumab  with Infusor (REPATHA  PUSHTRONEX SYSTEM) 420 MG/3.5ML SOCT    2. How are you currently taking this medication (dosage and times per day)?   3. Are you having a reaction (difficulty breathing--STAT)? No   4. What is your medication issue? Pharmacy called in stating they do not make this strength of Repatha . Please advise.

## 2024-01-06 NOTE — Telephone Encounter (Signed)
 Contacted patient to let her know Repatha  420mg  was discontinued and we can switch her to the pens. Patient does not like self injecting but says she will have someone help her. Rx sent to pharmacy

## 2024-01-18 ENCOUNTER — Encounter (HOSPITAL_BASED_OUTPATIENT_CLINIC_OR_DEPARTMENT_OTHER): Payer: Self-pay

## 2024-01-18 ENCOUNTER — Telehealth: Payer: Self-pay

## 2024-01-18 NOTE — Telephone Encounter (Signed)
 01/18/2024 @ 1315  Patient called because she was concerned about pain in her left leg and foot while she does water aerobics and walks. Patient reported she has no rest pain, no skin color changes, no non-healing ulcers. Pt reports she has intermittent swelling but nothing that prevents her from her normal activities. Pt has ABIs and follow-up with the PA on 02/10/2024.  She reported she will definitely come to her appointment. Pt is also diabetic and reported that she takes great care of her feet.  She also reported that she wants to continue to exercise and "not stop living."  Nurse encouraged patient to continue exercising as usual and to take breaks when necessary.  Patient advised to go to call back or go to the ED if she has sudden increased pain, skin color changes, non-healing wounds, pain while resting.

## 2024-01-31 ENCOUNTER — Other Ambulatory Visit: Payer: Self-pay | Admitting: *Deleted

## 2024-01-31 DIAGNOSIS — I70219 Atherosclerosis of native arteries of extremities with intermittent claudication, unspecified extremity: Secondary | ICD-10-CM

## 2024-01-31 DIAGNOSIS — I739 Peripheral vascular disease, unspecified: Secondary | ICD-10-CM

## 2024-02-01 ENCOUNTER — Ambulatory Visit (HOSPITAL_COMMUNITY)
Admission: RE | Admit: 2024-02-01 | Discharge: 2024-02-01 | Disposition: A | Discharge status: Home / Self Care | Source: Ambulatory Visit | Attending: Vascular Surgery | Admitting: Vascular Surgery

## 2024-02-01 ENCOUNTER — Ambulatory Visit: Attending: Vascular Surgery | Admitting: Physician Assistant

## 2024-02-01 VITALS — BP 124/86 | HR 83 | Temp 97.6°F | Ht 61.2 in | Wt 189.2 lb

## 2024-02-01 DIAGNOSIS — I70219 Atherosclerosis of native arteries of extremities with intermittent claudication, unspecified extremity: Secondary | ICD-10-CM

## 2024-02-01 DIAGNOSIS — I739 Peripheral vascular disease, unspecified: Secondary | ICD-10-CM

## 2024-02-01 LAB — VAS US ABI WITH/WO TBI
Left ABI: 0.77
Right ABI: 1.03

## 2024-02-08 NOTE — Progress Notes (Signed)
 Office Note   History of Present Illness   Lisa Norton is a 53 y.o. (03-01-1971) female who presents for surveillance of PAD.  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  She has no prior history of vascular interventions.  She returns today for follow-up.  She says that she is doing okay.  She continues to have bilateral calf claudication, worse on the left.  She says over the past couple of months her claudication has gotten worse.  She used to be able to get through half of [employer] before her symptoms started.  She can now walk for about 10 minutes before her symptoms start.  She denies any rest pain or tissue loss.  Current Outpatient Medications  Medication Sig Dispense Refill   MOUNJARO 7.5 MG/0.5ML Pen Inject 7.5 mg into the skin.     Accu-Chek FastClix Lancets MISC 4 TIMES DAILY 102 each 5   aspirin  EC 81 MG tablet Take 1 tablet (81 mg total) by mouth daily. 90 tablet 3   atorvastatin  (LIPITOR) 80 MG tablet Take 1 tablet (80 mg total) by mouth daily. 90 tablet 3   clobetasol  ointment (TEMOVATE ) 0.05 % Apply 1 Application topically 2 (two) times daily.     empagliflozin (JARDIANCE) 10 MG TABS tablet Take 10 mg by mouth daily.     EPINEPHrine  0.3 mg/0.3 mL IJ SOAJ injection Inject into the skin.     Evolocumab  (REPATHA  SURECLICK) 140 MG/ML SOAJ Inject 140 mg into the skin every 14 (fourteen) days. 6 mL 1   ezetimibe  (ZETIA ) 10 MG tablet TAKE ONE TABLET BY MOUTH ONCE DAILY 90 tablet 2   famotidine  (PEPCID ) 40 MG tablet Take 40 mg by mouth daily.     fenofibrate  (TRICOR ) 145 MG tablet Take 145 mg by mouth daily.     fluconazole  (DIFLUCAN ) 100 MG tablet Take 100 mg by mouth daily.     gabapentin  (NEURONTIN ) 400 MG capsule Take 400 mg by mouth daily.     glucose blood test strip Use to test BS QID and PRN dx.E11.65 100 each 12   icosapent  Ethyl (VASCEPA ) 1 g capsule Take 2 capsules (2 g total) by mouth 2  (two) times daily. (Patient not taking: Reported on 01/05/2024) 120 capsule 5   insulin  degludec (TRESIBA) 200 UNIT/ML FlexTouch Pen Inject 50 Units into the skin in the morning.     isosorbide mononitrate (IMDUR) 30 MG 24 hr tablet Take 30 mg by mouth daily.     ketoconazole (NIZORAL) 2 % shampoo Apply 1 Application topically 2 (two) times a week.     metoprolol tartrate (LOPRESSOR) 25 MG tablet Take 25 mg by mouth 2 (two) times daily.     naloxone (NARCAN) nasal spray 4 mg/0.1 mL      nitroGLYCERIN  (NITROSTAT ) 0.4 MG SL tablet Place 1 tablet (0.4 mg total) under the tongue every 5 (five) minutes as needed. 25 tablet 3   oxyCODONE -acetaminophen  (PERCOCET) 5-325 MG tablet Take 1 tablet by mouth every 6 (six) hours as needed for severe pain. 30 tablet 0   polyethylene glycol powder (GLYCOLAX /MIRALAX ) 17 GM/SCOOP powder Take 0.5 Containers by mouth as needed for moderate constipation.     topiramate (TOPAMAX) 25 MG tablet Take 25 mg by mouth 2 (two) times daily.     ULTICARE MICRO PEN NEEDLES 32G X 4 MM MISC 3 TIMES DAILY 100 each 11   valACYclovir  (VALTREX ) 1000 MG tablet Take 1  tablet (1,000 mg total) by mouth 2 (two) times daily. 20 tablet 6   varenicline  (CHANTIX ) 1 MG tablet Take 1 tablet (1 mg total) by mouth 2 (two) times daily. 60 tablet 5   No current facility-administered medications for this visit.    REVIEW OF SYSTEMS (negative unless checked):   Cardiac:  []  Chest pain or chest pressure? []  Shortness of breath upon activity? []  Shortness of breath when lying flat? []  Irregular heart rhythm?  Vascular:  []  Pain in calf, thigh, or hip brought on by walking? []  Pain in feet at night that wakes you up from your sleep? []  Blood clot in your veins? []  Leg swelling?  Pulmonary:  []  Oxygen at home? []  Productive cough? []  Wheezing?  Neurologic:  []  Sudden weakness in arms or legs? []  Sudden numbness in arms or legs? []  Sudden onset of difficult speaking or slurred speech? []   Temporary loss of vision in one eye? []  Problems with dizziness?  Gastrointestinal:  []  Blood in stool? []  Vomited blood?  Genitourinary:  []  Burning when urinating? []  Blood in urine?  Psychiatric:  []  Major depression  Hematologic:  []  Bleeding problems? []  Problems with blood clotting?  Dermatologic:  []  Rashes or ulcers?  Constitutional:  []  Fever or chills?  Ear/Nose/Throat:  []  Change in hearing? []  Nose bleeds? []  Sore throat?  Musculoskeletal:  []  Back pain? []  Joint pain? []  Muscle pain?   Physical Examination    Vitals:   02/01/24 1231  BP: 124/86  Pulse: 83  Temp: 97.6 F (36.4 C)  TempSrc: Temporal  SpO2: 96%  Weight: 189 lb 3.2 oz (85.8 kg)  Height: 5' 1.2 (1.554 m)   Body mass index is 35.52 kg/m.  General:  WDWN in NAD; vital signs documented above Gait: Not observed HENT: WNL, normocephalic Pulmonary: normal non-labored breathing , without rales, rhonchi,  wheezing Cardiac: Regular Abdomen: soft, NT, no masses Skin: without rashes Vascular Exam/Pulses: Palpable right DP pulse.  Nonpalpable left pedal pulses Extremities: without ischemic changes, without gangrene , without cellulitis; without open wounds;  Musculoskeletal: no muscle wasting or atrophy  Neurologic: A&O X 3;  No focal weakness or paresthesias are detected Psychiatric:  The pt has Normal affect.  Non-Invasive Vascular imaging   ABI (02/01/2024) R:  ABI: 1.03 (1.04),  PT: bi DP: bi TBI: 0.68 L:  ABI: 0.77 (0.71),  PT: mono DP: mono TBI: 0.65   Medical Decision Making   Lisa Norton is a 53 y.o. female who presents for surveillance of PAD  Based on the patient's vascular studies, her ABIs appear essentially unchanged from her last visit.  Her right ABI is 1.03 and left ABI is 0.77 She has a history of bilateral calf claudication, worse on the left.  She states over the past couple of months her claudication has worsened bilaterally.  This is not lifestyle  limiting, however she does feel frustrated about her symptoms.  She denies any rest pain or tissue loss On exam she has nonpalpable left pedal pulses.  She does have a palpable right DP pulse I have explained to the patient that she currently has no urgent indications for revascularization.  Given that her claudication is not lifestyle limiting, we would prefer to avoid intervention.  She has no evidence of critical limb ischemia.  She will continue to walk as tolerated in hopes to improve her symptoms She can follow-up with our office in 1 year with repeat ABIs   Deneise Finlay PA-C [facility] Office: [phone]  Clinic MD: Vikki Graves

## 2024-02-10 ENCOUNTER — Ambulatory Visit

## 2024-02-10 ENCOUNTER — Encounter (HOSPITAL_COMMUNITY)

## 2024-02-24 ENCOUNTER — Ambulatory Visit

## 2024-02-24 ENCOUNTER — Encounter (HOSPITAL_COMMUNITY)

## 2024-02-29 ENCOUNTER — Ambulatory Visit

## 2024-02-29 ENCOUNTER — Encounter (HOSPITAL_COMMUNITY)

## 2024-05-29 ENCOUNTER — Telehealth: Payer: Self-pay | Admitting: Cardiology

## 2024-05-29 MED ORDER — ISOSORBIDE MONONITRATE ER 30 MG PO TB24: 30.0000 mg | ORAL_TABLET | Freq: Every day | ORAL | 2 refills | Status: AC

## 2024-05-29 NOTE — Telephone Encounter (Signed)
*  STAT* If patient is at the pharmacy, call can be transferred to refill team.   1. Which medications need to be refilled? (please list name of each medication and dose if known)   isosorbide mononitrate (IMDUR) 30 MG 24 hr tablet     2. Would you like to learn more about the convenience, safety, & potential cost savings by using the [facility]? no   3. Are you open to using the [facility] (Type [facility].  ). no   4. Which pharmacy/location (including street and city if local pharmacy) is medication to be sent to? [facility] - [place], [place] - [address]      5. Do they  need a 30 day or 90 day supply? 30 day supply

## 2024-05-29 NOTE — Telephone Encounter (Signed)
 Pt's medication was sent to pt's pharmacy as requested. Confirmation received.

## 2024-06-20 LAB — LIPID PANEL
Chol/HDL Ratio: 7 ratio — ABNORMAL HIGH (ref 0.0–4.4)
Cholesterol, Total: 148 mg/dL (ref 100–199)
HDL: 21 mg/dL — ABNORMAL LOW
LDL Chol Calc (NIH): 102 mg/dL — ABNORMAL HIGH (ref 0–99)
Triglycerides: 135 mg/dL (ref 0–149)
VLDL Cholesterol Cal: 25 mg/dL (ref 5–40)

## 2024-06-21 ENCOUNTER — Ambulatory Visit: Payer: Self-pay | Admitting: Cardiology

## 2024-09-07 ENCOUNTER — Ambulatory Visit: Attending: Cardiology | Admitting: Cardiology

## 2024-09-07 ENCOUNTER — Encounter: Payer: Self-pay | Admitting: Cardiology

## 2024-09-07 VITALS — BP 134/72 | HR 82 | Resp 16 | Ht 61.0 in | Wt 180.2 lb

## 2024-09-07 DIAGNOSIS — E785 Hyperlipidemia, unspecified: Secondary | ICD-10-CM

## 2024-09-07 DIAGNOSIS — I2510 Atherosclerotic heart disease of native coronary artery without angina pectoris: Secondary | ICD-10-CM

## 2024-09-07 DIAGNOSIS — I739 Peripheral vascular disease, unspecified: Secondary | ICD-10-CM | POA: Diagnosis not present

## 2024-09-07 DIAGNOSIS — G4733 Obstructive sleep apnea (adult) (pediatric): Secondary | ICD-10-CM

## 2024-09-07 DIAGNOSIS — Z720 Tobacco use: Secondary | ICD-10-CM | POA: Diagnosis not present

## 2024-09-07 DIAGNOSIS — I10 Essential (primary) hypertension: Secondary | ICD-10-CM

## 2024-09-07 MED ORDER — METOPROLOL SUCCINATE ER 25 MG PO TB24: 25.0000 mg | ORAL_TABLET | Freq: Every day | ORAL | 3 refills | Status: AC
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
## 2024-09-07 NOTE — Patient Instructions (Signed)
 Medication Instructions:  Stop Metoprolol  as directed by your provider Start Toprol  XL 25 mg daily *If you need a refill on your cardiac medications before your next appointment, please call your pharmacy*  Lab Work: none If you have labs (blood work) drawn today and your tests are completely normal, you will receive your results only by: MyChart Message (if you have MyChart) OR A paper copy in the mail If you have any lab test that is abnormal or we need to change your treatment, we will call you to review the results.  Testing/Procedures: none  Follow-Up: At [facility], you and your health needs are our priority.  As part of our continuing mission to provide you with exceptional heart care, our providers are all part of one team.  This team includes your primary Cardiologist (physician) and Advanced Practice Providers or APPs (Physician Assistants and Nurse Practitioners) who all work together to provide you with the care you need, when you need it.  Your next appointment:   6 months  Provider:   Dr. Kate  We recommend signing up for the patient portal called MyChart.  Sign up information is provided on this After Visit Summary.  MyChart is used to connect with patients for Virtual Visits (Telemedicine).  Patients are able to view lab/test results, encounter notes, upcoming appointments, etc.  Non-urgent messages can be sent to your provider as well.   To learn more about what you can do with MyChart, go to [email or web address].   Other Instructions Referral to [facility] Referral to [facility] Pulmonology

## 2024-10-01 ENCOUNTER — Telehealth: Payer: Self-pay

## 2024-10-01 ENCOUNTER — Ambulatory Visit

## 2024-10-01 NOTE — Telephone Encounter (Signed)
 Called patient to reschedule PharmD visit due to weather/office delayed opening. NR-LVM requesting call back
